# Patient Record
Sex: Female | Born: 1935 | Race: White | Hispanic: No | Smoking: Never smoker
Health system: Southern US, Community
[De-identification: ages and names within clinical notes are randomized; demographics above are authoritative.]

## PROBLEM LIST (undated history)

## (undated) DIAGNOSIS — F329 Major depressive disorder, single episode, unspecified: Secondary | ICD-10-CM

## (undated) DIAGNOSIS — Z860100 Personal history of colon polyps, unspecified: Secondary | ICD-10-CM

## (undated) DIAGNOSIS — E119 Type 2 diabetes mellitus without complications: Secondary | ICD-10-CM

## (undated) DIAGNOSIS — K219 Gastro-esophageal reflux disease without esophagitis: Secondary | ICD-10-CM

## (undated) DIAGNOSIS — E7800 Pure hypercholesterolemia, unspecified: Secondary | ICD-10-CM

## (undated) DIAGNOSIS — I739 Peripheral vascular disease, unspecified: Secondary | ICD-10-CM

## (undated) DIAGNOSIS — M109 Gout, unspecified: Secondary | ICD-10-CM

## (undated) DIAGNOSIS — E039 Hypothyroidism, unspecified: Secondary | ICD-10-CM

## (undated) DIAGNOSIS — J45909 Unspecified asthma, uncomplicated: Secondary | ICD-10-CM

## (undated) DIAGNOSIS — S42309A Unspecified fracture of shaft of humerus, unspecified arm, initial encounter for closed fracture: Secondary | ICD-10-CM

## (undated) DIAGNOSIS — F32A Depression, unspecified: Secondary | ICD-10-CM

## (undated) DIAGNOSIS — G4730 Sleep apnea, unspecified: Secondary | ICD-10-CM

## (undated) DIAGNOSIS — I10 Essential (primary) hypertension: Secondary | ICD-10-CM

## (undated) DIAGNOSIS — J449 Chronic obstructive pulmonary disease, unspecified: Secondary | ICD-10-CM

## (undated) DIAGNOSIS — J42 Unspecified chronic bronchitis: Secondary | ICD-10-CM

## (undated) DIAGNOSIS — Z87440 Personal history of urinary (tract) infections: Secondary | ICD-10-CM

## (undated) DIAGNOSIS — R319 Hematuria, unspecified: Secondary | ICD-10-CM

## (undated) HISTORY — DX: Personal history of colonic polyps: Z86.010

## (undated) HISTORY — DX: Gastro-esophageal reflux disease without esophagitis: K21.9

## (undated) HISTORY — PX: COLONOSCOPY W/ POLYPECTOMY: SHX1380

## (undated) HISTORY — DX: Unspecified fracture of shaft of humerus, unspecified arm, initial encounter for closed fracture: S42.309A

## (undated) HISTORY — DX: Type 2 diabetes mellitus without complications: E11.9

## (undated) HISTORY — PX: EYE SURGERY: SHX253

## (undated) HISTORY — DX: Pure hypercholesterolemia, unspecified: E78.00

## (undated) HISTORY — DX: Gout, unspecified: M10.9

## (undated) HISTORY — PX: BREAST BIOPSY: SHX20

## (undated) HISTORY — DX: Personal history of urinary (tract) infections: Z87.440

## (undated) HISTORY — DX: Hematuria, unspecified: R31.9

## (undated) HISTORY — DX: Peripheral vascular disease, unspecified: I73.9

## (undated) HISTORY — DX: Essential (primary) hypertension: I10

## (undated) HISTORY — DX: Personal history of colon polyps, unspecified: Z86.0100

---

## 1992-05-24 HISTORY — PX: CHOLECYSTECTOMY: SHX55

## 1999-12-08 HISTORY — PX: BLADDER SUSPENSION: SHX72

## 2001-02-09 LAB — HM COLONOSCOPY

## 2004-04-30 ENCOUNTER — Ambulatory Visit: Payer: Self-pay | Admitting: Internal Medicine

## 2005-06-09 ENCOUNTER — Ambulatory Visit: Payer: Self-pay | Admitting: Internal Medicine

## 2006-06-30 ENCOUNTER — Ambulatory Visit: Payer: Self-pay | Admitting: Internal Medicine

## 2006-08-13 ENCOUNTER — Emergency Department: Payer: Self-pay | Admitting: Emergency Medicine

## 2007-01-19 ENCOUNTER — Ambulatory Visit: Payer: Self-pay | Admitting: Specialist

## 2007-07-04 ENCOUNTER — Ambulatory Visit: Payer: Self-pay | Admitting: Internal Medicine

## 2007-12-05 ENCOUNTER — Ambulatory Visit: Payer: Self-pay | Admitting: Specialist

## 2008-01-23 DIAGNOSIS — S42309A Unspecified fracture of shaft of humerus, unspecified arm, initial encounter for closed fracture: Secondary | ICD-10-CM

## 2008-01-23 HISTORY — DX: Unspecified fracture of shaft of humerus, unspecified arm, initial encounter for closed fracture: S42.309A

## 2008-01-30 ENCOUNTER — Ambulatory Visit: Payer: Self-pay | Admitting: Internal Medicine

## 2008-07-25 ENCOUNTER — Ambulatory Visit: Payer: Self-pay | Admitting: Internal Medicine

## 2009-12-16 ENCOUNTER — Emergency Department: Payer: Self-pay | Admitting: Emergency Medicine

## 2010-03-03 ENCOUNTER — Ambulatory Visit: Payer: Self-pay | Admitting: Internal Medicine

## 2010-11-30 ENCOUNTER — Ambulatory Visit: Payer: Self-pay | Admitting: Internal Medicine

## 2011-03-22 DIAGNOSIS — N3946 Mixed incontinence: Secondary | ICD-10-CM | POA: Insufficient documentation

## 2011-03-22 DIAGNOSIS — N952 Postmenopausal atrophic vaginitis: Secondary | ICD-10-CM | POA: Insufficient documentation

## 2011-05-25 HISTORY — PX: KNEE SURGERY: SHX244

## 2011-05-31 LAB — HM PAP SMEAR: HM Pap smear: NORMAL

## 2011-06-01 ENCOUNTER — Ambulatory Visit: Payer: Self-pay | Admitting: Internal Medicine

## 2011-06-01 DIAGNOSIS — R928 Other abnormal and inconclusive findings on diagnostic imaging of breast: Secondary | ICD-10-CM | POA: Diagnosis not present

## 2011-06-01 DIAGNOSIS — Z1231 Encounter for screening mammogram for malignant neoplasm of breast: Secondary | ICD-10-CM | POA: Diagnosis not present

## 2011-06-03 DIAGNOSIS — R159 Full incontinence of feces: Secondary | ICD-10-CM | POA: Insufficient documentation

## 2011-06-03 DIAGNOSIS — N390 Urinary tract infection, site not specified: Secondary | ICD-10-CM | POA: Diagnosis not present

## 2011-06-03 DIAGNOSIS — R150 Incomplete defecation: Secondary | ICD-10-CM | POA: Diagnosis not present

## 2011-06-10 DIAGNOSIS — E7800 Pure hypercholesterolemia, unspecified: Secondary | ICD-10-CM | POA: Diagnosis not present

## 2011-06-10 DIAGNOSIS — N289 Disorder of kidney and ureter, unspecified: Secondary | ICD-10-CM | POA: Diagnosis not present

## 2011-06-10 DIAGNOSIS — Z124 Encounter for screening for malignant neoplasm of cervix: Secondary | ICD-10-CM | POA: Diagnosis not present

## 2011-06-10 DIAGNOSIS — Z0000 Encounter for general adult medical examination without abnormal findings: Secondary | ICD-10-CM | POA: Diagnosis not present

## 2011-06-10 DIAGNOSIS — R0602 Shortness of breath: Secondary | ICD-10-CM | POA: Diagnosis not present

## 2011-06-10 DIAGNOSIS — E119 Type 2 diabetes mellitus without complications: Secondary | ICD-10-CM | POA: Diagnosis not present

## 2011-06-10 DIAGNOSIS — I10 Essential (primary) hypertension: Secondary | ICD-10-CM | POA: Diagnosis not present

## 2011-06-14 ENCOUNTER — Ambulatory Visit: Payer: Self-pay | Admitting: Internal Medicine

## 2011-06-14 DIAGNOSIS — R928 Other abnormal and inconclusive findings on diagnostic imaging of breast: Secondary | ICD-10-CM | POA: Diagnosis not present

## 2011-06-14 DIAGNOSIS — N6009 Solitary cyst of unspecified breast: Secondary | ICD-10-CM | POA: Diagnosis not present

## 2011-06-17 DIAGNOSIS — R0602 Shortness of breath: Secondary | ICD-10-CM | POA: Diagnosis not present

## 2011-06-24 DIAGNOSIS — I10 Essential (primary) hypertension: Secondary | ICD-10-CM | POA: Diagnosis not present

## 2011-06-24 DIAGNOSIS — R05 Cough: Secondary | ICD-10-CM | POA: Diagnosis not present

## 2011-06-24 DIAGNOSIS — J449 Chronic obstructive pulmonary disease, unspecified: Secondary | ICD-10-CM | POA: Diagnosis not present

## 2011-06-24 DIAGNOSIS — J069 Acute upper respiratory infection, unspecified: Secondary | ICD-10-CM | POA: Diagnosis not present

## 2011-06-28 DIAGNOSIS — Z1211 Encounter for screening for malignant neoplasm of colon: Secondary | ICD-10-CM | POA: Diagnosis not present

## 2011-07-13 DIAGNOSIS — R0602 Shortness of breath: Secondary | ICD-10-CM | POA: Diagnosis not present

## 2011-07-13 DIAGNOSIS — R05 Cough: Secondary | ICD-10-CM | POA: Diagnosis not present

## 2011-07-13 DIAGNOSIS — J45909 Unspecified asthma, uncomplicated: Secondary | ICD-10-CM | POA: Diagnosis not present

## 2011-07-27 DIAGNOSIS — R498 Other voice and resonance disorders: Secondary | ICD-10-CM | POA: Diagnosis not present

## 2011-07-27 DIAGNOSIS — J45909 Unspecified asthma, uncomplicated: Secondary | ICD-10-CM | POA: Diagnosis not present

## 2011-07-27 DIAGNOSIS — J310 Chronic rhinitis: Secondary | ICD-10-CM | POA: Diagnosis not present

## 2011-08-02 DIAGNOSIS — R3989 Other symptoms and signs involving the genitourinary system: Secondary | ICD-10-CM | POA: Diagnosis not present

## 2011-08-02 DIAGNOSIS — N390 Urinary tract infection, site not specified: Secondary | ICD-10-CM | POA: Diagnosis not present

## 2011-08-02 DIAGNOSIS — Z87440 Personal history of urinary (tract) infections: Secondary | ICD-10-CM | POA: Diagnosis not present

## 2011-09-09 DIAGNOSIS — E7800 Pure hypercholesterolemia, unspecified: Secondary | ICD-10-CM | POA: Diagnosis not present

## 2011-09-09 DIAGNOSIS — D510 Vitamin B12 deficiency anemia due to intrinsic factor deficiency: Secondary | ICD-10-CM | POA: Diagnosis not present

## 2011-09-09 DIAGNOSIS — E039 Hypothyroidism, unspecified: Secondary | ICD-10-CM | POA: Diagnosis not present

## 2011-09-09 DIAGNOSIS — E119 Type 2 diabetes mellitus without complications: Secondary | ICD-10-CM | POA: Diagnosis not present

## 2011-09-09 DIAGNOSIS — I10 Essential (primary) hypertension: Secondary | ICD-10-CM | POA: Diagnosis not present

## 2011-09-10 DIAGNOSIS — E119 Type 2 diabetes mellitus without complications: Secondary | ICD-10-CM | POA: Diagnosis not present

## 2011-09-16 DIAGNOSIS — N289 Disorder of kidney and ureter, unspecified: Secondary | ICD-10-CM | POA: Diagnosis not present

## 2011-09-16 DIAGNOSIS — M109 Gout, unspecified: Secondary | ICD-10-CM | POA: Diagnosis not present

## 2011-09-16 DIAGNOSIS — G609 Hereditary and idiopathic neuropathy, unspecified: Secondary | ICD-10-CM | POA: Diagnosis not present

## 2011-09-16 DIAGNOSIS — I10 Essential (primary) hypertension: Secondary | ICD-10-CM | POA: Diagnosis not present

## 2011-09-16 DIAGNOSIS — E7800 Pure hypercholesterolemia, unspecified: Secondary | ICD-10-CM | POA: Diagnosis not present

## 2011-10-12 DIAGNOSIS — M1710 Unilateral primary osteoarthritis, unspecified knee: Secondary | ICD-10-CM | POA: Diagnosis not present

## 2011-10-14 DIAGNOSIS — D510 Vitamin B12 deficiency anemia due to intrinsic factor deficiency: Secondary | ICD-10-CM | POA: Diagnosis not present

## 2011-10-28 DIAGNOSIS — M1710 Unilateral primary osteoarthritis, unspecified knee: Secondary | ICD-10-CM | POA: Diagnosis not present

## 2011-11-09 DIAGNOSIS — M1710 Unilateral primary osteoarthritis, unspecified knee: Secondary | ICD-10-CM | POA: Diagnosis not present

## 2011-11-11 ENCOUNTER — Ambulatory Visit: Payer: Self-pay | Admitting: Unknown Physician Specialty

## 2011-11-11 DIAGNOSIS — M25569 Pain in unspecified knee: Secondary | ICD-10-CM | POA: Diagnosis not present

## 2011-11-11 DIAGNOSIS — IMO0002 Reserved for concepts with insufficient information to code with codable children: Secondary | ICD-10-CM | POA: Diagnosis not present

## 2011-11-30 DIAGNOSIS — D510 Vitamin B12 deficiency anemia due to intrinsic factor deficiency: Secondary | ICD-10-CM | POA: Diagnosis not present

## 2011-12-15 ENCOUNTER — Ambulatory Visit: Payer: Self-pay | Admitting: Internal Medicine

## 2011-12-15 DIAGNOSIS — R928 Other abnormal and inconclusive findings on diagnostic imaging of breast: Secondary | ICD-10-CM | POA: Diagnosis not present

## 2012-01-04 DIAGNOSIS — D510 Vitamin B12 deficiency anemia due to intrinsic factor deficiency: Secondary | ICD-10-CM | POA: Diagnosis not present

## 2012-01-07 DIAGNOSIS — J45909 Unspecified asthma, uncomplicated: Secondary | ICD-10-CM | POA: Diagnosis not present

## 2012-01-07 DIAGNOSIS — Z01811 Encounter for preprocedural respiratory examination: Secondary | ICD-10-CM | POA: Diagnosis not present

## 2012-01-11 DIAGNOSIS — I10 Essential (primary) hypertension: Secondary | ICD-10-CM | POA: Diagnosis not present

## 2012-01-11 DIAGNOSIS — E7800 Pure hypercholesterolemia, unspecified: Secondary | ICD-10-CM | POA: Diagnosis not present

## 2012-01-11 DIAGNOSIS — Z79899 Other long term (current) drug therapy: Secondary | ICD-10-CM | POA: Diagnosis not present

## 2012-01-11 DIAGNOSIS — E119 Type 2 diabetes mellitus without complications: Secondary | ICD-10-CM | POA: Diagnosis not present

## 2012-01-13 ENCOUNTER — Ambulatory Visit: Payer: Self-pay | Admitting: Unknown Physician Specialty

## 2012-01-13 DIAGNOSIS — Z01810 Encounter for preprocedural cardiovascular examination: Secondary | ICD-10-CM | POA: Diagnosis not present

## 2012-01-13 DIAGNOSIS — Z01812 Encounter for preprocedural laboratory examination: Secondary | ICD-10-CM | POA: Diagnosis not present

## 2012-01-13 DIAGNOSIS — M25569 Pain in unspecified knee: Secondary | ICD-10-CM | POA: Diagnosis not present

## 2012-01-13 DIAGNOSIS — I10 Essential (primary) hypertension: Secondary | ICD-10-CM | POA: Diagnosis not present

## 2012-01-13 LAB — CBC
HCT: 38.6 % (ref 35.0–47.0)
MCH: 32.4 pg (ref 26.0–34.0)
MCHC: 32.6 g/dL (ref 32.0–36.0)
RDW: 14.7 % — ABNORMAL HIGH (ref 11.5–14.5)

## 2012-01-13 LAB — URINALYSIS, COMPLETE
Bacteria: NONE SEEN
Bilirubin,UR: NEGATIVE
Blood: NEGATIVE
Hyaline Cast: 1
Ketone: NEGATIVE
Ph: 6 (ref 4.5–8.0)
Protein: NEGATIVE
Specific Gravity: 1.014 (ref 1.003–1.030)
Squamous Epithelial: <1
WBC UR: 239 /HPF (ref 0–5)

## 2012-01-19 ENCOUNTER — Ambulatory Visit: Payer: Self-pay | Admitting: Unknown Physician Specialty

## 2012-01-19 DIAGNOSIS — R609 Edema, unspecified: Secondary | ICD-10-CM | POA: Diagnosis not present

## 2012-01-19 DIAGNOSIS — M545 Low back pain: Secondary | ICD-10-CM | POA: Diagnosis not present

## 2012-01-19 DIAGNOSIS — N289 Disorder of kidney and ureter, unspecified: Secondary | ICD-10-CM | POA: Diagnosis not present

## 2012-01-19 DIAGNOSIS — D510 Vitamin B12 deficiency anemia due to intrinsic factor deficiency: Secondary | ICD-10-CM | POA: Diagnosis not present

## 2012-01-19 DIAGNOSIS — I739 Peripheral vascular disease, unspecified: Secondary | ICD-10-CM | POA: Diagnosis not present

## 2012-01-19 DIAGNOSIS — M84469A Pathological fracture, unspecified tibia and fibula, initial encounter for fracture: Secondary | ICD-10-CM | POA: Diagnosis not present

## 2012-01-19 DIAGNOSIS — E119 Type 2 diabetes mellitus without complications: Secondary | ICD-10-CM | POA: Diagnosis not present

## 2012-01-19 DIAGNOSIS — M23329 Other meniscus derangements, posterior horn of medial meniscus, unspecified knee: Secondary | ICD-10-CM | POA: Diagnosis not present

## 2012-01-19 DIAGNOSIS — E7800 Pure hypercholesterolemia, unspecified: Secondary | ICD-10-CM | POA: Diagnosis not present

## 2012-01-19 DIAGNOSIS — I10 Essential (primary) hypertension: Secondary | ICD-10-CM | POA: Diagnosis not present

## 2012-01-19 DIAGNOSIS — K449 Diaphragmatic hernia without obstruction or gangrene: Secondary | ICD-10-CM | POA: Diagnosis not present

## 2012-01-19 DIAGNOSIS — M76899 Other specified enthesopathies of unspecified lower limb, excluding foot: Secondary | ICD-10-CM | POA: Diagnosis not present

## 2012-01-19 DIAGNOSIS — Z7983 Long term (current) use of bisphosphonates: Secondary | ICD-10-CM | POA: Diagnosis not present

## 2012-01-19 DIAGNOSIS — IMO0002 Reserved for concepts with insufficient information to code with codable children: Secondary | ICD-10-CM | POA: Diagnosis not present

## 2012-01-19 DIAGNOSIS — G4730 Sleep apnea, unspecified: Secondary | ICD-10-CM | POA: Diagnosis not present

## 2012-01-19 DIAGNOSIS — J45909 Unspecified asthma, uncomplicated: Secondary | ICD-10-CM | POA: Diagnosis not present

## 2012-01-19 DIAGNOSIS — Z8711 Personal history of peptic ulcer disease: Secondary | ICD-10-CM | POA: Diagnosis not present

## 2012-01-19 DIAGNOSIS — K219 Gastro-esophageal reflux disease without esophagitis: Secondary | ICD-10-CM | POA: Diagnosis not present

## 2012-01-27 DIAGNOSIS — M23329 Other meniscus derangements, posterior horn of medial meniscus, unspecified knee: Secondary | ICD-10-CM | POA: Diagnosis not present

## 2012-02-01 DIAGNOSIS — M23329 Other meniscus derangements, posterior horn of medial meniscus, unspecified knee: Secondary | ICD-10-CM | POA: Diagnosis not present

## 2012-02-08 DIAGNOSIS — Z23 Encounter for immunization: Secondary | ICD-10-CM | POA: Diagnosis not present

## 2012-02-08 DIAGNOSIS — D510 Vitamin B12 deficiency anemia due to intrinsic factor deficiency: Secondary | ICD-10-CM | POA: Diagnosis not present

## 2012-02-21 DIAGNOSIS — M23302 Other meniscus derangements, unspecified lateral meniscus, unspecified knee: Secondary | ICD-10-CM | POA: Diagnosis not present

## 2012-02-21 DIAGNOSIS — M159 Polyosteoarthritis, unspecified: Secondary | ICD-10-CM | POA: Diagnosis not present

## 2012-02-21 DIAGNOSIS — R262 Difficulty in walking, not elsewhere classified: Secondary | ICD-10-CM | POA: Diagnosis not present

## 2012-02-21 DIAGNOSIS — M25569 Pain in unspecified knee: Secondary | ICD-10-CM | POA: Diagnosis not present

## 2012-02-22 DIAGNOSIS — M159 Polyosteoarthritis, unspecified: Secondary | ICD-10-CM | POA: Diagnosis not present

## 2012-02-22 DIAGNOSIS — R262 Difficulty in walking, not elsewhere classified: Secondary | ICD-10-CM | POA: Diagnosis not present

## 2012-02-22 DIAGNOSIS — M25569 Pain in unspecified knee: Secondary | ICD-10-CM | POA: Diagnosis not present

## 2012-02-22 DIAGNOSIS — M23302 Other meniscus derangements, unspecified lateral meniscus, unspecified knee: Secondary | ICD-10-CM | POA: Diagnosis not present

## 2012-03-03 DIAGNOSIS — Z298 Encounter for other specified prophylactic measures: Secondary | ICD-10-CM | POA: Diagnosis not present

## 2012-03-03 DIAGNOSIS — Z87440 Personal history of urinary (tract) infections: Secondary | ICD-10-CM | POA: Diagnosis not present

## 2012-03-03 DIAGNOSIS — N390 Urinary tract infection, site not specified: Secondary | ICD-10-CM | POA: Diagnosis not present

## 2012-03-24 DIAGNOSIS — M25569 Pain in unspecified knee: Secondary | ICD-10-CM | POA: Diagnosis not present

## 2012-03-24 DIAGNOSIS — M159 Polyosteoarthritis, unspecified: Secondary | ICD-10-CM | POA: Diagnosis not present

## 2012-03-24 DIAGNOSIS — R262 Difficulty in walking, not elsewhere classified: Secondary | ICD-10-CM | POA: Diagnosis not present

## 2012-03-24 DIAGNOSIS — N390 Urinary tract infection, site not specified: Secondary | ICD-10-CM | POA: Diagnosis not present

## 2012-03-24 DIAGNOSIS — M23302 Other meniscus derangements, unspecified lateral meniscus, unspecified knee: Secondary | ICD-10-CM | POA: Diagnosis not present

## 2012-03-27 DIAGNOSIS — M25569 Pain in unspecified knee: Secondary | ICD-10-CM | POA: Diagnosis not present

## 2012-03-27 DIAGNOSIS — R262 Difficulty in walking, not elsewhere classified: Secondary | ICD-10-CM | POA: Diagnosis not present

## 2012-03-27 DIAGNOSIS — M159 Polyosteoarthritis, unspecified: Secondary | ICD-10-CM | POA: Diagnosis not present

## 2012-03-27 DIAGNOSIS — M23302 Other meniscus derangements, unspecified lateral meniscus, unspecified knee: Secondary | ICD-10-CM | POA: Diagnosis not present

## 2012-03-29 DIAGNOSIS — R262 Difficulty in walking, not elsewhere classified: Secondary | ICD-10-CM | POA: Diagnosis not present

## 2012-03-29 DIAGNOSIS — M23302 Other meniscus derangements, unspecified lateral meniscus, unspecified knee: Secondary | ICD-10-CM | POA: Diagnosis not present

## 2012-03-29 DIAGNOSIS — M25569 Pain in unspecified knee: Secondary | ICD-10-CM | POA: Diagnosis not present

## 2012-03-29 DIAGNOSIS — M159 Polyosteoarthritis, unspecified: Secondary | ICD-10-CM | POA: Diagnosis not present

## 2012-04-03 DIAGNOSIS — M23302 Other meniscus derangements, unspecified lateral meniscus, unspecified knee: Secondary | ICD-10-CM | POA: Diagnosis not present

## 2012-04-03 DIAGNOSIS — M25569 Pain in unspecified knee: Secondary | ICD-10-CM | POA: Diagnosis not present

## 2012-04-03 DIAGNOSIS — M159 Polyosteoarthritis, unspecified: Secondary | ICD-10-CM | POA: Diagnosis not present

## 2012-04-03 DIAGNOSIS — R262 Difficulty in walking, not elsewhere classified: Secondary | ICD-10-CM | POA: Diagnosis not present

## 2012-04-05 DIAGNOSIS — M25569 Pain in unspecified knee: Secondary | ICD-10-CM | POA: Diagnosis not present

## 2012-04-05 DIAGNOSIS — M159 Polyosteoarthritis, unspecified: Secondary | ICD-10-CM | POA: Diagnosis not present

## 2012-04-05 DIAGNOSIS — R262 Difficulty in walking, not elsewhere classified: Secondary | ICD-10-CM | POA: Diagnosis not present

## 2012-04-05 DIAGNOSIS — M23302 Other meniscus derangements, unspecified lateral meniscus, unspecified knee: Secondary | ICD-10-CM | POA: Diagnosis not present

## 2012-04-06 ENCOUNTER — Telehealth: Payer: Self-pay | Admitting: *Deleted

## 2012-04-06 NOTE — Telephone Encounter (Signed)
Called refill into pharmacy and left voice message on patients home phone.

## 2012-04-06 NOTE — Telephone Encounter (Signed)
Ok to refill simvastatin #30 with 2 refills.

## 2012-04-06 NOTE — Telephone Encounter (Signed)
Patient has a new patient appointment in January and her pharmacy is requesting a refill authorization for Simvastatin 20 mg.

## 2012-04-07 DIAGNOSIS — N390 Urinary tract infection, site not specified: Secondary | ICD-10-CM | POA: Diagnosis not present

## 2012-04-10 DIAGNOSIS — M23302 Other meniscus derangements, unspecified lateral meniscus, unspecified knee: Secondary | ICD-10-CM | POA: Diagnosis not present

## 2012-04-10 DIAGNOSIS — M25569 Pain in unspecified knee: Secondary | ICD-10-CM | POA: Diagnosis not present

## 2012-04-10 DIAGNOSIS — R262 Difficulty in walking, not elsewhere classified: Secondary | ICD-10-CM | POA: Diagnosis not present

## 2012-04-10 DIAGNOSIS — M159 Polyosteoarthritis, unspecified: Secondary | ICD-10-CM | POA: Diagnosis not present

## 2012-04-12 ENCOUNTER — Telehealth: Payer: Self-pay | Admitting: Internal Medicine

## 2012-04-12 DIAGNOSIS — M23302 Other meniscus derangements, unspecified lateral meniscus, unspecified knee: Secondary | ICD-10-CM | POA: Diagnosis not present

## 2012-04-12 DIAGNOSIS — M159 Polyosteoarthritis, unspecified: Secondary | ICD-10-CM | POA: Diagnosis not present

## 2012-04-12 DIAGNOSIS — R262 Difficulty in walking, not elsewhere classified: Secondary | ICD-10-CM | POA: Diagnosis not present

## 2012-04-12 DIAGNOSIS — M25569 Pain in unspecified knee: Secondary | ICD-10-CM | POA: Diagnosis not present

## 2012-04-12 NOTE — Telephone Encounter (Signed)
Refill request for montelukast sod 10 mg tab Sig: take 1 tablet by mouth every night Patient has an appointment on 05/25/12

## 2012-04-17 ENCOUNTER — Other Ambulatory Visit: Payer: Self-pay | Admitting: *Deleted

## 2012-04-18 MED ORDER — MONTELUKAST SODIUM 10 MG PO TABS: 10.0000 mg | ORAL_TABLET | Freq: Every day | ORAL | Status: DC

## 2012-04-18 MED ORDER — SIMVASTATIN 20 MG PO TABS: 20.0000 mg | ORAL_TABLET | Freq: Every day | ORAL | Status: DC

## 2012-04-18 NOTE — Telephone Encounter (Signed)
Sent in to pharmacy.  

## 2012-05-08 DIAGNOSIS — H26499 Other secondary cataract, unspecified eye: Secondary | ICD-10-CM | POA: Diagnosis not present

## 2012-05-08 DIAGNOSIS — H10439 Chronic follicular conjunctivitis, unspecified eye: Secondary | ICD-10-CM | POA: Diagnosis not present

## 2012-05-08 DIAGNOSIS — N390 Urinary tract infection, site not specified: Secondary | ICD-10-CM | POA: Diagnosis not present

## 2012-05-09 DIAGNOSIS — J45909 Unspecified asthma, uncomplicated: Secondary | ICD-10-CM | POA: Diagnosis not present

## 2012-05-11 DIAGNOSIS — M23329 Other meniscus derangements, posterior horn of medial meniscus, unspecified knee: Secondary | ICD-10-CM | POA: Diagnosis not present

## 2012-05-25 ENCOUNTER — Encounter: Payer: Self-pay | Admitting: Internal Medicine

## 2012-05-25 ENCOUNTER — Ambulatory Visit (INDEPENDENT_AMBULATORY_CARE_PROVIDER_SITE_OTHER): Payer: Medicare Other | Admitting: Internal Medicine

## 2012-05-25 VITALS — BP 130/70 | HR 83 | Temp 98.2°F | Ht 66.5 in | Wt 202.0 lb

## 2012-05-25 DIAGNOSIS — E1151 Type 2 diabetes mellitus with diabetic peripheral angiopathy without gangrene: Secondary | ICD-10-CM | POA: Insufficient documentation

## 2012-05-25 DIAGNOSIS — E538 Deficiency of other specified B group vitamins: Secondary | ICD-10-CM

## 2012-05-25 DIAGNOSIS — E559 Vitamin D deficiency, unspecified: Secondary | ICD-10-CM

## 2012-05-25 DIAGNOSIS — E7800 Pure hypercholesterolemia, unspecified: Secondary | ICD-10-CM | POA: Insufficient documentation

## 2012-05-25 DIAGNOSIS — E119 Type 2 diabetes mellitus without complications: Secondary | ICD-10-CM

## 2012-05-25 DIAGNOSIS — M109 Gout, unspecified: Secondary | ICD-10-CM

## 2012-05-25 DIAGNOSIS — I10 Essential (primary) hypertension: Secondary | ICD-10-CM

## 2012-05-25 DIAGNOSIS — I739 Peripheral vascular disease, unspecified: Secondary | ICD-10-CM

## 2012-05-25 DIAGNOSIS — K219 Gastro-esophageal reflux disease without esophagitis: Secondary | ICD-10-CM

## 2012-05-25 DIAGNOSIS — Z87440 Personal history of urinary (tract) infections: Secondary | ICD-10-CM

## 2012-05-25 DIAGNOSIS — R5383 Other fatigue: Secondary | ICD-10-CM

## 2012-05-25 DIAGNOSIS — R5381 Other malaise: Secondary | ICD-10-CM

## 2012-05-25 DIAGNOSIS — Z23 Encounter for immunization: Secondary | ICD-10-CM

## 2012-05-25 DIAGNOSIS — M899 Disorder of bone, unspecified: Secondary | ICD-10-CM

## 2012-05-25 DIAGNOSIS — M8580 Other specified disorders of bone density and structure, unspecified site: Secondary | ICD-10-CM

## 2012-05-25 MED ORDER — CYANOCOBALAMIN 1000 MCG/ML IJ SOLN
1000.0000 ug | Freq: Once | INTRAMUSCULAR | Status: AC
  Administered 2012-05-25: 1000 ug via INTRAMUSCULAR

## 2012-05-26 ENCOUNTER — Other Ambulatory Visit (INDEPENDENT_AMBULATORY_CARE_PROVIDER_SITE_OTHER): Payer: Medicare Other

## 2012-05-26 DIAGNOSIS — I10 Essential (primary) hypertension: Secondary | ICD-10-CM | POA: Diagnosis not present

## 2012-05-26 DIAGNOSIS — R5381 Other malaise: Secondary | ICD-10-CM | POA: Diagnosis not present

## 2012-05-26 DIAGNOSIS — E119 Type 2 diabetes mellitus without complications: Secondary | ICD-10-CM

## 2012-05-26 DIAGNOSIS — E7800 Pure hypercholesterolemia, unspecified: Secondary | ICD-10-CM | POA: Diagnosis not present

## 2012-05-26 DIAGNOSIS — R5383 Other fatigue: Secondary | ICD-10-CM

## 2012-05-26 LAB — CBC WITH DIFFERENTIAL/PLATELET
Basophils Absolute: 0 10*3/uL (ref 0.0–0.1)
Basophils Relative: 0.4 % (ref 0.0–3.0)
Eosinophils Absolute: 0.5 10*3/uL (ref 0.0–0.7)
MCHC: 32.9 g/dL (ref 30.0–36.0)
MCV: 95 fl (ref 78.0–100.0)
Monocytes Absolute: 0.6 10*3/uL (ref 0.1–1.0)
Neutro Abs: 5.6 10*3/uL (ref 1.4–7.7)
Neutrophils Relative %: 57.9 % (ref 43.0–77.0)
RBC: 4.08 Mil/uL (ref 3.87–5.11)
RDW: 14.7 % — ABNORMAL HIGH (ref 11.5–14.6)

## 2012-05-26 LAB — HEPATIC FUNCTION PANEL
ALT: 21 U/L (ref 0–35)
AST: 32 U/L (ref 0–37)
Alkaline Phosphatase: 72 U/L (ref 39–117)
Bilirubin, Direct: 0.1 mg/dL (ref 0.0–0.3)
Total Bilirubin: 0.9 mg/dL (ref 0.3–1.2)

## 2012-05-26 LAB — BASIC METABOLIC PANEL
BUN: 14 mg/dL (ref 6–23)
Creatinine, Ser: 1.2 mg/dL (ref 0.4–1.2)
GFR: 47.83 mL/min — ABNORMAL LOW (ref >60.00)

## 2012-05-26 LAB — LIPID PANEL: Cholesterol: 123 mg/dL (ref 0–200)

## 2012-05-26 LAB — HEMOGLOBIN A1C: Hgb A1c MFr Bld: 6.7 % — ABNORMAL HIGH (ref 4.6–6.5)

## 2012-05-27 ENCOUNTER — Encounter: Payer: Self-pay | Admitting: Internal Medicine

## 2012-05-27 DIAGNOSIS — E559 Vitamin D deficiency, unspecified: Secondary | ICD-10-CM | POA: Insufficient documentation

## 2012-05-27 DIAGNOSIS — M8580 Other specified disorders of bone density and structure, unspecified site: Secondary | ICD-10-CM | POA: Insufficient documentation

## 2012-05-27 NOTE — Assessment & Plan Note (Signed)
Continue calcium and vitamin D 

## 2012-05-27 NOTE — Assessment & Plan Note (Addendum)
Asymptomatic.  Varying pressures in each arm.

## 2012-05-27 NOTE — Progress Notes (Signed)
Subjective:    [name], female    DOB: 1927/02/27, 79 y.o.   MRN: [id number]  HPI 79 year old female with past history of colonic polyps, vascular disease, hypertension, reoccurring urinary tract infections and hypercholesterolemia who comes in today for a scheduled follow up.  She had knee surgery 8/13 (by Dr Erin Sons).  Had therapy.  Doing well.  He discharged her last week.  Needs to restart her B12 shots.  She has been on several rounds of abx recently for urinary tract infections.  She is currently without symptoms.  Just finished Levaquin.  Has follow up with her urologist (Dr Ashley Royalty) in 2/14.  No cardiac symptoms with increased activity or exertion.  Breathing stable.  Bowels stable.  No abdominal pain or cramping.  Some fatigue.    Past Medical History  Diagnosis Date  . Hypertension   . Hypercholesterolemia   . Peripheral vascular disease   . GERD (gastroesophageal reflux disease)     hiatal hernia  . History of frequent urinary tract infections   . Diabetes mellitus   . History of colon polyps   . Hematuria     followed by urology  . Gout   . Humeral fracture 9/09    comminuted impacted proximal    Current Outpatient Prescriptions on File Prior to Visit  Medication Sig Dispense Refill  . allopurinol (ZYLOPRIM) 100 MG tablet Take 100 mg by mouth daily.      . Calcium Carbonate-Vitamin D (CALCIUM 600+D) 600-400 MG-UNIT per tablet Take 1 tablet by mouth daily.       . cetirizine (ZYRTEC) 10 MG tablet Take 10 mg by mouth daily.      Marland Kitchen esomeprazole (NEXIUM) 40 MG capsule Take 40 mg by mouth daily before breakfast.      . FLUoxetine (PROZAC) 20 MG capsule Take 20 mg by mouth daily.      . Fluticasone-Salmeterol (ADVAIR) 100-50 MCG/DOSE AEPB Inhale 2 puffs into the lungs 2 (two) times daily.       Marland Kitchen levothyroxine (SYNTHROID) 50 MCG tablet Take 50 mcg by mouth daily.      Marland Kitchen losartan (COZAAR) 100 MG tablet Take 100 mg by mouth daily.      . montelukast  (SINGULAIR) 10 MG tablet Take 1 tablet (10 mg total) by mouth at bedtime.  30 tablet  1  . simvastatin (ZOCOR) 20 MG tablet Take 1 tablet (20 mg total) by mouth daily.  30 tablet  1    Review of Systems Patient denies any headache, lightheadedness or dizziness.  No significant sinus or allergy symptoms.  No chest pain, tightness or palpitations.  No increased shortness of breath, cough or congestion.  Breathing stable.  No nausea or vomiting.  No abdominal pain or cramping.  No bowel change, such as diarrhea, constipation, BRBPR or melana.  No urinary symptoms now.  Feeling better.  Some fatigue.        Objective:   Physical Exam Filed Vitals:   05/25/12 1447  BP: 130/70  Pulse: 83  Temp: 98.2 F (37.42 C)   79 year old female in no acute distress.   HEENT:  Nares - clear.  OP- without lesions or erythema.  NECK:  Supple, nontender.  No audible bruit.   HEART:  Appears to be regular. LUNGS:  Without crackles or wheezing audible.  Respirations even and unlabored.  ABDOMEN:  Soft, nontender.  No audible abdominal bruit.   EXTREMITIES:  No increased edema to be  present.                    Assessment & Plan:  MSK.  S/p knee surgery.  Has been released.  Doing well.  Follow.    PULMONARY.  Breathing stable.    CARDIOVASCULAR.  Stable.    LOWER EXTREMITY SWELLING.  Felt to be related to venostasis.  Compression stockings.  Elevate legs.  Stable.    HEALTH MAINTENANCE.  Physical 06/10/11.  Colonoscopy 9/02 revealed diverticulosis.  Saw GI and they felt no further w/up warranted.  Previous bone density revealed osteopenia.

## 2012-05-27 NOTE — Assessment & Plan Note (Signed)
Symptoms controlled.  Follow.   

## 2012-05-27 NOTE — Assessment & Plan Note (Signed)
Saw Dr Cline.  On Allopurinol.    

## 2012-05-27 NOTE — Assessment & Plan Note (Signed)
Low carb diet and exercise.  Follow.  Check met b and a1c.    

## 2012-05-27 NOTE — Assessment & Plan Note (Signed)
Continue simvastatin.  Low cholesterol diet.  Check lipid panel and liver function.

## 2012-05-27 NOTE — Assessment & Plan Note (Signed)
Recently on multiple rounds of abx.  Currently asymptomatic.  Will hold on repeat urine since asymptomatic.

## 2012-05-27 NOTE — Assessment & Plan Note (Signed)
Blood pressure under good control.  Same medication regimen.  Check metabolic panel.    

## 2012-05-27 NOTE — Assessment & Plan Note (Signed)
On replacement.  Follow.  

## 2012-06-13 DIAGNOSIS — R3989 Other symptoms and signs involving the genitourinary system: Secondary | ICD-10-CM | POA: Diagnosis not present

## 2012-06-13 DIAGNOSIS — N390 Urinary tract infection, site not specified: Secondary | ICD-10-CM | POA: Diagnosis not present

## 2012-06-22 DIAGNOSIS — Z23 Encounter for immunization: Secondary | ICD-10-CM

## 2012-06-26 ENCOUNTER — Ambulatory Visit: Payer: Medicare Other

## 2012-06-27 DIAGNOSIS — N3946 Mixed incontinence: Secondary | ICD-10-CM | POA: Diagnosis not present

## 2012-06-27 DIAGNOSIS — R3989 Other symptoms and signs involving the genitourinary system: Secondary | ICD-10-CM | POA: Diagnosis not present

## 2012-06-27 DIAGNOSIS — R3915 Urgency of urination: Secondary | ICD-10-CM | POA: Diagnosis not present

## 2012-06-27 DIAGNOSIS — N952 Postmenopausal atrophic vaginitis: Secondary | ICD-10-CM | POA: Diagnosis not present

## 2012-06-27 DIAGNOSIS — N390 Urinary tract infection, site not specified: Secondary | ICD-10-CM | POA: Diagnosis not present

## 2012-06-28 ENCOUNTER — Ambulatory Visit: Payer: Medicare Other

## 2012-07-14 DIAGNOSIS — N390 Urinary tract infection, site not specified: Secondary | ICD-10-CM | POA: Diagnosis not present

## 2012-07-14 DIAGNOSIS — Z87440 Personal history of urinary (tract) infections: Secondary | ICD-10-CM | POA: Diagnosis not present

## 2012-07-18 ENCOUNTER — Ambulatory Visit (INDEPENDENT_AMBULATORY_CARE_PROVIDER_SITE_OTHER): Payer: Medicare Other | Admitting: *Deleted

## 2012-07-18 DIAGNOSIS — E538 Deficiency of other specified B group vitamins: Secondary | ICD-10-CM

## 2012-07-18 MED ORDER — CYANOCOBALAMIN 1000 MCG/ML IJ SOLN
1000.0000 ug | Freq: Once | INTRAMUSCULAR | Status: AC
  Administered 2012-07-18: 1000 ug via INTRAMUSCULAR

## 2012-07-19 ENCOUNTER — Ambulatory Visit: Payer: Medicare Other

## 2012-07-19 DIAGNOSIS — N269 Renal sclerosis, unspecified: Secondary | ICD-10-CM | POA: Diagnosis not present

## 2012-07-19 DIAGNOSIS — N329 Bladder disorder, unspecified: Secondary | ICD-10-CM | POA: Diagnosis not present

## 2012-07-19 DIAGNOSIS — N1330 Unspecified hydronephrosis: Secondary | ICD-10-CM | POA: Diagnosis not present

## 2012-07-19 DIAGNOSIS — Z87440 Personal history of urinary (tract) infections: Secondary | ICD-10-CM | POA: Diagnosis not present

## 2012-07-21 DIAGNOSIS — N390 Urinary tract infection, site not specified: Secondary | ICD-10-CM | POA: Diagnosis not present

## 2012-07-28 DIAGNOSIS — R3915 Urgency of urination: Secondary | ICD-10-CM | POA: Diagnosis not present

## 2012-07-28 DIAGNOSIS — Z87440 Personal history of urinary (tract) infections: Secondary | ICD-10-CM | POA: Diagnosis not present

## 2012-07-28 DIAGNOSIS — N289 Disorder of kidney and ureter, unspecified: Secondary | ICD-10-CM | POA: Insufficient documentation

## 2012-08-02 ENCOUNTER — Other Ambulatory Visit: Payer: Self-pay | Admitting: *Deleted

## 2012-08-02 ENCOUNTER — Telehealth: Payer: Self-pay | Admitting: Internal Medicine

## 2012-08-02 NOTE — Telephone Encounter (Signed)
i am ok with this as long as we do have the contact and can forward for them to treat.

## 2012-08-02 NOTE — Telephone Encounter (Signed)
I called Dr. Mauricia Area office to find out what was going on. Urology office said that patient wants to come by here and leave a urine sample because it closer to home. Then fax results to Dr. Leatrice Jewels, atten: Darlyn Chamber fax# [phone]. (p) [phone]. I was making sure that this is all right with you?

## 2012-08-02 NOTE — Telephone Encounter (Signed)
Patient calling to see if you received an order to leave a urine specimen from Dr. Leatrice Jewels at [facility] [place] .# [phone]

## 2012-08-03 NOTE — Telephone Encounter (Signed)
Left message for patient to return call.

## 2012-08-04 NOTE — Telephone Encounter (Signed)
Patient said that she would like to come by today to leave sample.

## 2012-08-04 NOTE — Telephone Encounter (Signed)
Patient returned your call. Please call at [phone]

## 2012-08-07 ENCOUNTER — Other Ambulatory Visit: Payer: Medicare Other

## 2012-08-09 ENCOUNTER — Other Ambulatory Visit (INDEPENDENT_AMBULATORY_CARE_PROVIDER_SITE_OTHER): Payer: Medicare Other

## 2012-08-09 DIAGNOSIS — N390 Urinary tract infection, site not specified: Secondary | ICD-10-CM

## 2012-08-09 LAB — POCT URINALYSIS DIPSTICK
Glucose, UA: NEGATIVE
Nitrite, UA: NEGATIVE
Urobilinogen, UA: 0.2

## 2012-08-11 LAB — URINE CULTURE: Colony Count: 70000

## 2012-08-14 ENCOUNTER — Encounter: Payer: Self-pay | Admitting: Internal Medicine

## 2012-08-14 ENCOUNTER — Telehealth: Payer: Self-pay | Admitting: Internal Medicine

## 2012-08-14 ENCOUNTER — Ambulatory Visit (INDEPENDENT_AMBULATORY_CARE_PROVIDER_SITE_OTHER): Payer: Medicare Other | Admitting: Internal Medicine

## 2012-08-14 VITALS — BP 108/80 | HR 93 | Temp 98.6°F | Ht 66.5 in | Wt 194.0 lb

## 2012-08-14 DIAGNOSIS — R29898 Other symptoms and signs involving the musculoskeletal system: Secondary | ICD-10-CM | POA: Diagnosis not present

## 2012-08-14 DIAGNOSIS — I10 Essential (primary) hypertension: Secondary | ICD-10-CM

## 2012-08-14 DIAGNOSIS — E7800 Pure hypercholesterolemia, unspecified: Secondary | ICD-10-CM

## 2012-08-14 DIAGNOSIS — E119 Type 2 diabetes mellitus without complications: Secondary | ICD-10-CM

## 2012-08-14 DIAGNOSIS — K219 Gastro-esophageal reflux disease without esophagitis: Secondary | ICD-10-CM | POA: Diagnosis not present

## 2012-08-14 DIAGNOSIS — E538 Deficiency of other specified B group vitamins: Secondary | ICD-10-CM

## 2012-08-14 DIAGNOSIS — Z87440 Personal history of urinary (tract) infections: Secondary | ICD-10-CM

## 2012-08-14 DIAGNOSIS — I739 Peripheral vascular disease, unspecified: Secondary | ICD-10-CM

## 2012-08-14 DIAGNOSIS — R5383 Other fatigue: Secondary | ICD-10-CM

## 2012-08-14 DIAGNOSIS — M899 Disorder of bone, unspecified: Secondary | ICD-10-CM

## 2012-08-14 DIAGNOSIS — Z1239 Encounter for other screening for malignant neoplasm of breast: Secondary | ICD-10-CM

## 2012-08-14 DIAGNOSIS — M8580 Other specified disorders of bone density and structure, unspecified site: Secondary | ICD-10-CM

## 2012-08-14 DIAGNOSIS — M109 Gout, unspecified: Secondary | ICD-10-CM

## 2012-08-14 DIAGNOSIS — E559 Vitamin D deficiency, unspecified: Secondary | ICD-10-CM

## 2012-08-14 NOTE — Assessment & Plan Note (Signed)
Recently on multiple rounds of abx.  Currently asymptomatic.  On keflex.  Continues to follow up with urology.

## 2012-08-14 NOTE — Progress Notes (Signed)
Subjective:    [name], female    DOB: 1926-11-19, 79 y.o.   MRN: [id number]  HPI 79 year old female with past history of colonic polyps, vascular disease, hypertension, reoccurring urinary tract infections and hypercholesterolemia who comes in today to follow up on these issues as well as for a complete physical exam.  She had knee surgery 8/13 (by Dr Erin Sons).  Had therapy.  Did well initially.  She did her exercises at home for a while, but is not doing them regularly now.  Some weakness in her legs.  She has been on several rounds of abx recently for urinary tract infections.  She is currently without symptoms.  On keflex now.  No cardiac symptoms with increased activity or exertion.  Breathing stable.  Bowels stable.  No abdominal pain or cramping.  Some fatigue.     Past Medical History  Diagnosis Date  . Hypertension   . Hypercholesterolemia   . Peripheral vascular disease   . GERD (gastroesophageal reflux disease)     hiatal hernia  . History of frequent urinary tract infections   . Diabetes mellitus   . History of colon polyps   . Hematuria     followed by urology  . Gout   . Humeral fracture 9/09    comminuted impacted proximal    Current Outpatient Prescriptions on File Prior to Visit  Medication Sig Dispense Refill  . allopurinol (ZYLOPRIM) 100 MG tablet Take 100 mg by mouth daily.      . Calcium Carbonate-Vitamin D (CALCIUM 600+D) 600-400 MG-UNIT per tablet Take 1 tablet by mouth daily.       . cetirizine (ZYRTEC) 10 MG tablet Take 10 mg by mouth daily.      . Cholecalciferol (VITAMIN D-3) 1000 UNITS CAPS Take 1 capsule by mouth daily.      . Cranberry-Cholecalciferol 4200-500 MG-UNIT CAPS Take by mouth.      . Cyanocobalamin 1000 MCG/ML KIT Inject as directed every 30 (thirty) days.      Marland Kitchen doxycycline (ORACEA) 40 MG capsule Take 40 mg by mouth daily.      Marland Kitchen esomeprazole (NEXIUM) 40 MG capsule Take 40 mg by mouth daily before breakfast.      .  FLUoxetine (PROZAC) 20 MG capsule Take 20 mg by mouth daily.      . Fluticasone-Salmeterol (ADVAIR) 100-50 MCG/DOSE AEPB Inhale 2 puffs into the lungs 2 (two) times daily.       Marland Kitchen levothyroxine (SYNTHROID) 50 MCG tablet Take 50 mcg by mouth daily.      Marland Kitchen losartan (COZAAR) 100 MG tablet Take 100 mg by mouth daily.      . montelukast (SINGULAIR) 10 MG tablet Take 1 tablet (10 mg total) by mouth at bedtime.  30 tablet  1  . simvastatin (ZOCOR) 20 MG tablet Take 1 tablet (20 mg total) by mouth daily.  30 tablet  1  . solifenacin (VESICARE) 5 MG tablet 1 tablet qod       No current facility-administered medications on file prior to visit.    Review of Systems Patient denies any headache, lightheadedness or dizziness.  No significant sinus or allergy symptoms.  No chest pain, tightness or palpitations.  No increased shortness of breath, cough or congestion.  Breathing stable.  No nausea or vomiting.  No abdominal pain or cramping.  No bowel change, such as diarrhea, constipation, BRBPR or melana.  No urinary symptoms now.  On keflex.  Seeing Dr Casilda Carls  for her bladder issues.  Using vaginal cream.         Objective:   Physical Exam  Filed Vitals:   08/14/12 1553  BP: 108/80  Pulse: 93  Temp: 98.6 F (37 C)   Blood pressure recheck:  120/72, pulse 72 - right arm.  110/6- left arm  79 year old female in no acute distress.   HEENT:  Nares- clear.  Oropharynx - without lesions. NECK:  Supple.  Nontender.  No audible bruit.  HEART:  Appears to be regular. LUNGS:  No crackles or wheezing audible.  Respirations even and unlabored.  RADIAL PULSE:  Equal bilaterally.    BREASTS:  No nipple discharge or nipple retraction present.  Could not appreciate any distinct nodules or axillary adenopathy.  ABDOMEN:  Soft, nontender.  Bowel sounds present and normal.  No audible abdominal bruit.  GU:  Pt declined.   EXTREMITIES:  Some lower extremity edema (left slightly greater then right).   DP pulses  palpable and equal bilaterally.  Some stasis changes.  No infection.        Assessment & Plan:  MSK.  S/p knee surgery.  Has been released.   LEG WEAKNESS.  See above.  Will refer back to physical therapy for evaluation and treatment - for strengthening.     PULMONARY.  Breathing stable.    CARDIOVASCULAR.  Stable.    LOWER EXTREMITY SWELLING.  Felt to be related to venostasis.  Compression stockings.  Elevate legs.  Discussed referral to vascular surgery.  She wants to hold at this point.  Follow.   HEALTH MAINTENANCE.  Physical today.  Colonoscopy 9/02 revealed diverticulosis.  Saw GI and they felt no further w/up warranted.  Previous bone density revealed osteopenia.  Schedule mammogram.

## 2012-08-14 NOTE — Assessment & Plan Note (Signed)
Need to make sure receiving B12 injections.

## 2012-08-14 NOTE — Assessment & Plan Note (Signed)
Saw Dr Cline.  On Allopurinol.    

## 2012-08-14 NOTE — Assessment & Plan Note (Addendum)
Asymptomatic.  Varying pressures in each arm.  Stable.   

## 2012-08-14 NOTE — Assessment & Plan Note (Signed)
Symptoms controlled.  Follow.   

## 2012-08-14 NOTE — Assessment & Plan Note (Signed)
On replacement.  Follow.  

## 2012-08-14 NOTE — Telephone Encounter (Signed)
Please schedule pt for fasting labs 1-2 days before her next appt.  Please let her know when appt is scheduled.  Thanks.

## 2012-08-14 NOTE — Assessment & Plan Note (Addendum)
Low carb diet and exercise.  Follow.  Check met b and a1c with next labs.  Last a1c 6.7.

## 2012-08-14 NOTE — Assessment & Plan Note (Signed)
Continue calcium and vitamin D 

## 2012-08-14 NOTE — Assessment & Plan Note (Addendum)
Blood pressure under good control.  Same medication regimen.  Follow metabolic panel.  Last Cr 05/26/12 - 1.2.

## 2012-08-14 NOTE — Assessment & Plan Note (Signed)
Continue simvastatin.  Low cholesterol diet.  Follow lipid panel and liver function.   

## 2012-08-15 NOTE — Telephone Encounter (Signed)
Patient called back her labs have been scheduled before her next appointment.

## 2012-08-15 NOTE — Telephone Encounter (Signed)
Left message asking pt to call office.  Please schedule lab appointment

## 2012-08-16 DIAGNOSIS — Z01810 Encounter for preprocedural cardiovascular examination: Secondary | ICD-10-CM | POA: Diagnosis not present

## 2012-08-16 DIAGNOSIS — M109 Gout, unspecified: Secondary | ICD-10-CM | POA: Diagnosis not present

## 2012-08-16 DIAGNOSIS — E039 Hypothyroidism, unspecified: Secondary | ICD-10-CM | POA: Diagnosis not present

## 2012-08-16 DIAGNOSIS — Z87440 Personal history of urinary (tract) infections: Secondary | ICD-10-CM | POA: Diagnosis not present

## 2012-08-16 DIAGNOSIS — IMO0002 Reserved for concepts with insufficient information to code with codable children: Secondary | ICD-10-CM | POA: Diagnosis not present

## 2012-08-16 DIAGNOSIS — K219 Gastro-esophageal reflux disease without esophagitis: Secondary | ICD-10-CM | POA: Diagnosis not present

## 2012-08-16 DIAGNOSIS — Z79899 Other long term (current) drug therapy: Secondary | ICD-10-CM | POA: Diagnosis not present

## 2012-08-16 DIAGNOSIS — I4460 Unspecified fascicular block: Secondary | ICD-10-CM | POA: Diagnosis not present

## 2012-08-16 DIAGNOSIS — N3289 Other specified disorders of bladder: Secondary | ICD-10-CM | POA: Diagnosis not present

## 2012-08-16 DIAGNOSIS — I491 Atrial premature depolarization: Secondary | ICD-10-CM | POA: Diagnosis not present

## 2012-08-16 DIAGNOSIS — I10 Essential (primary) hypertension: Secondary | ICD-10-CM | POA: Diagnosis not present

## 2012-08-16 DIAGNOSIS — J45909 Unspecified asthma, uncomplicated: Secondary | ICD-10-CM | POA: Diagnosis not present

## 2012-08-16 DIAGNOSIS — Z01818 Encounter for other preprocedural examination: Secondary | ICD-10-CM | POA: Diagnosis not present

## 2012-08-16 DIAGNOSIS — N2889 Other specified disorders of kidney and ureter: Secondary | ICD-10-CM | POA: Diagnosis not present

## 2012-08-16 DIAGNOSIS — R32 Unspecified urinary incontinence: Secondary | ICD-10-CM | POA: Diagnosis not present

## 2012-08-16 DIAGNOSIS — E785 Hyperlipidemia, unspecified: Secondary | ICD-10-CM | POA: Diagnosis not present

## 2012-08-17 ENCOUNTER — Ambulatory Visit (INDEPENDENT_AMBULATORY_CARE_PROVIDER_SITE_OTHER): Payer: Medicare Other | Admitting: *Deleted

## 2012-08-17 ENCOUNTER — Ambulatory Visit: Payer: Medicare Other

## 2012-08-17 DIAGNOSIS — E538 Deficiency of other specified B group vitamins: Secondary | ICD-10-CM | POA: Diagnosis not present

## 2012-08-17 MED ORDER — CYANOCOBALAMIN 1000 MCG/ML IJ SOLN
1000.0000 ug | Freq: Once | INTRAMUSCULAR | Status: AC
  Administered 2012-08-17: 1000 ug via INTRAMUSCULAR

## 2012-08-28 DIAGNOSIS — R197 Diarrhea, unspecified: Secondary | ICD-10-CM | POA: Diagnosis not present

## 2012-08-28 DIAGNOSIS — E039 Hypothyroidism, unspecified: Secondary | ICD-10-CM | POA: Diagnosis not present

## 2012-08-28 DIAGNOSIS — E785 Hyperlipidemia, unspecified: Secondary | ICD-10-CM | POA: Diagnosis not present

## 2012-08-28 DIAGNOSIS — R29898 Other symptoms and signs involving the musculoskeletal system: Secondary | ICD-10-CM | POA: Diagnosis not present

## 2012-08-28 DIAGNOSIS — N390 Urinary tract infection, site not specified: Secondary | ICD-10-CM | POA: Diagnosis not present

## 2012-08-28 DIAGNOSIS — I129 Hypertensive chronic kidney disease with stage 1 through stage 4 chronic kidney disease, or unspecified chronic kidney disease: Secondary | ICD-10-CM | POA: Diagnosis not present

## 2012-08-28 DIAGNOSIS — N3080 Other cystitis without hematuria: Secondary | ICD-10-CM | POA: Diagnosis not present

## 2012-08-28 DIAGNOSIS — Z79899 Other long term (current) drug therapy: Secondary | ICD-10-CM | POA: Diagnosis not present

## 2012-08-28 DIAGNOSIS — M109 Gout, unspecified: Secondary | ICD-10-CM | POA: Diagnosis not present

## 2012-08-28 DIAGNOSIS — N189 Chronic kidney disease, unspecified: Secondary | ICD-10-CM | POA: Diagnosis not present

## 2012-08-28 DIAGNOSIS — N1330 Unspecified hydronephrosis: Secondary | ICD-10-CM | POA: Diagnosis not present

## 2012-08-28 DIAGNOSIS — J45909 Unspecified asthma, uncomplicated: Secondary | ICD-10-CM | POA: Diagnosis not present

## 2012-08-28 DIAGNOSIS — N3946 Mixed incontinence: Secondary | ICD-10-CM | POA: Diagnosis not present

## 2012-08-28 DIAGNOSIS — K219 Gastro-esophageal reflux disease without esophagitis: Secondary | ICD-10-CM | POA: Diagnosis not present

## 2012-08-28 DIAGNOSIS — Z885 Allergy status to narcotic agent status: Secondary | ICD-10-CM | POA: Diagnosis not present

## 2012-08-29 ENCOUNTER — Telehealth: Payer: Self-pay | Admitting: Internal Medicine

## 2012-08-29 MED ORDER — MONTELUKAST SODIUM 10 MG PO TABS: 10.0000 mg | ORAL_TABLET | Freq: Every day | ORAL | Status: DC

## 2012-08-29 NOTE — Telephone Encounter (Signed)
Refilled singulair #30 with 5 refills

## 2012-09-07 DIAGNOSIS — K219 Gastro-esophageal reflux disease without esophagitis: Secondary | ICD-10-CM | POA: Diagnosis not present

## 2012-09-07 DIAGNOSIS — R159 Full incontinence of feces: Secondary | ICD-10-CM | POA: Diagnosis not present

## 2012-09-07 DIAGNOSIS — R3915 Urgency of urination: Secondary | ICD-10-CM | POA: Diagnosis not present

## 2012-09-07 DIAGNOSIS — E785 Hyperlipidemia, unspecified: Secondary | ICD-10-CM | POA: Diagnosis not present

## 2012-09-07 DIAGNOSIS — M109 Gout, unspecified: Secondary | ICD-10-CM | POA: Diagnosis not present

## 2012-09-07 DIAGNOSIS — Z87440 Personal history of urinary (tract) infections: Secondary | ICD-10-CM | POA: Diagnosis not present

## 2012-09-07 DIAGNOSIS — I10 Essential (primary) hypertension: Secondary | ICD-10-CM | POA: Diagnosis not present

## 2012-09-07 DIAGNOSIS — N3946 Mixed incontinence: Secondary | ICD-10-CM | POA: Diagnosis not present

## 2012-09-07 DIAGNOSIS — J45909 Unspecified asthma, uncomplicated: Secondary | ICD-10-CM | POA: Diagnosis not present

## 2012-09-07 DIAGNOSIS — E039 Hypothyroidism, unspecified: Secondary | ICD-10-CM | POA: Diagnosis not present

## 2012-09-19 ENCOUNTER — Encounter: Payer: Self-pay | Admitting: Internal Medicine

## 2012-09-19 ENCOUNTER — Ambulatory Visit (INDEPENDENT_AMBULATORY_CARE_PROVIDER_SITE_OTHER): Payer: Medicare Other | Admitting: *Deleted

## 2012-09-19 DIAGNOSIS — E538 Deficiency of other specified B group vitamins: Secondary | ICD-10-CM

## 2012-09-19 MED ORDER — CYANOCOBALAMIN 1000 MCG/ML IJ SOLN
1000.0000 ug | Freq: Once | INTRAMUSCULAR | Status: AC
  Administered 2012-09-19: 1000 ug via INTRAMUSCULAR

## 2012-09-19 NOTE — Telephone Encounter (Signed)
Pt sent request to update meds: Mirabegron 25mg  TB24 (25mg  daily), Conjugated estrogen 0.625mg /gram vaginal cream (premarin)-apply with fingertip to Urethral meatus QHS

## 2012-10-01 ENCOUNTER — Other Ambulatory Visit: Payer: Self-pay | Admitting: Internal Medicine

## 2012-10-01 MED ORDER — ESOMEPRAZOLE MAGNESIUM 40 MG PO CPDR: 40.0000 mg | DELAYED_RELEASE_CAPSULE | Freq: Every day | ORAL | Status: DC

## 2012-10-01 NOTE — Progress Notes (Signed)
Refilled nexium 40mg  #90 with one refill.

## 2012-10-18 DIAGNOSIS — Z87440 Personal history of urinary (tract) infections: Secondary | ICD-10-CM | POA: Diagnosis not present

## 2012-10-18 DIAGNOSIS — R3915 Urgency of urination: Secondary | ICD-10-CM | POA: Diagnosis not present

## 2012-10-18 DIAGNOSIS — N3946 Mixed incontinence: Secondary | ICD-10-CM | POA: Diagnosis not present

## 2012-10-24 ENCOUNTER — Other Ambulatory Visit (INDEPENDENT_AMBULATORY_CARE_PROVIDER_SITE_OTHER): Payer: Medicare Other

## 2012-10-24 ENCOUNTER — Ambulatory Visit (INDEPENDENT_AMBULATORY_CARE_PROVIDER_SITE_OTHER): Payer: Medicare Other | Admitting: *Deleted

## 2012-10-24 DIAGNOSIS — E538 Deficiency of other specified B group vitamins: Secondary | ICD-10-CM

## 2012-10-24 DIAGNOSIS — E7800 Pure hypercholesterolemia, unspecified: Secondary | ICD-10-CM

## 2012-10-24 DIAGNOSIS — I10 Essential (primary) hypertension: Secondary | ICD-10-CM

## 2012-10-24 DIAGNOSIS — E119 Type 2 diabetes mellitus without complications: Secondary | ICD-10-CM | POA: Diagnosis not present

## 2012-10-24 DIAGNOSIS — R5383 Other fatigue: Secondary | ICD-10-CM

## 2012-10-24 DIAGNOSIS — R5381 Other malaise: Secondary | ICD-10-CM | POA: Diagnosis not present

## 2012-10-24 LAB — CBC WITH DIFFERENTIAL/PLATELET
Basophils Absolute: 0 10*3/uL (ref 0.0–0.1)
Eosinophils Absolute: 0.6 10*3/uL (ref 0.0–0.7)
Lymphocytes Relative: 34.5 % (ref 12.0–46.0)
MCHC: 32.7 g/dL (ref 30.0–36.0)
Neutro Abs: 4.8 10*3/uL (ref 1.4–7.7)
Neutrophils Relative %: 50.9 % (ref 43.0–77.0)
Platelets: 287 10*3/uL (ref 150.0–400.0)
RDW: 15 % — ABNORMAL HIGH (ref 11.5–14.6)

## 2012-10-24 LAB — HEMOGLOBIN A1C: Hgb A1c MFr Bld: 6.5 % (ref 4.6–6.5)

## 2012-10-24 LAB — HEPATIC FUNCTION PANEL
AST: 35 U/L (ref 0–37)
Total Bilirubin: 0.6 mg/dL (ref 0.3–1.2)

## 2012-10-24 LAB — BASIC METABOLIC PANEL
Chloride: 104 mEq/L (ref 96–112)
Creatinine, Ser: 1.4 mg/dL — ABNORMAL HIGH (ref 0.4–1.2)
Sodium: 141 mEq/L (ref 135–145)

## 2012-10-24 LAB — LIPID PANEL
Cholesterol: 147 mg/dL (ref 0–200)
LDL Cholesterol: 86 mg/dL (ref 0–99)
Triglycerides: 104 mg/dL (ref 0.0–149.0)
VLDL: 20.8 mg/dL (ref 0.0–40.0)

## 2012-10-24 MED ORDER — CYANOCOBALAMIN 1000 MCG/ML IJ SOLN
1000.0000 ug | Freq: Once | INTRAMUSCULAR | Status: AC
  Administered 2012-10-24: 1000 ug via INTRAMUSCULAR

## 2012-10-25 ENCOUNTER — Encounter: Payer: Self-pay | Admitting: Internal Medicine

## 2012-10-30 ENCOUNTER — Ambulatory Visit (INDEPENDENT_AMBULATORY_CARE_PROVIDER_SITE_OTHER): Payer: Medicare Other | Admitting: Internal Medicine

## 2012-10-30 ENCOUNTER — Encounter: Payer: Self-pay | Admitting: Internal Medicine

## 2012-10-30 VITALS — BP 110/70 | HR 100 | Temp 98.6°F | Ht 66.5 in | Wt 184.8 lb

## 2012-10-30 DIAGNOSIS — I739 Peripheral vascular disease, unspecified: Secondary | ICD-10-CM | POA: Diagnosis not present

## 2012-10-30 DIAGNOSIS — N289 Disorder of kidney and ureter, unspecified: Secondary | ICD-10-CM

## 2012-10-30 DIAGNOSIS — Z87440 Personal history of urinary (tract) infections: Secondary | ICD-10-CM

## 2012-10-30 DIAGNOSIS — I10 Essential (primary) hypertension: Secondary | ICD-10-CM

## 2012-10-30 DIAGNOSIS — K219 Gastro-esophageal reflux disease without esophagitis: Secondary | ICD-10-CM

## 2012-10-30 DIAGNOSIS — E538 Deficiency of other specified B group vitamins: Secondary | ICD-10-CM

## 2012-10-30 DIAGNOSIS — E559 Vitamin D deficiency, unspecified: Secondary | ICD-10-CM

## 2012-10-30 DIAGNOSIS — M109 Gout, unspecified: Secondary | ICD-10-CM

## 2012-10-30 DIAGNOSIS — E119 Type 2 diabetes mellitus without complications: Secondary | ICD-10-CM

## 2012-10-30 DIAGNOSIS — M899 Disorder of bone, unspecified: Secondary | ICD-10-CM

## 2012-10-30 DIAGNOSIS — E7800 Pure hypercholesterolemia, unspecified: Secondary | ICD-10-CM

## 2012-10-30 DIAGNOSIS — M8580 Other specified disorders of bone density and structure, unspecified site: Secondary | ICD-10-CM

## 2012-10-30 NOTE — Assessment & Plan Note (Signed)
Recently on multiple rounds of abx.  On macrobid.  Continues to follow up with urology.

## 2012-10-30 NOTE — Assessment & Plan Note (Signed)
On replacement.  Follow.  

## 2012-10-30 NOTE — Assessment & Plan Note (Signed)
Blood pressure under good control.  Same medication regimen.  Follow metabolic panel.  Last Cr 10/24/12 - 1.4.  Stay hydrated.  Recheck within the next few weeks.

## 2012-10-30 NOTE — Assessment & Plan Note (Signed)
Saw Dr Cline.  On Allopurinol.    

## 2012-10-30 NOTE — Assessment & Plan Note (Signed)
Asymptomatic.  Varying pressures in each arm.  Stable.   

## 2012-10-30 NOTE — Assessment & Plan Note (Signed)
Continue calcium and vitamin D 

## 2012-10-30 NOTE — Assessment & Plan Note (Signed)
Low carb diet and exercise.  Follow met b and a1c.  A1c just checked 10/24/12 - 6.5.

## 2012-10-30 NOTE — Assessment & Plan Note (Signed)
Symptoms controlled.  Follow.   

## 2012-10-30 NOTE — Assessment & Plan Note (Signed)
B12 injection given today. 

## 2012-10-30 NOTE — Assessment & Plan Note (Signed)
Continue simvastatin.  Low cholesterol diet.  Follow lipid panel and liver function.  Lipid panel 10/24/12 revealed total cholesterol 147, triglycerides 104, HDL 40 and LDL 86.

## 2012-10-30 NOTE — Progress Notes (Signed)
Subjective:    [name], female    DOB: 12-07-1926, 79 y.o.   MRN: [id number]  HPI 79 year old female with past history of colonic polyps, vascular disease, hypertension, reoccurring urinary tract infections and hypercholesterolemia who comes in today for a scheduled follow up.  She is accompanied by her daughter.  History obtained from both of them.  She had knee surgery 8/13 (by Dr Erin Sons).  Had therapy.  Feeling stronger now.  She has been on several rounds of abx recently for urinary tract infections.  Just started macrobid.  Has been seeing Dr Raynald Kemp recently.  Now returning back to Dr Lavella Hammock.  Just started on Myrbetriq.  Also took hipprex.  Symptoms have improved some.   No cardiac symptoms with increased activity or exertion.  Breathing stable.  Bowels stable.  No abdominal pain or cramping.  Energy improved.  Overall feels better.       Past Medical History  Diagnosis Date  . Hypertension   . Hypercholesterolemia   . Peripheral vascular disease   . GERD (gastroesophageal reflux disease)     hiatal hernia  . History of frequent urinary tract infections   . Diabetes mellitus   . History of colon polyps   . Hematuria     followed by urology  . Gout   . Humeral fracture 9/09    comminuted impacted proximal    Current Outpatient Prescriptions on File Prior to Visit  Medication Sig Dispense Refill  . allopurinol (ZYLOPRIM) 100 MG tablet Take 100 mg by mouth daily.      . Calcium Carbonate-Vitamin D (CALCIUM 600+D) 600-400 MG-UNIT per tablet Take 1 tablet by mouth daily.       . cetirizine (ZYRTEC) 10 MG tablet Take 10 mg by mouth daily.      . Cholecalciferol (VITAMIN D-3) 1000 UNITS CAPS Take 1 capsule by mouth daily.      . Cranberry-Cholecalciferol 4200-500 MG-UNIT CAPS Take by mouth.      . Cyanocobalamin 1000 MCG/ML KIT Inject as directed every 30 (thirty) days.      Marland Kitchen doxycycline (ORACEA) 40 MG capsule Take 40 mg by mouth daily.      Marland Kitchen  esomeprazole (NEXIUM) 40 MG capsule Take 1 capsule (40 mg total) by mouth daily before breakfast.  90 capsule  1  . FLUoxetine (PROZAC) 20 MG capsule Take 20 mg by mouth daily.      . Fluticasone-Salmeterol (ADVAIR) 100-50 MCG/DOSE AEPB Inhale 2 puffs into the lungs 2 (two) times daily.       Marland Kitchen levothyroxine (SYNTHROID) 50 MCG tablet Take 50 mcg by mouth daily.      Marland Kitchen losartan (COZAAR) 100 MG tablet Take 100 mg by mouth daily.      . montelukast (SINGULAIR) 10 MG tablet Take 1 tablet (10 mg total) by mouth at bedtime.  30 tablet  5  . simvastatin (ZOCOR) 20 MG tablet Take 1 tablet (20 mg total) by mouth daily.  30 tablet  1  . solifenacin (VESICARE) 5 MG tablet 1 tablet qod       No current facility-administered medications on file prior to visit.    Review of Systems Patient denies any headache, lightheadedness or dizziness.  No significant sinus or allergy symptoms.  No chest pain, tightness or palpitations.  No increased shortness of breath, cough or congestion.  Breathing stable.  No nausea or vomiting.  No abdominal pain or cramping.  No bowel change, such as diarrhea,  constipation, BRBPR or melana.  Urinary symptoms have improved some. Was seeing Dr Casilda Carls for her bladder issues.  Returning to Dr Lavella Hammock.          Objective:   Physical Exam  Filed Vitals:   10/30/12 1436  BP: 110/70  Pulse: 100  Temp: 98.6 F (37 C)   Blood pressure recheck:  118/68 (right), pulse 52  79 year old female in no acute distress.   HEENT:  Nares- clear.  Oropharynx - without lesions. NECK:  Supple.  Nontender.  No audible bruit.  HEART:  Appears to be regular. LUNGS:  No crackles or wheezing audible.  Respirations even and unlabored.  RADIAL PULSE:  Equal bilaterally. .  ABDOMEN:  Soft, nontender.  Bowel sounds present and normal.  No audible abdominal bruit  EXTREMITIES:  Swelling improved.   DP pulses palpable and equal bilaterally.  Some stasis changes.  No infection.         Assessment & Plan:  MSK.  S/p knee surgery.  Has been released.   LEG WEAKNESS.  Has improved.       PULMONARY.  Breathing stable.    CARDIOVASCULAR.  Stable.    LOWER EXTREMITY SWELLING.  Felt to be related to venostasis.  Compression stockings.  Improved.   HEALTH MAINTENANCE.  Physical 08/14/12.  Colonoscopy 9/02 revealed diverticulosis.  Saw GI and they felt no further w/up warranted.  Previous bone density revealed osteopenia.  Scheduled for a  mammogram.  Need results if done.

## 2012-11-03 ENCOUNTER — Encounter: Payer: Self-pay | Admitting: Emergency Medicine

## 2012-11-10 ENCOUNTER — Other Ambulatory Visit: Payer: Medicare Other

## 2012-11-14 ENCOUNTER — Ambulatory Visit: Payer: Medicare Other | Admitting: Internal Medicine

## 2012-11-22 ENCOUNTER — Other Ambulatory Visit: Payer: Self-pay | Admitting: *Deleted

## 2012-11-22 MED ORDER — LEVOTHYROXINE SODIUM 50 MCG PO TABS: 50.0000 ug | ORAL_TABLET | Freq: Every day | ORAL | Status: DC

## 2012-11-29 ENCOUNTER — Other Ambulatory Visit (INDEPENDENT_AMBULATORY_CARE_PROVIDER_SITE_OTHER): Payer: Medicare Other

## 2012-11-29 ENCOUNTER — Ambulatory Visit (INDEPENDENT_AMBULATORY_CARE_PROVIDER_SITE_OTHER): Payer: Medicare Other | Admitting: *Deleted

## 2012-11-29 DIAGNOSIS — E538 Deficiency of other specified B group vitamins: Secondary | ICD-10-CM | POA: Diagnosis not present

## 2012-11-29 DIAGNOSIS — I10 Essential (primary) hypertension: Secondary | ICD-10-CM | POA: Diagnosis not present

## 2012-11-29 DIAGNOSIS — N289 Disorder of kidney and ureter, unspecified: Secondary | ICD-10-CM

## 2012-11-29 LAB — BASIC METABOLIC PANEL
Calcium: 9.8 mg/dL (ref 8.4–10.5)
Chloride: 106 mEq/L (ref 96–112)
Creatinine, Ser: 1.7 mg/dL — ABNORMAL HIGH (ref 0.4–1.2)
Sodium: 141 mEq/L (ref 135–145)

## 2012-11-29 MED ORDER — CYANOCOBALAMIN 1000 MCG/ML IJ SOLN
1000.0000 ug | Freq: Once | INTRAMUSCULAR | Status: AC
  Administered 2012-11-29: 1000 ug via INTRAMUSCULAR

## 2012-11-30 ENCOUNTER — Other Ambulatory Visit: Payer: Self-pay | Admitting: Internal Medicine

## 2012-11-30 DIAGNOSIS — N289 Disorder of kidney and ureter, unspecified: Secondary | ICD-10-CM

## 2012-11-30 NOTE — Progress Notes (Signed)
Order placed for f/u met b.  

## 2012-12-01 ENCOUNTER — Other Ambulatory Visit (INDEPENDENT_AMBULATORY_CARE_PROVIDER_SITE_OTHER): Payer: Medicare Other

## 2012-12-01 DIAGNOSIS — N289 Disorder of kidney and ureter, unspecified: Secondary | ICD-10-CM

## 2012-12-01 LAB — BASIC METABOLIC PANEL
Chloride: 111 mEq/L (ref 96–112)
GFR: 33.11 mL/min — ABNORMAL LOW (ref >60.00)
Potassium: 4.9 mEq/L (ref 3.5–5.1)
Sodium: 141 mEq/L (ref 135–145)

## 2012-12-05 ENCOUNTER — Telehealth: Payer: Self-pay | Admitting: Internal Medicine

## 2012-12-05 ENCOUNTER — Encounter: Payer: Self-pay | Admitting: Internal Medicine

## 2012-12-05 DIAGNOSIS — N289 Disorder of kidney and ureter, unspecified: Secondary | ICD-10-CM

## 2012-12-05 NOTE — Telephone Encounter (Signed)
Pt notified of lab results via my chart.  She was also notified that she needed a f/u lab within the next 2 weeks.  Please schedule her for a non fasting lab appt in 2 weeks and call her with an appt date and time.  Thanks.

## 2012-12-06 NOTE — Telephone Encounter (Signed)
Sent my chart message letting pt know of appointment date and time

## 2012-12-15 ENCOUNTER — Other Ambulatory Visit: Payer: Self-pay | Admitting: *Deleted

## 2012-12-15 MED ORDER — ALLOPURINOL 100 MG PO TABS: 100.0000 mg | ORAL_TABLET | Freq: Every day | ORAL | Status: DC

## 2012-12-20 ENCOUNTER — Other Ambulatory Visit (INDEPENDENT_AMBULATORY_CARE_PROVIDER_SITE_OTHER): Payer: Medicare Other

## 2012-12-20 DIAGNOSIS — N289 Disorder of kidney and ureter, unspecified: Secondary | ICD-10-CM

## 2012-12-20 LAB — BASIC METABOLIC PANEL
CO2: 24 mEq/L (ref 19–32)
Chloride: 106 mEq/L (ref 96–112)
Sodium: 139 mEq/L (ref 135–145)

## 2012-12-21 ENCOUNTER — Ambulatory Visit: Payer: Self-pay | Admitting: Internal Medicine

## 2012-12-21 DIAGNOSIS — Z1231 Encounter for screening mammogram for malignant neoplasm of breast: Secondary | ICD-10-CM | POA: Diagnosis not present

## 2012-12-26 ENCOUNTER — Other Ambulatory Visit: Payer: Self-pay | Admitting: Internal Medicine

## 2012-12-26 ENCOUNTER — Telehealth: Payer: Self-pay | Admitting: Internal Medicine

## 2012-12-26 ENCOUNTER — Ambulatory Visit: Payer: Self-pay | Admitting: Internal Medicine

## 2012-12-26 ENCOUNTER — Encounter: Payer: Self-pay | Admitting: Internal Medicine

## 2012-12-26 DIAGNOSIS — N6459 Other signs and symptoms in breast: Secondary | ICD-10-CM | POA: Diagnosis not present

## 2012-12-26 DIAGNOSIS — R928 Other abnormal and inconclusive findings on diagnostic imaging of breast: Secondary | ICD-10-CM | POA: Diagnosis not present

## 2012-12-26 DIAGNOSIS — I10 Essential (primary) hypertension: Secondary | ICD-10-CM

## 2012-12-26 DIAGNOSIS — E7800 Pure hypercholesterolemia, unspecified: Secondary | ICD-10-CM

## 2012-12-26 DIAGNOSIS — E119 Type 2 diabetes mellitus without complications: Secondary | ICD-10-CM

## 2012-12-26 NOTE — Telephone Encounter (Signed)
Notified of lab results via my chart.  Needs a f/u lab appt 1-2 days before her 02/1313 appt.  Please schedule a fasting lab appt and call pt with appt date and time.  Thanks.

## 2012-12-26 NOTE — Telephone Encounter (Signed)
Sent pt my chart message about lab appointment date and time

## 2012-12-26 NOTE — Progress Notes (Signed)
Order placed for f/u labs.  

## 2012-12-27 ENCOUNTER — Other Ambulatory Visit: Payer: Self-pay

## 2013-01-01 ENCOUNTER — Telehealth: Payer: Self-pay | Admitting: Internal Medicine

## 2013-01-01 NOTE — Telephone Encounter (Signed)
Opened in error

## 2013-01-02 ENCOUNTER — Other Ambulatory Visit: Payer: Self-pay | Admitting: *Deleted

## 2013-01-02 ENCOUNTER — Ambulatory Visit (INDEPENDENT_AMBULATORY_CARE_PROVIDER_SITE_OTHER): Payer: Medicare Other | Admitting: *Deleted

## 2013-01-02 DIAGNOSIS — E538 Deficiency of other specified B group vitamins: Secondary | ICD-10-CM

## 2013-01-02 MED ORDER — CYANOCOBALAMIN 1000 MCG/ML IJ SOLN
1000.0000 ug | Freq: Once | INTRAMUSCULAR | Status: AC
  Administered 2013-01-02: 1000 ug via INTRAMUSCULAR

## 2013-01-03 MED ORDER — FLUOXETINE HCL 20 MG PO CAPS: 20.0000 mg | ORAL_CAPSULE | Freq: Every day | ORAL | Status: DC

## 2013-01-03 NOTE — Telephone Encounter (Signed)
Refilled #30 with 3 refills. 

## 2013-01-03 NOTE — Telephone Encounter (Signed)
Okay to refill? Last seen in June 

## 2013-01-15 ENCOUNTER — Telehealth: Payer: Self-pay | Admitting: Internal Medicine

## 2013-01-15 DIAGNOSIS — R928 Other abnormal and inconclusive findings on diagnostic imaging of breast: Secondary | ICD-10-CM

## 2013-01-15 NOTE — Telephone Encounter (Signed)
Pt.notified

## 2013-01-15 NOTE — Telephone Encounter (Signed)
Notify pt that her f/u mammogram - radiology read as probably benign.  They did recommend a f/u left breast mammo in 6 months.  Notify her that amber should be calling her with a mammogram appt.

## 2013-01-16 ENCOUNTER — Encounter: Payer: Self-pay | Admitting: Emergency Medicine

## 2013-02-07 ENCOUNTER — Encounter: Payer: Self-pay | Admitting: Internal Medicine

## 2013-02-07 ENCOUNTER — Telehealth: Payer: Self-pay | Admitting: *Deleted

## 2013-02-07 ENCOUNTER — Ambulatory Visit (INDEPENDENT_AMBULATORY_CARE_PROVIDER_SITE_OTHER): Payer: Medicare Other | Admitting: *Deleted

## 2013-02-07 DIAGNOSIS — E538 Deficiency of other specified B group vitamins: Secondary | ICD-10-CM

## 2013-02-07 MED ORDER — CYANOCOBALAMIN 1000 MCG/ML IJ SOLN
1000.0000 ug | Freq: Once | INTRAMUSCULAR | Status: AC
  Administered 2013-02-07: 1000 ug via INTRAMUSCULAR

## 2013-02-07 NOTE — Telephone Encounter (Signed)
Notify pt that she can try protonix (if has not had problems with this previously).  Ok to call in rx for protonix 40mg  q day #30 with 3 refills.

## 2013-02-07 NOTE — Telephone Encounter (Signed)
Pt wants to get a generic substitute for the Nexium 40mg . Wants a 30 days sent to [facility]. Has an appt with you on 10/13. Will run out of Nexium in a few days, but wants to try something cheaper.

## 2013-02-08 ENCOUNTER — Other Ambulatory Visit: Payer: Self-pay | Admitting: *Deleted

## 2013-02-08 MED ORDER — ESOMEPRAZOLE MAGNESIUM 40 MG PO PACK: 40.0000 mg | PACK | Freq: Every day | ORAL | Status: DC

## 2013-02-08 MED ORDER — PANTOPRAZOLE SODIUM 40 MG PO TBEC: 40.0000 mg | DELAYED_RELEASE_TABLET | Freq: Every day | ORAL | Status: DC

## 2013-02-08 NOTE — Telephone Encounter (Signed)
Pt notified & Rx eRX'd to pharmacy

## 2013-02-09 ENCOUNTER — Other Ambulatory Visit: Payer: Self-pay | Admitting: *Deleted

## 2013-02-09 MED ORDER — SIMVASTATIN 20 MG PO TABS: 20.0000 mg | ORAL_TABLET | Freq: Every day | ORAL | Status: DC

## 2013-02-28 ENCOUNTER — Other Ambulatory Visit: Payer: Medicare Other

## 2013-02-28 ENCOUNTER — Other Ambulatory Visit (INDEPENDENT_AMBULATORY_CARE_PROVIDER_SITE_OTHER): Payer: Medicare Other

## 2013-02-28 DIAGNOSIS — I10 Essential (primary) hypertension: Secondary | ICD-10-CM | POA: Diagnosis not present

## 2013-02-28 DIAGNOSIS — E119 Type 2 diabetes mellitus without complications: Secondary | ICD-10-CM | POA: Diagnosis not present

## 2013-02-28 DIAGNOSIS — E7800 Pure hypercholesterolemia, unspecified: Secondary | ICD-10-CM | POA: Diagnosis not present

## 2013-02-28 LAB — HEMOGLOBIN A1C: Hgb A1c MFr Bld: 6.5 % (ref 4.6–6.5)

## 2013-02-28 LAB — BASIC METABOLIC PANEL
CO2: 25 mEq/L (ref 19–32)
Calcium: 9.3 mg/dL (ref 8.4–10.5)
Creatinine, Ser: 1.5 mg/dL — ABNORMAL HIGH (ref 0.4–1.2)
GFR: 36.54 mL/min — ABNORMAL LOW (ref >60.00)
Sodium: 144 mEq/L (ref 135–145)

## 2013-02-28 LAB — LIPID PANEL
Cholesterol: 147 mg/dL (ref 0–200)
HDL: 51.4 mg/dL (ref >39.00)
Total CHOL/HDL Ratio: 3
Triglycerides: 86 mg/dL (ref 0.0–149.0)

## 2013-02-28 LAB — HEPATIC FUNCTION PANEL
ALT: 13 U/L (ref 0–35)
AST: 26 U/L (ref 0–37)
Albumin: 3.6 g/dL (ref 3.5–5.2)
Alkaline Phosphatase: 55 U/L (ref 39–117)
Total Protein: 6.7 g/dL (ref 6.0–8.3)

## 2013-02-28 LAB — MICROALBUMIN / CREATININE URINE RATIO
Creatinine,U: 170.2 mg/dL
Microalb Creat Ratio: 3.1 mg/g (ref 0.0–30.0)

## 2013-03-01 ENCOUNTER — Encounter: Payer: Self-pay | Admitting: Internal Medicine

## 2013-03-05 ENCOUNTER — Ambulatory Visit (INDEPENDENT_AMBULATORY_CARE_PROVIDER_SITE_OTHER): Payer: Medicare Other | Admitting: Internal Medicine

## 2013-03-05 ENCOUNTER — Encounter: Payer: Self-pay | Admitting: Internal Medicine

## 2013-03-05 VITALS — BP 110/70 | HR 90 | Temp 98.3°F | Ht 66.5 in | Wt 193.2 lb

## 2013-03-05 DIAGNOSIS — E119 Type 2 diabetes mellitus without complications: Secondary | ICD-10-CM

## 2013-03-05 DIAGNOSIS — M8580 Other specified disorders of bone density and structure, unspecified site: Secondary | ICD-10-CM

## 2013-03-05 DIAGNOSIS — I10 Essential (primary) hypertension: Secondary | ICD-10-CM

## 2013-03-05 DIAGNOSIS — K219 Gastro-esophageal reflux disease without esophagitis: Secondary | ICD-10-CM

## 2013-03-05 DIAGNOSIS — I739 Peripheral vascular disease, unspecified: Secondary | ICD-10-CM

## 2013-03-05 DIAGNOSIS — M109 Gout, unspecified: Secondary | ICD-10-CM

## 2013-03-05 DIAGNOSIS — Z87440 Personal history of urinary (tract) infections: Secondary | ICD-10-CM

## 2013-03-05 DIAGNOSIS — E559 Vitamin D deficiency, unspecified: Secondary | ICD-10-CM | POA: Diagnosis not present

## 2013-03-05 DIAGNOSIS — M899 Disorder of bone, unspecified: Secondary | ICD-10-CM | POA: Diagnosis not present

## 2013-03-05 DIAGNOSIS — E7800 Pure hypercholesterolemia, unspecified: Secondary | ICD-10-CM

## 2013-03-05 DIAGNOSIS — E538 Deficiency of other specified B group vitamins: Secondary | ICD-10-CM

## 2013-03-11 ENCOUNTER — Encounter: Payer: Self-pay | Admitting: Internal Medicine

## 2013-03-11 NOTE — Assessment & Plan Note (Signed)
Saw Dr Cline.  On Allopurinol.    

## 2013-03-11 NOTE — Assessment & Plan Note (Signed)
Continue simvastatin.  Low cholesterol diet.  Follow lipid panel and liver function.  Lipid panel 02/28/13 revealed total cholesterol 147, triglycerides 86, HDL 51 and LDL 78.   

## 2013-03-11 NOTE — Assessment & Plan Note (Signed)
Blood pressure under good control.  Same medication regimen.  Follow metabolic panel.  Last Cr 02/28/13 - 1.5.  Stay hydrated.

## 2013-03-11 NOTE — Assessment & Plan Note (Signed)
Low carb diet and exercise.  Follow met b and a1c.  A1c just checked 02/28/13 - 6.5.  Keep up to date with eye checks.   

## 2013-03-11 NOTE — Assessment & Plan Note (Signed)
Has not required abx recently.  Continue to follow up with urology.   

## 2013-03-11 NOTE — Assessment & Plan Note (Signed)
Asymptomatic.  Varying pressures in each arm.  Stable.   

## 2013-03-11 NOTE — Progress Notes (Signed)
Subjective:    [name], female    DOB: 05/24/1927, 79 y.o.   MRN: [id number]  HPI 79 year old female with past history of colonic polyps, vascular disease, hypertension, reoccurring urinary tract infections and hypercholesterolemia who comes in today for a scheduled follow up.  She had knee surgery 8/13 (by Dr Erin Sons).  Had therapy.  Feeling stronger now.   Has been seeing Dr Raynald Kemp recently.  Now returning back to Dr Lavella Hammock.  Just started on Myrbetriq.  Also took hipprex.  Symptoms have improved some.  No recent infections.   No cardiac symptoms with increased activity or exertion.  Breathing stable.  Bowels stable.  No abdominal pain or cramping.  Energy improved.  Overall feels better.       Past Medical History  Diagnosis Date  . Hypertension   . Hypercholesterolemia   . Peripheral vascular disease   . GERD (gastroesophageal reflux disease)     hiatal hernia  . History of frequent urinary tract infections   . Diabetes mellitus   . History of colon polyps   . Hematuria     followed by urology  . Gout   . Humeral fracture 9/09    comminuted impacted proximal    Current Outpatient Prescriptions on File Prior to Visit  Medication Sig Dispense Refill  . allopurinol (ZYLOPRIM) 100 MG tablet Take 1 tablet (100 mg total) by mouth daily.  30 tablet  5  . Calcium Carbonate-Vitamin D (CALCIUM 600+D) 600-400 MG-UNIT per tablet Take 1 tablet by mouth daily.       . cetirizine (ZYRTEC) 10 MG tablet Take 10 mg by mouth daily.      . Cholecalciferol (VITAMIN D-3) 1000 UNITS CAPS Take 1 capsule by mouth daily.      . Cranberry-Cholecalciferol 4200-500 MG-UNIT CAPS Take by mouth.      . Cyanocobalamin 1000 MCG/ML KIT Inject as directed every 30 (thirty) days.      Marland Kitchen doxycycline (ORACEA) 40 MG capsule Take 40 mg by mouth daily.      Marland Kitchen FLUoxetine (PROZAC) 20 MG capsule Take 1 capsule (20 mg total) by mouth daily.  30 capsule  3  . Fluticasone-Salmeterol  (ADVAIR) 100-50 MCG/DOSE AEPB Inhale 2 puffs into the lungs 2 (two) times daily.       Marland Kitchen levothyroxine (SYNTHROID) 50 MCG tablet Take 1 tablet (50 mcg total) by mouth daily.  30 tablet  10  . losartan (COZAAR) 100 MG tablet Take 100 mg by mouth daily.      . methenamine (HIPREX) 1 G tablet Take 1 g by mouth 2 (two) times daily with a meal.      . mirabegron ER (MYRBETRIQ) 25 MG TB24 Take 25 mg by mouth daily.      . montelukast (SINGULAIR) 10 MG tablet Take 1 tablet (10 mg total) by mouth at bedtime.  30 tablet  5  . pantoprazole (PROTONIX) 40 MG tablet Take 1 tablet (40 mg total) by mouth daily.  30 tablet  3  . simvastatin (ZOCOR) 20 MG tablet Take 1 tablet (20 mg total) by mouth daily.  30 tablet  5  . solifenacin (VESICARE) 5 MG tablet 1 tablet qod       No current facility-administered medications on file prior to visit.    Review of Systems Patient denies any headache, lightheadedness or dizziness.  No significant sinus or allergy symptoms.  No chest pain, tightness or palpitations.  No increased shortness of breath.  Breathing stable. Some congestion, but she reports this is stable.  No increased cough or congestion.   No nausea or vomiting.  No abdominal pain or cramping.  No bowel change, such as diarrhea, constipation, BRBPR or melana.  Urinary symptoms have improved some. Was seeing Dr Casilda Carls for her bladder issues.  Returning to Dr Lavella Hammock.   No abx recently.         Objective:   Physical Exam  Filed Vitals:   03/05/13 1413  BP: 110/70  Pulse: 90  Temp: 98.3 F (36.8 C)   Blood pressure recheck:  128/78 (right)  79 year old female in no acute distress.   HEENT:  Nares- clear.  Oropharynx - without lesions. NECK:  Supple.  Nontender.  No audible bruit.  HEART:  Appears to be regular. LUNGS:  No crackles or wheezing audible.  Respirations even and unlabored.   ABDOMEN:  Soft, nontender.  Bowel sounds present and normal.  No audible abdominal bruit   EXTREMITIES:  Swelling improved.   DP pulses palpable and equal bilaterally.  Some stasis changes.  No infection.   FEET:  No lesions.        Assessment & Plan:  MSK.  S/p knee surgery.  Has been released.   LEG WEAKNESS.  Has improved.       PULMONARY.  Breathing stable.  Some congestion.  She feels is stable.  Robitussin/mucinex as outlined.  Use advair as directed.  Follow.  Reassess at follow up appt.  Notify me if symptoms worsen.   CARDIOVASCULAR.  Stable.    LOWER EXTREMITY SWELLING.  Felt to be related to venostasis.  Compression stockings.  Improved.   HEALTH MAINTENANCE.  Physical 08/14/12.  Colonoscopy 9/02 revealed diverticulosis.  Saw GI and they felt no further w/up warranted.  Previous bone density revealed osteopenia.  Mammogram 12/20/12 - recommended f/u views.  Follow up views 12/26/12 - Birads III.  Recommended f/u in 6 months.

## 2013-03-11 NOTE — Assessment & Plan Note (Signed)
Symptoms controlled.  Follow.   

## 2013-03-11 NOTE — Assessment & Plan Note (Signed)
Continue vitamin D supplementation 

## 2013-03-11 NOTE — Assessment & Plan Note (Signed)
Continue B12 injections.   

## 2013-03-11 NOTE — Assessment & Plan Note (Signed)
On replacement.  Follow.  

## 2013-03-13 ENCOUNTER — Ambulatory Visit (INDEPENDENT_AMBULATORY_CARE_PROVIDER_SITE_OTHER): Payer: Medicare Other | Admitting: *Deleted

## 2013-03-13 DIAGNOSIS — E538 Deficiency of other specified B group vitamins: Secondary | ICD-10-CM | POA: Diagnosis not present

## 2013-03-13 MED ORDER — CYANOCOBALAMIN 1000 MCG/ML IJ SOLN
1000.0000 ug | Freq: Once | INTRAMUSCULAR | Status: AC
  Administered 2013-03-13: 1000 ug via INTRAMUSCULAR

## 2013-03-29 ENCOUNTER — Other Ambulatory Visit: Payer: Self-pay

## 2013-04-13 ENCOUNTER — Ambulatory Visit: Payer: Medicare Other

## 2013-04-16 DIAGNOSIS — Z23 Encounter for immunization: Secondary | ICD-10-CM | POA: Diagnosis not present

## 2013-04-17 ENCOUNTER — Encounter: Payer: Self-pay | Admitting: Internal Medicine

## 2013-04-17 ENCOUNTER — Ambulatory Visit (INDEPENDENT_AMBULATORY_CARE_PROVIDER_SITE_OTHER): Payer: Medicare Other | Admitting: Internal Medicine

## 2013-04-17 VITALS — BP 150/98 | HR 99 | Temp 99.0°F | Ht 66.5 in | Wt 194.5 lb

## 2013-04-17 DIAGNOSIS — M8580 Other specified disorders of bone density and structure, unspecified site: Secondary | ICD-10-CM

## 2013-04-17 DIAGNOSIS — K219 Gastro-esophageal reflux disease without esophagitis: Secondary | ICD-10-CM

## 2013-04-17 DIAGNOSIS — E538 Deficiency of other specified B group vitamins: Secondary | ICD-10-CM | POA: Diagnosis not present

## 2013-04-17 DIAGNOSIS — Z87440 Personal history of urinary (tract) infections: Secondary | ICD-10-CM

## 2013-04-17 DIAGNOSIS — E559 Vitamin D deficiency, unspecified: Secondary | ICD-10-CM | POA: Diagnosis not present

## 2013-04-17 DIAGNOSIS — I10 Essential (primary) hypertension: Secondary | ICD-10-CM | POA: Diagnosis not present

## 2013-04-17 DIAGNOSIS — J069 Acute upper respiratory infection, unspecified: Secondary | ICD-10-CM

## 2013-04-17 DIAGNOSIS — I739 Peripheral vascular disease, unspecified: Secondary | ICD-10-CM

## 2013-04-17 DIAGNOSIS — M109 Gout, unspecified: Secondary | ICD-10-CM

## 2013-04-17 DIAGNOSIS — E7800 Pure hypercholesterolemia, unspecified: Secondary | ICD-10-CM

## 2013-04-17 DIAGNOSIS — E119 Type 2 diabetes mellitus without complications: Secondary | ICD-10-CM

## 2013-04-17 DIAGNOSIS — M899 Disorder of bone, unspecified: Secondary | ICD-10-CM

## 2013-04-17 MED ORDER — CYANOCOBALAMIN 1000 MCG/ML IJ SOLN
1000.0000 ug | Freq: Once | INTRAMUSCULAR | Status: AC
  Administered 2013-04-17: 1000 ug via INTRAMUSCULAR

## 2013-04-17 MED ORDER — AZITHROMYCIN 250 MG PO TABS: ORAL_TABLET | ORAL | Status: DC

## 2013-04-17 MED ORDER — PREDNISONE 10 MG PO TABS: ORAL_TABLET | ORAL | Status: DC

## 2013-04-17 NOTE — Assessment & Plan Note (Signed)
Blood pressure under good control.  Same medication regimen.  Follow metabolic panel.  Last Cr 02/28/13 - 1.5.  Stay hydrated.    

## 2013-04-17 NOTE — Progress Notes (Signed)
Pre-visit discussion using our clinic review tool. No additional management support is needed unless otherwise documented below in the visit note.  

## 2013-04-17 NOTE — Progress Notes (Signed)
Subjective:    [name], female    DOB: 03-17-1927, 79 y.o.   MRN: [id number]  HPI 79 year old female with past history of colonic polyps, vascular disease, hypertension, reoccurring urinary tract infections and hypercholesterolemia who comes in today for a scheduled follow up.  She had knee surgery 8/13 (by Dr Erin Sons).  Had therapy.  Feeling stronger now.   Has been seeing Dr Raynald Kemp recently.  Now returning back to Dr Lavella Hammock.  Was started on Myrbetriq.  Also took hipprex.  Bladder symptoms have improved.  No recent infections.   No cardiac symptoms with increased activity or exertion.  Breathing relatively stable.  She does report some increased drainage.  Some sinus pressure.  Some cough productive.  Occasional wheezing.  Increased symptoms over the last week.  Bowels stable.  No abdominal pain or cramping.  Energy improved.  Still having some increased acid reflux.  On protonix.        Past Medical History  Diagnosis Date  . Hypertension   . Hypercholesterolemia   . Peripheral vascular disease   . GERD (gastroesophageal reflux disease)     hiatal hernia  . History of frequent urinary tract infections   . Diabetes mellitus   . History of colon polyps   . Hematuria     followed by urology  . Gout   . Humeral fracture 9/09    comminuted impacted proximal    Current Outpatient Prescriptions on File Prior to Visit  Medication Sig Dispense Refill  . allopurinol (ZYLOPRIM) 100 MG tablet Take 1 tablet (100 mg total) by mouth daily.  30 tablet  5  . Calcium Carbonate-Vitamin D (CALCIUM 600+D) 600-400 MG-UNIT per tablet Take 1 tablet by mouth daily.       . cetirizine (ZYRTEC) 10 MG tablet Take 10 mg by mouth daily.      . Cholecalciferol (VITAMIN D-3) 1000 UNITS CAPS Take 1 capsule by mouth daily.      . Cranberry-Cholecalciferol 4200-500 MG-UNIT CAPS Take by mouth.      . Cyanocobalamin 1000 MCG/ML KIT Inject as directed every 30 (thirty) days.      Marland Kitchen  doxycycline (ORACEA) 40 MG capsule Take 40 mg by mouth daily.      Marland Kitchen FLUoxetine (PROZAC) 20 MG capsule Take 1 capsule (20 mg total) by mouth daily.  30 capsule  3  . Fluticasone-Salmeterol (ADVAIR) 100-50 MCG/DOSE AEPB Inhale 2 puffs into the lungs 2 (two) times daily.       Marland Kitchen levothyroxine (SYNTHROID) 50 MCG tablet Take 1 tablet (50 mcg total) by mouth daily.  30 tablet  10  . losartan (COZAAR) 100 MG tablet Take 100 mg by mouth daily.      . methenamine (HIPREX) 1 G tablet Take 1 g by mouth 2 (two) times daily with a meal.      . mirabegron ER (MYRBETRIQ) 25 MG TB24 Take 25 mg by mouth daily.      . montelukast (SINGULAIR) 10 MG tablet Take 1 tablet (10 mg total) by mouth at bedtime.  30 tablet  5  . pantoprazole (PROTONIX) 40 MG tablet Take 1 tablet (40 mg total) by mouth daily.  30 tablet  3  . simvastatin (ZOCOR) 20 MG tablet Take 1 tablet (20 mg total) by mouth daily.  30 tablet  5   No current facility-administered medications on file prior to visit.    Review of Systems Patient denies any headache, lightheadedness or dizziness.  Sinus congestion, drainage and pressure as outlined.   No chest pain, tightness or palpitations.  Breathing relatively stable. Some congestion.  Cough productive.   No nausea or vomiting.  Some persistent acid reflux.  No abdominal pain or cramping.  No bowel change, such as diarrhea, constipation, BRBPR or melana.  Urinary symptoms have improved.  Was seeing Dr Casilda Carls for her bladder issues.  Returning to Dr Lavella Hammock.   No abx recently.         Objective:   Physical Exam  Filed Vitals:   04/17/13 1613  BP: 150/98  Pulse: 99  Temp: 99 F (37.2 C)   Blood pressure recheck:  138/86, pulse 59-49  79 year old female in no acute distress.   HEENT:  Nares- clear.  Oropharynx - without lesions. NECK:  Supple.  Nontender.  No audible bruit.  HEART:  Appears to be regular. LUNGS:  No crackles.  Some increased congestion.  Clears some with  coughing.  Some increased cough with expiration/forced expiration.   Respirations even and unlabored.   ABDOMEN:  Soft, nontender.  Bowel sounds present and normal.  No audible abdominal bruit  EXTREMITIES:  Swelling improved.   DP pulses palpable and equal bilaterally.  Some stasis changes.  No infection.   FEET:  No lesions.        Assessment & Plan:  MSK.  S/p knee surgery.  Has been released.   CARDIOVASCULAR.  Stable.    LOWER EXTREMITY SWELLING.  Felt to be related to venostasis.  Compression stockings.  Improved.   HEALTH MAINTENANCE.  Physical 08/14/12.  Colonoscopy 9/02 revealed diverticulosis.  Saw GI and they felt no further w/up warranted.  Previous bone density revealed osteopenia.  Mammogram 12/20/12 - recommended f/u views.  Follow up views 12/26/12 - Birads III.  Recommended f/u in 6 months.

## 2013-04-22 ENCOUNTER — Encounter: Payer: Self-pay | Admitting: Internal Medicine

## 2013-04-22 DIAGNOSIS — R05 Cough: Secondary | ICD-10-CM | POA: Insufficient documentation

## 2013-04-22 DIAGNOSIS — R059 Cough, unspecified: Secondary | ICD-10-CM | POA: Insufficient documentation

## 2013-04-22 NOTE — Assessment & Plan Note (Signed)
Continue vitamin D supplementation 

## 2013-04-22 NOTE — Assessment & Plan Note (Signed)
Saw Dr Cline.  On Allopurinol.    

## 2013-04-22 NOTE — Assessment & Plan Note (Signed)
Continue B12 injections.   

## 2013-04-22 NOTE — Assessment & Plan Note (Signed)
Has not required abx recently.  Continue to follow up with urology.   

## 2013-04-22 NOTE — Assessment & Plan Note (Signed)
Symptoms as outlined.  Treat with zpak as directed.  Prednisone taper starting at 60mg  and decreasing by 5mg  until off.  Continue inhalers as directed.  mucinex and flonase as directed.  Saline nasal spray as directed.  Follow closely.  Get her back in soon to reassess.  Any change or worsening symptoms, will need reevaluation.

## 2013-04-22 NOTE — Assessment & Plan Note (Signed)
Asymptomatic.  Varying pressures in each arm.  Stable.   

## 2013-04-22 NOTE — Assessment & Plan Note (Signed)
Continue simvastatin.  Low cholesterol diet.  Follow lipid panel and liver function.  Lipid panel 02/28/13 revealed total cholesterol 147, triglycerides 86, HDL 51 and LDL 78.   

## 2013-04-22 NOTE — Assessment & Plan Note (Signed)
Low carb diet and exercise.  Follow met b and a1c.  A1c just checked 02/28/13 - 6.5.  Keep up to date with eye checks.   

## 2013-04-22 NOTE — Assessment & Plan Note (Signed)
On replacement.  Follow.  

## 2013-04-22 NOTE — Assessment & Plan Note (Signed)
Symptoms controlled.  Follow.   

## 2013-05-08 DIAGNOSIS — Z961 Presence of intraocular lens: Secondary | ICD-10-CM | POA: Diagnosis not present

## 2013-05-08 DIAGNOSIS — H35369 Drusen (degenerative) of macula, unspecified eye: Secondary | ICD-10-CM | POA: Diagnosis not present

## 2013-05-30 ENCOUNTER — Encounter: Payer: Self-pay | Admitting: Internal Medicine

## 2013-05-30 ENCOUNTER — Ambulatory Visit (INDEPENDENT_AMBULATORY_CARE_PROVIDER_SITE_OTHER): Payer: Medicare Other | Admitting: Internal Medicine

## 2013-05-30 VITALS — BP 140/90 | HR 86 | Temp 98.0°F | Ht 66.5 in | Wt 192.0 lb

## 2013-05-30 DIAGNOSIS — E538 Deficiency of other specified B group vitamins: Secondary | ICD-10-CM

## 2013-05-30 DIAGNOSIS — M109 Gout, unspecified: Secondary | ICD-10-CM

## 2013-05-30 DIAGNOSIS — J988 Other specified respiratory disorders: Secondary | ICD-10-CM

## 2013-05-30 DIAGNOSIS — I10 Essential (primary) hypertension: Secondary | ICD-10-CM

## 2013-05-30 DIAGNOSIS — J069 Acute upper respiratory infection, unspecified: Secondary | ICD-10-CM

## 2013-05-30 DIAGNOSIS — R05 Cough: Secondary | ICD-10-CM

## 2013-05-30 DIAGNOSIS — R059 Cough, unspecified: Secondary | ICD-10-CM | POA: Diagnosis not present

## 2013-05-30 DIAGNOSIS — E119 Type 2 diabetes mellitus without complications: Secondary | ICD-10-CM

## 2013-05-30 DIAGNOSIS — M949 Disorder of cartilage, unspecified: Secondary | ICD-10-CM

## 2013-05-30 DIAGNOSIS — M8580 Other specified disorders of bone density and structure, unspecified site: Secondary | ICD-10-CM

## 2013-05-30 DIAGNOSIS — M899 Disorder of bone, unspecified: Secondary | ICD-10-CM

## 2013-05-30 DIAGNOSIS — Z87440 Personal history of urinary (tract) infections: Secondary | ICD-10-CM

## 2013-05-30 DIAGNOSIS — E039 Hypothyroidism, unspecified: Secondary | ICD-10-CM

## 2013-05-30 DIAGNOSIS — E559 Vitamin D deficiency, unspecified: Secondary | ICD-10-CM

## 2013-05-30 DIAGNOSIS — K219 Gastro-esophageal reflux disease without esophagitis: Secondary | ICD-10-CM

## 2013-05-30 DIAGNOSIS — I739 Peripheral vascular disease, unspecified: Secondary | ICD-10-CM

## 2013-05-30 DIAGNOSIS — E7800 Pure hypercholesterolemia, unspecified: Secondary | ICD-10-CM

## 2013-05-30 MED ORDER — CYANOCOBALAMIN 1000 MCG/ML IJ SOLN
1000.0000 ug | Freq: Once | INTRAMUSCULAR | Status: AC
  Administered 2013-05-30: 1000 ug via INTRAMUSCULAR

## 2013-06-03 ENCOUNTER — Encounter: Payer: Self-pay | Admitting: Internal Medicine

## 2013-06-03 NOTE — Progress Notes (Signed)
Subjective:    [name], female    DOB: 1926/10/09, 80 y.o.   MRN: [id number]  HPI 81 year old female with past history of colonic polyps, vascular disease, hypertension, reoccurring urinary tract infections and hypercholesterolemia who comes in today for a scheduled follow up.  She had knee surgery 8/13 (by Dr Leanor Kail).  Had therapy.  Feeling stronger now.   Has been seeing Dr Beather Arbour recently.  Now returning back to Dr [place] Pink.  Was started on Myrbetriq.  Also took hipprex.  Bladder symptoms have improved.  No recent infections.   No cardiac symptoms with increased activity or exertion.  Breathing better.  Was treated for a respiratory infection last visit.  Feels better.   Bowels stable.  No abdominal pain or cramping.  Energy improved.  Has been seeing Dr Raul Del.  States he increased her advair to 250 bid.  Using this now.  Overall feels better.  Blood pressure elevated.  Her checks 157/91 and 151/98.        Past Medical History  Diagnosis Date  . Hypertension   . Hypercholesterolemia   . Peripheral vascular disease   . GERD (gastroesophageal reflux disease)     hiatal hernia  . History of frequent urinary tract infections   . Diabetes mellitus   . History of colon polyps   . Hematuria     followed by urology  . Gout   . Humeral fracture 9/09    comminuted impacted proximal    Current Outpatient Prescriptions on File Prior to Visit  Medication Sig Dispense Refill  . allopurinol (ZYLOPRIM) 100 MG tablet Take 1 tablet (100 mg total) by mouth daily.  30 tablet  5  . Calcium Carbonate-Vitamin D (CALCIUM 600+D) 600-400 MG-UNIT per tablet Take 1 tablet by mouth daily.       . cetirizine (ZYRTEC) 10 MG tablet Take 10 mg by mouth daily.      . Cholecalciferol (VITAMIN D-3) 1000 UNITS CAPS Take 1 capsule by mouth daily.      . Cranberry-Cholecalciferol 4200-500 MG-UNIT CAPS Take by mouth.      . Cyanocobalamin 1000 MCG/ML KIT Inject as directed every 30  (thirty) days.      Marland Kitchen doxycycline (ORACEA) 40 MG capsule Take 40 mg by mouth daily.      Marland Kitchen FLUoxetine (PROZAC) 20 MG capsule Take 1 capsule (20 mg total) by mouth daily.  30 capsule  3  . Fluticasone-Salmeterol (ADVAIR) 100-50 MCG/DOSE AEPB Inhale 2 puffs into the lungs 2 (two) times daily.       Marland Kitchen levothyroxine (SYNTHROID) 50 MCG tablet Take 1 tablet (50 mcg total) by mouth daily.  30 tablet  10  . losartan (COZAAR) 100 MG tablet Take 50 mg by mouth daily.       . methenamine (HIPREX) 1 G tablet Take 1 g by mouth 2 (two) times daily with a meal.      . mirabegron ER (MYRBETRIQ) 25 MG TB24 Take 25 mg by mouth daily.      . montelukast (SINGULAIR) 10 MG tablet Take 1 tablet (10 mg total) by mouth at bedtime.  30 tablet  5  . pantoprazole (PROTONIX) 40 MG tablet Take 1 tablet (40 mg total) by mouth daily.  30 tablet  3  . predniSONE (DELTASONE) 10 MG tablet Take 6 tablets x 1 day and then decrease by 1/2 tablet per day until down to zero mg.  39 tablet  0  . simvastatin (ZOCOR)  20 MG tablet Take 1 tablet (20 mg total) by mouth daily.  30 tablet  5   No current facility-administered medications on file prior to visit.    Review of Systems Patient denies any headache, lightheadedness or dizziness.  Some minimal congestion, but overall feels better.  cough decreased.  No chest pain, tightness or palpitations.  Breathing relatively stable.  No nausea or vomiting.   No abdominal pain or cramping.  No bowel change, such as diarrhea, constipation, BRBPR or melana.  Urinary symptoms have improved.  Was seeing Dr Jonetta Speak for her bladder issues.  Returning to Dr [place] Pink.  Blood pressures elevated.        Objective:   Physical Exam  Filed Vitals:   05/30/13 1206  BP: 140/90  Pulse: 86  Temp: 98 F (36.7 C)   Blood pressure recheck:  71/15  80 year old female in no acute distress.   HEENT:  Nares- clear.  Oropharynx - without lesions. NECK:  Supple.  Nontender.  No audible bruit.   HEART:  Appears to be regular. LUNGS:  No crackles.  No wheezing.  Respirations even and unlabored.   ABDOMEN:  Soft, nontender.  Bowel sounds present and normal.  No audible abdominal bruit  EXTREMITIES:  Swelling improved.   DP pulses palpable and equal bilaterally.  Some stasis changes.  No infection.   FEET:  No lesions.        Assessment & Plan:  MSK.  S/p knee surgery.  Has been released.   CARDIOVASCULAR.  Stable.    LOWER EXTREMITY SWELLING.  Felt to be related to venostasis.  Compression stockings.  Improved.   HEALTH MAINTENANCE.  Physical 08/14/12.  Colonoscopy 9/02 revealed diverticulosis.  Saw GI and they felt no further w/up warranted.  Previous bone density revealed osteopenia.  Mammogram 12/20/12 - recommended f/u views.  Follow up views 12/26/12 - Birads III.  Recommended f/u in 6 months.

## 2013-06-03 NOTE — Assessment & Plan Note (Signed)
Has not required abx recently.  Continue to follow up with urology.

## 2013-06-03 NOTE — Assessment & Plan Note (Signed)
Low carb diet and exercise.  Follow met b and a1c.  A1c just checked 02/28/13 - 6.5.  Keep up to date with eye checks.

## 2013-06-03 NOTE — Assessment & Plan Note (Signed)
Blood pressure elevated.  Will increase losartan to 100mg  q day.  Follow pressures.   Follow metabolic panel.  Stay hydrated.

## 2013-06-03 NOTE — Assessment & Plan Note (Signed)
On replacement.  Follow.  

## 2013-06-03 NOTE — Assessment & Plan Note (Signed)
Continue vitamin D supplementation 

## 2013-06-03 NOTE — Assessment & Plan Note (Signed)
Symptoms controlled.  Follow.   

## 2013-06-03 NOTE — Assessment & Plan Note (Addendum)
Symptoms better.  Feels better.  Desires no further w/up.  Did agree to a cxr.  Follow.

## 2013-06-03 NOTE — Assessment & Plan Note (Signed)
Asymptomatic.  Varying pressures in each arm.  Stable.   

## 2013-06-03 NOTE — Assessment & Plan Note (Signed)
Saw Dr Cline.  On Allopurinol.    

## 2013-06-03 NOTE — Assessment & Plan Note (Signed)
Continue simvastatin.  Low cholesterol diet.  Follow lipid panel and liver function.  Lipid panel 02/28/13 revealed total cholesterol 147, triglycerides 86, HDL 51 and LDL 78.

## 2013-06-03 NOTE — Assessment & Plan Note (Signed)
Continue B12 injections.   

## 2013-06-12 ENCOUNTER — Other Ambulatory Visit: Payer: Self-pay | Admitting: *Deleted

## 2013-06-12 ENCOUNTER — Other Ambulatory Visit: Payer: Medicare Other

## 2013-06-13 MED ORDER — LOSARTAN POTASSIUM 100 MG PO TABS: 50.0000 mg | ORAL_TABLET | Freq: Every day | ORAL | Status: DC

## 2013-06-14 ENCOUNTER — Other Ambulatory Visit (INDEPENDENT_AMBULATORY_CARE_PROVIDER_SITE_OTHER): Payer: Medicare Other

## 2013-06-14 DIAGNOSIS — E039 Hypothyroidism, unspecified: Secondary | ICD-10-CM | POA: Diagnosis not present

## 2013-06-14 DIAGNOSIS — I10 Essential (primary) hypertension: Secondary | ICD-10-CM | POA: Diagnosis not present

## 2013-06-14 DIAGNOSIS — E119 Type 2 diabetes mellitus without complications: Secondary | ICD-10-CM

## 2013-06-14 DIAGNOSIS — E7800 Pure hypercholesterolemia, unspecified: Secondary | ICD-10-CM

## 2013-06-14 LAB — BASIC METABOLIC PANEL
BUN: 19 mg/dL (ref 6–23)
CO2: 27 mEq/L (ref 19–32)
Calcium: 9.7 mg/dL (ref 8.4–10.5)
Chloride: 106 mEq/L (ref 96–112)
Creatinine, Ser: 1.5 mg/dL — ABNORMAL HIGH (ref 0.4–1.2)
GFR: 35.38 mL/min — ABNORMAL LOW (ref >60.00)
Glucose, Bld: 135 mg/dL — ABNORMAL HIGH (ref 70–99)
POTASSIUM: 4.9 meq/L (ref 3.5–5.1)
Sodium: 141 mEq/L (ref 135–145)

## 2013-06-14 LAB — LIPID PANEL
CHOLESTEROL: 160 mg/dL (ref 0–200)
HDL: 48.3 mg/dL (ref >39.00)
LDL CALC: 90 mg/dL (ref 0–99)
TRIGLYCERIDES: 111 mg/dL (ref 0.0–149.0)
Total CHOL/HDL Ratio: 3
VLDL: 22.2 mg/dL (ref 0.0–40.0)

## 2013-06-14 LAB — HEPATIC FUNCTION PANEL
ALBUMIN: 3.5 g/dL (ref 3.5–5.2)
ALT: 15 U/L (ref 0–35)
AST: 21 U/L (ref 0–37)
Alkaline Phosphatase: 58 U/L (ref 39–117)
Bilirubin, Direct: 0.1 mg/dL (ref 0.0–0.3)
TOTAL PROTEIN: 6.6 g/dL (ref 6.0–8.3)
Total Bilirubin: 0.6 mg/dL (ref 0.3–1.2)

## 2013-06-14 LAB — TSH: TSH: 2.39 u[IU]/mL (ref 0.35–5.50)

## 2013-06-14 LAB — HEMOGLOBIN A1C: HEMOGLOBIN A1C: 6.8 % — AB (ref 4.6–6.5)

## 2013-06-15 ENCOUNTER — Encounter: Payer: Self-pay | Admitting: Internal Medicine

## 2013-06-19 NOTE — Telephone Encounter (Signed)
Mailed unread message to pt  

## 2013-07-03 ENCOUNTER — Ambulatory Visit (INDEPENDENT_AMBULATORY_CARE_PROVIDER_SITE_OTHER): Payer: Medicare Other | Admitting: *Deleted

## 2013-07-03 ENCOUNTER — Ambulatory Visit: Payer: Medicare Other

## 2013-07-03 DIAGNOSIS — E538 Deficiency of other specified B group vitamins: Secondary | ICD-10-CM

## 2013-07-03 MED ORDER — CYANOCOBALAMIN 1000 MCG/ML IJ SOLN
1000.0000 ug | Freq: Once | INTRAMUSCULAR | Status: AC
  Administered 2013-07-03: 1000 ug via INTRAMUSCULAR

## 2013-07-18 ENCOUNTER — Other Ambulatory Visit: Payer: Self-pay | Admitting: *Deleted

## 2013-07-18 MED ORDER — PANTOPRAZOLE SODIUM 40 MG PO TBEC: 40.0000 mg | DELAYED_RELEASE_TABLET | Freq: Every day | ORAL | Status: DC

## 2013-07-31 ENCOUNTER — Encounter: Payer: Self-pay | Admitting: Internal Medicine

## 2013-07-31 ENCOUNTER — Ambulatory Visit (INDEPENDENT_AMBULATORY_CARE_PROVIDER_SITE_OTHER): Payer: Medicare Other | Admitting: Internal Medicine

## 2013-07-31 ENCOUNTER — Ambulatory Visit (INDEPENDENT_AMBULATORY_CARE_PROVIDER_SITE_OTHER)
Admission: RE | Admit: 2013-07-31 | Discharge: 2013-07-31 | Disposition: A | Discharge status: Home / Self Care | Payer: Medicare Other | Source: Ambulatory Visit | Attending: Internal Medicine | Admitting: Internal Medicine

## 2013-07-31 VITALS — BP 110/60 | HR 88 | Temp 97.6°F | Ht 66.5 in | Wt 193.0 lb

## 2013-07-31 DIAGNOSIS — R059 Cough, unspecified: Secondary | ICD-10-CM

## 2013-07-31 DIAGNOSIS — I10 Essential (primary) hypertension: Secondary | ICD-10-CM

## 2013-07-31 DIAGNOSIS — I739 Peripheral vascular disease, unspecified: Secondary | ICD-10-CM | POA: Diagnosis not present

## 2013-07-31 DIAGNOSIS — R600 Localized edema: Secondary | ICD-10-CM

## 2013-07-31 DIAGNOSIS — J988 Other specified respiratory disorders: Secondary | ICD-10-CM | POA: Diagnosis not present

## 2013-07-31 DIAGNOSIS — K219 Gastro-esophageal reflux disease without esophagitis: Secondary | ICD-10-CM

## 2013-07-31 DIAGNOSIS — R0602 Shortness of breath: Secondary | ICD-10-CM | POA: Diagnosis not present

## 2013-07-31 DIAGNOSIS — R05 Cough: Secondary | ICD-10-CM

## 2013-07-31 DIAGNOSIS — R609 Edema, unspecified: Secondary | ICD-10-CM

## 2013-07-31 DIAGNOSIS — M7989 Other specified soft tissue disorders: Secondary | ICD-10-CM | POA: Diagnosis not present

## 2013-07-31 DIAGNOSIS — J069 Acute upper respiratory infection, unspecified: Secondary | ICD-10-CM

## 2013-07-31 DIAGNOSIS — E119 Type 2 diabetes mellitus without complications: Secondary | ICD-10-CM

## 2013-07-31 MED ORDER — ALBUTEROL SULFATE HFA 108 (90 BASE) MCG/ACT IN AERS: 2.0000 | INHALATION_SPRAY | Freq: Four times a day (QID) | RESPIRATORY_TRACT | Status: DC | PRN

## 2013-07-31 NOTE — Assessment & Plan Note (Addendum)
Increased losartan to 100mg  q day last visit.   Pressures better.  Follow pressures.   Follow metabolic panel.  Stay hydrated.

## 2013-07-31 NOTE — Progress Notes (Signed)
Pre-visit discussion using our clinic review tool. No additional management support is needed unless otherwise documented below in the visit note.  

## 2013-07-31 NOTE — Progress Notes (Signed)
Subjective:    [name], female    DOB: 12/22/1926, 80 y.o.   MRN: [id number]  HPI 80 year old female with past history of colonic polyps, vascular disease, hypertension, reoccurring urinary tract infections and hypercholesterolemia who comes in today for a scheduled follow up.  She had knee surgery 8/13 (by Dr Leanor Kail).  Had therapy.  Feeling stronger now.   Has been seeing Dr Beather Arbour recently.  Now returning back to Dr [place] Pink.  Was started on Myrbetriq.  Also took hipprex.  Bladder symptoms have improved.  No recent infections.   No cardiac symptoms with increased activity or exertion.  Bowels stable.  No abdominal pain or cramping.  Has been seeing Dr Raul Del.  States he increased her advair to 250 bid.  Using this now.  Does report increased cough and chest congestion.  Persistent.  Worsening.  Blood pressure better.  Tolerating the losartan 146m q day.  Increased lower extremity swelling.      Past Medical History  Diagnosis Date  . Hypertension   . Hypercholesterolemia   . Peripheral vascular disease   . GERD (gastroesophageal reflux disease)     hiatal hernia  . History of frequent urinary tract infections   . Diabetes mellitus   . History of colon polyps   . Hematuria     followed by urology  . Gout   . Humeral fracture 9/09    comminuted impacted proximal    Current Outpatient Prescriptions on File Prior to Visit  Medication Sig Dispense Refill  . allopurinol (ZYLOPRIM) 100 MG tablet Take 1 tablet (100 mg total) by mouth daily.  30 tablet  5  . Calcium Carbonate-Vitamin D (CALCIUM 600+D) 600-400 MG-UNIT per tablet Take 1 tablet by mouth daily.       . cetirizine (ZYRTEC) 10 MG tablet Take 10 mg by mouth daily.      . Cholecalciferol (VITAMIN D-3) 1000 UNITS CAPS Take 1 capsule by mouth daily.      . Cranberry-Cholecalciferol 4200-500 MG-UNIT CAPS Take by mouth.      . Cyanocobalamin 1000 MCG/ML KIT Inject as directed every 30 (thirty) days.       .Marland Kitchendoxycycline (ORACEA) 40 MG capsule Take 40 mg by mouth daily.      .Marland KitchenFLUoxetine (PROZAC) 20 MG capsule Take 1 capsule (20 mg total) by mouth daily.  30 capsule  3  . Fluticasone-Salmeterol (ADVAIR) 100-50 MCG/DOSE AEPB Inhale 2 puffs into the lungs 2 (two) times daily.       .Marland Kitchenlevothyroxine (SYNTHROID) 50 MCG tablet Take 1 tablet (50 mcg total) by mouth daily.  30 tablet  10  . methenamine (HIPREX) 1 G tablet Take 1 g by mouth 2 (two) times daily with a meal.      . mirabegron ER (MYRBETRIQ) 25 MG TB24 Take 25 mg by mouth daily.      . montelukast (SINGULAIR) 10 MG tablet Take 1 tablet (10 mg total) by mouth at bedtime.  30 tablet  5  . pantoprazole (PROTONIX) 40 MG tablet Take 1 tablet (40 mg total) by mouth daily.  30 tablet  5  . simvastatin (ZOCOR) 20 MG tablet Take 1 tablet (20 mg total) by mouth daily.  30 tablet  5   No current facility-administered medications on file prior to visit.    Review of Systems Patient denies any headache, lightheadedness or dizziness.  Increased chest congestion and cough.  Persistent.  Worsening.   No chest pain,  tightness or palpitations.  No nausea or vomiting.   No abdominal pain or cramping.  No bowel change, such as diarrhea, constipation, BRBPR or melana.  Urinary symptoms have improved.  Was seeing Dr Jonetta Speak for her bladder issues.  Returning to Dr [place] Pink.  Blood pressures better.         Objective:   Physical Exam  Filed Vitals:   07/31/13 1005  BP: 110/60  Pulse: 88  Temp: 97.6 F (36.4 C)   Blood pressure recheck:  32/106  80 year old female in no acute distress.   HEENT:  Nares- clear.  Oropharynx - without lesions.  No tenderness to palpation over the sinuses.  NECK:  Supple.  Nontender.  No audible bruit.  HEART:  Appears to be regular. LUNGS:  No crackles.  Increased congestion.  Clears with coughing.  Some increased cough with forced expiration.   ABDOMEN:  Soft, nontender.  Bowel sounds present and normal.  No  audible abdominal bruit  EXTREMITIES:  Increased swelling - left leg worse than right.    DP pulses palpable and equal bilaterally.  Some stasis changes.  No infection.   FEET:  No lesions.        Assessment & Plan:  MSK.  S/p knee surgery.  Has been released.   CARDIOVASCULAR.  Stable.    HEALTH MAINTENANCE.  Physical 08/14/12.  Colonoscopy 9/02 revealed diverticulosis.  Saw GI and they felt no further w/up warranted.  Previous bone density revealed osteopenia.  Mammogram 12/20/12 - recommended f/u views.  Follow up views 12/26/12 - Birads III.  Recommended f/u in 6 months.    I spent 40 minutes with the patient and more than 50% of the time was spent in consultation regarding the above.

## 2013-08-01 ENCOUNTER — Ambulatory Visit: Payer: Medicare Other

## 2013-08-01 ENCOUNTER — Encounter: Payer: Self-pay | Admitting: *Deleted

## 2013-08-03 ENCOUNTER — Telehealth: Payer: Self-pay | Admitting: Internal Medicine

## 2013-08-03 MED ORDER — AZITHROMYCIN 250 MG PO TABS: ORAL_TABLET | ORAL | Status: DC

## 2013-08-03 NOTE — Telephone Encounter (Signed)
Pt notified of xray results.  Treated with azithromycin.  Multiple drug allergies.  Will let me know if any problems.  Has taken this previously and tolerated.

## 2013-08-05 ENCOUNTER — Telehealth: Payer: Self-pay | Admitting: Internal Medicine

## 2013-08-05 ENCOUNTER — Encounter: Payer: Self-pay | Admitting: Internal Medicine

## 2013-08-05 DIAGNOSIS — R600 Localized edema: Secondary | ICD-10-CM | POA: Insufficient documentation

## 2013-08-05 NOTE — Assessment & Plan Note (Signed)
Symptoms controlled.  Follow.   

## 2013-08-05 NOTE — Assessment & Plan Note (Addendum)
Persistent cough and congestion and sob as outlined.  Never had her cxr from last visit.  Will check cxr today.  Continue inhalers as she is doing.  Mucinex DM/robitussin as directed.  Follow.  Further w/up pending results of the cxr.  Has been seeing Dr Meredeth IdeFleming.  Desires to change to pulmonary here.  Arrange appt with Dr Kendrick FriesMcQuaid.

## 2013-08-05 NOTE — Assessment & Plan Note (Signed)
Has had trouble with lower extremity edema.  Left leg more swollen today.  Will check lower extremity duplex to confirm no clot.  Leg elevation and support hose.

## 2013-08-05 NOTE — Assessment & Plan Note (Signed)
Asymptomatic.  Varying pressures in each arm.  Stable.   

## 2013-08-05 NOTE — Telephone Encounter (Signed)
I had a 6 month f/u left breast mammo ordered on this pt.  Should have been done in 2/15.  Has this been scheduled?  If no, will you schedule and contact her with an appt date and time.  Needs the f/u mammogram.  Let me know if a problem.   Thanks.   Dr Lorin PicketScott

## 2013-08-05 NOTE — Assessment & Plan Note (Signed)
Low carb diet and exercise.  Follow met b and a1c.  Keep up to date with eye checks.   

## 2013-08-06 ENCOUNTER — Ambulatory Visit: Payer: Medicare Other

## 2013-08-06 ENCOUNTER — Ambulatory Visit: Payer: Medicare Other | Admitting: Pulmonary Disease

## 2013-08-06 NOTE — Telephone Encounter (Signed)
Notify pt that she needs to reschedule.  Order is placed and they had her scheduled.

## 2013-08-06 NOTE — Telephone Encounter (Signed)
Pt.notified

## 2013-08-06 NOTE — Telephone Encounter (Signed)
Records requested

## 2013-08-06 NOTE — Telephone Encounter (Signed)
Need results if done.  If not done, notify her she needs to reschedule.

## 2013-08-06 NOTE — Telephone Encounter (Signed)
Looks like patient should have gone for this on 06/28/13.

## 2013-08-06 NOTE — Telephone Encounter (Signed)
Received a response back stating that they have no records of 6 mth f/u mammo last month

## 2013-08-13 ENCOUNTER — Ambulatory Visit (INDEPENDENT_AMBULATORY_CARE_PROVIDER_SITE_OTHER): Payer: Medicare Other | Admitting: Pulmonary Disease

## 2013-08-13 ENCOUNTER — Encounter: Payer: Self-pay | Admitting: Pulmonary Disease

## 2013-08-13 VITALS — BP 126/84 | HR 88 | Ht 66.5 in | Wt 196.0 lb

## 2013-08-13 DIAGNOSIS — R0602 Shortness of breath: Secondary | ICD-10-CM | POA: Diagnosis not present

## 2013-08-13 DIAGNOSIS — J449 Chronic obstructive pulmonary disease, unspecified: Secondary | ICD-10-CM

## 2013-08-13 NOTE — Progress Notes (Signed)
Subjective:    [name], female    DOB: 1927-05-12, 80 y.o.   MRN: [id number]  HPI  Chief Complaint  Patient presents with  . Advice Only    Referred by Dr Nicki Reaper for continued pulmonary care.  Was seeing Dr. Raul Del.  Pt c/o prod cough with white mucous, SOB with exertion, sinus congestion with PND and rhinorrhea.     [name] is here to see me today for shortness of breath.  She has prevoiusly been seen and treated by Dr. Vella Kohler in [place] with Advair.  She has not seen him in a few years and would like to establish care with Diamond Newman.    She continues to take Advair regularly.  Lately she has been feeling chest congestion and mucus in her chest which has been bothering her.  She has been wheezing more often when she coughs up some.  She does not dyspnea t times, particularly if she tries to walk fast or exert herself.  She had knee surgery back in August 2014 and was doing well when she was active with physical therapy.  She has been fairly sedentary since completing PT and has noted increasing dyspnea with heavy activity since then.  She has someone who helps her with housework.  She can make it through the grocery store but it makes her winded.  She cannot climb a flight of stairs without trouble breathing.    She is unaware of a diagnosis of COPD or Asthma.    She currently uses Advair regularly as prescribed and advair 1-3 times per day.  She uses albuterol more often she feels more congestion.    Her childhood was normal without respiratory illnesses; she doesn't recall having pneumonia.   She recently took a course of azithromycin for the chest congestion.     Past Medical History  Diagnosis Date  . Hypertension   . Hypercholesterolemia   . Peripheral vascular disease   . GERD (gastroesophageal reflux disease)     hiatal hernia  . History of frequent urinary tract infections   . Diabetes mellitus   . History of colon polyps   . Hematuria     followed by  urology  . Gout   . Humeral fracture 9/09    comminuted impacted proximal     Family History  Problem Relation Age of Onset  . Hypertension Father   . Hypertension Mother   . Colitis Mother   . Breast cancer Neg Hx   . Colon cancer Neg Hx      History   Social History  . Marital Status: Widowed    Spouse Name: N/A    Number of Children: 2  . Years of Education: N/A   Occupational History  . Not on file.   Social History Main Topics  . Smoking status: Never Smoker   . Smokeless tobacco: Never Used  . Alcohol Use: No  . Drug Use: No  . Sexual Activity: Not on file   Other Topics Concern  . Not on file   Social History Narrative  . No narrative on file     Allergies  Allergen Reactions  . Ace Inhibitors Cough  . Amoxicillin   . Codeine   . Sulfa Antibiotics Other (See Comments)    GI upset  . Tetracyclines & Related   . Vibramycin [Doxycycline Calcium]   . Ciprofloxacin Hcl Rash     Outpatient Prescriptions Prior to Visit  Medication Sig Dispense Refill  .  albuterol (PROVENTIL HFA;VENTOLIN HFA) 108 (90 BASE) MCG/ACT inhaler Inhale 2 puffs into the lungs every 6 (six) hours as needed for wheezing or shortness of breath.  1 Inhaler  1  . allopurinol (ZYLOPRIM) 100 MG tablet Take 1 tablet (100 mg total) by mouth daily.  30 tablet  5  . Calcium Carbonate-Vitamin D (CALCIUM 600+D) 600-400 MG-UNIT per tablet Take 1 tablet by mouth daily.       . cetirizine (ZYRTEC) 10 MG tablet Take 10 mg by mouth daily.      . Cholecalciferol (VITAMIN D-3) 1000 UNITS CAPS Take 1 capsule by mouth daily.      . Cranberry-Cholecalciferol 4200-500 MG-UNIT CAPS Take by mouth.      . Cyanocobalamin 1000 MCG/ML KIT Inject as directed every 30 (thirty) days.      Marland Kitchen FLUoxetine (PROZAC) 20 MG capsule Take 1 capsule (20 mg total) by mouth daily.  30 capsule  3  . Fluticasone-Salmeterol (ADVAIR) 100-50 MCG/DOSE AEPB Inhale 2 puffs into the lungs 2 (two) times daily.       Marland Kitchen levothyroxine  (SYNTHROID) 50 MCG tablet Take 1 tablet (50 mcg total) by mouth daily.  30 tablet  10  . losartan (COZAAR) 100 MG tablet Take 100 mg by mouth daily.      . methenamine (HIPREX) 1 G tablet Take 1 g by mouth 2 (two) times daily with a meal.      . mirabegron ER (MYRBETRIQ) 25 MG TB24 Take 25 mg by mouth daily.      . montelukast (SINGULAIR) 10 MG tablet Take 1 tablet (10 mg total) by mouth at bedtime.  30 tablet  5  . pantoprazole (PROTONIX) 40 MG tablet Take 1 tablet (40 mg total) by mouth daily.  30 tablet  5  . simvastatin (ZOCOR) 20 MG tablet Take 1 tablet (20 mg total) by mouth daily.  30 tablet  5  . azithromycin (ZITHROMAX) 250 MG tablet Take 2 tablets x 1 day and the one tablet per day for four more days.  6 tablet  0  . doxycycline (ORACEA) 40 MG capsule Take 40 mg by mouth daily.       No facility-administered medications prior to visit.      Review of Systems  Constitutional: Negative for fever and unexpected weight change.  HENT: Positive for congestion, postnasal drip, rhinorrhea and sinus pressure. Negative for dental problem, ear pain, nosebleeds, sneezing, sore throat and trouble swallowing.   Eyes: Negative for redness and itching.  Respiratory: Positive for cough and shortness of breath. Negative for chest tightness and wheezing.   Cardiovascular: Negative for palpitations and leg swelling.  Gastrointestinal: Negative for nausea and vomiting.  Genitourinary: Negative for dysuria.  Musculoskeletal: Negative for joint swelling.  Skin: Negative for rash.  Neurological: Negative for headaches.  Hematological: Does not bruise/bleed easily.  Psychiatric/Behavioral: Negative for dysphoric mood. The patient is not nervous/anxious.        Objective:   Physical Exam Filed Vitals:   08/13/13 1430  BP: 126/84  Pulse: 88  Height: 5' 6.5" (1.689 m)  Weight: 196 lb (88.905 kg)  SpO2: 94%   RA  Gen: elderly female, no acute distress HEENT: NCAT, PERRL, EOMi, OP clear, neck  supple without masses PULM: Wheezing bilaterally, mildly reduced air movement CV: RRR, slight systolic murmur, no JVD AB: BS+, soft, nontender, no hsm Ext: warm, no edema, no clubbing, no cyanosis Derm: no rash or skin breakdown Neuro: A&Ox4, CN II-XII intact, strength 5/5 in  all 4 extremities  07/31/2013 chest x-ray> by basilar atelectasis which is mild, no clear infiltrate, no other pulmonary parenchymal process.    Assessment & Plan:   Shortness of breath Mrs. Sinnett's shortness of breath with associated chest tightness and mucus production is worrisome for an airways process such as asthma or COPD. However, it would be unusual for her to have COPD considering that she never smoked. At this point, because she has increasing chest tightness and dyspnea and seems to respond to prednisone and antibiotics I am going to continue to treat her as an asthmatic. In that vein I think she needs to have a higher dose of inhaled steroid at this time.  I do not see anything on the recent chest x-ray which was worrisome for pneumonia or other acute process.  Plan: -Full pulmonary function test -Increase Advair to 500/50 -Followup 2-3 months    Updated Medication List Outpatient Encounter Prescriptions as of 08/13/2013  Medication Sig  . albuterol (PROVENTIL HFA;VENTOLIN HFA) 108 (90 BASE) MCG/ACT inhaler Inhale 2 puffs into the lungs every 6 (six) hours as needed for wheezing or shortness of breath.  . allopurinol (ZYLOPRIM) 100 MG tablet Take 1 tablet (100 mg total) by mouth daily.  . Calcium Carbonate-Vitamin D (CALCIUM 600+D) 600-400 MG-UNIT per tablet Take 1 tablet by mouth daily.   . cetirizine (ZYRTEC) 10 MG tablet Take 10 mg by mouth daily.  . Cholecalciferol (VITAMIN D-3) 1000 UNITS CAPS Take 1 capsule by mouth daily.  . Cranberry-Cholecalciferol 4200-500 MG-UNIT CAPS Take by mouth.  . Cyanocobalamin 1000 MCG/ML KIT Inject as directed every 30 (thirty) days.  Marland Kitchen FLUoxetine (PROZAC) 20 MG  capsule Take 1 capsule (20 mg total) by mouth daily.  . Fluticasone-Salmeterol (ADVAIR) 100-50 MCG/DOSE AEPB Inhale 2 puffs into the lungs 2 (two) times daily.   Marland Kitchen levothyroxine (SYNTHROID) 50 MCG tablet Take 1 tablet (50 mcg total) by mouth daily.  Marland Kitchen losartan (COZAAR) 100 MG tablet Take 100 mg by mouth daily.  . methenamine (HIPREX) 1 G tablet Take 1 g by mouth 2 (two) times daily with a meal.  . mirabegron ER (MYRBETRIQ) 25 MG TB24 Take 25 mg by mouth daily.  . montelukast (SINGULAIR) 10 MG tablet Take 1 tablet (10 mg total) by mouth at bedtime.  . pantoprazole (PROTONIX) 40 MG tablet Take 1 tablet (40 mg total) by mouth daily.  . simvastatin (ZOCOR) 20 MG tablet Take 1 tablet (20 mg total) by mouth daily.  . [DISCONTINUED] azithromycin (ZITHROMAX) 250 MG tablet Take 2 tablets x 1 day and the one tablet per day for four more days.  . [DISCONTINUED] doxycycline (ORACEA) 40 MG capsule Take 40 mg by mouth daily.

## 2013-08-13 NOTE — Patient Instructions (Signed)
Stop taking the Advair 250/50 and replace it with Advair 500/50 twice per day We will arrange [facility] We will see you back in [place] in 2-3 months or sooner if needed

## 2013-08-14 DIAGNOSIS — J449 Chronic obstructive pulmonary disease, unspecified: Secondary | ICD-10-CM | POA: Insufficient documentation

## 2013-08-14 NOTE — Assessment & Plan Note (Addendum)
Diamond Newman's shortness of breath with associated chest tightness and mucus production is worrisome for an airways process such as asthma or COPD. However, it would be unusual for her to have COPD considering that she never smoked. At this point, because she has increasing chest tightness and dyspnea and seems to respond to prednisone and antibiotics I am going to continue to treat her as an asthmatic. In that vein I think she needs to have a higher dose of inhaled steroid at this time.  I do not see anything on the recent chest x-ray which was worrisome for pneumonia or other acute process.  Plan: -Full pulmonary function test -Increase Advair to 500/50 -Followup 2-3 months

## 2013-08-15 ENCOUNTER — Ambulatory Visit (INDEPENDENT_AMBULATORY_CARE_PROVIDER_SITE_OTHER): Payer: Medicare Other | Admitting: *Deleted

## 2013-08-15 ENCOUNTER — Telehealth: Payer: Self-pay | Admitting: Internal Medicine

## 2013-08-15 DIAGNOSIS — E538 Deficiency of other specified B group vitamins: Secondary | ICD-10-CM

## 2013-08-15 MED ORDER — CYANOCOBALAMIN 1000 MCG/ML IJ SOLN: 1000.0000 ug | Freq: Once | INTRAMUSCULAR | Status: DC

## 2013-08-15 MED ORDER — CYANOCOBALAMIN 1000 MCG/ML IJ SOLN
1000.0000 ug | Freq: Once | INTRAMUSCULAR | Status: AC
  Administered 2013-08-15: 1000 ug via INTRAMUSCULAR

## 2013-08-15 NOTE — Telephone Encounter (Signed)
Okay to fill? 

## 2013-08-15 NOTE — Telephone Encounter (Signed)
She is followed by urology.  They prescribe this medication.

## 2013-08-15 NOTE — Telephone Encounter (Signed)
mirabegron ER (MYRBETRIQ) 25 MG TB24

## 2013-08-16 ENCOUNTER — Other Ambulatory Visit: Payer: Self-pay | Admitting: *Deleted

## 2013-08-16 MED ORDER — MIRABEGRON ER 25 MG PO TB24: 25.0000 mg | ORAL_TABLET | Freq: Every day | ORAL | Status: DC

## 2013-08-16 NOTE — Telephone Encounter (Signed)
Pt notified & I also placed some samples up front for pt to pick up.

## 2013-08-16 NOTE — Telephone Encounter (Signed)
I am ok to refill x 2.  She will need to get established with urology given her reoccurring issues.

## 2013-08-16 NOTE — Telephone Encounter (Signed)
Pt states that her Urologist has left town & she only has enough to last until Friday. She doesn't have anyone to fill this. She only takes one a day & she has been on it for 6 months to one year now.

## 2013-08-20 ENCOUNTER — Ambulatory Visit: Payer: Self-pay | Admitting: Pulmonary Disease

## 2013-08-20 DIAGNOSIS — R0602 Shortness of breath: Secondary | ICD-10-CM | POA: Diagnosis not present

## 2013-08-23 ENCOUNTER — Other Ambulatory Visit: Payer: Self-pay | Admitting: *Deleted

## 2013-08-23 ENCOUNTER — Encounter: Payer: Self-pay | Admitting: Pulmonary Disease

## 2013-08-23 MED ORDER — FLUOXETINE HCL 20 MG PO CAPS: 20.0000 mg | ORAL_CAPSULE | Freq: Every day | ORAL | Status: DC

## 2013-08-25 ENCOUNTER — Emergency Department: Payer: Self-pay | Admitting: Emergency Medicine

## 2013-08-25 DIAGNOSIS — S81009A Unspecified open wound, unspecified knee, initial encounter: Secondary | ICD-10-CM | POA: Diagnosis not present

## 2013-08-25 DIAGNOSIS — S91009A Unspecified open wound, unspecified ankle, initial encounter: Secondary | ICD-10-CM | POA: Diagnosis not present

## 2013-08-25 DIAGNOSIS — M25579 Pain in unspecified ankle and joints of unspecified foot: Secondary | ICD-10-CM | POA: Diagnosis not present

## 2013-08-25 DIAGNOSIS — S93409A Sprain of unspecified ligament of unspecified ankle, initial encounter: Secondary | ICD-10-CM | POA: Diagnosis not present

## 2013-08-25 DIAGNOSIS — S99919A Unspecified injury of unspecified ankle, initial encounter: Secondary | ICD-10-CM | POA: Diagnosis not present

## 2013-08-25 DIAGNOSIS — I10 Essential (primary) hypertension: Secondary | ICD-10-CM | POA: Diagnosis not present

## 2013-08-25 DIAGNOSIS — S8990XA Unspecified injury of unspecified lower leg, initial encounter: Secondary | ICD-10-CM | POA: Diagnosis not present

## 2013-08-26 ENCOUNTER — Encounter: Payer: Self-pay | Admitting: Pulmonary Disease

## 2013-08-27 ENCOUNTER — Telehealth: Payer: Self-pay

## 2013-08-27 DIAGNOSIS — R0602 Shortness of breath: Secondary | ICD-10-CM

## 2013-08-27 NOTE — Telephone Encounter (Signed)
Message copied by Velvet BatheAULFIELD, ASHLEY L on Mon Aug 27, 2013  3:20 PM ------      Message from: Max FickleMCQUAID, DOUGLAS B      Created: Thu Aug 23, 2013 10:55 PM       A,            Please let her know that her PFT's showed Asthma and a decreased ability to absorb oxygen.  I want her to have a cbc.            Thanks      B ------

## 2013-08-27 NOTE — Telephone Encounter (Signed)
Pt aware of results and recs.  Will be in on Wednesday for her cbc.  Nothing further needed.

## 2013-08-30 ENCOUNTER — Encounter: Payer: Self-pay | Admitting: Internal Medicine

## 2013-08-30 ENCOUNTER — Other Ambulatory Visit (INDEPENDENT_AMBULATORY_CARE_PROVIDER_SITE_OTHER): Payer: Medicare Other

## 2013-08-30 ENCOUNTER — Ambulatory Visit (INDEPENDENT_AMBULATORY_CARE_PROVIDER_SITE_OTHER): Payer: Medicare Other | Admitting: Internal Medicine

## 2013-08-30 VITALS — BP 108/68 | HR 97 | Temp 97.7°F | Wt 196.0 lb

## 2013-08-30 DIAGNOSIS — R609 Edema, unspecified: Secondary | ICD-10-CM

## 2013-08-30 DIAGNOSIS — I10 Essential (primary) hypertension: Secondary | ICD-10-CM | POA: Diagnosis not present

## 2013-08-30 DIAGNOSIS — R0602 Shortness of breath: Secondary | ICD-10-CM

## 2013-08-30 DIAGNOSIS — E119 Type 2 diabetes mellitus without complications: Secondary | ICD-10-CM | POA: Diagnosis not present

## 2013-08-30 DIAGNOSIS — M7989 Other specified soft tissue disorders: Secondary | ICD-10-CM | POA: Diagnosis not present

## 2013-08-30 DIAGNOSIS — M79609 Pain in unspecified limb: Secondary | ICD-10-CM

## 2013-08-30 DIAGNOSIS — R600 Localized edema: Secondary | ICD-10-CM

## 2013-08-30 DIAGNOSIS — M79672 Pain in left foot: Secondary | ICD-10-CM

## 2013-08-30 LAB — CBC WITH DIFFERENTIAL/PLATELET
BASOS ABS: 0 10*3/uL (ref 0.0–0.1)
BASOS PCT: 0.7 % (ref 0.0–3.0)
Basophils Absolute: 0.1 10*3/uL (ref 0.0–0.1)
Basophils Relative: 0.5 % (ref 0.0–3.0)
EOS ABS: 0.6 10*3/uL (ref 0.0–0.7)
EOS ABS: 0.6 10*3/uL (ref 0.0–0.7)
Eosinophils Relative: 6.2 % — ABNORMAL HIGH (ref 0.0–5.0)
Eosinophils Relative: 6.3 % — ABNORMAL HIGH (ref 0.0–5.0)
HCT: 34.6 % — ABNORMAL LOW (ref 36.0–46.0)
HCT: 34.8 % — ABNORMAL LOW (ref 36.0–46.0)
HEMOGLOBIN: 11.3 g/dL — AB (ref 12.0–15.0)
Hemoglobin: 11.4 g/dL — ABNORMAL LOW (ref 12.0–15.0)
LYMPHS ABS: 2.4 10*3/uL (ref 0.7–4.0)
LYMPHS PCT: 26.4 % (ref 12.0–46.0)
LYMPHS PCT: 27.1 % (ref 12.0–46.0)
Lymphs Abs: 2.4 10*3/uL (ref 0.7–4.0)
MCHC: 32.7 g/dL (ref 30.0–36.0)
MCHC: 32.8 g/dL (ref 30.0–36.0)
MCV: 97.1 fl (ref 78.0–100.0)
MCV: 97.5 fl (ref 78.0–100.0)
MONOS PCT: 8.6 % (ref 3.0–12.0)
Monocytes Absolute: 0.8 10*3/uL (ref 0.1–1.0)
Monocytes Absolute: 0.8 10*3/uL (ref 0.1–1.0)
Monocytes Relative: 9.1 % (ref 3.0–12.0)
NEUTROS ABS: 5.2 10*3/uL (ref 1.4–7.7)
Neutro Abs: 5.1 10*3/uL (ref 1.4–7.7)
Neutrophils Relative %: 57.5 % (ref 43.0–77.0)
Neutrophils Relative %: 57.6 % (ref 43.0–77.0)
Platelets: 272 10*3/uL (ref 150.0–400.0)
Platelets: 282 10*3/uL (ref 150.0–400.0)
RBC: 3.56 Mil/uL — ABNORMAL LOW (ref 3.87–5.11)
RBC: 3.57 Mil/uL — ABNORMAL LOW (ref 3.87–5.11)
RDW: 14.5 % (ref 11.5–14.6)
RDW: 14.8 % — AB (ref 11.5–14.6)
WBC: 8.9 10*3/uL (ref 4.5–10.5)
WBC: 9 10*3/uL (ref 4.5–10.5)

## 2013-08-30 LAB — HEPATIC FUNCTION PANEL
ALK PHOS: 51 U/L (ref 39–117)
ALT: 17 U/L (ref 0–35)
AST: 23 U/L (ref 0–37)
Albumin: 3.3 g/dL — ABNORMAL LOW (ref 3.5–5.2)
Bilirubin, Direct: 0.2 mg/dL (ref 0.0–0.3)
TOTAL PROTEIN: 6.1 g/dL (ref 6.0–8.3)
Total Bilirubin: 0.6 mg/dL (ref 0.3–1.2)

## 2013-08-30 LAB — BASIC METABOLIC PANEL
BUN: 22 mg/dL (ref 6–23)
CHLORIDE: 106 meq/L (ref 96–112)
CO2: 24 mEq/L (ref 19–32)
Calcium: 9.1 mg/dL (ref 8.4–10.5)
Creatinine, Ser: 1.4 mg/dL — ABNORMAL HIGH (ref 0.4–1.2)
GFR: 36.79 mL/min — ABNORMAL LOW (ref >60.00)
Glucose, Bld: 107 mg/dL — ABNORMAL HIGH (ref 70–99)
Potassium: 4.4 mEq/L (ref 3.5–5.1)
Sodium: 140 mEq/L (ref 135–145)

## 2013-08-30 LAB — LIPID PANEL
CHOLESTEROL: 122 mg/dL (ref 0–200)
HDL: 43.5 mg/dL (ref >39.00)
LDL Cholesterol: 67 mg/dL (ref 0–99)
Total CHOL/HDL Ratio: 3
Triglycerides: 60 mg/dL (ref 0.0–149.0)
VLDL: 12 mg/dL (ref 0.0–40.0)

## 2013-08-30 LAB — HEMOGLOBIN A1C: HEMOGLOBIN A1C: 6.5 % (ref 4.6–6.5)

## 2013-08-30 NOTE — Progress Notes (Signed)
Pre visit review using our clinic review tool, if applicable. No additional management support is needed unless otherwise documented below in the visit note. 

## 2013-08-31 ENCOUNTER — Telehealth: Payer: Self-pay

## 2013-08-31 NOTE — Telephone Encounter (Signed)
Pt aware of results and recs.  Nothing further needed. 

## 2013-09-02 ENCOUNTER — Encounter: Payer: Self-pay | Admitting: Internal Medicine

## 2013-09-02 ENCOUNTER — Telehealth: Payer: Self-pay | Admitting: Internal Medicine

## 2013-09-02 DIAGNOSIS — E538 Deficiency of other specified B group vitamins: Secondary | ICD-10-CM

## 2013-09-02 DIAGNOSIS — D649 Anemia, unspecified: Secondary | ICD-10-CM

## 2013-09-02 NOTE — Progress Notes (Signed)
Subjective:    [name], female    DOB: 1926-12-01, 80 y.o.   MRN: [id number]  Ankle Pain   80 year old female with past history of colonic polyps, vascular disease, hypertension, reoccurring urinary tract infections and hypercholesterolemia who comes in today as a work in with concerns regarding pain in her left lower leg and ankle s/p fall.   States she fell up the steps five days ago.  States her foot got caught on the stairs.  Her foot bent under her.  Went to ER.  Had xray.  Negative fracture.  Had increased swelling and pain.  Swelling has decreased some now.  Pain has improved some now.  Wanted reevaluated today.  Was here for labs so asked if I could take a look at her foot/ankle and left lower leg.       Past Medical History  Diagnosis Date  . Hypertension   . Hypercholesterolemia   . Peripheral vascular disease   . GERD (gastroesophageal reflux disease)     hiatal hernia  . History of frequent urinary tract infections   . Diabetes mellitus   . History of colon polyps   . Hematuria     followed by urology  . Gout   . Humeral fracture 9/09    comminuted impacted proximal    Current Outpatient Prescriptions on File Prior to Visit  Medication Sig Dispense Refill  . albuterol (PROVENTIL HFA;VENTOLIN HFA) 108 (90 BASE) MCG/ACT inhaler Inhale 2 puffs into the lungs every 6 (six) hours as needed for wheezing or shortness of breath.  1 Inhaler  1  . allopurinol (ZYLOPRIM) 100 MG tablet Take 1 tablet (100 mg total) by mouth daily.  30 tablet  5  . Calcium Carbonate-Vitamin D (CALCIUM 600+D) 600-400 MG-UNIT per tablet Take 1 tablet by mouth daily.       . cetirizine (ZYRTEC) 10 MG tablet Take 10 mg by mouth daily.      . Cholecalciferol (VITAMIN D-3) 1000 UNITS CAPS Take 1 capsule by mouth daily.      . Cranberry-Cholecalciferol 4200-500 MG-UNIT CAPS Take by mouth.      Marland Kitchen FLUoxetine (PROZAC) 20 MG capsule Take 1 capsule (20 mg total) by mouth daily.  30 capsule  2  .  Fluticasone-Salmeterol (ADVAIR) 100-50 MCG/DOSE AEPB Inhale 2 puffs into the lungs 2 (two) times daily.       Marland Kitchen levothyroxine (SYNTHROID) 50 MCG tablet Take 1 tablet (50 mcg total) by mouth daily.  30 tablet  10  . losartan (COZAAR) 100 MG tablet Take 100 mg by mouth daily.      . methenamine (HIPREX) 1 G tablet Take 1 g by mouth 2 (two) times daily with a meal.      . mirabegron ER (MYRBETRIQ) 25 MG TB24 tablet Take 1 tablet (25 mg total) by mouth daily.  30 tablet  1  . montelukast (SINGULAIR) 10 MG tablet Take 1 tablet (10 mg total) by mouth at bedtime.  30 tablet  5  . pantoprazole (PROTONIX) 40 MG tablet Take 1 tablet (40 mg total) by mouth daily.  30 tablet  5  . simvastatin (ZOCOR) 20 MG tablet Take 1 tablet (20 mg total) by mouth daily.  30 tablet  5   No current facility-administered medications on file prior to visit.    Review of Systems Patient denies any headache, lightheadedness or dizziness.  No syncope or near syncopal episodes.  She fell walking up the steps as outlined.  Foot bent under her.  Persistent pain and swelling, has improved.          Objective:   Physical Exam  Filed Vitals:   08/30/13 1133  BP: 108/68  Pulse: 97  Temp: 97.7 F (8836.955 C)   80 year old female in no acute distress. EXTREMITIES:  Increased swelling - left leg worse than right.    DP pulses palpable and equal bilaterally.  Some stasis changes.  Bruising noted over her left foot, left ankle and left lower leg.  No pain in the ankle with rotation of her left foot.  Increased pain to palpation over the metatarsal and left ankle.    FEET:  No lesions.        Assessment & Plan:  MSK.  S/p knee surgery.  Has been released.   CARDIOVASCULAR.  Stable.    HEALTH MAINTENANCE.  Physical 08/14/12.  Colonoscopy 9/02 revealed diverticulosis.  Saw GI and they felt no further w/up warranted.  Previous bone density revealed osteopenia.  Mammogram 12/20/12 - recommended f/u views.  Follow up views 12/26/12 -  Birads III.  Recommended f/u in 6 months.

## 2013-09-02 NOTE — Telephone Encounter (Signed)
Pt notified of lab results via my chart.  Needs a non fasting lab within one week.  Please schedule and contact her with a lab appt date and time.  Thanks.   Dr Lorin PicketScott

## 2013-09-02 NOTE — Assessment & Plan Note (Signed)
Foot pain, ankle pain and lower leg pain and swelling.  Bruising.  Swelling better.  Still with increased pain more localized to her foot.  Post op shoe.  Leg elevation with sitting.  Refer to podiatry for evaluation and further treatment.

## 2013-09-03 DIAGNOSIS — S96819A Strain of other specified muscles and tendons at ankle and foot level, unspecified foot, initial encounter: Secondary | ICD-10-CM | POA: Diagnosis not present

## 2013-09-03 DIAGNOSIS — S82899A Other fracture of unspecified lower leg, initial encounter for closed fracture: Secondary | ICD-10-CM | POA: Diagnosis not present

## 2013-09-03 DIAGNOSIS — S93499A Sprain of other ligament of unspecified ankle, initial encounter: Secondary | ICD-10-CM | POA: Diagnosis not present

## 2013-09-03 NOTE — Telephone Encounter (Signed)
Left message for pt to call office  Please schedule non fasting lab appointment

## 2013-09-04 NOTE — Telephone Encounter (Signed)
I would like for her to come in within one week for her labs.  Non fasting lab.  Labs already ordered. See last lab note.

## 2013-09-04 NOTE — Telephone Encounter (Signed)
Call pt.  Pt stated she went to dr Graciela Husbandsklein office yesterday and he put long boot on her foot.  She stated she still having problems walking on it yets.  She has b12 shot scheduled for 4/27.  We put her lab appointment at the same time.   Is it ok for her to wait that long for labs

## 2013-09-05 NOTE — Telephone Encounter (Signed)
Left message for pt to call office

## 2013-09-06 NOTE — Telephone Encounter (Signed)
Pt aware of appointmetn date and time °

## 2013-09-12 ENCOUNTER — Other Ambulatory Visit (INDEPENDENT_AMBULATORY_CARE_PROVIDER_SITE_OTHER): Payer: Medicare Other

## 2013-09-12 DIAGNOSIS — D649 Anemia, unspecified: Secondary | ICD-10-CM | POA: Diagnosis not present

## 2013-09-12 DIAGNOSIS — E538 Deficiency of other specified B group vitamins: Secondary | ICD-10-CM

## 2013-09-12 LAB — FERRITIN: Ferritin: 30.7 ng/mL (ref 10.0–291.0)

## 2013-09-12 LAB — CBC WITH DIFFERENTIAL/PLATELET
BASOS PCT: 0.3 % (ref 0.0–3.0)
Basophils Absolute: 0 10*3/uL (ref 0.0–0.1)
EOS PCT: 3.9 % (ref 0.0–5.0)
Eosinophils Absolute: 0.4 10*3/uL (ref 0.0–0.7)
HEMATOCRIT: 37.7 % (ref 36.0–46.0)
Hemoglobin: 12.2 g/dL (ref 12.0–15.0)
LYMPHS ABS: 2.8 10*3/uL (ref 0.7–4.0)
Lymphocytes Relative: 25.3 % (ref 12.0–46.0)
MCHC: 32.3 g/dL (ref 30.0–36.0)
MCV: 97.3 fl (ref 78.0–100.0)
MONO ABS: 0.8 10*3/uL (ref 0.1–1.0)
Monocytes Relative: 7.5 % (ref 3.0–12.0)
Neutro Abs: 6.9 10*3/uL (ref 1.4–7.7)
Neutrophils Relative %: 63 % (ref 43.0–77.0)
Platelets: 320 10*3/uL (ref 150.0–400.0)
RBC: 3.88 Mil/uL (ref 3.87–5.11)
RDW: 14.4 % (ref 11.5–14.6)
WBC: 11 10*3/uL — AB (ref 4.5–10.5)

## 2013-09-12 LAB — IBC PANEL
Iron: 48 ug/dL (ref 42–145)
SATURATION RATIOS: 14.6 % — AB (ref 20.0–50.0)
Transferrin: 234.6 mg/dL (ref 212.0–360.0)

## 2013-09-12 LAB — VITAMIN B12: Vitamin B-12: 708 pg/mL (ref 211–911)

## 2013-09-13 ENCOUNTER — Encounter: Payer: Self-pay | Admitting: Internal Medicine

## 2013-09-13 ENCOUNTER — Other Ambulatory Visit: Payer: Self-pay | Admitting: Internal Medicine

## 2013-09-13 ENCOUNTER — Encounter: Payer: Self-pay | Admitting: *Deleted

## 2013-09-13 DIAGNOSIS — D72829 Elevated white blood cell count, unspecified: Secondary | ICD-10-CM

## 2013-09-13 NOTE — Progress Notes (Signed)
Order placed for f/u cbc.   

## 2013-09-17 ENCOUNTER — Ambulatory Visit (INDEPENDENT_AMBULATORY_CARE_PROVIDER_SITE_OTHER): Payer: Medicare Other | Admitting: *Deleted

## 2013-09-17 ENCOUNTER — Other Ambulatory Visit: Payer: Medicare Other

## 2013-09-17 DIAGNOSIS — E538 Deficiency of other specified B group vitamins: Secondary | ICD-10-CM | POA: Diagnosis not present

## 2013-09-17 DIAGNOSIS — M7750 Other enthesopathy of unspecified foot: Secondary | ICD-10-CM | POA: Diagnosis not present

## 2013-09-17 DIAGNOSIS — S82899A Other fracture of unspecified lower leg, initial encounter for closed fracture: Secondary | ICD-10-CM | POA: Diagnosis not present

## 2013-09-17 MED ORDER — CYANOCOBALAMIN 1000 MCG/ML IJ SOLN
1000.0000 ug | Freq: Once | INTRAMUSCULAR | Status: AC
  Administered 2013-09-17: 1000 ug via INTRAMUSCULAR

## 2013-09-20 ENCOUNTER — Other Ambulatory Visit: Payer: Self-pay | Admitting: *Deleted

## 2013-09-20 MED ORDER — MONTELUKAST SODIUM 10 MG PO TABS: 10.0000 mg | ORAL_TABLET | Freq: Every day | ORAL | Status: DC

## 2013-09-26 ENCOUNTER — Other Ambulatory Visit (INDEPENDENT_AMBULATORY_CARE_PROVIDER_SITE_OTHER): Payer: Medicare Other

## 2013-09-26 DIAGNOSIS — D72829 Elevated white blood cell count, unspecified: Secondary | ICD-10-CM | POA: Diagnosis not present

## 2013-09-26 LAB — CBC WITH DIFFERENTIAL/PLATELET
Basophils Absolute: 0 10*3/uL (ref 0.0–0.1)
Basophils Relative: 0.4 % (ref 0.0–3.0)
Eosinophils Absolute: 0.4 10*3/uL (ref 0.0–0.7)
Eosinophils Relative: 4.9 % (ref 0.0–5.0)
HEMATOCRIT: 36.4 % (ref 36.0–46.0)
HEMOGLOBIN: 11.9 g/dL — AB (ref 12.0–15.0)
LYMPHS ABS: 2.2 10*3/uL (ref 0.7–4.0)
Lymphocytes Relative: 24.7 % (ref 12.0–46.0)
MCHC: 32.7 g/dL (ref 30.0–36.0)
MCV: 96.2 fl (ref 78.0–100.0)
MONOS PCT: 7.4 % (ref 3.0–12.0)
Monocytes Absolute: 0.6 10*3/uL (ref 0.1–1.0)
Neutro Abs: 5.5 10*3/uL (ref 1.4–7.7)
Neutrophils Relative %: 62.6 % (ref 43.0–77.0)
PLATELETS: 312 10*3/uL (ref 150.0–400.0)
RBC: 3.78 Mil/uL — ABNORMAL LOW (ref 3.87–5.11)
RDW: 14.3 % (ref 11.5–15.5)
WBC: 8.7 10*3/uL (ref 4.0–10.5)

## 2013-09-28 ENCOUNTER — Encounter: Payer: Self-pay | Admitting: Internal Medicine

## 2013-10-01 NOTE — Telephone Encounter (Signed)
Unread mychart message mailed to patient 

## 2013-10-04 ENCOUNTER — Other Ambulatory Visit: Payer: Self-pay | Admitting: *Deleted

## 2013-10-04 NOTE — Telephone Encounter (Signed)
I did not prescribe this to her.  She need to call MD that prescribed.

## 2013-10-04 NOTE — Telephone Encounter (Signed)
Refill

## 2013-10-16 DIAGNOSIS — M79609 Pain in unspecified limb: Secondary | ICD-10-CM | POA: Diagnosis not present

## 2013-10-16 DIAGNOSIS — S82899A Other fracture of unspecified lower leg, initial encounter for closed fracture: Secondary | ICD-10-CM | POA: Diagnosis not present

## 2013-10-18 ENCOUNTER — Telehealth: Payer: Self-pay | Admitting: *Deleted

## 2013-10-18 NOTE — Telephone Encounter (Signed)
What labs and dx?  

## 2013-10-19 ENCOUNTER — Other Ambulatory Visit: Payer: Medicare Other

## 2013-10-19 ENCOUNTER — Ambulatory Visit (INDEPENDENT_AMBULATORY_CARE_PROVIDER_SITE_OTHER): Payer: Medicare Other | Admitting: *Deleted

## 2013-10-19 ENCOUNTER — Ambulatory Visit: Payer: Medicare Other | Admitting: Internal Medicine

## 2013-10-19 DIAGNOSIS — E538 Deficiency of other specified B group vitamins: Secondary | ICD-10-CM | POA: Diagnosis not present

## 2013-10-19 MED ORDER — CYANOCOBALAMIN 1000 MCG/ML IJ SOLN
1000.0000 ug | Freq: Once | INTRAMUSCULAR | Status: AC
  Administered 2013-10-19: 1000 ug via INTRAMUSCULAR

## 2013-10-19 NOTE — Telephone Encounter (Signed)
I had sent her a message explaining that she did not need to come in for the 10/19/13 lab appt.  She was here and the lab was repeated on 09/29/13.  I don't think she will show up for the appt.  If she does please explain.  It was to follow up on a cbc.   Thanks.

## 2013-10-23 ENCOUNTER — Ambulatory Visit (INDEPENDENT_AMBULATORY_CARE_PROVIDER_SITE_OTHER): Payer: Medicare Other | Admitting: Internal Medicine

## 2013-10-23 ENCOUNTER — Encounter: Payer: Self-pay | Admitting: Internal Medicine

## 2013-10-23 ENCOUNTER — Other Ambulatory Visit: Payer: Self-pay | Admitting: *Deleted

## 2013-10-23 VITALS — BP 108/60 | HR 98 | Temp 99.1°F | Ht 65.0 in | Wt 190.5 lb

## 2013-10-23 DIAGNOSIS — E559 Vitamin D deficiency, unspecified: Secondary | ICD-10-CM

## 2013-10-23 DIAGNOSIS — E669 Obesity, unspecified: Secondary | ICD-10-CM

## 2013-10-23 DIAGNOSIS — E7800 Pure hypercholesterolemia, unspecified: Secondary | ICD-10-CM

## 2013-10-23 DIAGNOSIS — I10 Essential (primary) hypertension: Secondary | ICD-10-CM | POA: Diagnosis not present

## 2013-10-23 DIAGNOSIS — R05 Cough: Secondary | ICD-10-CM

## 2013-10-23 DIAGNOSIS — E119 Type 2 diabetes mellitus without complications: Secondary | ICD-10-CM | POA: Diagnosis not present

## 2013-10-23 DIAGNOSIS — I739 Peripheral vascular disease, unspecified: Secondary | ICD-10-CM

## 2013-10-23 DIAGNOSIS — M899 Disorder of bone, unspecified: Secondary | ICD-10-CM

## 2013-10-23 DIAGNOSIS — K219 Gastro-esophageal reflux disease without esophagitis: Secondary | ICD-10-CM

## 2013-10-23 DIAGNOSIS — R059 Cough, unspecified: Secondary | ICD-10-CM

## 2013-10-23 DIAGNOSIS — D649 Anemia, unspecified: Secondary | ICD-10-CM

## 2013-10-23 DIAGNOSIS — M949 Disorder of cartilage, unspecified: Secondary | ICD-10-CM

## 2013-10-23 DIAGNOSIS — M109 Gout, unspecified: Secondary | ICD-10-CM

## 2013-10-23 DIAGNOSIS — R928 Other abnormal and inconclusive findings on diagnostic imaging of breast: Secondary | ICD-10-CM

## 2013-10-23 DIAGNOSIS — Z87440 Personal history of urinary (tract) infections: Secondary | ICD-10-CM

## 2013-10-23 DIAGNOSIS — M8580 Other specified disorders of bone density and structure, unspecified site: Secondary | ICD-10-CM

## 2013-10-23 DIAGNOSIS — M79672 Pain in left foot: Secondary | ICD-10-CM

## 2013-10-23 DIAGNOSIS — M79609 Pain in unspecified limb: Secondary | ICD-10-CM

## 2013-10-23 DIAGNOSIS — E538 Deficiency of other specified B group vitamins: Secondary | ICD-10-CM

## 2013-10-23 MED ORDER — LOSARTAN POTASSIUM 100 MG PO TABS: 100.0000 mg | ORAL_TABLET | Freq: Every day | ORAL | Status: DC

## 2013-10-23 MED ORDER — FLUTICASONE-SALMETEROL 500-50 MCG/DOSE IN AEPB: 1.0000 | INHALATION_SPRAY | Freq: Two times a day (BID) | RESPIRATORY_TRACT | Status: DC

## 2013-10-23 NOTE — Progress Notes (Signed)
Pre visit review using our clinic review tool, if applicable. No additional management support is needed unless otherwise documented below in the visit note. 

## 2013-10-27 ENCOUNTER — Inpatient Hospital Stay: Payer: Self-pay | Admitting: Family Medicine

## 2013-10-27 ENCOUNTER — Encounter: Payer: Self-pay | Admitting: Internal Medicine

## 2013-10-27 DIAGNOSIS — R32 Unspecified urinary incontinence: Secondary | ICD-10-CM | POA: Diagnosis present

## 2013-10-27 DIAGNOSIS — I739 Peripheral vascular disease, unspecified: Secondary | ICD-10-CM | POA: Diagnosis present

## 2013-10-27 DIAGNOSIS — N390 Urinary tract infection, site not specified: Secondary | ICD-10-CM | POA: Diagnosis not present

## 2013-10-27 DIAGNOSIS — N179 Acute kidney failure, unspecified: Secondary | ICD-10-CM | POA: Diagnosis not present

## 2013-10-27 DIAGNOSIS — R6520 Severe sepsis without septic shock: Secondary | ICD-10-CM | POA: Diagnosis present

## 2013-10-27 DIAGNOSIS — R05 Cough: Secondary | ICD-10-CM | POA: Diagnosis not present

## 2013-10-27 DIAGNOSIS — E785 Hyperlipidemia, unspecified: Secondary | ICD-10-CM | POA: Diagnosis present

## 2013-10-27 DIAGNOSIS — K219 Gastro-esophageal reflux disease without esophagitis: Secondary | ICD-10-CM | POA: Diagnosis present

## 2013-10-27 DIAGNOSIS — I10 Essential (primary) hypertension: Secondary | ICD-10-CM | POA: Diagnosis not present

## 2013-10-27 DIAGNOSIS — I129 Hypertensive chronic kidney disease with stage 1 through stage 4 chronic kidney disease, or unspecified chronic kidney disease: Secondary | ICD-10-CM | POA: Diagnosis present

## 2013-10-27 DIAGNOSIS — J189 Pneumonia, unspecified organism: Secondary | ICD-10-CM | POA: Diagnosis present

## 2013-10-27 DIAGNOSIS — Z885 Allergy status to narcotic agent status: Secondary | ICD-10-CM | POA: Diagnosis not present

## 2013-10-27 DIAGNOSIS — Z882 Allergy status to sulfonamides status: Secondary | ICD-10-CM | POA: Diagnosis not present

## 2013-10-27 DIAGNOSIS — R0602 Shortness of breath: Secondary | ICD-10-CM | POA: Diagnosis not present

## 2013-10-27 DIAGNOSIS — J90 Pleural effusion, not elsewhere classified: Secondary | ICD-10-CM | POA: Diagnosis not present

## 2013-10-27 DIAGNOSIS — J441 Chronic obstructive pulmonary disease with (acute) exacerbation: Secondary | ICD-10-CM | POA: Diagnosis present

## 2013-10-27 DIAGNOSIS — R059 Cough, unspecified: Secondary | ICD-10-CM | POA: Diagnosis not present

## 2013-10-27 DIAGNOSIS — Z881 Allergy status to other antibiotic agents status: Secondary | ICD-10-CM | POA: Diagnosis not present

## 2013-10-27 DIAGNOSIS — E039 Hypothyroidism, unspecified: Secondary | ICD-10-CM | POA: Diagnosis not present

## 2013-10-27 DIAGNOSIS — A419 Sepsis, unspecified organism: Secondary | ICD-10-CM | POA: Diagnosis not present

## 2013-10-27 DIAGNOSIS — N189 Chronic kidney disease, unspecified: Secondary | ICD-10-CM | POA: Diagnosis present

## 2013-10-27 LAB — CBC
HCT: 36.4 % (ref 35.0–47.0)
HGB: 11.5 g/dL — ABNORMAL LOW (ref 12.0–16.0)
MCH: 29.8 pg (ref 26.0–34.0)
MCHC: 31.5 g/dL — AB (ref 32.0–36.0)
MCV: 95 fL (ref 80–100)
Platelet: 376 10*3/uL (ref 150–440)
RBC: 3.84 10*6/uL (ref 3.80–5.20)
RDW: 13.6 % (ref 11.5–14.5)
WBC: 15.3 10*3/uL — ABNORMAL HIGH (ref 3.6–11.0)

## 2013-10-27 LAB — URINALYSIS, COMPLETE
BILIRUBIN, UR: NEGATIVE
BLOOD: NEGATIVE
GLUCOSE, UR: NEGATIVE mg/dL (ref 0–75)
KETONE: NEGATIVE
Nitrite: NEGATIVE
Ph: 5 (ref 4.5–8.0)
Protein: NEGATIVE
Specific Gravity: 1.011 (ref 1.003–1.030)

## 2013-10-27 LAB — COMPREHENSIVE METABOLIC PANEL
AST: 30 U/L (ref 15–37)
Albumin: 2.4 g/dL — ABNORMAL LOW (ref 3.4–5.0)
Alkaline Phosphatase: 104 U/L
Anion Gap: 9 (ref 7–16)
BILIRUBIN TOTAL: 0.3 mg/dL (ref 0.2–1.0)
BUN: 23 mg/dL — AB (ref 7–18)
CHLORIDE: 104 mmol/L (ref 98–107)
CREATININE: 1.47 mg/dL — AB (ref 0.60–1.30)
Calcium, Total: 8.8 mg/dL (ref 8.5–10.1)
Co2: 25 mmol/L (ref 21–32)
GFR CALC AF AMER: 38 — AB
GFR CALC NON AF AMER: 32 — AB
GLUCOSE: 159 mg/dL — AB (ref 65–99)
OSMOLALITY: 283 (ref 275–301)
Potassium: 4.2 mmol/L (ref 3.5–5.1)
SGPT (ALT): 19 U/L (ref 12–78)
SODIUM: 138 mmol/L (ref 136–145)
Total Protein: 6.8 g/dL (ref 6.4–8.2)

## 2013-10-27 LAB — TROPONIN I

## 2013-10-28 ENCOUNTER — Encounter: Payer: Self-pay | Admitting: Internal Medicine

## 2013-10-28 DIAGNOSIS — E669 Obesity, unspecified: Secondary | ICD-10-CM | POA: Insufficient documentation

## 2013-10-28 LAB — CBC WITH DIFFERENTIAL/PLATELET
BASOS ABS: 0 10*3/uL (ref 0.0–0.1)
Basophil %: 0.1 %
EOS ABS: 0 10*3/uL (ref 0.0–0.7)
EOS PCT: 0 %
HCT: 35.5 % (ref 35.0–47.0)
HGB: 11 g/dL — ABNORMAL LOW (ref 12.0–16.0)
LYMPHS ABS: 1 10*3/uL (ref 1.0–3.6)
Lymphocyte %: 9.2 %
MCH: 29.6 pg (ref 26.0–34.0)
MCHC: 31 g/dL — AB (ref 32.0–36.0)
MCV: 96 fL (ref 80–100)
MONO ABS: 0.3 x10 3/mm (ref 0.2–0.9)
MONOS PCT: 3.1 %
Neutrophil #: 9.7 10*3/uL — ABNORMAL HIGH (ref 1.4–6.5)
Neutrophil %: 87.6 %
Platelet: 352 10*3/uL (ref 150–440)
RBC: 3.72 10*6/uL — ABNORMAL LOW (ref 3.80–5.20)
RDW: 13.7 % (ref 11.5–14.5)
WBC: 11.1 10*3/uL — AB (ref 3.6–11.0)

## 2013-10-28 LAB — BASIC METABOLIC PANEL
ANION GAP: 7 (ref 7–16)
BUN: 18 mg/dL (ref 7–18)
CALCIUM: 8.5 mg/dL (ref 8.5–10.1)
Chloride: 108 mmol/L — ABNORMAL HIGH (ref 98–107)
Co2: 25 mmol/L (ref 21–32)
Creatinine: 1.2 mg/dL (ref 0.60–1.30)
EGFR (Non-African Amer.): 41 — ABNORMAL LOW
GFR CALC AF AMER: 48 — AB
Glucose: 205 mg/dL — ABNORMAL HIGH (ref 65–99)
OSMOLALITY: 287 (ref 275–301)
Potassium: 4.2 mmol/L (ref 3.5–5.1)
SODIUM: 140 mmol/L (ref 136–145)

## 2013-10-28 NOTE — Progress Notes (Signed)
Subjective:    [name], female    DOB: 06-06-1926, 80 y.o.   MRN: [id number]  HPI 80 year old female with past history of colonic polyps, vascular disease, hypertension, reoccurring urinary tract infections and hypercholesterolemia who comes in today to follow up on these issues as well as for a complete physical exam.  was previously seeing Dr Raynald Kemp and Dr Lavella Hammock for her bladder issues.  Was started on Myrbetriq.  Also took hipprex.  Bladder symptoms have improved.  No recent infections.  No longer following with urology.  No cardiac symptoms with increased activity or exertion.  Bowels stable.  No abdominal pain or cramping.  Does report increased cough and chest congestion.  Persistent intermittent flares.  Was evaluated by Dr Kendrick Fries.  Advair increased to 500/50.  Felt improved on this regimen.  Since being back on 250 Advair, she reports some increased mucus and increased congestion and cough.  Flares at times.  Ankle/foot doing better.  Has been released.  Does report some fatigue.  She is accompanied by her daughter.  History obtained from both of them.       Past Medical History  Diagnosis Date  . Hypertension   . Hypercholesterolemia   . Peripheral vascular disease   . GERD (gastroesophageal reflux disease)     hiatal hernia  . History of frequent urinary tract infections   . Diabetes mellitus   . History of colon polyps   . Hematuria     followed by urology  . Gout   . Humeral fracture 9/09    comminuted impacted proximal    Current Outpatient Prescriptions on File Prior to Visit  Medication Sig Dispense Refill  . albuterol (PROVENTIL HFA;VENTOLIN HFA) 108 (90 BASE) MCG/ACT inhaler Inhale 2 puffs into the lungs every 6 (six) hours as needed for wheezing or shortness of breath.  1 Inhaler  1  . allopurinol (ZYLOPRIM) 100 MG tablet Take 1 tablet (100 mg total) by mouth daily.  30 tablet  5  . Calcium Carbonate-Vitamin D (CALCIUM 600+D) 600-400 MG-UNIT  per tablet Take 1 tablet by mouth daily.       . cetirizine (ZYRTEC) 10 MG tablet Take 10 mg by mouth daily.      . Cholecalciferol (VITAMIN D-3) 1000 UNITS CAPS Take 1 capsule by mouth daily.      . Cranberry-Cholecalciferol 4200-500 MG-UNIT CAPS Take by mouth.      Marland Kitchen FLUoxetine (PROZAC) 20 MG capsule Take 1 capsule (20 mg total) by mouth daily.  30 capsule  2  . levothyroxine (SYNTHROID) 50 MCG tablet Take 1 tablet (50 mcg total) by mouth daily.  30 tablet  10  . methenamine (HIPREX) 1 G tablet Take 1 g by mouth 2 (two) times daily with a meal.      . mirabegron ER (MYRBETRIQ) 25 MG TB24 tablet Take 1 tablet (25 mg total) by mouth daily.  30 tablet  1  . montelukast (SINGULAIR) 10 MG tablet Take 1 tablet (10 mg total) by mouth at bedtime.  30 tablet  5  . pantoprazole (PROTONIX) 40 MG tablet Take 1 tablet (40 mg total) by mouth daily.  30 tablet  5  . simvastatin (ZOCOR) 20 MG tablet Take 1 tablet (20 mg total) by mouth daily.  30 tablet  5   No current facility-administered medications on file prior to visit.    Review of Systems Patient denies any headache, lightheadedness or dizziness.  Increased chest congestion and cough.  Persistent.  Worsening.   Using her inhalers regularly.  No chest pain, tightness or palpitations.  No nausea or vomiting.   No abdominal pain or cramping.  No acid reflux reported.  No bowel change, such as diarrhea, constipation, BRBPR or melana.  Urinary symptoms have improved.  Was seeing Dr Casilda CarlsWindeish and Dr Lavella Hammockatherine Matthews for her bladder.  Is no longer following with urology.          Objective:   Physical Exam  Filed Vitals:   10/23/13 1557  BP: 108/60  Pulse: 98  Temp: 99.1 F (37.3 C)   Blood pressure recheck:  50110/6572  80 year old female in no acute distress.   HEENT:  Nares- clear.  Oropharynx - without lesions. NECK:  Supple.  Nontender.  No audible bruit.  HEART:  Appears to be regular. LUNGS:  No crackles or wheezing audible.  Respirations  even and unlabored.  RADIAL PULSE:  Equal bilaterally.    BREASTS:  No nipple discharge or nipple retraction present.  Could not appreciate any distinct nodules or axillary adenopathy.  ABDOMEN:  Soft, nontender.  Bowel sounds present and normal.  No audible abdominal bruit.  GU:  Not performed.     EXTREMITIES:  Increased minimal increased swelling - left worse than right.    DP pulses palpable and equal bilaterally.  Some stasis changes.  No infection.   FEET:  No lesions.        Assessment & Plan:  MSK.  S/p knee surgery.  Has been released.   CARDIOVASCULAR.  Stable.    HEALTH MAINTENANCE.  Physical today.  Colonoscopy 9/02 revealed diverticulosis.  Saw GI and they felt no further w/up warranted.  Previous bone density revealed osteopenia.  Mammogram 12/20/12 - recommended f/u views.  Follow up views 12/26/12 - Birads III.  Recommended f/u in 6 months.  She did not go for f/u mammogram.  See if we can go ahead and schedule bilateral mammogram.    I spent 40 minutes with the patient and more than 50% of the time was spent in consultation regarding the above.

## 2013-10-28 NOTE — Assessment & Plan Note (Signed)
Has not required abx recently.  No longer following with urology.

## 2013-10-28 NOTE — Assessment & Plan Note (Signed)
Continue simvastatin.  Low cholesterol diet.  Follow lipid panel and liver function.   

## 2013-10-28 NOTE — Assessment & Plan Note (Signed)
Asymptomatic.  Varying pressures in each arm.  Stable.   

## 2013-10-28 NOTE — Assessment & Plan Note (Signed)
Discussed diet and exercise.  Follow.  

## 2013-10-28 NOTE — Assessment & Plan Note (Signed)
On losartan 100mg  q day.  Pressures low.  Some fatigue.  Decrease back down to 50mg  losartan q day.  Follow pressures.  Follow metabolic panel.  Stay hydrated.

## 2013-10-28 NOTE — Assessment & Plan Note (Signed)
S/p fracture.  Saw Dr Alberteen Spindle.  Out of her boot and wrap.  Has been released.  Follow.

## 2013-10-28 NOTE — Assessment & Plan Note (Signed)
Persistent intermittent cough and congestion.  Saw Dr Kendrick Fries.  Refer to his note for details.  She reports "good and bad days".  Increased advair back up to 500/50.   Mucinex DM/robitussin as directed.  Hold on oral prednisone.  Follow.  Schedule f/u with Dr Kendrick Fries.

## 2013-10-28 NOTE — Assessment & Plan Note (Signed)
Low carb diet and exercise.  Follow met b and a1c.   Keep up to date with eye checks.

## 2013-10-28 NOTE — Assessment & Plan Note (Signed)
Saw Dr Alberteen Spindle.  On Allopurinol.

## 2013-10-28 NOTE — Assessment & Plan Note (Signed)
Continue vitamin D supplementation 

## 2013-10-28 NOTE — Assessment & Plan Note (Signed)
Continue B12 injections.   

## 2013-10-28 NOTE — Assessment & Plan Note (Signed)
On replacement.  Follow.  

## 2013-10-28 NOTE — Assessment & Plan Note (Signed)
Symptoms controlled.  Follow.   

## 2013-10-29 LAB — URINE CULTURE

## 2013-11-01 ENCOUNTER — Telehealth: Payer: Self-pay | Admitting: Internal Medicine

## 2013-11-01 LAB — CREATININE, SERUM
CREATININE: 1.23 mg/dL (ref 0.60–1.30)
EGFR (African American): 47 — ABNORMAL LOW
EGFR (Non-African Amer.): 40 — ABNORMAL LOW

## 2013-11-01 LAB — CULTURE, BLOOD (SINGLE)

## 2013-11-01 NOTE — Telephone Encounter (Signed)
Records requested

## 2013-11-01 NOTE — Telephone Encounter (Signed)
[facility] called to schedule a hospital follow up. Dr. Lorin Picket doesn't have a available appt. Patient was scheduled for a preivous appt on 8/17 at 4pm. Please advise/msn

## 2013-11-01 NOTE — Telephone Encounter (Signed)
ER records placed in your folder for review

## 2013-11-01 NOTE — Telephone Encounter (Signed)
Please put her in on 11/14/13 at 8:30 (block 30 minutes).  Also block the 8:15 slot.  Thanks.  Please call and document how pt doing.

## 2013-11-02 NOTE — Telephone Encounter (Signed)
Pt notified of appt & states that she is doing better & she is sleeping pretty good now.

## 2013-11-06 ENCOUNTER — Telehealth: Payer: Self-pay | Admitting: *Deleted

## 2013-11-06 ENCOUNTER — Other Ambulatory Visit: Payer: Self-pay | Admitting: Internal Medicine

## 2013-11-06 ENCOUNTER — Encounter: Payer: Self-pay | Admitting: Internal Medicine

## 2013-11-06 ENCOUNTER — Other Ambulatory Visit (INDEPENDENT_AMBULATORY_CARE_PROVIDER_SITE_OTHER): Payer: Medicare Other

## 2013-11-06 DIAGNOSIS — E119 Type 2 diabetes mellitus without complications: Secondary | ICD-10-CM | POA: Diagnosis not present

## 2013-11-06 DIAGNOSIS — I10 Essential (primary) hypertension: Secondary | ICD-10-CM | POA: Diagnosis not present

## 2013-11-06 DIAGNOSIS — D72829 Elevated white blood cell count, unspecified: Secondary | ICD-10-CM

## 2013-11-06 DIAGNOSIS — E7800 Pure hypercholesterolemia, unspecified: Secondary | ICD-10-CM | POA: Diagnosis not present

## 2013-11-06 DIAGNOSIS — R7989 Other specified abnormal findings of blood chemistry: Secondary | ICD-10-CM

## 2013-11-06 DIAGNOSIS — D649 Anemia, unspecified: Secondary | ICD-10-CM | POA: Diagnosis not present

## 2013-11-06 DIAGNOSIS — R945 Abnormal results of liver function studies: Secondary | ICD-10-CM

## 2013-11-06 LAB — LIPID PANEL
CHOL/HDL RATIO: 2
Cholesterol: 110 mg/dL (ref 0–200)
HDL: 47.6 mg/dL (ref >39.00)
LDL CALC: 42 mg/dL (ref 0–99)
NONHDL: 62.4
Triglycerides: 102 mg/dL (ref 0.0–149.0)
VLDL: 20.4 mg/dL (ref 0.0–40.0)

## 2013-11-06 LAB — MICROALBUMIN / CREATININE URINE RATIO
Creatinine,U: 212.7 mg/dL
MICROALB/CREAT RATIO: 1 mg/g (ref 0.0–30.0)
Microalb, Ur: 2.1 mg/dL — ABNORMAL HIGH (ref 0.0–1.9)

## 2013-11-06 LAB — CBC WITH DIFFERENTIAL/PLATELET
BASOS PCT: 0.3 % (ref 0.0–3.0)
Basophils Absolute: 0.1 10*3/uL (ref 0.0–0.1)
EOS PCT: 1.8 % (ref 0.0–5.0)
Eosinophils Absolute: 0.4 10*3/uL (ref 0.0–0.7)
HEMATOCRIT: 38.8 % (ref 36.0–46.0)
Hemoglobin: 12.4 g/dL (ref 12.0–15.0)
LYMPHS ABS: 3.9 10*3/uL (ref 0.7–4.0)
Lymphocytes Relative: 18.8 % (ref 12.0–46.0)
MCHC: 32.1 g/dL (ref 30.0–36.0)
MCV: 93.5 fl (ref 78.0–100.0)
MONO ABS: 1 10*3/uL (ref 0.1–1.0)
Monocytes Relative: 5 % (ref 3.0–12.0)
NEUTROS ABS: 15.5 10*3/uL — AB (ref 1.4–7.7)
Neutrophils Relative %: 74.1 % (ref 43.0–77.0)
PLATELETS: 457 10*3/uL — AB (ref 150.0–400.0)
RBC: 4.15 Mil/uL (ref 3.87–5.11)
RDW: 14.8 % (ref 11.5–15.5)
WBC: 20.9 10*3/uL (ref 4.0–10.5)

## 2013-11-06 LAB — HEPATIC FUNCTION PANEL
ALBUMIN: 3.3 g/dL — AB (ref 3.5–5.2)
ALT: 52 U/L — ABNORMAL HIGH (ref 0–35)
AST: 39 U/L — ABNORMAL HIGH (ref 0–37)
Alkaline Phosphatase: 65 U/L (ref 39–117)
Bilirubin, Direct: 0.1 mg/dL (ref 0.0–0.3)
Total Bilirubin: 0.5 mg/dL (ref 0.2–1.2)
Total Protein: 6.3 g/dL (ref 6.0–8.3)

## 2013-11-06 LAB — BASIC METABOLIC PANEL
BUN: 26 mg/dL — AB (ref 6–23)
CO2: 26 meq/L (ref 19–32)
Calcium: 9.5 mg/dL (ref 8.4–10.5)
Chloride: 100 mEq/L (ref 96–112)
Creatinine, Ser: 1.4 mg/dL — ABNORMAL HIGH (ref 0.4–1.2)
GFR: 39.61 mL/min — AB (ref >60.00)
GLUCOSE: 141 mg/dL — AB (ref 70–99)
POTASSIUM: 4.4 meq/L (ref 3.5–5.1)
Sodium: 136 mEq/L (ref 135–145)

## 2013-11-06 LAB — FERRITIN: FERRITIN: 64.4 ng/mL (ref 10.0–291.0)

## 2013-11-06 LAB — TSH: TSH: 3.42 u[IU]/mL (ref 0.35–4.50)

## 2013-11-06 LAB — HEMOGLOBIN A1C: Hgb A1c MFr Bld: 7.4 % — ABNORMAL HIGH (ref 4.6–6.5)

## 2013-11-06 NOTE — Telephone Encounter (Signed)
Pt notified. Lab appt scheduled 11/08/13.

## 2013-11-06 NOTE — Telephone Encounter (Signed)
Discussed with Becky.  Pt has recently been in the hospital and on prednisone.  Please call pt and see clinically how she is doing and if she is on high dose prednisone.  If clinically improved and continuing to do better, will need her to call us with updates.  Recheck cbc next week.  If any worsening symptoms or no improvement, will need to be reevaluated - to ER or (at very least acute care).  Will need labs from [facility] to compare labs.  Let me know if any questions or problems.

## 2013-11-06 NOTE — Telephone Encounter (Signed)
Since she is feeling good and doing better, will follow.  Notify her to follow closely and needs evaluated if any symptom change or problem.  Go ahead and schedule a f/u cbc this week - just to confirm not continuing to increase.  (may not be normal, but want to make sure not increasing).  Please schedule within the next couple of days.  Thanks.

## 2013-11-06 NOTE — Telephone Encounter (Signed)
Diamond Newman from the lab called to report a critical WBC

## 2013-11-06 NOTE — Progress Notes (Signed)
Order placed for f/u lab.   

## 2013-11-06 NOTE — Telephone Encounter (Signed)
Hospital notes say "Her WBC dropped from 15.3 to 11.1 on June 7th and then was not rechecked due to good clinical improvement."

## 2013-11-06 NOTE — Telephone Encounter (Signed)
Pt is not hospitalized.  Pt states after she had labs drawn she went home where she is now.  Please advise

## 2013-11-06 NOTE — Telephone Encounter (Signed)
Sent for hospital notes

## 2013-11-06 NOTE — Telephone Encounter (Signed)
Thanks.  I placed the order for the f/u lab.  When hospital records are received, please note last white count on record while in the hospital.  Thanks.  Call if problems.

## 2013-11-06 NOTE — Telephone Encounter (Signed)
Clydie BraunKaren from the lab called to report a critical WBC of 20,900.  Please advise in Dr Roby LoftsScott's absence.

## 2013-11-06 NOTE — Telephone Encounter (Addendum)
Spoke with pt, states she feels "good", has more energy now than before she went into the hospital. Has a couple more days of antibiotic left and finishes prednisone tomorrow, unsure of dose. Repeat lab scheduled for 11/13/13. Denies fever, cough, shortness of breath. No acute symptoms.

## 2013-11-06 NOTE — Progress Notes (Signed)
Order placed for f/u cbc.   

## 2013-11-06 NOTE — Telephone Encounter (Signed)
Reviewed recent telephone and MyChart messages in chart. Appears that pt has been admitted and on oral prednisone. Can you please call pt and confirm this? Can we request records from hospitalization so that I can compare recent labs?

## 2013-11-08 ENCOUNTER — Ambulatory Visit: Payer: Self-pay | Admitting: Internal Medicine

## 2013-11-08 ENCOUNTER — Ambulatory Visit (INDEPENDENT_AMBULATORY_CARE_PROVIDER_SITE_OTHER): Payer: Medicare Other | Admitting: Internal Medicine

## 2013-11-08 ENCOUNTER — Other Ambulatory Visit (INDEPENDENT_AMBULATORY_CARE_PROVIDER_SITE_OTHER): Payer: Medicare Other

## 2013-11-08 ENCOUNTER — Encounter: Payer: Self-pay | Admitting: Internal Medicine

## 2013-11-08 VITALS — BP 100/66 | HR 86 | Resp 16

## 2013-11-08 DIAGNOSIS — R7989 Other specified abnormal findings of blood chemistry: Secondary | ICD-10-CM | POA: Diagnosis not present

## 2013-11-08 DIAGNOSIS — R945 Abnormal results of liver function studies: Secondary | ICD-10-CM

## 2013-11-08 DIAGNOSIS — R51 Headache: Secondary | ICD-10-CM | POA: Diagnosis not present

## 2013-11-08 DIAGNOSIS — D72829 Elevated white blood cell count, unspecified: Secondary | ICD-10-CM

## 2013-11-08 DIAGNOSIS — S0990XA Unspecified injury of head, initial encounter: Secondary | ICD-10-CM

## 2013-11-08 NOTE — Assessment & Plan Note (Addendum)
Head injury after falling from standing position onto floor. Right forehead contusion. Increased risk SDH given age. Stat head CT performed showing no acute intracranial process. Will continue supportive care with ice prn. Falls prevention discussed with pt.

## 2013-11-08 NOTE — Progress Notes (Signed)
   Subjective:    [name], female    DOB: 06/13/1926, 80 y.o.   MRN: [id number]  HPI 80YO female presents as a walk-in acute visit. She reports she was standing in her bathroom this morning when she fell forward, hitting her head on the floor and a rug. She did not lose consciousness. Afterward, she has had right sided headache at site of injury. No nausea, vomiting. No other symptoms. She has developed a bruise at the site.  Review of Systems  Constitutional: Negative for fever, chills, appetite change, fatigue and unexpected weight change.  HENT: Positive for facial swelling. Negative for ear discharge and ear pain.   Eyes: Negative for photophobia, pain and visual disturbance.  Respiratory: Negative for cough, shortness of breath, wheezing and stridor.   Cardiovascular: Negative for chest pain, palpitations and leg swelling.  Gastrointestinal: Negative for nausea and vomiting.  Genitourinary: Negative for dysuria and flank pain.  Musculoskeletal: Negative for neck pain and neck stiffness.  Skin: Positive for color change.  Neurological: Positive for headaches. Negative for dizziness and weakness.       Objective:    BP 100/66  Pulse 86  Resp 16  SpO2 95% Physical Exam  Constitutional: She is oriented to person, place, and time. She appears well-developed and well-nourished. No distress.  HENT:  Head: Normocephalic. Head is with contusion.    Right Ear: External ear normal.  Left Ear: External ear normal.  Nose: Nose normal.  Mouth/Throat: Oropharynx is clear and moist.  Eyes: Conjunctivae are normal. Pupils are equal, round, and reactive to light. Right eye exhibits no discharge. Left eye exhibits no discharge. No scleral icterus.  Neck: Normal range of motion. Neck supple. No tracheal deviation present. No thyromegaly present.  Pulmonary/Chest: Effort normal. No accessory muscle usage. Not tachypneic. She has no decreased breath sounds. She has no rhonchi.    Musculoskeletal: Normal range of motion. She exhibits no edema and no tenderness.  Lymphadenopathy:    She has no cervical adenopathy.  Neurological: She is alert and oriented to person, place, and time. No cranial nerve deficit. She exhibits normal muscle tone. Coordination normal.  Skin: Skin is warm and dry. No rash noted. She is not diaphoretic. No erythema. No pallor.  Psychiatric: She has a normal mood and affect. Her behavior is normal. Judgment and thought content normal.          Assessment & Plan:   Problem List Items Addressed This Visit     Unprioritized   Head injury, unspecified - Primary     Head injury after falling from standing position onto floor. Right forehead contusion. Increased risk SDH given age. Stat head CT performed showing no acute intracranial process. Will continue supportive care with ice prn. Falls prevention discussed with pt.        No Follow-up on file.

## 2013-11-08 NOTE — Progress Notes (Signed)
Pre visit review using our clinic review tool, if applicable. No additional management support is needed unless otherwise documented below in the visit note. 

## 2013-11-09 ENCOUNTER — Telehealth: Payer: Self-pay | Admitting: *Deleted

## 2013-11-09 LAB — HEPATIC FUNCTION PANEL
ALBUMIN: 3 g/dL — AB (ref 3.5–5.2)
ALK PHOS: 62 U/L (ref 39–117)
ALT: 32 U/L (ref 0–35)
AST: 29 U/L (ref 0–37)
Bilirubin, Direct: 0 mg/dL (ref 0.0–0.3)
TOTAL PROTEIN: 5.8 g/dL — AB (ref 6.0–8.3)
Total Bilirubin: 0.6 mg/dL (ref 0.2–1.2)

## 2013-11-09 LAB — CBC WITH DIFFERENTIAL/PLATELET
BASOS ABS: 0.2 10*3/uL — AB (ref 0.0–0.1)
BASOS PCT: 0.7 % (ref 0.0–3.0)
Eosinophils Absolute: 0.5 10*3/uL (ref 0.0–0.7)
Eosinophils Relative: 1.8 % (ref 0.0–5.0)
HEMATOCRIT: 37.5 % (ref 36.0–46.0)
HEMOGLOBIN: 12.1 g/dL (ref 12.0–15.0)
LYMPHS ABS: 3.3 10*3/uL (ref 0.7–4.0)
Lymphocytes Relative: 12.8 % (ref 12.0–46.0)
MCHC: 32.2 g/dL (ref 30.0–36.0)
MCV: 94 fl (ref 78.0–100.0)
MONOS PCT: 4.2 % (ref 3.0–12.0)
Monocytes Absolute: 1.1 10*3/uL — ABNORMAL HIGH (ref 0.1–1.0)
NEUTROS ABS: 20.5 10*3/uL — AB (ref 1.4–7.7)
Neutrophils Relative %: 80.5 % — ABNORMAL HIGH (ref 43.0–77.0)
Platelets: 408 10*3/uL — ABNORMAL HIGH (ref 150.0–400.0)
RBC: 3.98 Mil/uL (ref 3.87–5.11)
RDW: 14.7 % (ref 11.5–15.5)
WBC: 25.4 10*3/uL (ref 4.0–10.5)

## 2013-11-09 NOTE — Telephone Encounter (Signed)
Will discuss with Dr. Lorin PicketScott when she calls.

## 2013-11-09 NOTE — Telephone Encounter (Signed)
Critical :  White cell count:  25.4  Was given to Hershey Companybecky

## 2013-11-09 NOTE — Telephone Encounter (Signed)
Per Dr. Lorin PicketScott, advised pt to be seen in UC today for elevated white count. Pt states she does not have transportation today, her daughter is busy. She states she is feeling more weak today, having slight burning with urination, but denies any other symptoms. Denies headache from fall yesterday. Urged pt to be seen tomorrow for evaluation. Given phone number to [facility] to call for Saturday appointments and they would have access to her records, labs. Pt verbalized understanding. Offered to call pt's daughter to notify as well and see if she could drive her tomorrow, pt declined, stated she would call herself. Pt declined to schedule Saturday appointment with me today due to questionable transportation issues. She did agree to be seen at [facility] tomorrow if unable to make Saturday clinic.

## 2013-11-10 ENCOUNTER — Inpatient Hospital Stay: Payer: Self-pay | Admitting: Internal Medicine

## 2013-11-10 ENCOUNTER — Ambulatory Visit: Payer: Self-pay | Admitting: Internal Medicine

## 2013-11-10 DIAGNOSIS — A419 Sepsis, unspecified organism: Secondary | ICD-10-CM | POA: Diagnosis not present

## 2013-11-10 DIAGNOSIS — J189 Pneumonia, unspecified organism: Secondary | ICD-10-CM | POA: Diagnosis present

## 2013-11-10 DIAGNOSIS — R32 Unspecified urinary incontinence: Secondary | ICD-10-CM | POA: Diagnosis present

## 2013-11-10 DIAGNOSIS — E860 Dehydration: Secondary | ICD-10-CM | POA: Diagnosis not present

## 2013-11-10 DIAGNOSIS — E872 Acidosis, unspecified: Secondary | ICD-10-CM | POA: Diagnosis not present

## 2013-11-10 DIAGNOSIS — R6510 Systemic inflammatory response syndrome (SIRS) of non-infectious origin without acute organ dysfunction: Secondary | ICD-10-CM | POA: Diagnosis not present

## 2013-11-10 DIAGNOSIS — I739 Peripheral vascular disease, unspecified: Secondary | ICD-10-CM | POA: Diagnosis present

## 2013-11-10 DIAGNOSIS — D72829 Elevated white blood cell count, unspecified: Secondary | ICD-10-CM | POA: Diagnosis present

## 2013-11-10 DIAGNOSIS — I951 Orthostatic hypotension: Secondary | ICD-10-CM | POA: Diagnosis present

## 2013-11-10 DIAGNOSIS — I959 Hypotension, unspecified: Secondary | ICD-10-CM | POA: Diagnosis not present

## 2013-11-10 DIAGNOSIS — R5381 Other malaise: Secondary | ICD-10-CM | POA: Diagnosis not present

## 2013-11-10 DIAGNOSIS — E871 Hypo-osmolality and hyponatremia: Secondary | ICD-10-CM | POA: Diagnosis present

## 2013-11-10 DIAGNOSIS — J449 Chronic obstructive pulmonary disease, unspecified: Secondary | ICD-10-CM | POA: Diagnosis not present

## 2013-11-10 DIAGNOSIS — J4489 Other specified chronic obstructive pulmonary disease: Secondary | ICD-10-CM | POA: Diagnosis not present

## 2013-11-10 DIAGNOSIS — R5383 Other fatigue: Secondary | ICD-10-CM | POA: Diagnosis not present

## 2013-11-10 DIAGNOSIS — E039 Hypothyroidism, unspecified: Secondary | ICD-10-CM | POA: Diagnosis present

## 2013-11-10 DIAGNOSIS — E785 Hyperlipidemia, unspecified: Secondary | ICD-10-CM | POA: Diagnosis present

## 2013-11-10 DIAGNOSIS — N179 Acute kidney failure, unspecified: Secondary | ICD-10-CM | POA: Diagnosis not present

## 2013-11-10 DIAGNOSIS — I10 Essential (primary) hypertension: Secondary | ICD-10-CM | POA: Diagnosis present

## 2013-11-10 DIAGNOSIS — N390 Urinary tract infection, site not specified: Secondary | ICD-10-CM | POA: Diagnosis not present

## 2013-11-10 LAB — CBC WITH DIFFERENTIAL/PLATELET
Basophil #: 0.2 10*3/uL — ABNORMAL HIGH (ref 0.0–0.1)
Basophil %: 0.8 %
Eosinophil #: 0.5 10*3/uL (ref 0.0–0.7)
Eosinophil %: 2.3 %
HCT: 36.3 % (ref 35.0–47.0)
HGB: 11.5 g/dL — ABNORMAL LOW (ref 12.0–16.0)
LYMPHS PCT: 12.3 %
Lymphocyte #: 2.5 10*3/uL (ref 1.0–3.6)
MCH: 29.7 pg (ref 26.0–34.0)
MCHC: 31.6 g/dL — ABNORMAL LOW (ref 32.0–36.0)
MCV: 94 fL (ref 80–100)
MONOS PCT: 6.9 %
Monocyte #: 1.4 x10 3/mm — ABNORMAL HIGH (ref 0.2–0.9)
Neutrophil #: 15.9 10*3/uL — ABNORMAL HIGH (ref 1.4–6.5)
Neutrophil %: 77.7 %
Platelet: 347 10*3/uL (ref 150–440)
RBC: 3.86 10*6/uL (ref 3.80–5.20)
RDW: 14.7 % — ABNORMAL HIGH (ref 11.5–14.5)
WBC: 20.5 10*3/uL — AB (ref 3.6–11.0)

## 2013-11-10 LAB — URINALYSIS, COMPLETE
BILIRUBIN, UR: NEGATIVE
Bacteria: NONE SEEN
Blood: NEGATIVE
Glucose,UR: NEGATIVE mg/dL (ref 0–75)
Hyaline Cast: 12
Ketone: NEGATIVE
Nitrite: NEGATIVE
PH: 5 (ref 4.5–8.0)
Protein: NEGATIVE
SPECIFIC GRAVITY: 1.014 (ref 1.003–1.030)
Squamous Epithelial: 3
WBC UR: 19 /HPF (ref 0–5)

## 2013-11-10 LAB — COMPREHENSIVE METABOLIC PANEL
ALT: 26 U/L (ref 12–78)
AST: 16 U/L (ref 15–37)
Albumin: 2.6 g/dL — ABNORMAL LOW (ref 3.4–5.0)
Alkaline Phosphatase: 70 U/L
Anion Gap: 8 (ref 7–16)
BUN: 29 mg/dL — ABNORMAL HIGH (ref 7–18)
Bilirubin,Total: 0.4 mg/dL (ref 0.2–1.0)
CALCIUM: 8.7 mg/dL (ref 8.5–10.1)
Chloride: 100 mmol/L (ref 98–107)
Co2: 25 mmol/L (ref 21–32)
Creatinine: 1.51 mg/dL — ABNORMAL HIGH (ref 0.60–1.30)
EGFR (African American): 36 — ABNORMAL LOW
GFR CALC NON AF AMER: 31 — AB
GLUCOSE: 249 mg/dL — AB (ref 65–99)
Osmolality: 281 (ref 275–301)
Potassium: 5 mmol/L (ref 3.5–5.1)
SODIUM: 133 mmol/L — AB (ref 136–145)
TOTAL PROTEIN: 5.8 g/dL — AB (ref 6.4–8.2)

## 2013-11-10 NOTE — Telephone Encounter (Signed)
Pt has recently been in the hospital.  Was treated for pneumonia with steroids and abx.  Off prednisone now.  White blood cell count increasing.  Pt informed that I have been out of the office this week.  Spoke to nurse yesterday.  Pt informed needed evaluation.  Was to assess current symptoms and recommended either ER or acute care evaluation.  See nursing note.  Pt to be evaluated.

## 2013-11-11 LAB — CBC WITH DIFFERENTIAL/PLATELET
BASOS ABS: 0.1 10*3/uL (ref 0.0–0.1)
BASOS PCT: 0.4 %
Eosinophil #: 0.5 10*3/uL (ref 0.0–0.7)
Eosinophil %: 2.8 %
HCT: 32.9 % — ABNORMAL LOW (ref 35.0–47.0)
HGB: 10.5 g/dL — ABNORMAL LOW (ref 12.0–16.0)
Lymphocyte #: 2.5 10*3/uL (ref 1.0–3.6)
Lymphocyte %: 15.1 %
MCH: 30 pg (ref 26.0–34.0)
MCHC: 31.9 g/dL — ABNORMAL LOW (ref 32.0–36.0)
MCV: 94 fL (ref 80–100)
Monocyte #: 1.4 x10 3/mm — ABNORMAL HIGH (ref 0.2–0.9)
Monocyte %: 8.3 %
NEUTROS PCT: 73.4 %
Neutrophil #: 12 10*3/uL — ABNORMAL HIGH (ref 1.4–6.5)
Platelet: 271 10*3/uL (ref 150–440)
RBC: 3.5 10*6/uL — AB (ref 3.80–5.20)
RDW: 14.3 % (ref 11.5–14.5)
WBC: 16.4 10*3/uL — AB (ref 3.6–11.0)

## 2013-11-11 LAB — BASIC METABOLIC PANEL
Anion Gap: 7 (ref 7–16)
BUN: 25 mg/dL — ABNORMAL HIGH (ref 7–18)
CHLORIDE: 105 mmol/L (ref 98–107)
Calcium, Total: 8.4 mg/dL — ABNORMAL LOW (ref 8.5–10.1)
Co2: 26 mmol/L (ref 21–32)
Creatinine: 1.38 mg/dL — ABNORMAL HIGH (ref 0.60–1.30)
EGFR (Non-African Amer.): 35 — ABNORMAL LOW
GFR CALC AF AMER: 40 — AB
GLUCOSE: 141 mg/dL — AB (ref 65–99)
Osmolality: 282 (ref 275–301)
Potassium: 4.8 mmol/L (ref 3.5–5.1)
Sodium: 138 mmol/L (ref 136–145)

## 2013-11-11 LAB — HEMOGLOBIN A1C: Hemoglobin A1C: 7.7 % — ABNORMAL HIGH (ref 4.2–6.3)

## 2013-11-11 LAB — MAGNESIUM: Magnesium: 1.8 mg/dL

## 2013-11-12 LAB — BASIC METABOLIC PANEL
Anion Gap: 5 — ABNORMAL LOW (ref 7–16)
BUN: 17 mg/dL (ref 7–18)
CO2: 27 mmol/L (ref 21–32)
CREATININE: 1.21 mg/dL (ref 0.60–1.30)
Calcium, Total: 8.3 mg/dL — ABNORMAL LOW (ref 8.5–10.1)
Chloride: 105 mmol/L (ref 98–107)
GFR CALC AF AMER: 47 — AB
GFR CALC NON AF AMER: 41 — AB
Glucose: 220 mg/dL — ABNORMAL HIGH (ref 65–99)
Osmolality: 282 (ref 275–301)
POTASSIUM: 4.3 mmol/L (ref 3.5–5.1)
Sodium: 137 mmol/L (ref 136–145)

## 2013-11-12 LAB — WBC: WBC: 9.3 10*3/uL (ref 3.6–11.0)

## 2013-11-12 LAB — URINE CULTURE

## 2013-11-14 ENCOUNTER — Other Ambulatory Visit: Payer: Medicare Other

## 2013-11-14 ENCOUNTER — Telehealth: Payer: Self-pay | Admitting: *Deleted

## 2013-11-14 ENCOUNTER — Ambulatory Visit: Payer: Medicare Other | Admitting: Internal Medicine

## 2013-11-14 NOTE — Telephone Encounter (Signed)
Please confirm with pt that she has been taking the medication regularly.  If so, ok to refill x 3

## 2013-11-14 NOTE — Telephone Encounter (Signed)
Fax from pharmacy requesting Allopurinol 100mg  Tablet.  Last refill 7.25.14, last OV 6.18.15.  Please advise refill.

## 2013-11-15 LAB — CULTURE, BLOOD (SINGLE)

## 2013-11-15 NOTE — Telephone Encounter (Signed)
Left message on VM for pt to return call.

## 2013-11-16 ENCOUNTER — Encounter: Payer: Self-pay | Admitting: Internal Medicine

## 2013-11-22 ENCOUNTER — Other Ambulatory Visit: Payer: Self-pay | Admitting: *Deleted

## 2013-11-22 MED ORDER — MIRABEGRON ER 25 MG PO TB24: 25.0000 mg | ORAL_TABLET | Freq: Every day | ORAL | Status: DC

## 2013-11-26 ENCOUNTER — Encounter: Payer: Self-pay | Admitting: Internal Medicine

## 2013-11-26 ENCOUNTER — Ambulatory Visit (INDEPENDENT_AMBULATORY_CARE_PROVIDER_SITE_OTHER): Payer: Medicare Other | Admitting: Internal Medicine

## 2013-11-26 VITALS — BP 100/70 | HR 98 | Temp 99.0°F | Ht 65.0 in | Wt 185.2 lb

## 2013-11-26 DIAGNOSIS — D72829 Elevated white blood cell count, unspecified: Secondary | ICD-10-CM | POA: Diagnosis not present

## 2013-11-26 DIAGNOSIS — E538 Deficiency of other specified B group vitamins: Secondary | ICD-10-CM | POA: Diagnosis not present

## 2013-11-26 DIAGNOSIS — I10 Essential (primary) hypertension: Secondary | ICD-10-CM

## 2013-11-26 DIAGNOSIS — R600 Localized edema: Secondary | ICD-10-CM

## 2013-11-26 DIAGNOSIS — R059 Cough, unspecified: Secondary | ICD-10-CM

## 2013-11-26 DIAGNOSIS — R05 Cough: Secondary | ICD-10-CM | POA: Diagnosis not present

## 2013-11-26 DIAGNOSIS — K219 Gastro-esophageal reflux disease without esophagitis: Secondary | ICD-10-CM

## 2013-11-26 DIAGNOSIS — E119 Type 2 diabetes mellitus without complications: Secondary | ICD-10-CM

## 2013-11-26 DIAGNOSIS — R609 Edema, unspecified: Secondary | ICD-10-CM

## 2013-11-26 MED ORDER — CYANOCOBALAMIN 1000 MCG/ML IJ SOLN
1000.0000 ug | Freq: Once | INTRAMUSCULAR | Status: AC
  Administered 2013-11-26: 1000 ug via INTRAMUSCULAR

## 2013-11-26 NOTE — Progress Notes (Signed)
Pre visit review using our clinic review tool, if applicable. No additional management support is needed unless otherwise documented below in the visit note. 

## 2013-11-27 ENCOUNTER — Encounter: Payer: Self-pay | Admitting: Internal Medicine

## 2013-11-27 ENCOUNTER — Telehealth: Payer: Self-pay | Admitting: *Deleted

## 2013-11-27 DIAGNOSIS — D72829 Elevated white blood cell count, unspecified: Secondary | ICD-10-CM | POA: Insufficient documentation

## 2013-11-27 LAB — CBC WITH DIFFERENTIAL/PLATELET
BASOS PCT: 0.3 % (ref 0.0–3.0)
Basophils Absolute: 0 10*3/uL (ref 0.0–0.1)
EOS ABS: 0.5 10*3/uL (ref 0.0–0.7)
Eosinophils Relative: 3.7 % (ref 0.0–5.0)
HCT: 35.4 % — ABNORMAL LOW (ref 36.0–46.0)
Hemoglobin: 11.3 g/dL — ABNORMAL LOW (ref 12.0–15.0)
LYMPHS ABS: 1.8 10*3/uL (ref 0.7–4.0)
Lymphocytes Relative: 13.2 % (ref 12.0–46.0)
MCHC: 31.9 g/dL (ref 30.0–36.0)
MCV: 93.9 fl (ref 78.0–100.0)
MONO ABS: 1.3 10*3/uL — AB (ref 0.1–1.0)
Monocytes Relative: 9.9 % (ref 3.0–12.0)
Neutro Abs: 9.8 10*3/uL — ABNORMAL HIGH (ref 1.4–7.7)
Neutrophils Relative %: 72.9 % (ref 43.0–77.0)
Platelets: 489 10*3/uL — ABNORMAL HIGH (ref 150.0–400.0)
RBC: 3.77 Mil/uL — ABNORMAL LOW (ref 3.87–5.11)
RDW: 15.4 % (ref 11.5–15.5)
WBC: 13.4 10*3/uL — AB (ref 4.0–10.5)

## 2013-11-27 LAB — BASIC METABOLIC PANEL
BUN: 22 mg/dL (ref 6–23)
CHLORIDE: 102 meq/L (ref 96–112)
CO2: 25 mEq/L (ref 19–32)
CREATININE: 1.1 mg/dL (ref 0.4–1.2)
Calcium: 9.3 mg/dL (ref 8.4–10.5)
GFR: 49.13 mL/min — AB (ref >60.00)
Glucose, Bld: 119 mg/dL — ABNORMAL HIGH (ref 70–99)
Potassium: 5.1 mEq/L (ref 3.5–5.1)
Sodium: 135 mEq/L (ref 135–145)

## 2013-11-27 NOTE — Assessment & Plan Note (Signed)
Low carb diet and exercise.  Follow met b and a1c.   Keep up to date with eye checks.

## 2013-11-27 NOTE — Assessment & Plan Note (Signed)
Has improved.  Follow.  Support hose.  Continue leg elevation.

## 2013-11-27 NOTE — Assessment & Plan Note (Signed)
Decreased her losartan to 25m last visit.  Blood pressure as outlined.  Follow.  Recheck met b today.

## 2013-11-27 NOTE — Telephone Encounter (Signed)
Pharmacist called states pt was to believe she was to receive an Rx for a yeast infection on 7.6.15.  Please advise

## 2013-11-27 NOTE — Assessment & Plan Note (Signed)
Symptoms controlled.  Follow.   

## 2013-11-27 NOTE — Assessment & Plan Note (Signed)
Recently treated for pneumonia and UTI.  Recheck cbc.

## 2013-11-27 NOTE — Telephone Encounter (Signed)
Need to know what her allergy is to doxycycline/tetracycline.  Let me know and will then get prescription.

## 2013-11-27 NOTE — Progress Notes (Signed)
Subjective:    [name], female    DOB: [birth year], 80 y.o.   MRN: [id number]  HPI 80 year old female with past history of colonic polyps, vascular disease, hypertension, reoccurring urinary tract infections and hypercholesterolemia who comes in today for a scheduled follow up/hospital follow up.  Was admitted 10/27/13-11/01/13 with pneumonia and UTI.  Treated with abx and steroids.  Discharged on prednisone and Ceftin.  She was readmitted 11/10/13 with sepsis (hypotension,etc).  Was felt to be dehydrated.  Treated with vancomycin and cefepime.  Hydrated.  Improved.  Since her discharge, she feels her energy is gradually improving.  Cough is better.  Eating better.  Has someone staying with her 5 days per week, preparing her meals and helping her with routine chores around her house.  She is accompanied by her daughter.  History obtained from both of them.  She is trying to walk daily to improve stamina.  Bowels stable.       Past Medical History  Diagnosis Date  . Hypertension   . Hypercholesterolemia   . Peripheral vascular disease   . GERD (gastroesophageal reflux disease)     hiatal hernia  . History of frequent urinary tract infections   . Diabetes mellitus   . History of colon polyps   . Hematuria     followed by urology  . Gout   . Humeral fracture 9/09    comminuted impacted proximal    Current Outpatient Prescriptions on File Prior to Visit  Medication Sig Dispense Refill  . albuterol (PROVENTIL HFA;VENTOLIN HFA) 108 (90 BASE) MCG/ACT inhaler Inhale 2 puffs into the lungs every 6 (six) hours as needed for wheezing or shortness of breath.  1 Inhaler  1  . allopurinol (ZYLOPRIM) 100 MG tablet Take 1 tablet (100 mg total) by mouth daily.  30 tablet  5  . Calcium Carbonate-Vitamin D (CALCIUM 600+D) 600-400 MG-UNIT per tablet Take 1 tablet by mouth daily.       . cetirizine (ZYRTEC) 10 MG tablet Take 10 mg by mouth daily.      . Cholecalciferol (VITAMIN D-3) 1000 UNITS  CAPS Take 1 capsule by mouth daily.      . Cranberry-Cholecalciferol 4200-500 MG-UNIT CAPS Take by mouth.      Marland Kitchen. FLUoxetine (PROZAC) 20 MG capsule Take 1 capsule (20 mg total) by mouth daily.  30 capsule  2  . Fluticasone-Salmeterol (ADVAIR DISKUS) 500-50 MCG/DOSE AEPB Inhale 1 puff into the lungs 2 (two) times daily.  60 each  1  . levothyroxine (SYNTHROID) 50 MCG tablet Take 1 tablet (50 mcg total) by mouth daily.  30 tablet  10  . losartan (COZAAR) 100 MG tablet Take 1 tablet (100 mg total) by mouth daily.  30 tablet  5  . methenamine (HIPREX) 1 G tablet Take 1 g by mouth 2 (two) times daily with a meal.      . mirabegron ER (MYRBETRIQ) 25 MG TB24 tablet Take 1 tablet (25 mg total) by mouth daily.  30 tablet  1  . montelukast (SINGULAIR) 10 MG tablet Take 1 tablet (10 mg total) by mouth at bedtime.  30 tablet  5  . pantoprazole (PROTONIX) 40 MG tablet Take 1 tablet (40 mg total) by mouth daily.  30 tablet  5  . simvastatin (ZOCOR) 20 MG tablet Take 1 tablet (20 mg total) by mouth daily.  30 tablet  5   No current facility-administered medications on file prior to visit.  Review of Systems Patient denies any headache, lightheadedness or dizziness.  Cough is better.  Breathing better.  Using advair regularly.   No chest pain, tightness or palpitations.  No nausea or vomiting.   No abdominal pain or cramping.  No acid reflux reported. Appetite better.   No bowel change, such as diarrhea, constipation, BRBPR or melana.  Energy gradually improving.  trying to walk daily.           Objective:   Physical Exam  Filed Vitals:   11/26/13 1408  BP: 100/70  Pulse: 98  Temp: 99 F (37.2 C)   Blood pressure recheck:  42112/6662  [age] year old female in no acute distress.   HEENT:  Nares- clear.  Oropharynx - without lesions. NECK:  Supple.  Nontender.  No audible bruit.  HEART:  Appears to be regular. LUNGS:  No crackles or wheezing audible.  Respirations even and unlabored. ABDOMEN:  Soft,  nontender.  Bowel sounds present and normal.  No audible abdominal bruit.    EXTREMITIES:  Increased minimal increased swelling - left worse than right.    DP pulses palpable and equal bilaterally.  Some stasis changes.  No infection.  Swelling improved.   FEET:  No lesions.        Assessment & Plan:  MSK.  S/p knee surgery.  Has been released.   CARDIOVASCULAR.  Stable.    HEALTH MAINTENANCE.  Physical last visit.  Colonoscopy 9/02 revealed diverticulosis.  Saw GI and they felt no further w/up warranted.  Previous bone density revealed osteopenia.  Mammogram 12/20/12 - recommended f/u views.  Follow up views 12/26/12 - Birads III.  Recommended f/u in 6 months.  She did not go for f/u mammogram.  Scheduled last visit for bilateral mammogram.     I spent 25 minutes with the patient and more than 50% of the time was spent in consultation regarding the above.

## 2013-11-27 NOTE — Assessment & Plan Note (Signed)
Recently admitted and treated for pneumonia.  See hospital records for details.  Using her advair regularly.  Walking daily.  Cough better.  Breathing better.  Has f/u with Dr Kendrick FriesMcQuaid next week.  Will hold on f/u cxr/scan until Dr Kendrick FriesMcQuaid reviews.

## 2013-11-27 NOTE — Telephone Encounter (Signed)
Left message on VM to return call 

## 2013-11-28 ENCOUNTER — Telehealth: Payer: Self-pay | Admitting: Internal Medicine

## 2013-11-28 MED ORDER — NYSTATIN 100000 UNIT/GM EX CREA: 1.0000 "application " | TOPICAL_CREAM | Freq: Two times a day (BID) | CUTANEOUS | Status: DC

## 2013-11-28 MED ORDER — NYSTATIN 100000 UNIT/GM EX POWD: Freq: Two times a day (BID) | CUTANEOUS | Status: DC

## 2013-11-28 NOTE — Telephone Encounter (Signed)
Notified of lab results via my chart.  

## 2013-11-28 NOTE — Telephone Encounter (Signed)
Sent in prescription for nystatin cream and powder to be used bid.  Discussed with pharmacist. No interaction he is aware of with nystatin and doxycycline.

## 2013-11-29 NOTE — Telephone Encounter (Signed)
Pt states the only thing she remembers is a rash.

## 2013-11-29 NOTE — Telephone Encounter (Signed)
Notify pt that I have already sent in the prescription for the cream and powder.

## 2013-11-29 NOTE — Telephone Encounter (Signed)
See below

## 2013-11-30 NOTE — Telephone Encounter (Signed)
Pt notified & has picked it up

## 2013-12-03 ENCOUNTER — Encounter: Payer: Self-pay | Admitting: Pulmonary Disease

## 2013-12-03 ENCOUNTER — Ambulatory Visit (INDEPENDENT_AMBULATORY_CARE_PROVIDER_SITE_OTHER): Payer: Medicare Other | Admitting: Pulmonary Disease

## 2013-12-03 VITALS — BP 122/64 | HR 101 | Ht 66.5 in | Wt 183.0 lb

## 2013-12-03 DIAGNOSIS — R059 Cough, unspecified: Secondary | ICD-10-CM

## 2013-12-03 DIAGNOSIS — J4489 Other specified chronic obstructive pulmonary disease: Secondary | ICD-10-CM

## 2013-12-03 DIAGNOSIS — J449 Chronic obstructive pulmonary disease, unspecified: Secondary | ICD-10-CM | POA: Diagnosis not present

## 2013-12-03 DIAGNOSIS — R05 Cough: Secondary | ICD-10-CM | POA: Diagnosis not present

## 2013-12-03 DIAGNOSIS — R29898 Other symptoms and signs involving the musculoskeletal system: Secondary | ICD-10-CM | POA: Insufficient documentation

## 2013-12-03 DIAGNOSIS — R29818 Other symptoms and signs involving the nervous system: Secondary | ICD-10-CM

## 2013-12-03 NOTE — Assessment & Plan Note (Addendum)
Diamond Newman has been feeling short of breath since coming home but she feels like it is due to weakness rather than a lung problem. Fortunately her lungs sound clear on exam today and she says that she has been doing better with the Advair at the higher dose. Her most recent chest x-ray shows complete resolution of her pneumonia.  Plan: -Continue Advair 500/50 -Refer to pulmonary rehabilitation for deconditioning and shortness of breath -Followup 3 months

## 2013-12-03 NOTE — Assessment & Plan Note (Signed)
This has picked up in the last few days with an increase in sinus congestion which sounds like allergic rhinitis. Her lungs were clear today and she does not have other signs of pneumonia.  Plan: -Lloyd HugerNeil med rinses -Nasacort -Zyrtec

## 2013-12-03 NOTE — Assessment & Plan Note (Signed)
Pulmonary rehabilitation referral 

## 2013-12-03 NOTE — Progress Notes (Signed)
Subjective:    [name], female    DOB: 04-22-1927, 80 y.o.   MRN: [id number]  Synopsis: First referred to the [facility] pulmonary in 2015 for chronic obstructive asthma. She never smoked.  HPI  12/03/2013> Unfortunately since the last visit Diamond Newman was hospitalized twice. Once for pneumonia and a urinary tract infection and then a second time for dehydration. She says that since coming home she's been moving slower but her breathing has been okay. She does have shortness of breath with regular activity such as taking a bath but she attributes this to generalize weakness rather than a lung problem. For the last 3-4 days she has had increasing sinus congestion, runny nose, and cough associated with that. She does not feel that the cough is coming from her lungs. She has been taking Advair 500/50 for the last several weeks and she says that this seems to be working better than the 250/50 dose. She has not had purulent cough, chest pain, or fever or chills.  Past Medical History  Diagnosis Date  . Hypertension   . Hypercholesterolemia   . Peripheral vascular disease   . GERD (gastroesophageal reflux disease)     hiatal hernia  . History of frequent urinary tract infections   . Diabetes mellitus   . History of colon polyps   . Hematuria     followed by urology  . Gout   . Humeral fracture 9/09    comminuted impacted proximal    Review of Systems  Constitutional: Positive for fatigue. Negative for fever and chills.  HENT: Positive for postnasal drip, rhinorrhea and sinus pressure.   Respiratory: Positive for cough and shortness of breath. Negative for wheezing.   Cardiovascular: Negative for chest pain, palpitations and leg swelling.       Objective:   Physical Exam Filed Vitals:   12/03/13 1643  BP: 122/64  Pulse: 101  Height: 5' 6.5" (1.689 m)  Weight: 183 lb (83.008 kg)  SpO2: 95%    RA  Gen:  no acute distress HEENT: NCAT, EOMi, OP  clear PULM: Few crackles left base otherwise clear, good air movement CV: RRR, no mgr, no JVD AB: BS+, soft, nontender, no hsm Ext: warm, trace leg edema, no clubbing, no cyanosis  11/10/2013 chest x-ray normal  06/2011 PFT [facility]> Ratio 70%, Small Airways disease on loop, FEV1 1.63L (93% pred), FEF 25-75% 0.90 (68% pred), TLC 4.81 L (98% pred), DLCO 9.5 (44% pred) 07/2013 PFT > Loop> SMALL AIRWAYS DISEASE; Ratio 70%, FEV1 1.49L (81% pred, 1% change with BD), FEF 25-75% 0.83 (42% pred), TLC 4.59 L (90% pred), DLCO 10.8 (50% pred)     Assessment & Plan:   Chronic obstructive asthma Diamond Newman has been feeling short of breath since coming home but she feels like it is due to weakness rather than a lung problem. Fortunately her lungs sound clear on exam today and she says that she has been doing better with the Advair at the higher dose. Her most recent chest x-ray shows complete resolution of her pneumonia.  Plan: -Continue Advair 500/50 -Refer to pulmonary rehabilitation for deconditioning and shortness of breath -Followup 3 months  Cough This has picked up in the last few days with an increase in sinus congestion which sounds like allergic rhinitis. Her lungs were clear today and she does not have other signs of pneumonia.  Plan: -Diamond Newman med rinses -Nasacort -Zyrtec  Muscular deconditioning Pulmonary rehabilitation referral    Updated Medication  List Outpatient Encounter Prescriptions as of 12/03/2013  Medication Sig  . albuterol (PROVENTIL HFA;VENTOLIN HFA) 108 (90 BASE) MCG/ACT inhaler Inhale 2 puffs into the lungs every 6 (six) hours as needed for wheezing or shortness of breath.  . allopurinol (ZYLOPRIM) 100 MG tablet Take 1 tablet (100 mg total) by mouth daily.  . Calcium Carbonate-Vitamin D (CALCIUM 600+D) 600-400 MG-UNIT per tablet Take 1 tablet by mouth daily.   . cetirizine (ZYRTEC) 10 MG tablet Take 10 mg by mouth daily.  . Cholecalciferol (VITAMIN D-3) 1000 UNITS CAPS  Take 1 capsule by mouth daily.  . Cranberry-Cholecalciferol 4200-500 MG-UNIT CAPS Take by mouth.  Marland Kitchen. FLUoxetine (PROZAC) 20 MG capsule Take 1 capsule (20 mg total) by mouth daily.  . Fluticasone-Salmeterol (ADVAIR DISKUS) 500-50 MCG/DOSE AEPB Inhale 1 puff into the lungs 2 (two) times daily.  Marland Kitchen. levothyroxine (SYNTHROID) 50 MCG tablet Take 1 tablet (50 mcg total) by mouth daily.  Marland Kitchen. losartan (COZAAR) 100 MG tablet Take 1 tablet (100 mg total) by mouth daily.  . methenamine (HIPREX) 1 G tablet Take 1 g by mouth 2 (two) times daily with a meal.  . mirabegron ER (MYRBETRIQ) 25 MG TB24 tablet Take 1 tablet (25 mg total) by mouth daily.  . montelukast (SINGULAIR) 10 MG tablet Take 1 tablet (10 mg total) by mouth at bedtime.  Marland Kitchen. nystatin (MYCOSTATIN) powder Apply topically 2 (two) times daily.  Marland Kitchen. nystatin cream (MYCOSTATIN) Apply 1 application topically 2 (two) times daily.  . pantoprazole (PROTONIX) 40 MG tablet Take 1 tablet (40 mg total) by mouth daily.  . simvastatin (ZOCOR) 20 MG tablet Take 1 tablet (20 mg total) by mouth daily.

## 2013-12-03 NOTE — Patient Instructions (Signed)
We will refer [facility] to [facility] Keep taking your Advair as you are doing For your sinuses: Use Lloyd HugerNeil Med rinses with distilled water at least twice per day using the instructions on the package. 1/2 hour after using the [facility] Med rinse, use Nasacort two puffs in each nostril once per day.  Remember that the Nasacort can take 1-2 weeks to work after regular use. Use generic zyrtec (cetirizine) every day.  If this doesn't help, then stop taking it and use chlorpheniramine-phenylephrine combination tablets.  We will see you back in 3 months or sooner if needed

## 2013-12-04 ENCOUNTER — Other Ambulatory Visit: Payer: Self-pay | Admitting: *Deleted

## 2013-12-04 MED ORDER — SIMVASTATIN 20 MG PO TABS: 20.0000 mg | ORAL_TABLET | Freq: Every day | ORAL | Status: DC

## 2013-12-05 ENCOUNTER — Encounter: Payer: Self-pay | Admitting: Internal Medicine

## 2013-12-18 ENCOUNTER — Other Ambulatory Visit: Payer: Self-pay | Admitting: *Deleted

## 2013-12-18 MED ORDER — LEVOTHYROXINE SODIUM 50 MCG PO TABS: 50.0000 ug | ORAL_TABLET | Freq: Every day | ORAL | Status: DC

## 2013-12-18 MED ORDER — ALLOPURINOL 100 MG PO TABS: 100.0000 mg | ORAL_TABLET | Freq: Every day | ORAL | Status: DC

## 2013-12-18 NOTE — Telephone Encounter (Signed)
Okay to refill? Pt states that the Methenamine was originally filled by Dr. Raynald [place]empWindish in [place].

## 2013-12-18 NOTE — Telephone Encounter (Signed)
If has been taking and doing well, the ok to refill x 3.

## 2013-12-19 MED ORDER — METHENAMINE HIPPURATE 1 G PO TABS: 1.0000 g | ORAL_TABLET | Freq: Two times a day (BID) | ORAL | Status: DC

## 2014-01-07 ENCOUNTER — Encounter: Payer: Self-pay | Admitting: Internal Medicine

## 2014-01-07 ENCOUNTER — Ambulatory Visit (INDEPENDENT_AMBULATORY_CARE_PROVIDER_SITE_OTHER): Payer: Medicare Other | Admitting: Internal Medicine

## 2014-01-07 VITALS — BP 110/62 | HR 82 | Resp 14 | Ht 66.5 in | Wt 184.0 lb

## 2014-01-07 DIAGNOSIS — R600 Localized edema: Secondary | ICD-10-CM

## 2014-01-07 DIAGNOSIS — E538 Deficiency of other specified B group vitamins: Secondary | ICD-10-CM | POA: Diagnosis not present

## 2014-01-07 DIAGNOSIS — R05 Cough: Secondary | ICD-10-CM

## 2014-01-07 DIAGNOSIS — E559 Vitamin D deficiency, unspecified: Secondary | ICD-10-CM

## 2014-01-07 DIAGNOSIS — I10 Essential (primary) hypertension: Secondary | ICD-10-CM

## 2014-01-07 DIAGNOSIS — E119 Type 2 diabetes mellitus without complications: Secondary | ICD-10-CM

## 2014-01-07 DIAGNOSIS — R609 Edema, unspecified: Secondary | ICD-10-CM

## 2014-01-07 DIAGNOSIS — R059 Cough, unspecified: Secondary | ICD-10-CM | POA: Diagnosis not present

## 2014-01-07 DIAGNOSIS — K219 Gastro-esophageal reflux disease without esophagitis: Secondary | ICD-10-CM

## 2014-01-07 DIAGNOSIS — E669 Obesity, unspecified: Secondary | ICD-10-CM

## 2014-01-07 DIAGNOSIS — J449 Chronic obstructive pulmonary disease, unspecified: Secondary | ICD-10-CM

## 2014-01-07 DIAGNOSIS — E7800 Pure hypercholesterolemia, unspecified: Secondary | ICD-10-CM

## 2014-01-07 DIAGNOSIS — Z87440 Personal history of urinary (tract) infections: Secondary | ICD-10-CM

## 2014-01-07 DIAGNOSIS — D72829 Elevated white blood cell count, unspecified: Secondary | ICD-10-CM

## 2014-01-07 DIAGNOSIS — I739 Peripheral vascular disease, unspecified: Secondary | ICD-10-CM

## 2014-01-07 MED ORDER — CYANOCOBALAMIN 1000 MCG/ML IJ SOLN
1000.0000 ug | Freq: Once | INTRAMUSCULAR | Status: AC
  Administered 2014-01-07: 1000 ug via INTRAMUSCULAR

## 2014-01-07 NOTE — Progress Notes (Signed)
Pre visit review using our clinic review tool, if applicable. No additional management support is needed unless otherwise documented below in the visit note. 

## 2014-01-08 ENCOUNTER — Encounter: Payer: Self-pay | Admitting: Internal Medicine

## 2014-01-08 ENCOUNTER — Telehealth: Payer: Self-pay

## 2014-01-08 NOTE — Assessment & Plan Note (Signed)
Improved. Continue current medication regimen

## 2014-01-08 NOTE — Assessment & Plan Note (Signed)
Asymptomatic.  Varying pressures in each arm.  Stable.   

## 2014-01-08 NOTE — Assessment & Plan Note (Signed)
Improved on last check.  Recheck cbc with next labs.

## 2014-01-08 NOTE — Telephone Encounter (Signed)
Morrie Sheldonshley i jsut checked with [email or web address]  pul rehab he said he never got it however Alida still has her copy and we will refax it Tobe SosSally E Ottinger

## 2014-01-08 NOTE — Assessment & Plan Note (Signed)
Discussed diet and exercise.  Follow.  

## 2014-01-08 NOTE — Telephone Encounter (Signed)
Thank you Almyra FreeLibby.  Nothing further needed.

## 2014-01-08 NOTE — Assessment & Plan Note (Signed)
On replacement.  Follow.  

## 2014-01-08 NOTE — Assessment & Plan Note (Signed)
Has not required abx recently.  No longer following with urology.    

## 2014-01-08 NOTE — Assessment & Plan Note (Signed)
Continue B12 injections.   

## 2014-01-08 NOTE — Assessment & Plan Note (Signed)
On Losartan 100mg q day.   Blood pressure as outlined.  Follow.    

## 2014-01-08 NOTE — Assessment & Plan Note (Signed)
Seeing Dr Kendrick FriesMcQuaid.  Using 500/50 Advair bid.  Cough is better.  See Dr Ulyses JarredMcQuaid's note for details.  Planning for pulmonary rehab.  Will check with pulmonary regarding referral.

## 2014-01-08 NOTE — Progress Notes (Signed)
Subjective:    [name], female    DOB: 01/28/1927, 30[name]87 y.o.   MRN: [id number]  HPI 80 year old female with past history of colonic polyps, vascular disease, hypertension, reoccurring urinary tract infections and hypercholesterolemia who comes in today for a scheduled follow up.   Was admitted 10/27/13-11/01/13 with pneumonia and UTI.  Treated with abx and steroids.  Discharged on prednisone and Ceftin.  She was readmitted 11/10/13 with sepsis (hypotension,etc).  Was felt to be dehydrated.  Treated with vancomycin and cefepime.  Hydrated.  Improved.  Since her discharge, she feels her energy is gradually improving.  Still with fatigue.  Cough is better.  She is seeing Dr Kendrick FriesMcQuaid.  Planning for pulmonary rehab.  Using Advair 500/50 bid. Taking singulair at night.   She is accompanied by her daughter.  History obtained from both of them.   Eating and drinking well.  Bowels stable.  No urinary symptoms.       Past Medical History  Diagnosis Date  . Hypertension   . Hypercholesterolemia   . Peripheral vascular disease   . GERD (gastroesophageal reflux disease)     hiatal hernia  . History of frequent urinary tract infections   . Diabetes mellitus   . History of colon polyps   . Hematuria     followed by urology  . Gout   . Humeral fracture 9/09    comminuted impacted proximal    Current Outpatient Prescriptions on File Prior to Visit  Medication Sig Dispense Refill  . albuterol (PROVENTIL HFA;VENTOLIN HFA) 108 (90 BASE) MCG/ACT inhaler Inhale 2 puffs into the lungs every 6 (six) hours as needed for wheezing or shortness of breath.  1 Inhaler  1  . allopurinol (ZYLOPRIM) 100 MG tablet Take 1 tablet (100 mg total) by mouth daily.  30 tablet  5  . Calcium Carbonate-Vitamin D (CALCIUM 600+D) 600-400 MG-UNIT per tablet Take 1 tablet by mouth daily.       . cetirizine (ZYRTEC) 10 MG tablet Take 10 mg by mouth daily.      . Cholecalciferol (VITAMIN D-3) 1000 UNITS CAPS Take 1 capsule  by mouth daily.      . Cranberry-Cholecalciferol 4200-500 MG-UNIT CAPS Take by mouth.      Marland Kitchen. FLUoxetine (PROZAC) 20 MG capsule Take 1 capsule (20 mg total) by mouth daily.  30 capsule  2  . Fluticasone-Salmeterol (ADVAIR DISKUS) 500-50 MCG/DOSE AEPB Inhale 1 puff into the lungs 2 (two) times daily.  60 each  1  . levothyroxine (SYNTHROID) 50 MCG tablet Take 1 tablet (50 mcg total) by mouth daily.  30 tablet  5  . losartan (COZAAR) 100 MG tablet Take 1 tablet (100 mg total) by mouth daily.  30 tablet  5  . methenamine (HIPREX) 1 G tablet Take 1 tablet (1 g total) by mouth 2 (two) times daily with a meal.  60 tablet  3  . mirabegron ER (MYRBETRIQ) 25 MG TB24 tablet Take 1 tablet (25 mg total) by mouth daily.  30 tablet  1  . montelukast (SINGULAIR) 10 MG tablet Take 1 tablet (10 mg total) by mouth at bedtime.  30 tablet  5  . pantoprazole (PROTONIX) 40 MG tablet Take 1 tablet (40 mg total) by mouth daily.  30 tablet  5  . simvastatin (ZOCOR) 20 MG tablet Take 1 tablet (20 mg total) by mouth daily.  30 tablet  5   No current facility-administered medications on file prior to visit.  Review of Systems Patient denies any headache, lightheadedness or dizziness.  Cough is better.  Breathing better.  Using advair regularly.   No chest pain, tightness or palpitations.  No nausea or vomiting.   No abdominal pain or cramping.  No acid reflux reported. Appetite better.   No bowel change, such as diarrhea, constipation, BRBPR or melana.  Still with increased fatigue.  Just returned from a trip to the beach.            Objective:   Physical Exam  Filed Vitals:   01/07/14 1604  BP: 110/62  Pulse: 82  Resp: 14   Blood pressure recheck:  52-78/90  80 year old female in no acute distress.   HEENT:  Nares- clear.  Oropharynx - without lesions. NECK:  Supple.  Nontender.  No audible bruit.  HEART:  Appears to be regular. LUNGS:  No crackles or wheezing audible.  Respirations even and unlabored.  No  increased cough with forced expiration. ABDOMEN:  Soft, nontender.  Bowel sounds present and normal.  No audible abdominal bruit.    EXTREMITIES:  Minimal increased swelling - pedal and ankle.   DP pulses palpable and equal bilaterally.  Some stasis changes.  No increased erythema.   FEET:  No lesions.        Assessment & Plan:  MSK.  S/p knee surgery.  Has been released.   CARDIOVASCULAR.  Stable.    HEALTH MAINTENANCE.  Physical 10/23/13.  Colonoscopy 9/02 revealed diverticulosis.  Saw GI and they felt no further w/up warranted.  Previous bone density revealed osteopenia.  Mammogram 12/20/12 - recommended f/u views.  Follow up views 12/26/12 - Birads III.  Recommended f/u in 6 months.  She did not go for f/u mammogram.  She is scheduled Monday for mammogram.     I spent 25 minutes with the patient and more than 50% of the time was spent in consultation regarding the above.

## 2014-01-08 NOTE — Assessment & Plan Note (Signed)
Symptoms controlled.  Follow.   

## 2014-01-08 NOTE — Telephone Encounter (Signed)
[facility], was asking about the status of her referral to pulmonary rehab.  Please advise on pt's status.  Thank you.

## 2014-01-08 NOTE — Assessment & Plan Note (Signed)
Low carb diet and exercise.  Follow met b and a1c.   Last a1c 6/15 elevated (7.4).  She had been on an increased amount of prednisone.  She is monitoring her diet.  Follow.  Stay up to date with eye checks.

## 2014-01-08 NOTE — Assessment & Plan Note (Signed)
Follow.  Support hose.  Continue leg elevation.

## 2014-01-08 NOTE — Assessment & Plan Note (Signed)
Continue simvastatin.  Low cholesterol diet.  Follow lipid panel and liver function.   

## 2014-01-09 ENCOUNTER — Other Ambulatory Visit: Payer: Self-pay | Admitting: *Deleted

## 2014-01-09 MED ORDER — FLUOXETINE HCL 20 MG PO CAPS: 20.0000 mg | ORAL_CAPSULE | Freq: Every day | ORAL | Status: DC

## 2014-01-15 ENCOUNTER — Telehealth: Payer: Self-pay | Admitting: Pulmonary Disease

## 2014-01-15 ENCOUNTER — Encounter: Payer: Self-pay | Admitting: Pulmonary Disease

## 2014-01-15 NOTE — Telephone Encounter (Signed)
Lung works at [facility] for pulmonary rehab is calling and wanting a copy of the PFT faxed to them.  I do not see in the pts chart where this has been done.  BQ please advise. Thanks  Allergies  Allergen Reactions  . Ace Inhibitors Cough  . Amoxicillin   . Codeine   . Sulfa Antibiotics Other (See Comments)    GI upset  . Tetracyclines & Related   . Vibramycin [Doxycycline Calcium]   . Ciprofloxacin Hcl Rash    Current Outpatient Prescriptions on File Prior to Visit  Medication Sig Dispense Refill  . albuterol (PROVENTIL HFA;VENTOLIN HFA) 108 (90 BASE) MCG/ACT inhaler Inhale 2 puffs into the lungs every 6 (six) hours as needed for wheezing or shortness of breath.  1 Inhaler  1  . allopurinol (ZYLOPRIM) 100 MG tablet Take 1 tablet (100 mg total) by mouth daily.  30 tablet  5  . Calcium Carbonate-Vitamin D (CALCIUM 600+D) 600-400 MG-UNIT per tablet Take 1 tablet by mouth daily.       . cetirizine (ZYRTEC) 10 MG tablet Take 10 mg by mouth daily.      . Cholecalciferol (VITAMIN D-3) 1000 UNITS CAPS Take 1 capsule by mouth daily.      . Cranberry-Cholecalciferol 4200-500 MG-UNIT CAPS Take by mouth.      Marland Kitchen FLUoxetine (PROZAC) 20 MG capsule Take 1 capsule (20 mg total) by mouth daily.  30 capsule  2  . Fluticasone-Salmeterol (ADVAIR DISKUS) 500-50 MCG/DOSE AEPB Inhale 1 puff into the lungs 2 (two) times daily.  60 each  1  . levothyroxine (SYNTHROID) 50 MCG tablet Take 1 tablet (50 mcg total) by mouth daily.  30 tablet  5  . losartan (COZAAR) 100 MG tablet Take 1 tablet (100 mg total) by mouth daily.  30 tablet  5  . methenamine (HIPREX) 1 G tablet Take 1 tablet (1 g total) by mouth 2 (two) times daily with a meal.  60 tablet  3  . mirabegron ER (MYRBETRIQ) 25 MG TB24 tablet Take 1 tablet (25 mg total) by mouth daily.  30 tablet  1  . montelukast (SINGULAIR) 10 MG tablet Take 1 tablet (10 mg total) by mouth at bedtime.  30 tablet  5  . pantoprazole (PROTONIX) 40 MG tablet Take 1 tablet (40 mg total)  by mouth daily.  30 tablet  5  . simvastatin (ZOCOR) 20 MG tablet Take 1 tablet (20 mg total) by mouth daily.  30 tablet  5   No current facility-administered medications on file prior to visit.

## 2014-01-15 NOTE — Telephone Encounter (Signed)
Will forward to ashley to follow up on paper work for PFT.  thanks

## 2014-01-15 NOTE — Telephone Encounter (Signed)
I think it was done at [facility], I can't see the scanned report either, have them try to get a copy from Dr. Lillette Boxer office.  If it is not at [place] office then she needs a new one

## 2014-01-16 ENCOUNTER — Ambulatory Visit: Payer: Self-pay | Admitting: Internal Medicine

## 2014-01-16 DIAGNOSIS — R928 Other abnormal and inconclusive findings on diagnostic imaging of breast: Secondary | ICD-10-CM | POA: Diagnosis not present

## 2014-01-16 DIAGNOSIS — N6489 Other specified disorders of breast: Secondary | ICD-10-CM | POA: Diagnosis not present

## 2014-01-23 ENCOUNTER — Ambulatory Visit: Payer: Self-pay | Admitting: Internal Medicine

## 2014-01-23 DIAGNOSIS — N63 Unspecified lump in unspecified breast: Secondary | ICD-10-CM | POA: Diagnosis not present

## 2014-01-23 LAB — HM MAMMOGRAPHY

## 2014-01-23 NOTE — Telephone Encounter (Signed)
Morrie Sheldon, Please advise in status of phone note. Thanks.

## 2014-01-23 NOTE — Telephone Encounter (Signed)
pft results have been requested X2, waiting on report to forward.

## 2014-01-25 ENCOUNTER — Encounter: Payer: Self-pay | Admitting: Internal Medicine

## 2014-01-25 DIAGNOSIS — R928 Other abnormal and inconclusive findings on diagnostic imaging of breast: Secondary | ICD-10-CM | POA: Insufficient documentation

## 2014-01-29 NOTE — Telephone Encounter (Signed)
Diamond Newman, have you received these results? Thanks.

## 2014-01-29 NOTE — Telephone Encounter (Signed)
This has been received and faxed.  Nothing further needed.

## 2014-02-11 ENCOUNTER — Ambulatory Visit (INDEPENDENT_AMBULATORY_CARE_PROVIDER_SITE_OTHER): Payer: Medicare Other | Admitting: *Deleted

## 2014-02-11 DIAGNOSIS — E538 Deficiency of other specified B group vitamins: Secondary | ICD-10-CM

## 2014-02-11 MED ORDER — CYANOCOBALAMIN 1000 MCG/ML IJ SOLN
1000.0000 ug | Freq: Once | INTRAMUSCULAR | Status: AC
  Administered 2014-02-11: 1000 ug via INTRAMUSCULAR

## 2014-02-14 ENCOUNTER — Other Ambulatory Visit: Payer: Self-pay | Admitting: *Deleted

## 2014-02-14 MED ORDER — PANTOPRAZOLE SODIUM 40 MG PO TBEC: 40.0000 mg | DELAYED_RELEASE_TABLET | Freq: Every day | ORAL | Status: DC

## 2014-02-19 ENCOUNTER — Encounter: Payer: Self-pay | Admitting: Pulmonary Disease

## 2014-02-19 DIAGNOSIS — J45909 Unspecified asthma, uncomplicated: Secondary | ICD-10-CM | POA: Diagnosis not present

## 2014-02-19 DIAGNOSIS — J449 Chronic obstructive pulmonary disease, unspecified: Secondary | ICD-10-CM | POA: Diagnosis not present

## 2014-02-19 DIAGNOSIS — Z5189 Encounter for other specified aftercare: Secondary | ICD-10-CM | POA: Diagnosis not present

## 2014-02-21 ENCOUNTER — Encounter: Payer: Self-pay | Admitting: Pulmonary Disease

## 2014-02-21 DIAGNOSIS — J449 Chronic obstructive pulmonary disease, unspecified: Secondary | ICD-10-CM | POA: Diagnosis not present

## 2014-02-21 DIAGNOSIS — J45909 Unspecified asthma, uncomplicated: Secondary | ICD-10-CM | POA: Diagnosis not present

## 2014-02-27 ENCOUNTER — Telehealth: Payer: Self-pay | Admitting: Pulmonary Disease

## 2014-02-27 ENCOUNTER — Encounter: Payer: Self-pay | Admitting: Pulmonary Disease

## 2014-02-27 NOTE — Telephone Encounter (Signed)
Called and spoke with pt and she stated that she cannot come to the appt on 10/14 due to her daughter not being off of work and she is the one that brings her to these appts..  Pt wanted to see if we could get her worked in on Monday to see BQ.  Morrie Sheldonshley please advise. Thanks  Allergies  Allergen Reactions  . Ace Inhibitors Cough  . Amoxicillin   . Codeine   . Sulfa Antibiotics Other (See Comments)    GI upset  . Tetracyclines & Related   . Vibramycin [Doxycycline Calcium]   . Ciprofloxacin Hcl Rash    Current Outpatient Prescriptions on File Prior to Visit  Medication Sig Dispense Refill  . albuterol (PROVENTIL HFA;VENTOLIN HFA) 108 (90 BASE) MCG/ACT inhaler Inhale 2 puffs into the lungs every 6 (six) hours as needed for wheezing or shortness of breath.  1 Inhaler  1  . allopurinol (ZYLOPRIM) 100 MG tablet Take 1 tablet (100 mg total) by mouth daily.  30 tablet  5  . Calcium Carbonate-Vitamin D (CALCIUM 600+D) 600-400 MG-UNIT per tablet Take 1 tablet by mouth daily.       . cetirizine (ZYRTEC) 10 MG tablet Take 10 mg by mouth daily.      . Cholecalciferol (VITAMIN D-3) 1000 UNITS CAPS Take 1 capsule by mouth daily.      . Cranberry-Cholecalciferol 4200-500 MG-UNIT CAPS Take by mouth.      Marland Kitchen. FLUoxetine (PROZAC) 20 MG capsule Take 1 capsule (20 mg total) by mouth daily.  30 capsule  2  . Fluticasone-Salmeterol (ADVAIR DISKUS) 500-50 MCG/DOSE AEPB Inhale 1 puff into the lungs 2 (two) times daily.  60 each  1  . levothyroxine (SYNTHROID) 50 MCG tablet Take 1 tablet (50 mcg total) by mouth daily.  30 tablet  5  . losartan (COZAAR) 100 MG tablet Take 1 tablet (100 mg total) by mouth daily.  30 tablet  5  . methenamine (HIPREX) 1 G tablet Take 1 tablet (1 g total) by mouth 2 (two) times daily with a meal.  60 tablet  3  . mirabegron ER (MYRBETRIQ) 25 MG TB24 tablet Take 1 tablet (25 mg total) by mouth daily.  30 tablet  1  . montelukast (SINGULAIR) 10 MG tablet Take 1 tablet (10 mg total) by  mouth at bedtime.  30 tablet  5  . pantoprazole (PROTONIX) 40 MG tablet Take 1 tablet (40 mg total) by mouth daily.  30 tablet  5  . simvastatin (ZOCOR) 20 MG tablet Take 1 tablet (20 mg total) by mouth daily.  30 tablet  5   No current facility-administered medications on file prior to visit.

## 2014-02-28 NOTE — Telephone Encounter (Signed)
Noted. Nothing further needed. 

## 2014-02-28 NOTE — Telephone Encounter (Signed)
ATC patient- no answer and no voicemail-rang several times.   Diamond Newman, after looking on BQ schedule on Monday-there are no openings-okay to double book? (the only spots on schedule state do not use per TD). Please advise. Thanks.

## 2014-02-28 NOTE — Telephone Encounter (Signed)
There are a few open appt slots for Monday if she can make one of those.

## 2014-02-28 NOTE — Telephone Encounter (Signed)
Can you see if she can come in at 8:45?  We are so full that day already.

## 2014-02-28 NOTE — Telephone Encounter (Signed)
Called pt. She is scheduled to see BQ at 8:45 on 03/04/14 in [place]. Will make Diamond Newman aware.

## 2014-03-04 ENCOUNTER — Ambulatory Visit (INDEPENDENT_AMBULATORY_CARE_PROVIDER_SITE_OTHER): Payer: Medicare Other | Admitting: Pulmonary Disease

## 2014-03-04 ENCOUNTER — Encounter: Payer: Self-pay | Admitting: Pulmonary Disease

## 2014-03-04 VITALS — BP 116/64 | HR 102 | Ht 66.5 in | Wt 178.0 lb

## 2014-03-04 DIAGNOSIS — J449 Chronic obstructive pulmonary disease, unspecified: Secondary | ICD-10-CM

## 2014-03-04 MED ORDER — FLUTICASONE-SALMETEROL 250-50 MCG/DOSE IN AEPB: 1.0000 | INHALATION_SPRAY | Freq: Two times a day (BID) | RESPIRATORY_TRACT | Status: DC

## 2014-03-04 NOTE — Progress Notes (Signed)
Subjective:    [name], female    DOB: 1926-08-06, 80 y.o.   MRN: [id number]  Synopsis: First referred to the [facility] in 2015 for chronic obstructive asthma. She never smoked.  HPI  Chief Complaint  Patient presents with  . Follow-up    3 month rov.  c/o occasional congestion collecting in throat, no other complaints at this time.   Pt states she loves pulm rehab, states she is stronger and breathing better since starting.     03/04/2014 ROV > Diamond DaltonMarie hsa been enjoying pulmonary rehab. She went three days last week.  She said that she is feeling stronger in her legs already even though she has only been three times.  Her breathing is a little better since going there as well.  She has had some phlegm in her throat which she believes is related to the change in the weather.  Still using Advair 500/50 twice a day. Hasn't had a flu shot yet. Taking Zyrtec but not Nasonex.   Past Medical History  Diagnosis Date  . Hypertension   . Hypercholesterolemia   . Peripheral vascular disease   . GERD (gastroesophageal reflux disease)     hiatal hernia  . History of frequent urinary tract infections   . Diabetes mellitus   . History of colon polyps   . Hematuria     followed by urology  . Gout   . Humeral fracture 9/09    comminuted impacted proximal    Review of Systems  Constitutional: Negative for fever, chills and fatigue.  HENT: Negative for postnasal drip, rhinorrhea and sinus pressure.   Respiratory: Negative for cough, shortness of breath and wheezing.   Cardiovascular: Negative for chest pain, palpitations and leg swelling.       Objective   Physical Exam  Filed Vitals:   03/04/14 0900  BP: 116/64  Pulse: 102  Height: 5' 6.5" (1.689 m)  Weight: 178 lb (80.74 kg)  SpO2: 96%    RA  Gen:  no acute distress HEENT: NCAT, EOMi, OP clear PULM: CTA bilaterally, no wheezing CV: RRR, no mgr, no JVD AB: BS+, soft, nontender, no hsm Ext:  warm, trace leg edema, no clubbing, no cyanosis  11/10/2013 chest x-ray normal  06/2011 PFT [facility]> Ratio 70%, Small Airways disease on loop, FEV1 1.63L (93% pred), FEF 25-75% 0.90 (68% pred), TLC 4.81 L (98% pred), DLCO 9.5 (44% pred) 07/2013 PFT > Loop> SMALL AIRWAYS DISEASE; Ratio 70%, FEV1 1.49L (81% pred, 1% change with BD), FEF 25-75% 0.83 (42% pred), TLC 4.59 L (90% pred), DLCO 10.8 (50% pred) 02/2014 6MW 490 feet, 95% RA     Assessment & Plan:   Chronic obstructive airway disease with asthma This has been a stable interval for Diamond Newman.  She is doing well with pulmonary rehab.  Plan: -flu shot today -get the prevnar vaccine on Monday when she sees Dr. Lorin PicketScott -continue Advair, will reduce dose to 250/50 given stability -continue pulmonary rehab -f/u 6 months    Updated Medication List Outpatient Encounter Prescriptions as of 03/04/2014  Medication Sig  . albuterol (PROVENTIL HFA;VENTOLIN HFA) 108 (90 BASE) MCG/ACT inhaler Inhale 2 puffs into the lungs every 6 (six) hours as needed for wheezing or shortness of breath.  . allopurinol (ZYLOPRIM) 100 MG tablet Take 1 tablet (100 mg total) by mouth daily.  . Calcium Carbonate-Vitamin D (CALCIUM 600+D) 600-400 MG-UNIT per tablet Take 1 tablet by mouth daily.   . cetirizine (ZYRTEC)  10 MG tablet Take 10 mg by mouth daily.  . Cholecalciferol (VITAMIN D-3) 1000 UNITS CAPS Take 1 capsule by mouth daily.  . Cranberry-Cholecalciferol 4200-500 MG-UNIT CAPS Take by mouth.  Marland Kitchen. FLUoxetine (PROZAC) 20 MG capsule Take 1 capsule (20 mg total) by mouth daily.  . Fluticasone-Salmeterol (ADVAIR DISKUS) 500-50 MCG/DOSE AEPB Inhale 1 puff into the lungs 2 (two) times daily.  Marland Kitchen. levothyroxine (SYNTHROID) 50 MCG tablet Take 1 tablet (50 mcg total) by mouth daily.  Marland Kitchen. losartan (COZAAR) 100 MG tablet Take 1 tablet (100 mg total) by mouth daily.  . methenamine (HIPREX) 1 G tablet Take 1 tablet (1 g total) by mouth 2 (two) times daily with a meal.  .  mirabegron ER (MYRBETRIQ) 25 MG TB24 tablet Take 1 tablet (25 mg total) by mouth daily.  . montelukast (SINGULAIR) 10 MG tablet Take 1 tablet (10 mg total) by mouth at bedtime.  . pantoprazole (PROTONIX) 40 MG tablet Take 1 tablet (40 mg total) by mouth daily.  . simvastatin (ZOCOR) 20 MG tablet Take 1 tablet (20 mg total) by mouth daily.

## 2014-03-04 NOTE — Assessment & Plan Note (Signed)
This has been a stable interval for Diamond Newman.  She is doing well with pulmonary rehab.  Plan: -flu shot today -get the prevnar vaccine on Monday when she sees Dr. Lorin PicketScott -continue Advair, will reduce dose to 250/50 given stability -continue pulmonary rehab -f/u 6 months

## 2014-03-04 NOTE — Patient Instructions (Signed)
We will decrease your dose of Advair to 250/50 one puff twice a day Get the PREVNAR vaccine next Monday when you see Dr. Lorin PicketScott We will see you back in 6 months or sooner if needed

## 2014-03-06 ENCOUNTER — Ambulatory Visit: Payer: Medicare Other | Admitting: Pulmonary Disease

## 2014-03-08 ENCOUNTER — Other Ambulatory Visit: Payer: Self-pay | Admitting: *Deleted

## 2014-03-08 ENCOUNTER — Other Ambulatory Visit (INDEPENDENT_AMBULATORY_CARE_PROVIDER_SITE_OTHER): Payer: Medicare Other

## 2014-03-08 DIAGNOSIS — E559 Vitamin D deficiency, unspecified: Secondary | ICD-10-CM

## 2014-03-08 DIAGNOSIS — D72829 Elevated white blood cell count, unspecified: Secondary | ICD-10-CM

## 2014-03-08 DIAGNOSIS — E119 Type 2 diabetes mellitus without complications: Secondary | ICD-10-CM | POA: Diagnosis not present

## 2014-03-08 DIAGNOSIS — E7800 Pure hypercholesterolemia, unspecified: Secondary | ICD-10-CM

## 2014-03-08 LAB — CBC WITH DIFFERENTIAL/PLATELET
BASOS ABS: 0 10*3/uL (ref 0.0–0.1)
Basophils Relative: 0.4 % (ref 0.0–3.0)
EOS ABS: 0.6 10*3/uL (ref 0.0–0.7)
Eosinophils Relative: 5.9 % — ABNORMAL HIGH (ref 0.0–5.0)
HCT: 38.7 % (ref 36.0–46.0)
HEMOGLOBIN: 12.2 g/dL (ref 12.0–15.0)
Lymphocytes Relative: 27.8 % (ref 12.0–46.0)
Lymphs Abs: 3 10*3/uL (ref 0.7–4.0)
MCHC: 31.5 g/dL (ref 30.0–36.0)
MCV: 91.8 fl (ref 78.0–100.0)
MONO ABS: 0.9 10*3/uL (ref 0.1–1.0)
Monocytes Relative: 8.4 % (ref 3.0–12.0)
NEUTROS ABS: 6.3 10*3/uL (ref 1.4–7.7)
Neutrophils Relative %: 57.5 % (ref 43.0–77.0)
Platelets: 302 10*3/uL (ref 150.0–400.0)
RBC: 4.22 Mil/uL (ref 3.87–5.11)
RDW: 16.3 % — ABNORMAL HIGH (ref 11.5–15.5)
WBC: 10.9 10*3/uL — ABNORMAL HIGH (ref 4.0–10.5)

## 2014-03-08 LAB — HEPATIC FUNCTION PANEL
ALBUMIN: 3.1 g/dL — AB (ref 3.5–5.2)
ALT: 18 U/L (ref 0–35)
AST: 27 U/L (ref 0–37)
Alkaline Phosphatase: 62 U/L (ref 39–117)
Bilirubin, Direct: 0.1 mg/dL (ref 0.0–0.3)
Total Bilirubin: 0.6 mg/dL (ref 0.2–1.2)
Total Protein: 6.8 g/dL (ref 6.0–8.3)

## 2014-03-08 LAB — BASIC METABOLIC PANEL
BUN: 27 mg/dL — ABNORMAL HIGH (ref 6–23)
CALCIUM: 9.7 mg/dL (ref 8.4–10.5)
CO2: 25 meq/L (ref 19–32)
Chloride: 105 mEq/L (ref 96–112)
Creatinine, Ser: 1.4 mg/dL — ABNORMAL HIGH (ref 0.4–1.2)
GFR: 38.27 mL/min — ABNORMAL LOW (ref >60.00)
Glucose, Bld: 143 mg/dL — ABNORMAL HIGH (ref 70–99)
Potassium: 4.9 mEq/L (ref 3.5–5.1)
SODIUM: 139 meq/L (ref 135–145)

## 2014-03-08 LAB — LIPID PANEL
CHOL/HDL RATIO: 4
Cholesterol: 142 mg/dL (ref 0–200)
HDL: 34.4 mg/dL — AB (ref >39.00)
LDL Cholesterol: 87 mg/dL (ref 0–99)
NONHDL: 107.6
Triglycerides: 102 mg/dL (ref 0.0–149.0)
VLDL: 20.4 mg/dL (ref 0.0–40.0)

## 2014-03-08 LAB — VITAMIN D 25 HYDROXY (VIT D DEFICIENCY, FRACTURES): VITD: 52.89 ng/mL (ref 30.00–100.00)

## 2014-03-08 LAB — HEMOGLOBIN A1C: Hgb A1c MFr Bld: 6.7 % — ABNORMAL HIGH (ref 4.6–6.5)

## 2014-03-08 MED ORDER — MIRABEGRON ER 25 MG PO TB24: 25.0000 mg | ORAL_TABLET | Freq: Every day | ORAL | Status: DC

## 2014-03-09 ENCOUNTER — Encounter: Payer: Self-pay | Admitting: Internal Medicine

## 2014-03-11 ENCOUNTER — Encounter: Payer: Self-pay | Admitting: Internal Medicine

## 2014-03-11 ENCOUNTER — Ambulatory Visit (INDEPENDENT_AMBULATORY_CARE_PROVIDER_SITE_OTHER): Payer: Medicare Other | Admitting: Internal Medicine

## 2014-03-11 VITALS — BP 110/60 | HR 104 | Temp 98.3°F | Ht 66.5 in | Wt 181.2 lb

## 2014-03-11 DIAGNOSIS — E7800 Pure hypercholesterolemia, unspecified: Secondary | ICD-10-CM

## 2014-03-11 DIAGNOSIS — E669 Obesity, unspecified: Secondary | ICD-10-CM

## 2014-03-11 DIAGNOSIS — K219 Gastro-esophageal reflux disease without esophagitis: Secondary | ICD-10-CM | POA: Diagnosis not present

## 2014-03-11 DIAGNOSIS — I739 Peripheral vascular disease, unspecified: Secondary | ICD-10-CM | POA: Diagnosis not present

## 2014-03-11 DIAGNOSIS — D72829 Elevated white blood cell count, unspecified: Secondary | ICD-10-CM

## 2014-03-11 DIAGNOSIS — E538 Deficiency of other specified B group vitamins: Secondary | ICD-10-CM

## 2014-03-11 DIAGNOSIS — R928 Other abnormal and inconclusive findings on diagnostic imaging of breast: Secondary | ICD-10-CM

## 2014-03-11 DIAGNOSIS — J449 Chronic obstructive pulmonary disease, unspecified: Secondary | ICD-10-CM

## 2014-03-11 DIAGNOSIS — I10 Essential (primary) hypertension: Secondary | ICD-10-CM | POA: Diagnosis not present

## 2014-03-11 DIAGNOSIS — R600 Localized edema: Secondary | ICD-10-CM

## 2014-03-11 DIAGNOSIS — E119 Type 2 diabetes mellitus without complications: Secondary | ICD-10-CM

## 2014-03-11 NOTE — Progress Notes (Signed)
Pre visit review using our clinic review tool, if applicable. No additional management support is needed unless otherwise documented below in the visit note. 

## 2014-03-12 NOTE — Assessment & Plan Note (Signed)
White count last checked 03/08/14 - 10.9.  Improved.  Follow.

## 2014-03-12 NOTE — Assessment & Plan Note (Signed)
Low carb diet and exercise.  Follow met b and a1c.   Last a1c 03/08/14 improved - 6.7.    She is monitoring her diet.  Follow.  Stay up to date with eye checks.  Due to see Dr Ellin Mayhew in December.

## 2014-03-12 NOTE — Assessment & Plan Note (Signed)
Seeing Dr Kendrick FriesMcQuaid.  He instructed her to decrease advair to 250/50 Cough is better.  See Dr Ulyses JarredMcQuaid's note for details.  Going to pulmonary rehab.  Feels better.  Breathing better.

## 2014-03-12 NOTE — Assessment & Plan Note (Signed)
Symptoms controlled.  Follow.   

## 2014-03-12 NOTE — Assessment & Plan Note (Signed)
Asymptomatic.  Varying pressures in each arm.  Stable.   

## 2014-03-12 NOTE — Assessment & Plan Note (Signed)
Mammogram 01/16/14 recommended f/u views.  01/23/14 right breast mammogram with ultrasound - Birads III.  Recommended 6 month f/u - due 06/2014.

## 2014-03-12 NOTE — Assessment & Plan Note (Signed)
Diet   Exercise

## 2014-03-12 NOTE — Assessment & Plan Note (Signed)
On Losartan 100mg  q day.   Blood pressure as outlined.  Follow.

## 2014-03-12 NOTE — Progress Notes (Signed)
Subjective:    [name], female    DOB: 1927-01-17, 80 y.o.   MRN: [id number]  HPI 80 year old female with past history of colonic polyps, vascular disease, hypertension, reoccurring urinary tract infections and hypercholesterolemia who comes in today for a scheduled follow up.   Was admitted 10/27/13-11/01/13 with pneumonia and UTI.  Treated with abx and steroids.  Discharged on prednisone and Ceftin.  She was readmitted 11/10/13 with sepsis (hypotension,etc).  Was felt to be dehydrated.  Treated with vancomycin and cefepime.  Hydrated.  Improved.  She is accompanied by her daughter.  History obtained from both of them.   Cough is better.  She is seeing Dr Lake Bells.  Just evaluated.  He decreased her Advair to 250/50 bid. Taking singulair at night.   Going to pulmonary rehab.  Enjoys going.  Feels better.  Is exercising.   Eating and drinking well.  Bowels stable.  No urinary symptoms.  Overall feels better.       Past Medical History  Diagnosis Date  . Hypertension   . Hypercholesterolemia   . Peripheral vascular disease   . GERD (gastroesophageal reflux disease)     hiatal hernia  . History of frequent urinary tract infections   . Diabetes mellitus   . History of colon polyps   . Hematuria     followed by urology  . Gout   . Humeral fracture 9/09    comminuted impacted proximal    Current Outpatient Prescriptions on File Prior to Visit  Medication Sig Dispense Refill  . albuterol (PROVENTIL HFA;VENTOLIN HFA) 108 (90 BASE) MCG/ACT inhaler Inhale 2 puffs into the lungs every 6 (six) hours as needed for wheezing or shortness of breath.  1 Inhaler  1  . allopurinol (ZYLOPRIM) 100 MG tablet Take 1 tablet (100 mg total) by mouth daily.  30 tablet  5  . Calcium Carbonate-Vitamin D (CALCIUM 600+D) 600-400 MG-UNIT per tablet Take 1 tablet by mouth daily.       . cetirizine (ZYRTEC) 10 MG tablet Take 10 mg by mouth daily.      . Cholecalciferol (VITAMIN D-3) 1000 UNITS CAPS Take 1  capsule by mouth daily.      . Cranberry-Cholecalciferol 4200-500 MG-UNIT CAPS Take by mouth.      Marland Kitchen FLUoxetine (PROZAC) 20 MG capsule Take 1 capsule (20 mg total) by mouth daily.  30 capsule  2  . Fluticasone-Salmeterol (ADVAIR DISKUS) 250-50 MCG/DOSE AEPB Inhale 1 puff into the lungs 2 (two) times daily.  1 each  5  . levothyroxine (SYNTHROID) 50 MCG tablet Take 1 tablet (50 mcg total) by mouth daily.  30 tablet  5  . losartan (COZAAR) 100 MG tablet Take 1 tablet (100 mg total) by mouth daily.  30 tablet  5  . methenamine (HIPREX) 1 G tablet Take 1 tablet (1 g total) by mouth 2 (two) times daily with a meal.  60 tablet  3  . mirabegron ER (MYRBETRIQ) 25 MG TB24 tablet Take 1 tablet (25 mg total) by mouth daily.  30 tablet  1  . montelukast (SINGULAIR) 10 MG tablet Take 1 tablet (10 mg total) by mouth at bedtime.  30 tablet  5  . pantoprazole (PROTONIX) 40 MG tablet Take 1 tablet (40 mg total) by mouth daily.  30 tablet  5  . simvastatin (ZOCOR) 20 MG tablet Take 1 tablet (20 mg total) by mouth daily.  30 tablet  5   No current facility-administered medications  on file prior to visit.    Review of Systems Patient denies any headache, lightheadedness or dizziness.  Cough is better.  Breathing better.  Using advair regularly.   No chest pain, tightness or palpitations.  No nausea or vomiting.   No abdominal pain or cramping.  No acid reflux reported. Appetite better.   No bowel change, such as diarrhea, constipation, BRBPR or melana.  Going to pulmonary rehab.  Energy better.             Objective:   Physical Exam  Filed Vitals:   03/11/14 1430  BP: 110/60  Pulse: 104  Temp: 98.3 F (36.8 C)   Blood pressure recheck:  108-29/32-42  80 year old female in no acute distress.   HEENT:  Nares- clear.  Oropharynx - without lesions. NECK:  Supple.  Nontender.  No audible bruit.  HEART:  Appears to be regular. LUNGS:  No crackles or wheezing audible.  Respirations even and unlabored.  No  increased cough with forced expiration. ABDOMEN:  Soft, nontender.  Bowel sounds present and normal.  No audible abdominal bruit.    EXTREMITIES:  Improved.  No signficant swelling.    DP pulses palpable and equal bilaterally.  Some stasis changes.  No increased erythema.   FEET:  No lesions.        Assessment & Plan:  MSK.  S/p knee surgery.  Has been released.   CARDIOVASCULAR.  Stable.    HEALTH MAINTENANCE.  Physical 10/23/13.  Colonoscopy 9/02 revealed diverticulosis.  Saw GI and they felt no further w/up warranted.  Previous bone density revealed osteopenia.  Mammogram 12/20/12 - recommended f/u views.  Follow up views 12/26/12 - Birads III.  Recommended f/u in 6 months.  She did not go for f/u mammogram.  Had mammogram 01/23/14 - Birads III.  Recommend another 6 month f/u.     Problem List Items Addressed This Visit   Abnormal mammogram     Mammogram 01/16/14 recommended f/u views.  01/23/14 right breast mammogram with ultrasound - Birads III.  Recommended 6 month f/u - due 06/2014.      Chronic obstructive airway disease with asthma     Seeing Dr Lake Bells.  He instructed her to decrease advair to 250/50 Cough is better.  See Dr Anastasia Pall note for details.  Going to pulmonary rehab.  Feels better.  Breathing better.          Diabetes mellitus     Low carb diet and exercise.  Follow met b and a1c.   Last a1c 03/08/14 improved - 6.7.    She is monitoring her diet.  Follow.  Stay up to date with eye checks.  Due to see Dr Ellin Mayhew in December.       GERD (gastroesophageal reflux disease)     Symptoms controlled.  Follow.      Hypercholesterolemia     Continue simvastatin.  Low cholesterol diet.  Follow lipid panel and liver function.      Hypertension - Primary     On Losartan 163m q day.   Blood pressure as outlined.  Follow.       Leukocytosis     White count last checked 03/08/14 - 10.9.  Improved.  Follow.      Lower extremity edema     Improved.      Obesity (BMI 30.0-34.9)      Diet.  Exercise.       Peripheral vascular disease     Asymptomatic.  Varying pressures in each arm.  Stable.      Vitamin B12 deficiency     Continue B12 injections.        I spent 25 minutes with the patient and more than 50% of the time was spent in consultation regarding the above.

## 2014-03-12 NOTE — Assessment & Plan Note (Signed)
Continue simvastatin.  Low cholesterol diet.  Follow lipid panel and liver function.

## 2014-03-12 NOTE — Assessment & Plan Note (Signed)
Continue B12 injections.   

## 2014-03-12 NOTE — Assessment & Plan Note (Signed)
Improved

## 2014-03-14 ENCOUNTER — Ambulatory Visit: Payer: Medicare Other

## 2014-03-19 ENCOUNTER — Ambulatory Visit: Payer: Medicare Other

## 2014-03-19 ENCOUNTER — Telehealth: Payer: Self-pay | Admitting: *Deleted

## 2014-03-19 ENCOUNTER — Ambulatory Visit (INDEPENDENT_AMBULATORY_CARE_PROVIDER_SITE_OTHER): Payer: Medicare Other | Admitting: *Deleted

## 2014-03-19 DIAGNOSIS — E538 Deficiency of other specified B group vitamins: Secondary | ICD-10-CM | POA: Diagnosis not present

## 2014-03-19 MED ORDER — CYANOCOBALAMIN 1000 MCG/ML IJ SOLN
1000.0000 ug | Freq: Once | INTRAMUSCULAR | Status: AC
  Administered 2014-03-19: 1000 ug via INTRAMUSCULAR

## 2014-03-19 MED ORDER — NYSTATIN 100000 UNIT/ML MT SUSP: 5.0000 mL | Freq: Three times a day (TID) | OROMUCOSAL | Status: DC

## 2014-03-19 NOTE — Telephone Encounter (Signed)
Called pharmacy

## 2014-03-19 NOTE — Telephone Encounter (Signed)
rx sent in for nystatin swish and spit tid.

## 2014-03-19 NOTE — Telephone Encounter (Signed)
Pt in for B12, still having problem of thrush with Advair use. States Dr. Lorin PicketScott told her she would call in something if she had persistent problems. Uses [facility]

## 2014-03-19 NOTE — Telephone Encounter (Signed)
I sent in rx for nystatin tid.  (please call [facility]) and clarify directions are 5cc's swish and spit tid.  Thanks.

## 2014-03-24 ENCOUNTER — Encounter: Payer: Self-pay | Admitting: Pulmonary Disease

## 2014-03-24 DIAGNOSIS — J45909 Unspecified asthma, uncomplicated: Secondary | ICD-10-CM | POA: Diagnosis not present

## 2014-04-22 ENCOUNTER — Ambulatory Visit (INDEPENDENT_AMBULATORY_CARE_PROVIDER_SITE_OTHER): Payer: Medicare Other | Admitting: *Deleted

## 2014-04-22 DIAGNOSIS — E538 Deficiency of other specified B group vitamins: Secondary | ICD-10-CM | POA: Diagnosis not present

## 2014-04-22 MED ORDER — CYANOCOBALAMIN 1000 MCG/ML IJ SOLN
1000.0000 ug | Freq: Once | INTRAMUSCULAR | Status: AC
  Administered 2014-04-22: 1000 ug via INTRAMUSCULAR

## 2014-04-23 ENCOUNTER — Ambulatory Visit: Payer: Medicare Other

## 2014-04-23 ENCOUNTER — Encounter: Payer: Self-pay | Admitting: Pulmonary Disease

## 2014-04-23 DIAGNOSIS — J45909 Unspecified asthma, uncomplicated: Secondary | ICD-10-CM | POA: Diagnosis not present

## 2014-05-13 ENCOUNTER — Other Ambulatory Visit: Payer: Self-pay | Admitting: *Deleted

## 2014-05-13 MED ORDER — FLUOXETINE HCL 20 MG PO CAPS: 20.0000 mg | ORAL_CAPSULE | Freq: Every day | ORAL | Status: DC

## 2014-05-14 ENCOUNTER — Encounter: Payer: Self-pay | Admitting: *Deleted

## 2014-05-28 ENCOUNTER — Ambulatory Visit (INDEPENDENT_AMBULATORY_CARE_PROVIDER_SITE_OTHER): Payer: Medicare Other

## 2014-05-28 DIAGNOSIS — E538 Deficiency of other specified B group vitamins: Secondary | ICD-10-CM | POA: Diagnosis not present

## 2014-05-28 MED ORDER — CYANOCOBALAMIN 1000 MCG/ML IJ SOLN
1000.0000 ug | Freq: Once | INTRAMUSCULAR | Status: AC
  Administered 2014-05-28: 1000 ug via INTRAMUSCULAR

## 2014-05-31 ENCOUNTER — Telehealth: Payer: Self-pay | Admitting: *Deleted

## 2014-05-31 NOTE — Telephone Encounter (Signed)
Noted.  Reviewed.  Will call or be seen if sx persist.

## 2014-05-31 NOTE — Telephone Encounter (Signed)
This is the same message that I received on a different pt.  I think that one of these messages is entered in on the wrong pt.  Will need to clarify the correct pt.  If this is the correct message and this is the correct pt, then she needs evaluation today.  I am unable to see her this pm.  She sees pulmonology [facility] as well. I don't know if they have something.  If not, recommend either [facility] or [facility].

## 2014-05-31 NOTE — Telephone Encounter (Signed)
Chief Complaint Cough Initial Comment Caller states Diamond Newman is coughing a lot. PreDisposition Call Doctor Nurse Assessment Nurse: [name], RN, [name] Date/Time [name](Eastern Time): 05/31/2014 10:27:57 AM Confirm and document reason for call. If symptomatic, describe symptoms. ---Patient states that has had a cough for about a week now. She had been taking the Mucinex, Advair. She has asthma and COPD. She gave me permission to speak with the caller. She has not used her nebulizer in the last 24 hours. Her cough is worse today. Denies fever. Has the patient traveled out of the country within the last 30 days? ---No Does the patient require triage? ---Yes Related visit to physician within the last 2 weeks? ---No Does the PT have any chronic conditions? (i.e. diabetes, asthma, etc.) ---Yes List chronic conditions. ---Asthma, COPD, HTN, Hypercholesterolemia Guidelines Guideline Title Affirmed Question Affirmed Notes Nurse Date/Time (Eastern Time) Asthma Attack [1] Wheezing or coughing AND [2] hasn't used neb or inhaler twice AND [3] it's available [name], RN, [name] 05/31/2014 10:33:26 AM Asthma Attack MILD asthma attack (e.g., no SOB at rest, mild SOB with walking, speaks normally in sentences, mild wheezing) (all triage questions negative) [name], RN, [name] 05/31/2014 11:12:03 AM  Disp. Time [name](Eastern Time) Disposition Final User 05/31/2014 10:35:12 AM Urgent Home Treatment with Follow-Up Call [name], RN, [name] 05/31/2014 10:36:12 AM Send To RN Personal [name], RN, [name] 05/31/2014 11:17:18 AM Home Care Yes [name], RN, [name] Caller Understands: Yes Disagree/Comply: [name] Caller Understands: Yes Disagree/Comply: Comply Care Advice Given Per Guideline URGENT HOME TREATMENT WITH FOLLOW-UP CALL CALL CENTER PROVIDES RN CALL-BACKS: * I'll call you back in 30-60 minutes to see how you are doing * You become worse before RN follow-up call ASTHMA QUICK-RELIEF MEDICINE: * You become worse. * Inhaled asthma  medicine (neb or MDI) is needed more often than every 4 hours After Care Instructions Given

## 2014-05-31 NOTE — Telephone Encounter (Signed)
Spoke to pt, advised of recommendations. Confirmed she did speak to triage nurse this morning, confirmed symptoms. [facility] States since using Albuterol inhaler, symptoms have improved, no cough since use. Advised if symptoms persist or worsen, [facility]. Denies fever. FYI

## 2014-06-11 ENCOUNTER — Other Ambulatory Visit: Payer: Self-pay | Admitting: *Deleted

## 2014-06-11 MED ORDER — MIRABEGRON ER 25 MG PO TB24: 25.0000 mg | ORAL_TABLET | Freq: Every day | ORAL | Status: DC

## 2014-06-21 ENCOUNTER — Other Ambulatory Visit: Payer: Self-pay | Admitting: *Deleted

## 2014-06-21 MED ORDER — ALLOPURINOL 100 MG PO TABS: 100.0000 mg | ORAL_TABLET | Freq: Every day | ORAL | Status: DC

## 2014-06-21 MED ORDER — LEVOTHYROXINE SODIUM 50 MCG PO TABS: 50.0000 ug | ORAL_TABLET | Freq: Every day | ORAL | Status: DC

## 2014-07-02 ENCOUNTER — Ambulatory Visit (INDEPENDENT_AMBULATORY_CARE_PROVIDER_SITE_OTHER): Payer: Medicare Other | Admitting: *Deleted

## 2014-07-02 ENCOUNTER — Ambulatory Visit (INDEPENDENT_AMBULATORY_CARE_PROVIDER_SITE_OTHER): Payer: Medicare Other | Admitting: Internal Medicine

## 2014-07-02 ENCOUNTER — Encounter: Payer: Self-pay | Admitting: Internal Medicine

## 2014-07-02 VITALS — BP 120/80 | HR 84 | Temp 99.1°F | Ht 66.5 in

## 2014-07-02 DIAGNOSIS — R05 Cough: Secondary | ICD-10-CM | POA: Diagnosis not present

## 2014-07-02 DIAGNOSIS — I10 Essential (primary) hypertension: Secondary | ICD-10-CM

## 2014-07-02 DIAGNOSIS — E538 Deficiency of other specified B group vitamins: Secondary | ICD-10-CM

## 2014-07-02 DIAGNOSIS — E119 Type 2 diabetes mellitus without complications: Secondary | ICD-10-CM

## 2014-07-02 DIAGNOSIS — R059 Cough, unspecified: Secondary | ICD-10-CM

## 2014-07-02 MED ORDER — PREDNISONE 10 MG PO TABS: ORAL_TABLET | ORAL | Status: DC

## 2014-07-02 MED ORDER — FLUTICASONE-SALMETEROL 500-50 MCG/DOSE IN AEPB: 1.0000 | INHALATION_SPRAY | Freq: Two times a day (BID) | RESPIRATORY_TRACT | Status: DC

## 2014-07-02 MED ORDER — CYANOCOBALAMIN 1000 MCG/ML IJ SOLN
1000.0000 ug | Freq: Once | INTRAMUSCULAR | Status: AC
  Administered 2014-07-02: 1000 ug via INTRAMUSCULAR

## 2014-07-02 MED ORDER — ALBUTEROL SULFATE HFA 108 (90 BASE) MCG/ACT IN AERS: 2.0000 | INHALATION_SPRAY | Freq: Four times a day (QID) | RESPIRATORY_TRACT | Status: DC | PRN

## 2014-07-02 MED ORDER — AZITHROMYCIN 250 MG PO TABS: ORAL_TABLET | ORAL | Status: DC

## 2014-07-02 NOTE — Patient Instructions (Signed)
Saline nasal spray - flush nose at least 2-3x/day  Change advair to 500/50 - one inhalation twice a day.    Mucinex in the am and Robitussin in the evening.    Take the prednisone and the antibiotic as directed.

## 2014-07-02 NOTE — Progress Notes (Signed)
[name], female   DOB: June 26, 1926, 81 y.o.   MRN: [id number].   Subjective:    [name], female    DOB: July 27, 1926, 81 y.o.   MRN: [id number]  HPI  Patient here as a work in with concerns regarding increased cough, congestion and increased sob.  She has noticed some increased cough and congestion over the last few weeks.  Took mucinex for a while.  Some better with mucinex.  She stopped.  Has increased nasal congestion.  Increased chest congestion.  Productive white mucus.   Some increased weakness and occasional light headedness.  Has been eating.  Decreased appetite.  No vomiting.  Some diarrhea.  Using her advair.  Rescue inhaler prn.  Cough and congestion worsened recently.  Sob with exertion.     Past Medical History  Diagnosis Date  . Hypertension   . Hypercholesterolemia   . Peripheral vascular disease   . GERD (gastroesophageal reflux disease)     hiatal hernia  . History of frequent urinary tract infections   . Diabetes mellitus   . History of colon polyps   . Hematuria     followed by urology  . Gout   . Humeral fracture 9/09    comminuted impacted proximal    Current Outpatient Prescriptions on File Prior to Visit  Medication Sig Dispense Refill  . albuterol (PROVENTIL HFA;VENTOLIN HFA) 108 (90 BASE) MCG/ACT inhaler Inhale 2 puffs into the lungs every 6 (six) hours as needed for wheezing or shortness of breath. 1 Inhaler 1  . allopurinol (ZYLOPRIM) 100 MG tablet Take 1 tablet (100 mg total) by mouth daily. 30 tablet 5  . Calcium Carbonate-Vitamin D (CALCIUM 600+D) 600-400 MG-UNIT per tablet Take 1 tablet by mouth daily.     . cetirizine (ZYRTEC) 10 MG tablet Take 10 mg by mouth daily.    . Cholecalciferol (VITAMIN D-3) 1000 UNITS CAPS Take 1 capsule by mouth daily.    . Cranberry-Cholecalciferol 4200-500 MG-UNIT CAPS Take by mouth.    Marland Kitchen FLUoxetine (PROZAC) 20 MG capsule Take 1 capsule (20 mg total) by mouth daily. 30 capsule 2  .  Fluticasone-Salmeterol (ADVAIR DISKUS) 250-50 MCG/DOSE AEPB Inhale 1 puff into the lungs 2 (two) times daily. 1 each 5  . levothyroxine (SYNTHROID) 50 MCG tablet Take 1 tablet (50 mcg total) by mouth daily. 30 tablet 5  . losartan (COZAAR) 100 MG tablet Take 1 tablet (100 mg total) by mouth daily. 30 tablet 5  . methenamine (HIPREX) 1 G tablet Take 1 tablet (1 g total) by mouth 2 (two) times daily with a meal. 60 tablet 3  . mirabegron ER (MYRBETRIQ) 25 MG TB24 tablet Take 1 tablet (25 mg total) by mouth daily. 30 tablet 5  . montelukast (SINGULAIR) 10 MG tablet Take 1 tablet (10 mg total) by mouth at bedtime. 30 tablet 5  . nystatin (MYCOSTATIN) 100000 UNIT/ML suspension Take 5 mLs (500,000 Units total) by mouth 3 (three) times daily. 60 mL 0  . pantoprazole (PROTONIX) 40 MG tablet Take 1 tablet (40 mg total) by mouth daily. 30 tablet 5  . simvastatin (ZOCOR) 20 MG tablet Take 1 tablet (20 mg total) by mouth daily. 30 tablet 5   No current facility-administered medications on file prior to visit.    Review of Systems  Constitutional: Positive for appetite change (decreased appetite. ) and fatigue. Negative for fever.  HENT: Positive for congestion and postnasal drip.   Respiratory: Positive for cough (increased  cough - productive.  increased chest congestion.  ) and shortness of breath.   Cardiovascular: Negative for chest pain and leg swelling.  Gastrointestinal: Positive for diarrhea (previous diarrhea.  better.  ). Negative for nausea, vomiting, abdominal pain and constipation.  Musculoskeletal: Negative for joint swelling.  Skin: Negative for rash.  Neurological: Positive for light-headedness (some minimal dizziness as outlined.   weakness.  increased fatigue.  ).       Objective:    Physical Exam  HENT:  Nose: Nose normal.  Mouth/Throat: Oropharynx is clear and moist.  Neck: Neck supple. No thyromegaly present.  Cardiovascular: Normal rate and regular rhythm.   Pulmonary/Chest:   Increased cough with expiration and forced expiration.  Increased congestion - clears with coughing.  Increased air movement.    Abdominal: Soft. Bowel sounds are normal. There is no tenderness.  Musculoskeletal: She exhibits no edema.  Lymphadenopathy:    She has no cervical adenopathy.    BP 120/80 mmHg  Pulse 84  Temp(Src) 99.1 F (37.3 C) (Oral)  Ht 5' 6.5" (1.689 m)  SpO2 99% Wt Readings from Last 3 Encounters:  03/11/14 181 lb 4 oz (82.214 kg)  03/04/14 178 lb (80.74 kg)  01/07/14 184 lb (83.462 kg)     Lab Results  Component Value Date   WBC 10.9* 03/08/2014   HGB 12.2 03/08/2014   HCT 38.7 03/08/2014   PLT 302.0 03/08/2014   GLUCOSE 143* 03/08/2014   CHOL 142 03/08/2014   TRIG 102.0 03/08/2014   HDL 34.40* 03/08/2014   LDLCALC 87 03/08/2014   ALT 18 03/08/2014   AST 27 03/08/2014   NA 139 03/08/2014   K 4.9 03/08/2014   CL 105 03/08/2014   CREATININE 1.4* 03/08/2014   BUN 27* 03/08/2014   CO2 25 03/08/2014   TSH 3.42 11/06/2013   HGBA1C 6.7* 03/08/2014   MICROALBUR 2.1* 11/06/2013       Assessment & Plan:   Problem List Items Addressed This Visit    Cough    With increased cough, congestion and sob.  Appears to have respiratory infection.  Treat with zpak as directed.  Prednisone taper as directed.  Increase advair to 500/50 bid.  Use the rescue inhaler if needed.  Mucinex in the am and robitussin in the evening.   Rest.  Fluids.  We discussed possible hospitalization.  She prefers to try the above.  If no improvement or if worsening symptoms, will require hospitalization.        Diabetes mellitus    Will need to monitor sugars while on prednisone.        Hypertension - Primary    Blood pressure controlled.  Follow.           Dale DurhamSCOTT, Josselyn Harkins, MD

## 2014-07-02 NOTE — Progress Notes (Signed)
Pre visit review using our clinic review tool, if applicable. No additional management support is needed unless otherwise documented below in the visit note. 

## 2014-07-04 ENCOUNTER — Ambulatory Visit: Payer: Medicare Other | Admitting: Internal Medicine

## 2014-07-04 ENCOUNTER — Encounter: Payer: Self-pay | Admitting: Internal Medicine

## 2014-07-04 NOTE — Assessment & Plan Note (Signed)
Blood pressure controlled.  Follow.    

## 2014-07-04 NOTE — Assessment & Plan Note (Signed)
With increased cough, congestion and sob.  Appears to have respiratory infection.  Treat with zpak as directed.  Prednisone taper as directed.  Increase advair to 500/50 bid.  Use the rescue inhaler if needed.  Mucinex in the am and robitussin in the evening.   Rest.  Fluids.  We discussed possible hospitalization.  She prefers to try the above.  If no improvement or if worsening symptoms, will require hospitalization.

## 2014-07-04 NOTE — Assessment & Plan Note (Signed)
Will need to monitor sugars while on prednisone. 

## 2014-07-10 ENCOUNTER — Other Ambulatory Visit (INDEPENDENT_AMBULATORY_CARE_PROVIDER_SITE_OTHER): Payer: Medicare Other

## 2014-07-10 ENCOUNTER — Encounter: Payer: Self-pay | Admitting: Internal Medicine

## 2014-07-10 DIAGNOSIS — E7800 Pure hypercholesterolemia, unspecified: Secondary | ICD-10-CM

## 2014-07-10 DIAGNOSIS — E119 Type 2 diabetes mellitus without complications: Secondary | ICD-10-CM | POA: Diagnosis not present

## 2014-07-10 DIAGNOSIS — I10 Essential (primary) hypertension: Secondary | ICD-10-CM | POA: Diagnosis not present

## 2014-07-10 LAB — LIPID PANEL
CHOL/HDL RATIO: 3
CHOLESTEROL: 140 mg/dL (ref 0–200)
HDL: 53 mg/dL (ref >39.00)
LDL CALC: 65 mg/dL (ref 0–99)
NonHDL: 87
Triglycerides: 110 mg/dL (ref 0.0–149.0)
VLDL: 22 mg/dL (ref 0.0–40.0)

## 2014-07-10 LAB — HEPATIC FUNCTION PANEL
ALK PHOS: 59 U/L (ref 39–117)
ALT: 37 U/L — AB (ref 0–35)
AST: 28 U/L (ref 0–37)
Albumin: 3.3 g/dL — ABNORMAL LOW (ref 3.5–5.2)
BILIRUBIN DIRECT: 0.1 mg/dL (ref 0.0–0.3)
Total Bilirubin: 0.5 mg/dL (ref 0.2–1.2)
Total Protein: 6 g/dL (ref 6.0–8.3)

## 2014-07-10 LAB — BASIC METABOLIC PANEL
BUN: 32 mg/dL — ABNORMAL HIGH (ref 6–23)
CO2: 28 mEq/L (ref 19–32)
Calcium: 9.7 mg/dL (ref 8.4–10.5)
Chloride: 104 mEq/L (ref 96–112)
Creatinine, Ser: 1.22 mg/dL — ABNORMAL HIGH (ref 0.40–1.20)
GFR: 44.45 mL/min — ABNORMAL LOW (ref >60.00)
Glucose, Bld: 121 mg/dL — ABNORMAL HIGH (ref 70–99)
Potassium: 4.8 mEq/L (ref 3.5–5.1)
Sodium: 141 mEq/L (ref 135–145)

## 2014-07-10 LAB — HEMOGLOBIN A1C: Hgb A1c MFr Bld: 7.3 % — ABNORMAL HIGH (ref 4.6–6.5)

## 2014-07-11 ENCOUNTER — Other Ambulatory Visit: Payer: Self-pay | Admitting: *Deleted

## 2014-07-11 MED ORDER — MONTELUKAST SODIUM 10 MG PO TABS: 10.0000 mg | ORAL_TABLET | Freq: Every day | ORAL | Status: DC

## 2014-07-12 ENCOUNTER — Ambulatory Visit (INDEPENDENT_AMBULATORY_CARE_PROVIDER_SITE_OTHER): Payer: Medicare Other | Admitting: Internal Medicine

## 2014-07-12 ENCOUNTER — Encounter: Payer: Self-pay | Admitting: Internal Medicine

## 2014-07-12 VITALS — BP 110/60 | HR 90 | Temp 98.1°F | Ht 66.5 in | Wt 178.5 lb

## 2014-07-12 DIAGNOSIS — J449 Chronic obstructive pulmonary disease, unspecified: Secondary | ICD-10-CM

## 2014-07-12 DIAGNOSIS — E669 Obesity, unspecified: Secondary | ICD-10-CM | POA: Diagnosis not present

## 2014-07-12 DIAGNOSIS — I10 Essential (primary) hypertension: Secondary | ICD-10-CM | POA: Diagnosis not present

## 2014-07-12 DIAGNOSIS — E7800 Pure hypercholesterolemia, unspecified: Secondary | ICD-10-CM

## 2014-07-12 DIAGNOSIS — E538 Deficiency of other specified B group vitamins: Secondary | ICD-10-CM

## 2014-07-12 DIAGNOSIS — R600 Localized edema: Secondary | ICD-10-CM | POA: Diagnosis not present

## 2014-07-12 DIAGNOSIS — K219 Gastro-esophageal reflux disease without esophagitis: Secondary | ICD-10-CM | POA: Diagnosis not present

## 2014-07-12 DIAGNOSIS — R928 Other abnormal and inconclusive findings on diagnostic imaging of breast: Secondary | ICD-10-CM

## 2014-07-12 DIAGNOSIS — I739 Peripheral vascular disease, unspecified: Secondary | ICD-10-CM | POA: Diagnosis not present

## 2014-07-12 DIAGNOSIS — E119 Type 2 diabetes mellitus without complications: Secondary | ICD-10-CM

## 2014-07-12 NOTE — Progress Notes (Signed)
Pre visit review using our clinic review tool, if applicable. No additional management support is needed unless otherwise documented below in the visit note. 

## 2014-07-15 ENCOUNTER — Encounter: Payer: Self-pay | Admitting: Internal Medicine

## 2014-07-15 NOTE — Assessment & Plan Note (Signed)
Continue B12 injections.   

## 2014-07-15 NOTE — Assessment & Plan Note (Signed)
Discussed diet and exercise 

## 2014-07-15 NOTE — Assessment & Plan Note (Signed)
Low cholesterol diet and exercise.  On simvastatin.  Follow lipid panel and liver function tests.   

## 2014-07-15 NOTE — Assessment & Plan Note (Signed)
Blood pressure doing well.  Same medication regimen.  Follow pressures.  Recent Cr improved - 1.22.

## 2014-07-15 NOTE — Assessment & Plan Note (Signed)
Recent treatment for respiration.  Breathing better.  Has completed abx.  Finishing prednisone.  Follow.

## 2014-07-15 NOTE — Assessment & Plan Note (Signed)
Better with compression hose.  Follow.

## 2014-07-15 NOTE — Progress Notes (Signed)
[name], female   DOB: 1927/01/18, 81 y.o.   MRN: [id number]   Subjective:    [name], female    DOB: Oct 14, 1926, 81 y.o.   MRN: [id number]  HPI  Patient here for a scheduled follow up.  She is accompanied by her daughter.  History obtained from both of them.  She is doing much better.  Previously treated her for a respiratory infection.  See last note for details.  She has completed her abx.  Finishing her abx.  Eating and drinking well.  No acid reflux.  Breathing better.  Bowels stable.  No urinary issues.  Recent a1c 7.3.      Past Medical History  Diagnosis Date  . Hypertension   . Hypercholesterolemia   . Peripheral vascular disease   . GERD (gastroesophageal reflux disease)     hiatal hernia  . History of frequent urinary tract infections   . Diabetes mellitus   . History of colon polyps   . Hematuria     followed by urology  . Gout   . Humeral fracture 9/09    comminuted impacted proximal    Current Outpatient Prescriptions on File Prior to Visit  Medication Sig Dispense Refill  . albuterol (PROVENTIL HFA;VENTOLIN HFA) 108 (90 BASE) MCG/ACT inhaler Inhale 2 puffs into the lungs every 6 (six) hours as needed for wheezing or shortness of breath. 1 Inhaler 1  . allopurinol (ZYLOPRIM) 100 MG tablet Take 1 tablet (100 mg total) by mouth daily. 30 tablet 5  . Calcium Carbonate-Vitamin D (CALCIUM 600+D) 600-400 MG-UNIT per tablet Take 1 tablet by mouth daily.     . cetirizine (ZYRTEC) 10 MG tablet Take 10 mg by mouth daily.    . Cholecalciferol (VITAMIN D-3) 1000 UNITS CAPS Take 1 capsule by mouth daily.    . Cranberry-Cholecalciferol 4200-500 MG-UNIT CAPS Take by mouth.    Marland Kitchen FLUoxetine (PROZAC) 20 MG capsule Take 1 capsule (20 mg total) by mouth daily. 30 capsule 2  . Fluticasone-Salmeterol (ADVAIR DISKUS) 250-50 MCG/DOSE AEPB Inhale 1 puff into the lungs 2 (two) times daily. 1 each 5  . Fluticasone-Salmeterol (ADVAIR DISKUS) 500-50 MCG/DOSE AEPB  Inhale 1 puff into the lungs 2 (two) times daily. 60 each 0  . levothyroxine (SYNTHROID) 50 MCG tablet Take 1 tablet (50 mcg total) by mouth daily. 30 tablet 5  . losartan (COZAAR) 100 MG tablet Take 1 tablet (100 mg total) by mouth daily. 30 tablet 5  . methenamine (HIPREX) 1 G tablet Take 1 tablet (1 g total) by mouth 2 (two) times daily with a meal. (Patient taking differently: Take 1 g by mouth daily. ) 60 tablet 3  . mirabegron ER (MYRBETRIQ) 25 MG TB24 tablet Take 1 tablet (25 mg total) by mouth daily. 30 tablet 5  . montelukast (SINGULAIR) 10 MG tablet Take 1 tablet (10 mg total) by mouth at bedtime. 30 tablet 5  . nystatin (MYCOSTATIN) 100000 UNIT/ML suspension Take 5 mLs (500,000 Units total) by mouth 3 (three) times daily. (Patient taking differently: Take 5 mLs by mouth as needed. ) 60 mL 0  . pantoprazole (PROTONIX) 40 MG tablet Take 1 tablet (40 mg total) by mouth daily. 30 tablet 5  . predniSONE (DELTASONE) 10 MG tablet Take 6 tablets x 1 day and then decrease by 1/2 tablet per day until down to zero mg. 39 tablet 0  . simvastatin (ZOCOR) 20 MG tablet Take 1 tablet (20 mg total) by mouth  daily. 30 tablet 5   No current facility-administered medications on file prior to visit.    Review of Systems  Constitutional: Negative for appetite change and unexpected weight change.  HENT: Negative for congestion, sinus pressure and sore throat.   Respiratory: Positive for cough (is better.  ). Negative for chest tightness and shortness of breath.   Cardiovascular: Negative for chest pain, palpitations and leg swelling.  Gastrointestinal: Negative for nausea, vomiting, abdominal pain and diarrhea.  Genitourinary: Negative for dysuria and difficulty urinating.  Musculoskeletal: Negative for back pain and joint swelling.  Skin: Negative for color change and rash.  Neurological: Negative for dizziness, light-headedness and headaches.       Objective:     Blood pressure recheck:   114/68  Physical Exam  Constitutional: She appears well-developed and well-nourished. No distress.  HENT:  Nose: Nose normal.  Mouth/Throat: Oropharynx is clear and moist.  Neck: Neck supple. No thyromegaly present.  Cardiovascular: Normal rate and regular rhythm.   Pulmonary/Chest: Breath sounds normal. No respiratory distress.  Abdominal: Soft. Bowel sounds are normal. There is no tenderness.  Musculoskeletal: She exhibits no edema or tenderness.  Lymphadenopathy:    She has no cervical adenopathy.  Skin: No rash noted. No erythema.    BP 110/60 mmHg  Pulse 90  Temp(Src) 98.1 F (36.7 C) (Oral)  Ht 5' 6.5" (1.689 m)  Wt 178 lb 8 oz (80.967 kg)  BMI 28.38 kg/m2  SpO2 96% Wt Readings from Last 3 Encounters:  07/12/14 178 lb 8 oz (80.967 kg)  03/11/14 181 lb 4 oz (82.214 kg)  03/04/14 178 lb (80.74 kg)     Lab Results  Component Value Date   WBC 10.9* 03/08/2014   HGB 12.2 03/08/2014   HCT 38.7 03/08/2014   PLT 302.0 03/08/2014   GLUCOSE 121* 07/10/2014   CHOL 140 07/10/2014   TRIG 110.0 07/10/2014   HDL 53.00 07/10/2014   LDLCALC 65 07/10/2014   ALT 37* 07/10/2014   AST 28 07/10/2014   NA 141 07/10/2014   K 4.8 07/10/2014   CL 104 07/10/2014   CREATININE 1.22* 07/10/2014   BUN 32* 07/10/2014   CO2 28 07/10/2014   TSH 3.42 11/06/2013   HGBA1C 7.3* 07/10/2014   MICROALBUR 2.1* 11/06/2013       Assessment & Plan:   Problem List Items Addressed This Visit    Abnormal mammogram    See mammogram results in overview.  Due f/u mammogram and ultrasound this month.  Schedule f/u mammogram.        Relevant Orders   MM Digital Diagnostic Unilat R   US BREAST COMPLETE UNI RIGHT INC AXILLA   Chronic obstructive airway disease with asthma    Recent treatment for respiration.  Breathing better.  Has completed abx.  Finishing prednisone.  Follow.        Diabetes mellitus    a1c 7.2.  Slightly increased from last check.  Low carb diet.  Check and record sugars.   Will follow.       Relevant Orders   Hemoglobin A1c   Basic metabolic panel   TSH   Microalbumin / creatinine urine ratio   GERD (gastroesophageal reflux disease)    Controlled.        Relevant Orders   CBC with Differential/Platelet   Hypercholesterolemia    Low cholesterol diet and exercise.  On simvastatin.  Follow lipid panel and liver function tests.        Relevant Orders  Lipid panel   Hepatic function panel   Hypertension - Primary    Blood pressure doing well.  Same medication regimen.  Follow pressures.  Recent Cr improved - 1.22.        Relevant Orders   Basic metabolic panel   Lower extremity edema    Better with compression hose.  Follow.       Obesity (BMI 30.0-34.9)    Discussed diet and exercise.        Peripheral vascular disease    Asymptomatic.  Varying pressures in each arm.  Follow.        Vitamin B12 deficiency    Continue B12 injections.          I spent 25 minutes with the patient and more than 50% of the time was spent in consultation regarding the above.     Dale Anahola, MD

## 2014-07-15 NOTE — Assessment & Plan Note (Signed)
Controlled.  

## 2014-07-15 NOTE — Assessment & Plan Note (Signed)
Asymptomatic.  Varying pressures in each arm.  Follow.

## 2014-07-15 NOTE — Assessment & Plan Note (Signed)
See mammogram results in overview.  Due f/u mammogram and ultrasound this month.  Schedule f/u mammogram.

## 2014-07-15 NOTE — Assessment & Plan Note (Signed)
a1c 7.2.  Slightly increased from last check.  Low carb diet.  Check and record sugars.  Will follow.

## 2014-07-17 DIAGNOSIS — H3531 Nonexudative age-related macular degeneration: Secondary | ICD-10-CM | POA: Diagnosis not present

## 2014-07-29 ENCOUNTER — Encounter: Payer: Self-pay | Admitting: Internal Medicine

## 2014-07-30 ENCOUNTER — Ambulatory Visit: Payer: Self-pay | Admitting: Internal Medicine

## 2014-07-30 DIAGNOSIS — N63 Unspecified lump in breast: Secondary | ICD-10-CM | POA: Diagnosis not present

## 2014-07-30 DIAGNOSIS — R928 Other abnormal and inconclusive findings on diagnostic imaging of breast: Secondary | ICD-10-CM | POA: Diagnosis not present

## 2014-07-30 LAB — HM MAMMOGRAPHY

## 2014-08-01 ENCOUNTER — Ambulatory Visit (INDEPENDENT_AMBULATORY_CARE_PROVIDER_SITE_OTHER): Payer: Medicare Other | Admitting: *Deleted

## 2014-08-01 DIAGNOSIS — E538 Deficiency of other specified B group vitamins: Secondary | ICD-10-CM

## 2014-08-01 MED ORDER — CYANOCOBALAMIN 1000 MCG/ML IJ SOLN
1000.0000 ug | Freq: Once | INTRAMUSCULAR | Status: AC
  Administered 2014-08-01: 1000 ug via INTRAMUSCULAR

## 2014-08-08 ENCOUNTER — Other Ambulatory Visit: Payer: Self-pay | Admitting: *Deleted

## 2014-08-08 MED ORDER — SIMVASTATIN 20 MG PO TABS: 20.0000 mg | ORAL_TABLET | Freq: Every day | ORAL | Status: DC

## 2014-08-14 ENCOUNTER — Ambulatory Visit: Payer: Medicare Other | Admitting: Pulmonary Disease

## 2014-08-22 ENCOUNTER — Other Ambulatory Visit: Payer: Self-pay | Admitting: *Deleted

## 2014-08-22 NOTE — Telephone Encounter (Signed)
Ok refill? 

## 2014-08-23 NOTE — Telephone Encounter (Signed)
Spoke to pt, takes once daily. It was refilled by our office last. Pt states she doesn't remember who Rx/d it orginally, is unsure if she still sees that provider or not.

## 2014-08-23 NOTE — Telephone Encounter (Signed)
I have not been prescribing this medication.  Need to get filled from md prescribing.  Also, for our records, need to clarify how taking.  (one a day or bid?)

## 2014-08-24 MED ORDER — METHENAMINE HIPPURATE 1 G PO TABS: 1.0000 g | ORAL_TABLET | Freq: Two times a day (BID) | ORAL | Status: DC

## 2014-08-24 NOTE — Telephone Encounter (Signed)
Order placed for hiprex #60 with no refills.  Since has been taking regularly.  Will follow up at appt.

## 2014-09-03 ENCOUNTER — Ambulatory Visit (INDEPENDENT_AMBULATORY_CARE_PROVIDER_SITE_OTHER): Payer: Medicare Other | Admitting: *Deleted

## 2014-09-03 DIAGNOSIS — E538 Deficiency of other specified B group vitamins: Secondary | ICD-10-CM | POA: Diagnosis not present

## 2014-09-03 MED ORDER — CYANOCOBALAMIN 1000 MCG/ML IJ SOLN
1000.0000 ug | Freq: Once | INTRAMUSCULAR | Status: AC
  Administered 2014-09-03: 1000 ug via INTRAMUSCULAR

## 2014-09-09 ENCOUNTER — Other Ambulatory Visit: Payer: Self-pay | Admitting: *Deleted

## 2014-09-09 MED ORDER — FLUOXETINE HCL 20 MG PO CAPS: 20.0000 mg | ORAL_CAPSULE | Freq: Every day | ORAL | Status: DC

## 2014-09-09 MED ORDER — PANTOPRAZOLE SODIUM 40 MG PO TBEC: 40.0000 mg | DELAYED_RELEASE_TABLET | Freq: Every day | ORAL | Status: DC

## 2014-09-09 NOTE — Addendum Note (Signed)
Addended by: Chandra BatchNIXON, Margarie Mcguirt E on: 09/09/2014 04:37 PM   Modules accepted: Orders

## 2014-09-10 NOTE — Op Note (Signed)
PATIENT NAME:  Diamond Newman, Diamond Newman MR#:  [id number] DATE OF BIRTH:  [birth year]  DATE OF PROCEDURE:  01/19/2012  PREOPERATIVE DIAGNOSIS: Torn medial meniscus, right knee, along with medial tibial plateau insufficiency fracture.   POSTOPERATIVE DIAGNOSIS: Torn medial meniscus, right knee, along with medial tibial plateau insufficiency fracture.   OPERATION: Arthroscopic partial medial meniscectomy, right knee, plus subchondroplasty right medial tibial plateau   SURGEON: Alda BertholdHarold B. Yoandri Congrove, Jr., MD   ANESTHESIA: General.   HISTORY: The patient had a long history of right knee pain. Despite anti-inflammatory medications and Synvisc injections, she had continued having discomfort. Plain films had not revealed any significant abnormality, but an MRI revealed a torn medial meniscus plus a medial tibial plateau insufficiency fracture. The patient was ultimately brought in for surgery.   PROCEDURE: The patient was taken the Operating Room where satisfactory general anesthesia was achieved. A tourniquet and legholder were applied to the right thigh. The left lower extremity was supported with a well legholder. The right knee was prepped and draped in the usual fashion for a knee procedure. Incidentally, the patient was given 600 mg of clindamycin IV prior to the start of the procedure.   An inflow cannula was introduced superomedially. The right knee joint was distended with lactated Ringer's. We used the Mitek fluid pump to facilitate joint distention. The scope was introduced through an inferolateral puncture wound and a probe through an inferomedial puncture wound. Inspection of the medial compartment revealed the patient had a little fibrillation of the lateral aspect of her medial femoral condyle. There was a complex degenerative type tear of the posterior horn of the medial meniscus. I went ahead and resected the torn meniscus using a combination of basket biters and a motorized resector. The rim was contoured  with an angled ArthroCare wand. A Turbo Whisker was used to debride the fibrillated lateral aspect of her medial femoral condyle.   Inspection of the intercondylar notch revealed that the cruciates were intact. Inspection of the lateral compartment revealed a little fraying of the inner border of the lateral meniscus which I debrided with a Turbo Whisker. I felt the chondral surfaces laterally were smooth. The trochlear groove was smooth. The patella was observed primarily from the superomedial portal. Indeed, the retropatellar surface was fairly smooth and the patella seemed to track well.   The fluid in the joint was decompressed through the cannulas at this time and the cannulas were removed. Then, using the FluoroScan, I was able to find the appropriate insertion point for the trocar for our subchondroplasty injection. With the aid of AP and lateral fluoroscopic views, I was able to insert the trocar into the anterior aspect of the medial tibial plateau which was the insufficiency fracture site. Once the cannula was inserted, the obturator was removed and then I injected 5 mL of calcium phosphate paste through the cannula into the area of the insufficiency fracture. The satisfactory distribution of the calcium phosphate was appreciated on the AP and lateral fluoroscopic views. I then removed the insertional cannula, and then the scope was reintroduced into the joint. The joint was distended again with lactated Ringer's. There was some calcium phosphate in the medial compartment which I removed with a resector. Then the irrigating fluid was decompressed from the joint. The puncture wounds were closed with 3-0 nylon in vertical mattress fashion. I did inject each puncture wound with several mL of 0.5% Marcaine without epinephrine. Betadine was applied to the wounds followed by four TENS pads and a  sterile dressing. An ice pack was applied over the knee. The patient was then awakened and transferred to her  stretcher bed. She was taken to the recovery room in satisfactory condition. Blood loss was negligible.   The tourniquet, incidentally, was not inflated during the course of the procedure.  ____________________________ Alda Berthold., MD hbk:cbb D: 01/19/2012 11:22:50 ET T: 01/19/2012 12:48:39 ET JOB#: [id number]  cc: Alda Berthold., MD, <Dictator> Randon Goldsmith, JR MD ELECTRONICALLY SIGNED 02/07/2012 17:26

## 2014-09-11 ENCOUNTER — Ambulatory Visit (INDEPENDENT_AMBULATORY_CARE_PROVIDER_SITE_OTHER): Payer: Medicare Other | Admitting: Pulmonary Disease

## 2014-09-11 ENCOUNTER — Encounter: Payer: Self-pay | Admitting: Pulmonary Disease

## 2014-09-11 VITALS — BP 128/68 | HR 80 | Ht 66.5 in | Wt 182.0 lb

## 2014-09-11 DIAGNOSIS — J449 Chronic obstructive pulmonary disease, unspecified: Secondary | ICD-10-CM | POA: Diagnosis not present

## 2014-09-11 NOTE — Progress Notes (Signed)
Subjective:    [name], female    DOB: Jul 05, 1926, 81 y.o.   MRN: [id number]  Synopsis: First referred to the [facility] in 2015 for chronic obstructive asthma. She never smoked.  HPI Chief Complaint  Patient presents with  . Follow-up    Pt c/o prod cough with white mucus, no other breathing complaints at this time. Pt finished pulm rehab in January, has been doing Silver sneakers since.     She had her Advair increased over the winter timet o 500/50 and she has been doing well.  She was having some dyspnea and mucus production and this medication helped. She completed pulmonary rehab around Christmas time.  She stared the graduate class on April and she says it is doing well.  She doesn't feel as strong now as she did in 2015.  She has tolerated it increased dose of Advair without difficulty.   She uses albuterol only when she is sick.  She estimates that in the last 2-4 weeks she has only used it about 4-5 times.  Past Medical History  Diagnosis Date  . Hypertension   . Hypercholesterolemia   . Peripheral vascular disease   . GERD (gastroesophageal reflux disease)     hiatal hernia  . History of frequent urinary tract infections   . Diabetes mellitus   . History of colon polyps   . Hematuria     followed by urology  . Gout   . Humeral fracture 9/09    comminuted impacted proximal    Review of Systems  Constitutional: Negative for fever, chills and fatigue.  HENT: Negative for postnasal drip, rhinorrhea and sinus pressure.   Respiratory: Negative for cough, shortness of breath and wheezing.   Cardiovascular: Negative for chest pain, palpitations and leg swelling.       Objective   Physical Exam Filed Vitals:   09/11/14 1647  BP: 128/68  Pulse: 80  Height: 5' 6.5" (1.689 m)  Weight: 182 lb (82.555 kg)  SpO2: 97%    RA  Gen: well appearing HENT: OP clear, TM's clear, neck supple PULM: Wheezing that clears with a few deep  breaths, normal respiratory effort, good air movement CV: RRR, no mgr, trace edema GI: BS+, soft, nontender Derm: no cyanosis or rash Psyche: normal mood and affect   11/10/2013 chest x-ray normal  06/2011 PFT [facility]> Ratio 70%, Small Airways disease on loop, FEV1 1.63L (93% pred), FEF 25-75% 0.90 (68% pred), TLC 4.81 L (98% pred), DLCO 9.5 (44% pred) 07/2013 PFT > Loop> SMALL AIRWAYS DISEASE; Ratio 70%, FEV1 1.49L (81% pred, 1% change with BD), FEF 25-75% 0.83 (42% pred), TLC 4.59 L (90% pred), DLCO 10.8 (50% pred) 02/2014 6MW 490 feet, 95% RA     Assessment & Plan:   Chronic obstructive airway disease with asthma She has been doing well in the last several weeks despite the pollen being out recently. Today I advised that she continue on the 500/50 dose of Advair indefinitely. I encouraged her to continue participating in pulmonary rehabilitation.  Plan: -continue Advair at current dose -Continue pulmonary rehabilitation -Use albuterol as needed -Follow-up in 6 months or sooner if needed     Updated Medication List Outpatient Encounter Prescriptions as of 09/11/2014  Medication Sig  . albuterol (PROVENTIL HFA;VENTOLIN HFA) 108 (90 BASE) MCG/ACT inhaler Inhale 2 puffs into the lungs every 6 (six) hours as needed for wheezing or shortness of breath.  . allopurinol (ZYLOPRIM) 100 MG tablet Take  1 tablet (100 mg total) by mouth daily.  . Calcium Carbonate-Vitamin D (CALCIUM 600+D) 600-400 MG-UNIT per tablet Take 1 tablet by mouth daily.   . cetirizine (ZYRTEC) 10 MG tablet Take 10 mg by mouth daily.  . Cholecalciferol (VITAMIN D-3) 1000 UNITS CAPS Take 1 capsule by mouth daily.  . Cranberry-Cholecalciferol 4200-500 MG-UNIT CAPS Take by mouth.  Marland Kitchen FLUoxetine (PROZAC) 20 MG capsule Take 1 capsule (20 mg total) by mouth daily.  . Fluticasone-Salmeterol (ADVAIR DISKUS) 500-50 MCG/DOSE AEPB Inhale 1 puff into the lungs 2 (two) times daily.  Marland Kitchen levothyroxine (SYNTHROID) 50 MCG tablet Take  1 tablet (50 mcg total) by mouth daily.  Marland Kitchen losartan (COZAAR) 100 MG tablet Take 1 tablet (100 mg total) by mouth daily.  . methenamine (HIPREX) 1 G tablet Take 1 tablet (1 g total) by mouth 2 (two) times daily with a meal.  . mirabegron ER (MYRBETRIQ) 25 MG TB24 tablet Take 1 tablet (25 mg total) by mouth daily.  . montelukast (SINGULAIR) 10 MG tablet Take 1 tablet (10 mg total) by mouth at bedtime.  . pantoprazole (PROTONIX) 40 MG tablet Take 1 tablet (40 mg total) by mouth daily.  . simvastatin (ZOCOR) 20 MG tablet Take 1 tablet (20 mg total) by mouth daily.  . [DISCONTINUED] Fluticasone-Salmeterol (ADVAIR DISKUS) 250-50 MCG/DOSE AEPB Inhale 1 puff into the lungs 2 (two) times daily. (Patient not taking: Reported on 09/11/2014)  . [DISCONTINUED] nystatin (MYCOSTATIN) 100000 UNIT/ML suspension Take 5 mLs (500,000 Units total) by mouth 3 (three) times daily. (Patient not taking: Reported on 09/11/2014)  . [DISCONTINUED] predniSONE (DELTASONE) 10 MG tablet Take 6 tablets x 1 day and then decrease by 1/2 tablet per day until down to zero mg. (Patient not taking: Reported on 09/11/2014)

## 2014-09-11 NOTE — Assessment & Plan Note (Signed)
She has been doing well in the last several weeks despite the pollen being out recently. Today I advised that she continue on the 500/50 dose of Advair indefinitely. I encouraged her to continue participating in pulmonary rehabilitation.  Plan: -continue Advair at current dose -Continue pulmonary rehabilitation -Use albuterol as needed -Follow-up in 6 months or sooner if needed

## 2014-09-11 NOTE — Patient Instructions (Signed)
Continue the Advair as you are doing Continue Zyrtec as you are doing Keep exercising and stay active We will see you back in 6 months or sooner if needed

## 2014-09-14 NOTE — H&P (Signed)
PATIENT NAME:  Diamond Newman, Diamond Newman MR#:  [id number] DATE OF BIRTH:  11/03/1926  DATE OF ADMISSION:  11/10/2013  PRIMARY CARE PHYSICIAN: Dr. Dale Reynolds  REFERRING PHYSICIAN: Dr. Carollee Massed  CHIEF COMPLAINT: Referred from urgent care for persistently elevated white blood cell count, weakness.   HISTORY OF PRESENT ILLNESS: The patient is a pleasant, 81 year old Caucasian female who was just discharged from the hospital on the 11th. Of note, patient came in on the 6th, was found to have sepsis with UTI and pneumonia. She was discharged on a prednisone taper for COPD exacerbation and pneumonia, as well as Ceftin. Her last dose of Ceftin is supposed to be tonight. She finished a prednisone taper last Wednesday, and azithromycin after 2 tablets post- hospitalization. She was doing good, per daughter, who is also in the room. She has had no significant fevers or chills, but had gone to her PCPs office for routine blood work, and was noted to have elevated white count on Tuesday of about 20,000. She was told to come back for another CBC check, and she went on Thursday. Of note, patient also had a fall in the interim, which was mechanical, as she tripped over her pajamas as she was trying to take those off. On Thursday, she had gone to the PCP's office. She was also sent for a CAT scan of the head after the fall, which is negative. Then a CBC was drawn again on Thursday and, per daughter, she has not checked that, but she was told to follow up with a doctor, and she went to urgent care today. At [facility], she was found to be hypotensive while standing up. She denies having any orthostatic dizziness, but her pressures dropped from 90/60 to 70/50 while standing. She was referred here. Here, she has mild elevated BUN and creatinine from prior, as well as a white count of 20,000, and hospitalist services were contacted for further evaluation and management.   PAST MEDICAL HISTORY:  1.  Recent hospitalization for  sepsis, UTI and pneumonia, with COPD exacerbation.  2.  GERD.  3.  History of multiple UTIs in the past.  4.  Bladder surgery.  5.  Hyperlipidemia.  6.  Hypertension.  7.  PVD.  8.  Hypothyroidism.  9.  Urinary incontinence.   SOCIAL HISTORY: Lives by herself. No alcohol or drug use. Positive for secondhand smoke from husband and father.   FAMILY HISTORY: COPD stroke, as well as MI and colitis in mom.   ALLERGIES: ACE INHIBITOR, AMOXICILLIN, CIPRO, CODEINE, SULFA, TETRACYCLINE, VIBRAMYCIN.  HOME MEDICATIONS: Advair 250/50, 1 puff 2 times a day, albuterol p.r.n., allopurinol 100 mg daily, calcium plus D 1 tab daily, Ceftin 500 mg 2 times a day, last dose tonight, cetirizine 10 mg daily, cholecalciferol 1 cap once a day, fluoxetine 20 mg daily, losartan 50 mg daily, methenamine hippurate 1 gram 2 times a day with meals, Mirabegron 25 mg extended release once a day, Protonix 40 mg daily, simvastatin 20 mg daily, Singulair 10 mg daily, Synthroid 50 mcg daily, vitamin D 3000 units daily.   REVIEW OF SYSTEMS:    CONSTITUTIONAL: Weight is steady. She feels a little weak. No fevers, chills, rigors, or shakes.  EYES: No blurry vision or double vision.  ENT: No tinnitus, hearing loss. A little bit of rhinorrhea.  RESPIRATORY: Has improving cough. She has a little bit of a whitish sputum, whereas before during her pneumonia she had grayish to yellow sputum. No shortness of breath. No  pleuritic chest pain. No wheezing.  CARDIOVASCULAR: No chest pain, palpitations, or swelling in the legs.  GASTROINTESTINAL: No nausea, vomiting, diarrhea, abdominal pain, black or bloody or tarry stools.  GENITOURINARY: Still has a little bit of dysuria. It feels like the last day or 2, she has been peeing less. Has history of incontinence.  ENDOCRINE: Denies polyuria, nocturia. HEMATOLOGIC/LYMPHATIC:  Denies anemia or easy bruising.  SKIN: No rashes, besides the rashes she has had after a blood draw. During the  hospital, those are more bruising.  MUSCULOSKELETAL: Has some arthritis , some in knees and legs.   NEUROLOGIC: Denies focal weakness or numbness.  PSYCHIATRIC: Denies anxiety or insomnia.   PHYSICAL EXAMINATION: VITAL SIGNS: Temperature on arrival 97.8, pulse rate 102, respiratory rate 24, blood pressure 106/58, O2 sat 94% on room air.  GENERAL: The patient is an obese female lying in bed. Elderly, no obvious distress, talking in full sentences.  HEENT: Normocephalic, atraumatic. A little bit of ecchymosis right forehead area after the fall. No significant hematoma. Pupils are equal and reactive. Moist mucous membranes.  NECK: Supple. No thyroid tenderness. No cervical lymphadenopathy.  CARDIOVASCULAR: S1, S2 regular. No significant murmurs appreciated.  LUNGS: Clear to auscultation without wheezing, rhonchi, or rales.  ABDOMEN: Soft, nontender, nondistended. Positive bowel sounds in all quadrants.  EXTREMITIES: No pitting edema.  SKIN:  Has some ecchymosis upper extremities, no obvious rashes.  NEUROLOGIC: Cranial nerves II through XII grossly intact. Strength is 5/5 all extremities. Sensation is intact to light touch.  PSYCHIATRIC: Awake, alert, oriented x 3.   LABS AND IMAGING: UA, 1+ leukocyte esterase, 19 WBC, no bacteria, whereas before had 3+ leukocyte esterase, 97 WBC, and 3+ bacteria. Lactic acid is a little elevated at 1.7. Previous urine cultures had shown sensitive E. coli. Blood cultures from June 6 were negative. They have been redrawn. White count 20.5 thousand, hemoglobin 11.5, platelets are 347. BUN 29, creatinine 1.51, sodium 133, potassium of 5, chloride 100. X-ray of the chest, PA and lateral:  No active cardiopulmonary disease.   ASSESSMENT AND PLAN: We have an 81 year old female with recent discharge for sepsis, urinary tract infection, pneumonia, and chronic obstructive pulmonary disease flare, with a prednisone taper, Ceftin and azithromycin, who comes back in with  persistently elevated white count fall recently as well as hypotension today at the urgent care center. The patient has a white count of 20,000 which has been about this range in the last few days. Of note, she was on prednisone taper, which she finished the middle of last week, on Wednesday. She has no fever, but has hypotension, and has some lactic acidosis with elevation of BUN/creatinine, likely acute on chronic kidney injury. She denies having any orthostatic dizziness, at the urgent care center her pressures dropped from 90/60 to 70/50. At this point, she will be admitted to the hospital and started on cefepime and vancomycin pharmacy protocol, and blood and urine cultures will be resent. She appears to have sepsis per criteria with tachycardia, leukocytosis, a little bit of dysuria, still with a positive UA. However, she has been on treatment for that. Previous urine cultures were sensitive E. coli, and she was discharged on Ceftin, which appears to be an adequate antibiotic. Furthermore, the pneumonia per recent x-ray from today has resolved, and she has no significant hypoxemia, and her cough is better. Nonetheless, with her symptoms of hypotension, it is prudent to start broad spectrum and taper down if the cultures are negative.   She also  does have an acute likely chronic kidney injury. Of note, her creatinine was 1.23 on June 11, and today it is 1.51, with elevation of BUN from 18 on June 7 to 29 today. She states she has been having decreased urination, and this could be secondary to orthostatic hypotension. Would hold losartan, start the patient on some IV fluids. The patient also has mild acute hyponatremia, with sodium of 133, and she would benefit from normal saline. Would follow with the cultures, resume her Advair, Singulair, and continue her thyroid medicine. She does appear to have elevated blood sugars, and I would check a hemoglobin A1c. It is known she has been on steroids recently.    The patient is FULL CODE. Total time spent is 50 minutes.   ____________________________ Krystal Eaton, MD sa:mr D: 11/10/2013 17:53:03 ET T: 11/10/2013 18:29:39 ET JOB#: [id number]  cc: Krystal Eaton, MD, <Dictator> scott, Theola Sequin, MD  Krystal Eaton MD ELECTRONICALLY SIGNED 11/20/2013 18:16

## 2014-09-14 NOTE — Discharge Summary (Signed)
Dates of Admission and Diagnosis:  Date of Admission 10-Nov-2013   Date of Discharge 12-Nov-2013   Admitting Diagnosis dehydration   Final Diagnosis Dehydration Acute renal failure- resolving Hypotnsion- resolved Raised WBC- likley due to steroids and stress of dehydration Hyperlipidemia Hypothyroidism    Chief Complaint/History of Present Illness 81 year old Caucasian female who was just discharged from the hospital on the 11th. Of note, patient came in on the 6th, was found to have sepsis with UTI and pneumonia. She was discharged on a prednisone taper for COPD exacerbation and pneumonia, as well as Ceftin. Her last dose of Ceftin is supposed to be tonight. She finished a prednisone taper last Wednesday, and azithromycin after 2 tablets post- hospitalization. She was doing good, per daughter, who is also in the room. She has had no significant fevers or chills, but had gone to her PCPs office for routine blood work, and was noted to have elevated white count on Tuesday of about 20,000. She was told to come back for another CBC check, and she went on Thursday. Of note, patient also had a fall in the interim, which was mechanical, as she tripped over her pajamas as she was trying to take those off. On Thursday, she had gone to the PCP???s office. She was also sent for a CAT scan of the head after the fall, which is negative. Then a CBC was drawn again on Thursday and, per daughter, she has not checked that, but she was told to follow up with a doctor, and she went to urgent care today. At [facility], she was found to be hypotensive while standing up. She denies having any orthostatic dizziness, but her pressures dropped from 90/60 to 70/50 while standing. She was referred here. Here, she has mild elevated BUN and creatinine from prior, as well as a white count of 20,000, and hospitalist services were contacted for further evaluation and management.   Allergies:  Sulfa drugs: Rash  Ace  Inhibitors: Cough  Cipro: Rash  Tetracycline: Rash  Vibramycin: Rash  Codeine: Unknown  Amoxicillin: Unknown  Hepatic:  20-Jun-15 15:26   Bilirubin, Total 0.4  Alkaline Phosphatase 70 (45-117 NOTE: New Reference Range 04/13/13)  SGPT (ALT) 26  SGOT (AST) 16  Total Protein, Serum  5.8  Albumin, Serum  2.6  Routine Micro:  20-Jun-15 15:26   Micro Text Report URINE CULTURE   ORGANISM 1                -   COMMENT                   MIXED BACTERIAL ORGANISMS   COMMENT                   RESULTS SUGGESTIVE OF CONTAMINATION   ANTIBIOTIC                       Culture Comment MIXED BACTERIAL ORGANISMS  Organism Name GRAM NEGATIVE ROD  Organism Quantity >100,000 CFU/ML  Specimen Source CLEAN CATCH  Organism 1 -  Culture Comment . RESULTS SUGGESTIVE OF CONTAMINATION  Result(s) reported on 12 Nov 2013 at 11:26AM.    16:47   Micro Text Report BLOOD CULTURE   COMMENT                   NO GROWTH IN 48 HOURS   ANTIBIOTIC  Micro Text Report BLOOD CULTURE   COMMENT                   NO GROWTH IN 48 HOURS   ANTIBIOTIC                       Routine Chem:  20-Jun-15 15:26   Glucose, Serum  249  BUN  29  Creatinine (comp)  1.51  Sodium, Serum  133  Potassium, Serum 5.0  Chloride, Serum 100  CO2, Serum 25  Calcium (Total), Serum 8.7  Anion Gap 8  Osmolality (calc) 281  eGFR (African American)  36  eGFR (Non-African American)  31 (eGFR values <33m/min/1.73 m2 may be an indication of chronic kidney disease (CKD). Calculated eGFR is useful in patients with stable renal function. The eGFR calculation will not be reliable in acutely ill patients when serum creatinine is changing rapidly. It is not useful in  patients on dialysis. The eGFR calculation may not be applicable to patients at the low and high extremes of body sizes, pregnant women, and vegetarians.)  21-Jun-15 04:14   Creatinine (comp)  1.38  Hemoglobin A1c ([facility])  7.7 (The American Diabetes  Association recommends that a primary goal of therapy should be <7% and that physicians should reevaluate the treatment regimen in patients with HbA1c values consistently >8%.)  22-Jun-15 11:01   Glucose, Serum  220  BUN 17  Creatinine (comp) 1.21  Sodium, Serum 137  Potassium, Serum 4.3  Chloride, Serum 105  CO2, Serum 27  Calcium (Total), Serum  8.3  Anion Gap  5  Osmolality (calc) 282  eGFR (African American)  47  eGFR (Non-African American)  41 (eGFR values <667mmin/1.73 m2 may be an indication of chronic kidney disease (CKD). Calculated eGFR is useful in patients with stable renal function. The eGFR calculation will not be reliable in acutely ill patients when serum creatinine is changing rapidly. It is not useful in  patients on dialysis. The eGFR calculation may not be applicable to patients at the low and high extremes of body sizes, pregnant women, and vegetarians.)  Routine UA:  20-Jun-15 15:26   Color (UA) Amber  Clarity (UA) Hazy  Glucose (UA) Negative  Bilirubin (UA) Negative  Ketones (UA) Negative  Specific Gravity (UA) 1.014  Blood (UA) Negative  pH (UA) 5.0  Protein (UA) Negative  Nitrite (UA) Negative  Leukocyte Esterase (UA) 1+ (Result(s) reported on 10 Nov 2013 at 03:54PM.)  RBC (UA) 2 /HPF  WBC (UA) 19 /HPF  Bacteria (UA) NONE SEEN  Epithelial Cells (UA) 3 /HPF  Hyaline Cast (UA) 12 /LPF (Result(s) reported on 10 Nov 2013 at 03:54PM.)  Routine Hem:  20-Jun-15 15:26   WBC (CBC)  20.5  RBC (CBC) 3.86  Hemoglobin (CBC)  11.5  Hematocrit (CBC) 36.3  Platelet Count (CBC) 347  MCV 94  MCH 29.7  MCHC  31.6  RDW  14.7  Neutrophil % 77.7  Lymphocyte % 12.3  Monocyte % 6.9  Eosinophil % 2.3  Basophil % 0.8  Neutrophil #  15.9  Lymphocyte # 2.5  Monocyte #  1.4  Eosinophil # 0.5  Basophil #  0.2 (Result(s) reported on 10 Nov 2013 at 03:45PM.)  22-Jun-15 11:01   WBC (CBC) 9.3 (Result(s) reported on 12 Nov 2013 at 11:19AM.)   PERTINENT  RADIOLOGY STUDIES: XRay:    20-Jun-15 17:04, Chest PA and Lateral  Chest PA and Lateral   REASON FOR EXAM:  leukocytosis, weakness, hypotension, recent (+) CXR  COMMENTS:       PROCEDURE: DXR - DXR CHEST PA (OR AP) AND LATERAL  - Nov 10 2013  5:04PM     CLINICAL DATA:  Weakness, leukocytosis and hypotension.    EXAM:  CHEST  2 VIEW    COMPARISON:  10/29/2013    FINDINGS:  The cardiomediastinal silhouette is unremarkable.  Mild peribronchial thickening is unchanged.    There is no evidence of focal airspace disease, pulmonary edema,  suspicious pulmonary nodule/mass, pleural effusion, or pneumothorax.  No acute bony abnormalities are identified.     IMPRESSION:  No active cardiopulmonary disease.      Electronically Signed    By: Hassan Rowan M.D.    On: 11/10/2013 17:16       Verified By: Lura Em, M.D.,  LabUnknown:  PACS Image    Pertinent Past History:  Pertinent Past History 1.  Recent hospitalization for sepsis, UTI and pneumonia, with COPD exacerbation.  2.  GERD.  3.  History of multiple UTIs in the past.  4.  Bladder surgery.  5.  Hyperlipidemia.  6.  Hypertension.  7.  PVD.  8.  Hypothyroidism.  9.  Urinary incontinence.   Hospital Course:  Hospital Course 87y/oF with COPD, High Blood Pressure (Hypertension) and hyperlipidemia with recent discharge from hospital for pneumonia and Urinary Tract Infection on ABX comes back with leukocytosis and hypotension  * suspected but ruled out sepsis- with leukocytosis and hypotension no source of infec identified- could be just from steroids and hypovolemic hypotension blood cultures negative so far UA with no bacteria, Ur cx- contamination. on vanc and cefepime for now-- discontinue ABX  IV fluids. Improved orthostatic Hypotension.  * Leukocytosis- infec vs steroids- improving  * ARF- improving with fluids  * Hypothyroidism- synthroid  * Hyperlipidemia- statin   Condition on Discharge Stable    Code Status:  Code Status Full Code   DISCHARGE INSTRUCTIONS HOME MEDS:  Medication Reconciliation: Patient's Home Medications at Discharge:     Medication Instructions  fluoxetine 20 mg oral capsule  1 cap(s) orally once a day   simvastatin 20 mg oral tablet  1 tab(s) orally once a day (at bedtime)   singulair 10 mg oral tablet  1 tab(s) orally once a day (at bedtime)   allopurinol 100 mg oral tablet  1 tab(s) orally once a day   synthroid 50 mcg (0.05 mg) oral tablet  1 tab(s) orally once a day   advair diskus 250 mcg-50 mcg inhalation powder  1 puff(s) inhaled 2 times a day   cetirizine 10 mg oral tablet  1 tab(s) orally once a day   methenamine hippurate 1 g oral tablet  1 tab(s) orally 2 times a day (with meals)   mirabegron 25 mg oral tablet, extended release  1 tab(s) orally once a day   protonix 40 mg oral delayed release tablet  1 tab(s) orally once a day   albuterol cfc free 90 mcg/inh inhalation aerosol  2 puff(s) inhaled 4 times a day as needed for wheezing -for shortness of breath   calcium 600+d  1 tab(s) orally once a day   cholecalciferol  1 cap(s) orally once a day   losartan 50 mg oral tablet  0.5 tab(s) orally once a day    STOP TAKING THE FOLLOWING MEDICATION(S):    ceftin 500 mg oral tablet: 1 tab(s) orally 2 times a day  Physician's Instructions:  Diet Low Sodium  Activity Limitations As tolerated   Return to Work Not Applicable   Time frame for Follow Up Appointment 1-2 weeks   Other Comments routine follow ups with PMD.   Electronic Signatures: Vaughan Basta (MD)  (Signed 24-Jun-15 18:58)  Authored: ADMISSION DATE AND DIAGNOSIS, CHIEF COMPLAINT/HPI, Allergies, PERTINENT LABS, PERTINENT RADIOLOGY STUDIES, PERTINENT PAST [place], PATIENT INSTRUCTIONS   Last Updated: 24-Jun-15 18:58 by Vaughan Basta (MD)

## 2014-09-14 NOTE — H&P (Signed)
PATIENT NAME:  Diamond Newman, Diamond Newman MR#:  [id number] DATE OF BIRTH:  1926/11/21  DATE OF ADMISSION:  10/27/2013  REFERRING PHYSICIAN: Dr. Fanny BienQuale.  PRIMARY CARE PHYSICIAN: Dale Durhamharlene Scott, MD  CHIEF COMPLAINT: Cough, weakness.   HISTORY OF PRESENT ILLNESS: An 81 year old Caucasian female with a past medical history of COPD, hypertension, hyperlipidemia presented with cough and weakness. She describes 1 week duration of cough, which is productive in nature, whitish sputum, as well as associated chills and dyspnea on exertion. Denies frank shortness of breath. No fevers. She also denotes having generalized weakness; however, denies any further symptomatology. No chest pain, palpitations, lower extremity edema, or orthopnea. She is incontinent at baseline and has noted no change in her urinary symptoms.   REVIEW OF SYSTEMS:  CONSTITUTIONAL: Positive for fatigue, weakness, chills. Denies fever.  EYES: Denies blurry, double vision, or eye pain.  HEENT: Denies tinnitus, ear pain, hearing loss.  RESPIRATORY: Positive for cough and shortness of breath as described above.  CARDIOVASCULAR: Denies chest pain, palpitations, edema. GASTROINTESTINAL: Denies nausea, vomiting, diarrhea, abdominal pain.  GENITOURINARY: Denies dysuria or hematuria. Positive for incontinence.  ENDOCRINE: Denies nocturia or thyroid problems.  HEMATOLOGIC AND LYMPHATIC: Denies easy bruising, bleeding. SKIN: Denies rashes or lesions. MUSCULOSKELETAL: Denies pain in neck, back, shoulders, knees, hips, or arthritic symptoms.  NEUROLOGIC: Denies paralysis or paresthesias.  PSYCHIATRIC: Denies anxiety or depressive symptoms.   Otherwise, full review of systems performed by me is negative.   PAST MEDICAL HISTORY: Gastroesophageal reflux disease, hyperlipidemia, hypertension, peripheral vascular disease, hypothyroidism, urinary incontinence.   SOCIAL HISTORY: Denies any alcohol, tobacco, or drug usage. However, positive for significant  secondhand smoke from husband as well as father.   FAMILY HISTORY: Positive for COPD, denies any knowledge of cardiovascular illnesses.   ALLERGIES: ACE INHIBITORS, AMOXICILLIN, CIPRO, CODEINE, SULFA DRUGS TETRACYCLINE  HOME MEDICATIONS: Includes: Losartan 50 mg p.o. daily, fluoxetine 20 mg p.o. daily, allopurinol 100 mg p.o. daily, simvastatin 20 mg p.o. at bedtime, Oracea 40 mg p.o. daily, Advair 250/50 mcg 1 puff b.i.d., Singulair 10 mg p.o. daily, Nexium 40 mg p.o. daily, Synthroid 50 mcg p.o. daily, VESIcare 5 mg p.o. every other day, vitamin D3 at 1000 international units p.o. daily, calcium plus vitamin D 1 tablet daily.   PHYSICAL EXAMINATION:  VITAL SIGNS: Temperature 99.5, heart rate 110, respirations 22, blood pressure 127/62, saturating 95% on room air. Weight 86.2 kg, BMI of 31.6.  GENERAL: Well-nourished, well-developed, Caucasian female, currently in no acute distress.  HEAD: Normocephalic, atraumatic.  EYES: Pupils equal, round, reactive to light. Extraocular muscles intact. No scleral icterus.  MOUTH: Moist mucous membranes. Dentition intact. No abscess noted.  EARS, NOSE, AND THROAT: Clear without exudates. No external lesions.  NECK: Supple. No thyromegaly. No nodules. No JVD.  PULMONARY: Bibasilar coarse breath sounds with left lower rhonchi. No use of accessory muscles. Good respiratory effort.  CHEST:  Nontender on palpation.  CARDIOVASCULAR: S1, S2, regular rate and rhythm. No murmurs, rubs, or gallops. No edema. Pedal pulses 2+ bilaterally.  GASTROINTESTINAL: Soft, nontender, nondistended. No masses. Positive bowel sounds. No hepatosplenomegaly.  MUSCULOSKELETAL: No swelling, clubbing, or edema. Range of motion is full in all extremities. NEUROLOGIC: Cranial nerves II-XII intact. No gross neurologic deficits. Sensation intact. Reflexes intact.  SKIN: No ulcerations, lesions, rashes, or cyanosis. Skin warm and dry. Turgor intact. PSYCHIATRIC: Mood and affect within normal  limits. The patient alert and oriented x 3. Insight and judgment intact.   LABORATORY DATA: Sodium 130,chloride 104, bicarbonate 25, BUN 23, creatinine  1.47, glucose 159. LFTs: Albumin of 2.4, otherwise within normal limits. WBC 15.3, hemoglobin 11.5, platelets 376,000.   URINALYSIS: WBC is 97, RBCs 2, leukocyte esterase 3+, nitrite negative, epithelial cells 2.  Chest x-ray performed revealing patchy infiltrate at posterior left base.   ASSESSMENT AND PLAN: An 81 year old Caucasian female with history of chronic obstructive pulmonary disease, hypertension, hyperlipidemia, presenting with cough and weakness.  1. Sepsis meeting sepsis criteria by respiratory rate, heart rate, and leukocytosis. Pan culture including blood and urine and blood cultures, azithromycin and ceftriaxone, likely pulmonary source verus urine, given evidence and labs returned. Provide IV fluid hydration and keep mean arterial pressure greater than 65. DuoNeb therapy q. 4 hours. Incentive spirometry and supplemental O2 to get oxygen saturation greater than 92%.  2. Acute kidney injury. Treat with gentle treated gentle IV fluid hydration. Follow urine output and renal function.  3. Hypothyroidism. Continue Synthroid.  4. Hypertension. Continue his losartan.  5. Venous thromboembolism prophylaxis with heparin subcutaneous.  CODE STATUS: The patient is a full code.  TIME SPENT: 45 minutes.    ____________________________ Cletis Athens. Demetruis Depaul, MD dkh:lt D: 10/27/2013 22:48:32 ET T: 10/28/2013 01:09:47 ET JOB#: [id number]  cc: Cletis Athens. Enoc Getter, MD, <Dictator> Cataldo Cosgriff Synetta Shadow MD ELECTRONICALLY SIGNED 10/29/2013 0:49

## 2014-09-14 NOTE — Discharge Summary (Signed)
PATIENT NAME:  Diamond Newman, Diamond Newman MR#:  [id number] DATE OF BIRTH:  1927/02/26  DATE OF ADMISSION:  10/27/2013 DATE OF DISCHARGE:  11/01/2013  DISPOSITION: Home. Follow up with Dale [place] in the next 1 to 2 weeks.   DISCHARGE DIAGNOSES: 1.  Pneumonia and urinary tract infection.  2.  Chronic obstructive pulmonary disease exacerbation due to pneumonia.  3.  Escherichia coli urinary tract infection.  4.  Sepsis.  5.  Acute renal failure.  6.  Hypothyroidism.  7.  Hypertension.   DISCHARGE MEDICATIONS:  1.  Fluoxetine 20 mg once daily. 2.  Simvastatin 20 mg once daily. 3.  Singulair 10 mg once daily. 4.  Calcium plus vitamin D once daily. 5.  Vitamin D3 1,000 units once daily. 6.  Allopurinol 100 mg daily. 7.  Synthroid 50 mcg once daily. 8.  Advair Diskus 250/50 one puff twice daily. 9.  Losartan 50 mg daily. 10.  Zyrtec 10 mg once daily. 11.  Albuterol inhaler as needed for shortness of breath.  12.  Cholecalciferol once daily.  13.  Methenamine 1 gram twice daily with meals. 14.  Mirabegron 25 mg extended release once a day. 15.  Protonix 40 mg once a day. 16.  Prednisone taper starting at 60 mg decrease by 10 every day until gone, total 6 days. 17.  Ceftin 500 mg twice daily for 9 days. 18.  Azithromycin 500 mg once daily for 2 more days. This is to complete a course of 7 days of azithromycin and 14 days of Ceftin.   DISCHARGE FOLLOWUP:  With Dr. Dale Dulce.  HOSPITAL COURSE: This is a very nice 81 year old female with history of COPD, hypertension, and GERD who comes with a complaint of cough and weakness. The patient had week duration of the cough which became productive with sputum that was mostly white associated with chills and dyspnea on exertion. The patient was very short of breath with difficulty ambulating due to this, feeling really weak, needed to use a walker that she does not use on a regular basis in order to get around the house. The patient was evaluated in the  Emergency Department where she was noted to have urine incontinence which apparently is chronic. She was tachycardic at 110, tachypneic at 22, with a blood pressure 127/62 and a temperature of 99.5 At the moment, her white blood cell count was 15.3 as well as 97 white blood cells in urine, which was nitrite negative but positive leukocyte esterase. Her chest x-ray showed a patchy infiltrate at the posterior left base. The patient was admitted for treatment of sepsis, which was secondary to a combination of pneumonia and UTI. As far as her medical problems goes: 1.  Sepsis. The patient was admitted with meeting criteria for sepsis with tachycardia, tachypnea, and leukocytosis. Blood cultures were drawn, which were negative. The patient was treated for pneumonia with ceftriaxone and azithromycin. The patient had significant improvement within the next 5 days. The patient was having decreased O2 sats with oxygen saturations below 92 for what she was on oxygen p.r.n.  2.  She had significant cough and wheezing for what she was put on steroids to also treat COPD exacerbation. As far as her urine culture, it showed E. coli that was pansensitive. Continue Ceftin. 3.  As far as her acute renal failure, this was secondary to the sepsis. The patient received IV fluids and the problem improved. Her creatinine was up to 1.47. Her BUN was 23. At discharge, her creatinine  is 1.23. She seems to have chronic kidney disease with a GFR around 40 at discharge.  4.  Her white blood count dropped from 15.3 to 11.1 on June 7th and then was not rechecked due to good clinical improvement.   Other than that, the patient did well during this hospitalization. She was able to ambulate without assistance. She is still a little bit weak, but she feels like with some rest she will do okay. We offered home health, but the patient feels that she can do okay.  TIME SPENT ON DISCHARGE: About 45 minutes.      ____________________________ Felipa Furnaceoberto Sanchez Gutierrez, MD rsg:sb D: 11/01/2013 11:44:40 ET T: 11/01/2013 12:01:21 ET JOB#: [id number]  cc: Felipa Furnaceoberto Sanchez Gutierrez, MD, <Dictator> Dale Durhamharlene Scott, MD Pearletha FurlOBERTO SANCHEZ GUTIERRE MD ELECTRONICALLY SIGNED 11/14/2013 11:15

## 2014-09-20 ENCOUNTER — Emergency Department: Admit: 2014-09-20 | Disposition: A | Discharge status: Home / Self Care | Payer: Self-pay | Admitting: Student

## 2014-09-20 DIAGNOSIS — Z7951 Long term (current) use of inhaled steroids: Secondary | ICD-10-CM | POA: Diagnosis not present

## 2014-09-20 DIAGNOSIS — Z79899 Other long term (current) drug therapy: Secondary | ICD-10-CM | POA: Diagnosis not present

## 2014-09-20 DIAGNOSIS — S299XXA Unspecified injury of thorax, initial encounter: Secondary | ICD-10-CM | POA: Diagnosis not present

## 2014-09-20 DIAGNOSIS — R079 Chest pain, unspecified: Secondary | ICD-10-CM | POA: Diagnosis not present

## 2014-09-20 DIAGNOSIS — S20212A Contusion of left front wall of thorax, initial encounter: Secondary | ICD-10-CM | POA: Diagnosis not present

## 2014-09-20 DIAGNOSIS — Z880 Allergy status to penicillin: Secondary | ICD-10-CM | POA: Diagnosis not present

## 2014-09-20 DIAGNOSIS — S20211A Contusion of right front wall of thorax, initial encounter: Secondary | ICD-10-CM | POA: Diagnosis not present

## 2014-10-03 ENCOUNTER — Ambulatory Visit (INDEPENDENT_AMBULATORY_CARE_PROVIDER_SITE_OTHER): Payer: Medicare Other | Admitting: *Deleted

## 2014-10-03 DIAGNOSIS — E538 Deficiency of other specified B group vitamins: Secondary | ICD-10-CM

## 2014-10-03 MED ORDER — CYANOCOBALAMIN 1000 MCG/ML IJ SOLN
1000.0000 ug | Freq: Once | INTRAMUSCULAR | Status: AC
  Administered 2014-10-03: 1000 ug via INTRAMUSCULAR

## 2014-11-05 ENCOUNTER — Ambulatory Visit (INDEPENDENT_AMBULATORY_CARE_PROVIDER_SITE_OTHER): Payer: Medicare Other

## 2014-11-05 DIAGNOSIS — E538 Deficiency of other specified B group vitamins: Secondary | ICD-10-CM

## 2014-11-05 MED ORDER — CYANOCOBALAMIN 1000 MCG/ML IJ SOLN
1000.0000 ug | Freq: Once | INTRAMUSCULAR | Status: AC
  Administered 2014-11-05: 1000 ug via INTRAMUSCULAR

## 2014-11-06 ENCOUNTER — Other Ambulatory Visit: Payer: Self-pay | Admitting: *Deleted

## 2014-11-06 MED ORDER — LOSARTAN POTASSIUM 100 MG PO TABS: 100.0000 mg | ORAL_TABLET | Freq: Every day | ORAL | Status: DC

## 2014-11-19 ENCOUNTER — Other Ambulatory Visit (INDEPENDENT_AMBULATORY_CARE_PROVIDER_SITE_OTHER): Payer: Medicare Other

## 2014-11-19 DIAGNOSIS — I10 Essential (primary) hypertension: Secondary | ICD-10-CM | POA: Diagnosis not present

## 2014-11-19 DIAGNOSIS — K219 Gastro-esophageal reflux disease without esophagitis: Secondary | ICD-10-CM | POA: Diagnosis not present

## 2014-11-19 DIAGNOSIS — E7800 Pure hypercholesterolemia, unspecified: Secondary | ICD-10-CM

## 2014-11-19 DIAGNOSIS — E119 Type 2 diabetes mellitus without complications: Secondary | ICD-10-CM | POA: Diagnosis not present

## 2014-11-19 LAB — HEPATIC FUNCTION PANEL
ALT: 11 U/L (ref 0–35)
AST: 17 U/L (ref 0–37)
Albumin: 3.6 g/dL (ref 3.5–5.2)
Alkaline Phosphatase: 70 U/L (ref 39–117)
Bilirubin, Direct: 0.1 mg/dL (ref 0.0–0.3)
TOTAL PROTEIN: 6.4 g/dL (ref 6.0–8.3)
Total Bilirubin: 0.5 mg/dL (ref 0.2–1.2)

## 2014-11-19 LAB — BASIC METABOLIC PANEL
BUN: 25 mg/dL — AB (ref 6–23)
CALCIUM: 9.6 mg/dL (ref 8.4–10.5)
CO2: 29 mEq/L (ref 19–32)
CREATININE: 1.36 mg/dL — AB (ref 0.40–1.20)
Chloride: 108 mEq/L (ref 96–112)
GFR: 39.18 mL/min — AB (ref >60.00)
GLUCOSE: 133 mg/dL — AB (ref 70–99)
Potassium: 4.8 mEq/L (ref 3.5–5.1)
Sodium: 144 mEq/L (ref 135–145)

## 2014-11-19 LAB — CBC WITH DIFFERENTIAL/PLATELET
BASOS PCT: 0.4 % (ref 0.0–3.0)
Basophils Absolute: 0 10*3/uL (ref 0.0–0.1)
Eosinophils Absolute: 0.4 10*3/uL (ref 0.0–0.7)
Eosinophils Relative: 4.4 % (ref 0.0–5.0)
HEMATOCRIT: 36.4 % (ref 36.0–46.0)
Hemoglobin: 11.7 g/dL — ABNORMAL LOW (ref 12.0–15.0)
Lymphocytes Relative: 32.1 % (ref 12.0–46.0)
Lymphs Abs: 2.9 10*3/uL (ref 0.7–4.0)
MCHC: 32.2 g/dL (ref 30.0–36.0)
MCV: 93.6 fl (ref 78.0–100.0)
Monocytes Absolute: 0.7 10*3/uL (ref 0.1–1.0)
Monocytes Relative: 8.2 % (ref 3.0–12.0)
NEUTROS ABS: 5 10*3/uL (ref 1.4–7.7)
NEUTROS PCT: 54.9 % (ref 43.0–77.0)
PLATELETS: 296 10*3/uL (ref 150.0–400.0)
RBC: 3.89 Mil/uL (ref 3.87–5.11)
RDW: 15 % (ref 11.5–15.5)
WBC: 9.1 10*3/uL (ref 4.0–10.5)

## 2014-11-19 LAB — LIPID PANEL
Cholesterol: 143 mg/dL (ref 0–200)
HDL: 47.1 mg/dL (ref >39.00)
LDL CALC: 80 mg/dL (ref 0–99)
NonHDL: 95.9
Total CHOL/HDL Ratio: 3
Triglycerides: 82 mg/dL (ref 0.0–149.0)
VLDL: 16.4 mg/dL (ref 0.0–40.0)

## 2014-11-19 LAB — TSH: TSH: 2.27 u[IU]/mL (ref 0.35–4.50)

## 2014-11-19 LAB — HEMOGLOBIN A1C: Hgb A1c MFr Bld: 6.3 % (ref 4.6–6.5)

## 2014-11-20 ENCOUNTER — Encounter: Payer: Self-pay | Admitting: Internal Medicine

## 2014-11-21 ENCOUNTER — Ambulatory Visit (INDEPENDENT_AMBULATORY_CARE_PROVIDER_SITE_OTHER): Payer: Medicare Other | Admitting: Internal Medicine

## 2014-11-21 ENCOUNTER — Encounter: Payer: Self-pay | Admitting: Internal Medicine

## 2014-11-21 VITALS — BP 106/80 | HR 79 | Temp 98.1°F | Ht 66.5 in | Wt 187.4 lb

## 2014-11-21 DIAGNOSIS — I739 Peripheral vascular disease, unspecified: Secondary | ICD-10-CM

## 2014-11-21 DIAGNOSIS — I10 Essential (primary) hypertension: Secondary | ICD-10-CM | POA: Diagnosis not present

## 2014-11-21 DIAGNOSIS — N183 Chronic kidney disease, stage 3 unspecified: Secondary | ICD-10-CM

## 2014-11-21 DIAGNOSIS — J449 Chronic obstructive pulmonary disease, unspecified: Secondary | ICD-10-CM

## 2014-11-21 DIAGNOSIS — E538 Deficiency of other specified B group vitamins: Secondary | ICD-10-CM

## 2014-11-21 DIAGNOSIS — E7800 Pure hypercholesterolemia, unspecified: Secondary | ICD-10-CM

## 2014-11-21 DIAGNOSIS — K219 Gastro-esophageal reflux disease without esophagitis: Secondary | ICD-10-CM

## 2014-11-21 DIAGNOSIS — E669 Obesity, unspecified: Secondary | ICD-10-CM

## 2014-11-21 DIAGNOSIS — E119 Type 2 diabetes mellitus without complications: Secondary | ICD-10-CM

## 2014-11-21 DIAGNOSIS — N289 Disorder of kidney and ureter, unspecified: Secondary | ICD-10-CM | POA: Diagnosis not present

## 2014-11-21 DIAGNOSIS — R928 Other abnormal and inconclusive findings on diagnostic imaging of breast: Secondary | ICD-10-CM

## 2014-11-21 NOTE — Progress Notes (Signed)
[name], female   DOB: 03-Feb-1927, 81 y.o.   MRN: [id number]   Subjective:    [name], female    DOB: [birth year], 81 y.o.   MRN: [id number]  HPI  Patient here to follow up on her current medical issues as well as for her complete physical exam.  Going to silver sneakers.  Is exercising.  No cardiac symptoms with increased activity or exertion.  No increased sob.  Breathing stable.  Feels better since she has been exercising.  More energy.  She is accompanied by her daughter.  History obtained from both of them.  Does not drink an increased amount of fluids.  Discussed importance of staying hydrated.  Bowels stable.     Past Medical History  Diagnosis Date  . Hypertension   . Hypercholesterolemia   . Peripheral vascular disease   . GERD (gastroesophageal reflux disease)     hiatal hernia  . History of frequent urinary tract infections   . Diabetes mellitus   . History of colon polyps   . Hematuria     followed by urology  . Gout   . Humeral fracture 9/09    comminuted impacted proximal    Current Outpatient Prescriptions on File Prior to Visit  Medication Sig Dispense Refill  . albuterol (PROVENTIL HFA;VENTOLIN HFA) 108 (90 BASE) MCG/ACT inhaler Inhale 2 puffs into the lungs every 6 (six) hours as needed for wheezing or shortness of breath. 1 Inhaler 1  . allopurinol (ZYLOPRIM) 100 MG tablet Take 1 tablet (100 mg total) by mouth daily. 30 tablet 5  . Calcium Carbonate-Vitamin D (CALCIUM 600+D) 600-400 MG-UNIT per tablet Take 1 tablet by mouth daily.     . cetirizine (ZYRTEC) 10 MG tablet Take 10 mg by mouth daily.    . Cholecalciferol (VITAMIN D-3) 1000 UNITS CAPS Take 1 capsule by mouth daily.    . Cranberry-Cholecalciferol 4200-500 MG-UNIT CAPS Take by mouth.    Marland Kitchen FLUoxetine (PROZAC) 20 MG capsule Take 1 capsule (20 mg total) by mouth daily. 30 capsule 2  . Fluticasone-Salmeterol (ADVAIR DISKUS) 500-50 MCG/DOSE AEPB Inhale 1 puff into the lungs 2  (two) times daily. 60 each 0  . levothyroxine (SYNTHROID) 50 MCG tablet Take 1 tablet (50 mcg total) by mouth daily. 30 tablet 5  . losartan (COZAAR) 100 MG tablet Take 1 tablet (100 mg total) by mouth daily. 30 tablet 5  . methenamine (HIPREX) 1 G tablet Take 1 tablet (1 g total) by mouth 2 (two) times daily with a meal. 60 tablet 0  . mirabegron ER (MYRBETRIQ) 25 MG TB24 tablet Take 1 tablet (25 mg total) by mouth daily. 30 tablet 5  . montelukast (SINGULAIR) 10 MG tablet Take 1 tablet (10 mg total) by mouth at bedtime. 30 tablet 5  . pantoprazole (PROTONIX) 40 MG tablet Take 1 tablet (40 mg total) by mouth daily. 30 tablet 5  . simvastatin (ZOCOR) 20 MG tablet Take 1 tablet (20 mg total) by mouth daily. 30 tablet 5   No current facility-administered medications on file prior to visit.    Review of Systems  Constitutional: Negative for appetite change and unexpected weight change.  HENT: Negative for congestion and sinus pressure.   Respiratory: Negative for cough, chest tightness and shortness of breath.   Cardiovascular: Negative for chest pain, palpitations and leg swelling.  Gastrointestinal: Negative for nausea, vomiting, abdominal pain and diarrhea.  Genitourinary: Negative for dysuria and difficulty urinating.  Musculoskeletal: Negative  for back pain and joint swelling.  Skin: Negative for color change and rash.  Neurological: Negative for dizziness, light-headedness and headaches.  Psychiatric/Behavioral: Negative for dysphoric mood and agitation.       Objective:     Blood pressure recheck:  128/76  Physical Exam  Constitutional: She appears well-developed and well-nourished. No distress.  HENT:  Nose: Nose normal.  Mouth/Throat: Oropharynx is clear and moist.  Neck: Neck supple. No thyromegaly present.  Cardiovascular: Normal rate and regular rhythm.   Pulmonary/Chest: Breath sounds normal. No respiratory distress. She has no wheezes.  Abdominal: Soft. Bowel sounds  are normal. There is no tenderness.  Musculoskeletal: She exhibits no edema or tenderness.  Lymphadenopathy:    She has no cervical adenopathy.  Skin: No rash noted. No erythema.  Psychiatric: She has a normal mood and affect. Her behavior is normal.    BP 106/80 mmHg  Pulse 79  Temp(Src) 98.1 F (36.7 C) (Oral)  Ht 5' 6.5" (1.689 m)  Wt 187 lb 6 oz (84.993 kg)  BMI 29.79 kg/m2  SpO2 94% Wt Readings from Last 3 Encounters:  11/21/14 187 lb 6 oz (84.993 kg)  09/11/14 182 lb (82.555 kg)  07/12/14 178 lb 8 oz (80.967 kg)     Lab Results  Component Value Date   WBC 9.1 11/19/2014   HGB 11.7* 11/19/2014   HCT 36.4 11/19/2014   PLT 296.0 11/19/2014   GLUCOSE 133* 11/19/2014   CHOL 143 11/19/2014   TRIG 82.0 11/19/2014   HDL 47.10 11/19/2014   LDLCALC 80 11/19/2014   ALT 11 11/19/2014   AST 17 11/19/2014   NA 144 11/19/2014   K 4.8 11/19/2014   CL 108 11/19/2014   CREATININE 1.36* 11/19/2014   BUN 25* 11/19/2014   CO2 29 11/19/2014   TSH 2.27 11/19/2014   HGBA1C 6.3 11/19/2014   MICROALBUR 3.6* 11/21/2014       Assessment & Plan:   Problem List Items Addressed This Visit    Abnormal mammogram    Mammogram - see above.  Due f/u bilateral mammogram with right breast ultrasound in 12/2014.  Schedule.        Relevant Orders   MM Digital Diagnostic Bilat   US BREAST COMPLETE UNI RIGHT INC AXILLA   Chronic obstructive airway disease with asthma    Continue exercise.  Continue current inhaler regimen.  Breathing stable.        CKD (chronic kidney disease) stage 3, GFR 30-59 ml/min    GFR - 39 on recent check.  Stay hydrated.  Follow metabolic panel.        Diabetes mellitus    A1c just checked - 6.3.  Low carb diet and exercise.  Follow met b and a1c.  Stay up to date with eye checks.        GERD (gastroesophageal reflux disease)    Controlled.        Hypercholesterolemia    Low cholesterol diet and exercise.  On simvastatin.  Follow lipid panel and liver  function tests.   Lab Results  Component Value Date   CHOL 143 11/19/2014   HDL 47.10 11/19/2014   LDLCALC 80 11/19/2014   TRIG 82.0 11/19/2014   CHOLHDL 3 11/19/2014        Hypertension    Blood pressure has been under good control.  Same medication regimen.  Follow pressures.  Follow metabolic panel.        Obesity (BMI 30.0-34.9)    Diet and exercise.  Peripheral vascular disease    Asymptomatic.  Varying pressures in each arm.  Stable.        Vitamin B12 deficiency    Continue B12 injections.        Other Visit Diagnoses    Renal insufficiency    -  Primary    Relevant Orders    Basic metabolic panel    Urine Microalbumin w/creat. ratio (Completed)    Diabetes mellitus without complication        Relevant Orders    Urine Microalbumin w/creat. ratio (Completed)      I spent 25 minutes with the patient and more than 50% of the time was spent in consultation regarding the above.     Einar Pheasant, MD

## 2014-11-21 NOTE — Progress Notes (Signed)
Pre visit review using our clinic review tool, if applicable. No additional management support is needed unless otherwise documented below in the visit note. 

## 2014-11-22 LAB — MICROALBUMIN / CREATININE URINE RATIO
Creatinine,U: 47 mg/dL
Microalb Creat Ratio: 7.7 mg/g (ref 0.0–30.0)
Microalb, Ur: 3.6 mg/dL — ABNORMAL HIGH (ref 0.0–1.9)

## 2014-11-23 ENCOUNTER — Encounter: Payer: Self-pay | Admitting: Internal Medicine

## 2014-11-24 ENCOUNTER — Encounter: Payer: Self-pay | Admitting: Internal Medicine

## 2014-11-24 DIAGNOSIS — N183 Chronic kidney disease, stage 3 unspecified: Secondary | ICD-10-CM | POA: Insufficient documentation

## 2014-11-24 NOTE — Assessment & Plan Note (Signed)
Low cholesterol diet and exercise.  On simvastatin.  Follow lipid panel and liver function tests.   Lab Results  Component Value Date   CHOL 143 11/19/2014   HDL 47.10 11/19/2014   LDLCALC 80 11/19/2014   TRIG 82.0 11/19/2014   CHOLHDL 3 11/19/2014

## 2014-11-24 NOTE — Assessment & Plan Note (Signed)
A1c just checked - 6.3.  Low carb diet and exercise.  Follow met b and a1c.  Stay up to date with eye checks.

## 2014-11-24 NOTE — Assessment & Plan Note (Signed)
Asymptomatic.  Varying pressures in each arm.  Stable.

## 2014-11-24 NOTE — Assessment & Plan Note (Signed)
Continue exercise.  Continue current inhaler regimen.  Breathing stable.

## 2014-11-24 NOTE — Assessment & Plan Note (Signed)
GFR - 39 on recent check.  Stay hydrated.  Follow metabolic panel.

## 2014-11-24 NOTE — Assessment & Plan Note (Signed)
Diet and exercise.   

## 2014-11-24 NOTE — Assessment & Plan Note (Signed)
Mammogram - see above.  Due f/u bilateral mammogram with right breast ultrasound in 12/2014.  Schedule.

## 2014-11-24 NOTE — Assessment & Plan Note (Signed)
Blood pressure has been under good control.  Same medication regimen.  Follow pressures.   Follow metabolic panel.   

## 2014-11-24 NOTE — Assessment & Plan Note (Signed)
Continue B12 injections.   

## 2014-11-24 NOTE — Assessment & Plan Note (Signed)
Controlled.  

## 2014-11-27 NOTE — Telephone Encounter (Signed)
Unread mychart message mailed to patient 

## 2014-11-29 ENCOUNTER — Other Ambulatory Visit: Payer: Self-pay | Admitting: Internal Medicine

## 2014-11-29 DIAGNOSIS — R928 Other abnormal and inconclusive findings on diagnostic imaging of breast: Secondary | ICD-10-CM

## 2014-11-29 NOTE — Progress Notes (Signed)
Order placed for left breast ultrasound.  

## 2014-12-05 ENCOUNTER — Ambulatory Visit (INDEPENDENT_AMBULATORY_CARE_PROVIDER_SITE_OTHER): Payer: Medicare Other | Admitting: *Deleted

## 2014-12-05 ENCOUNTER — Other Ambulatory Visit (INDEPENDENT_AMBULATORY_CARE_PROVIDER_SITE_OTHER): Payer: Medicare Other

## 2014-12-05 DIAGNOSIS — N289 Disorder of kidney and ureter, unspecified: Secondary | ICD-10-CM

## 2014-12-05 DIAGNOSIS — E538 Deficiency of other specified B group vitamins: Secondary | ICD-10-CM

## 2014-12-05 LAB — BASIC METABOLIC PANEL
BUN: 27 mg/dL — ABNORMAL HIGH (ref 6–23)
CO2: 27 mEq/L (ref 19–32)
CREATININE: 1.66 mg/dL — AB (ref 0.40–1.20)
Calcium: 9.4 mg/dL (ref 8.4–10.5)
Chloride: 105 mEq/L (ref 96–112)
GFR: 31.13 mL/min — AB (ref >60.00)
Glucose, Bld: 177 mg/dL — ABNORMAL HIGH (ref 70–99)
POTASSIUM: 4.2 meq/L (ref 3.5–5.1)
Sodium: 140 mEq/L (ref 135–145)

## 2014-12-05 MED ORDER — CYANOCOBALAMIN 1000 MCG/ML IJ SOLN
1000.0000 ug | Freq: Once | INTRAMUSCULAR | Status: AC
  Administered 2014-12-05: 1000 ug via INTRAMUSCULAR

## 2014-12-06 ENCOUNTER — Other Ambulatory Visit: Payer: Self-pay | Admitting: Internal Medicine

## 2014-12-06 DIAGNOSIS — N289 Disorder of kidney and ureter, unspecified: Secondary | ICD-10-CM

## 2014-12-06 NOTE — Progress Notes (Signed)
Order placed for f/u labs.  

## 2014-12-10 ENCOUNTER — Encounter: Payer: Self-pay | Admitting: Internal Medicine

## 2014-12-17 ENCOUNTER — Telehealth: Payer: Self-pay

## 2014-12-17 ENCOUNTER — Encounter: Payer: Self-pay | Admitting: *Deleted

## 2014-12-17 NOTE — Telephone Encounter (Signed)
See lab note.  

## 2014-12-17 NOTE — Telephone Encounter (Signed)
Pt's daughter called back and made lab appointment for next Tuesday and is okay with referral to nephrology.

## 2014-12-18 ENCOUNTER — Other Ambulatory Visit: Payer: Self-pay | Admitting: Internal Medicine

## 2014-12-18 DIAGNOSIS — N183 Chronic kidney disease, stage 3 unspecified: Secondary | ICD-10-CM

## 2014-12-18 NOTE — Progress Notes (Signed)
Order placed for referral to nephrology.  

## 2014-12-18 NOTE — Telephone Encounter (Signed)
Order placed for referral to nephrology.  

## 2014-12-24 ENCOUNTER — Other Ambulatory Visit (INDEPENDENT_AMBULATORY_CARE_PROVIDER_SITE_OTHER): Payer: Medicare Other

## 2014-12-24 ENCOUNTER — Other Ambulatory Visit: Payer: Self-pay

## 2014-12-24 DIAGNOSIS — N289 Disorder of kidney and ureter, unspecified: Secondary | ICD-10-CM

## 2014-12-24 LAB — BASIC METABOLIC PANEL
BUN: 33 mg/dL — AB (ref 6–23)
CO2: 28 meq/L (ref 19–32)
Calcium: 9.6 mg/dL (ref 8.4–10.5)
Chloride: 109 mEq/L (ref 96–112)
Creatinine, Ser: 1.35 mg/dL — ABNORMAL HIGH (ref 0.40–1.20)
GFR: 39.51 mL/min — AB (ref >60.00)
GLUCOSE: 103 mg/dL — AB (ref 70–99)
Potassium: 4.3 mEq/L (ref 3.5–5.1)
Sodium: 144 mEq/L (ref 135–145)

## 2014-12-24 NOTE — Telephone Encounter (Signed)
Pt got this medication refilled here in April.  Prior to that was getting refilled by another md.  Please call pharmacy and see who was prescribing prior to last refill and if dose was the same as the last refill.  Thanks.

## 2014-12-24 NOTE — Telephone Encounter (Signed)
Received a refill request for Methenamine Hipp 1gm tablet. Last refilled 08/24/14 for #60 with 0 refills. Okay to refill?

## 2014-12-25 ENCOUNTER — Other Ambulatory Visit: Payer: Self-pay | Admitting: Internal Medicine

## 2014-12-25 ENCOUNTER — Encounter: Payer: Self-pay | Admitting: Internal Medicine

## 2014-12-25 LAB — URINALYSIS, ROUTINE W REFLEX MICROSCOPIC
BILIRUBIN URINE: NEGATIVE
KETONES UR: NEGATIVE
NITRITE: POSITIVE — AB
PH: 6 (ref 5.0–8.0)
Specific Gravity, Urine: 1.015 (ref 1.000–1.030)
Total Protein, Urine: 30 — AB
Urine Glucose: NEGATIVE
Urobilinogen, UA: 0.2 (ref 0.0–1.0)

## 2014-12-25 NOTE — Telephone Encounter (Signed)
Pharmacist called states pt reports a Rx for Methenamine 1G was to sent in.  The pharmacist reports the last prescriber was Dr Leatrice Jewels from [facility] last prescribed by them 07/2013.   Pharmacist also requests clarification on Losartan 100 mg Rx.  As per pt MD decreased Losartan to 50 mg daily.  Please advise

## 2014-12-25 NOTE — Telephone Encounter (Signed)
Please clarify with pt or pts daughter what dose she has been taking.  If has been taking , then refill  dose.

## 2014-12-25 NOTE — Telephone Encounter (Signed)
rx okd for hiprex #30 with one refill.   

## 2014-12-26 ENCOUNTER — Telehealth: Payer: Self-pay | Admitting: Internal Medicine

## 2014-12-26 ENCOUNTER — Other Ambulatory Visit: Payer: Medicare Other

## 2014-12-26 ENCOUNTER — Other Ambulatory Visit: Payer: Self-pay | Admitting: Internal Medicine

## 2014-12-26 DIAGNOSIS — R8281 Pyuria: Secondary | ICD-10-CM

## 2014-12-26 DIAGNOSIS — N390 Urinary tract infection, site not specified: Secondary | ICD-10-CM | POA: Diagnosis not present

## 2014-12-26 DIAGNOSIS — R8299 Other abnormal findings in urine: Secondary | ICD-10-CM | POA: Diagnosis not present

## 2014-12-26 NOTE — Telephone Encounter (Signed)
Sent copy of note to pharmacy for response

## 2014-12-26 NOTE — Addendum Note (Signed)
Addended by: Cristela Blue on: 12/26/2014 03:41 PM   Modules accepted: Orders

## 2014-12-26 NOTE — Telephone Encounter (Signed)
Spoke with patient.  She will return today to provide a urine specimen for culture.

## 2014-12-26 NOTE — Telephone Encounter (Signed)
Pt called stating she received a call. Pt wasn't sure what the message was stating, all she heard was call back to schedule not what to schedule. Pt would like a call back to clarify message. rm

## 2014-12-26 NOTE — Telephone Encounter (Signed)
Rx was refilled yesterday

## 2014-12-29 LAB — CULTURE, URINE COMPREHENSIVE

## 2014-12-30 ENCOUNTER — Ambulatory Visit (INDEPENDENT_AMBULATORY_CARE_PROVIDER_SITE_OTHER): Payer: Medicare Other | Admitting: Family Medicine

## 2014-12-30 ENCOUNTER — Encounter: Payer: Self-pay | Admitting: Family Medicine

## 2014-12-30 ENCOUNTER — Ambulatory Visit (INDEPENDENT_AMBULATORY_CARE_PROVIDER_SITE_OTHER)
Admission: RE | Admit: 2014-12-30 | Discharge: 2014-12-30 | Disposition: A | Discharge status: Home / Self Care | Payer: Medicare Other | Source: Ambulatory Visit | Attending: Family Medicine | Admitting: Family Medicine

## 2014-12-30 ENCOUNTER — Ambulatory Visit
Admission: RE | Admit: 2014-12-30 | Discharge: 2014-12-30 | Disposition: A | Discharge status: Home / Self Care | Payer: Medicare Other | Source: Ambulatory Visit | Attending: Family Medicine | Admitting: Family Medicine

## 2014-12-30 VITALS — BP 112/62 | HR 90 | Temp 98.0°F | Ht 66.25 in | Wt 167.0 lb

## 2014-12-30 DIAGNOSIS — W1800XA Striking against unspecified object with subsequent fall, initial encounter: Secondary | ICD-10-CM | POA: Insufficient documentation

## 2014-12-30 DIAGNOSIS — M546 Pain in thoracic spine: Secondary | ICD-10-CM | POA: Diagnosis not present

## 2014-12-30 DIAGNOSIS — W1809XA Striking against other object with subsequent fall, initial encounter: Secondary | ICD-10-CM | POA: Diagnosis not present

## 2014-12-30 DIAGNOSIS — S299XXA Unspecified injury of thorax, initial encounter: Secondary | ICD-10-CM | POA: Diagnosis not present

## 2014-12-30 DIAGNOSIS — R0781 Pleurodynia: Secondary | ICD-10-CM | POA: Diagnosis not present

## 2014-12-30 DIAGNOSIS — M549 Dorsalgia, unspecified: Secondary | ICD-10-CM | POA: Diagnosis not present

## 2014-12-30 MED ORDER — HYDROCODONE-ACETAMINOPHEN 5-325 MG PO TABS: 1.0000 | ORAL_TABLET | Freq: Four times a day (QID) | ORAL | Status: DC | PRN

## 2014-12-30 NOTE — Progress Notes (Signed)
Pre visit review using our clinic review tool, if applicable. No additional management support is needed unless otherwise documented below in the visit note. 

## 2014-12-30 NOTE — Progress Notes (Addendum)
Subjective:  [name], female    DOB: 01-21-1927  Age: 81 y.o. MRN: [id number]  CC: Back pain/Fall  HPI 81 year old female with a PMH of HTN, HLD, PVD, DM-2, and Osteopenia presents to the clinic with complaints of back pain/rib pain.  1) Back pain/rib pain  Patient reports that Friday night she got up to use restroom in the middle night and then made her way to the living room.  She states that she had difficulty finding the light switch and as a result got her "feet tangled up" and fell.  She states that she hit the right side of her upper back on the back of a chair.  Since that time she has had moderate to severe pain.  Pain is worse with motion and particularly breathing/coughing.  She has been taking tylenol with some relief.  She endorses some associated SOB but states that she is always short of breath. No recent fever, chills. No other associated symptoms.   Social Hx - Nonsmoker.  Review of Systems  Constitutional: Negative for fever and chills.  Respiratory: Positive for shortness of breath.   Musculoskeletal: Positive for back pain.    Objective:  BP 112/62 mmHg  Pulse 90  Temp(Src) 98 F (36.7 C) (Oral)  Ht 5' 6.25" (1.683 m)  Wt 167 lb (75.751 kg)  BMI 26.74 kg/m2  SpO2 97%  BP/Weight 12/30/2014 11/21/2014 09/11/2014  Systolic BP 112 106 128  Diastolic BP 62 80 68  Wt. (Lbs) 167 187.38 182  BMI 26.74 29.79 28.94    Physical Exam  Constitutional: No distress.  Well appearing.  HENT:  Head: Normocephalic and atraumatic.  Neck: Neck supple.  Cardiovascular: Normal rate and regular rhythm.   No murmur heard. Pulmonary/Chest: Effort normal and breath sounds normal. No respiratory distress. She has no wheezes. She has no rales.  Abdominal: Soft. She exhibits no distension. There is no tenderness.  Musculoskeletal:  Thoracic spine/lower ribs - no spinous tenderness. Posterior mid-lower ribs tender to palpation. No bruising noted.     Skin: Skin is warm and dry.  Psychiatric: She has a normal mood and affect.    Lab Results  Component Value Date   WBC 9.1 11/19/2014   HGB 11.7* 11/19/2014   HCT 36.4 11/19/2014   PLT 296.0 11/19/2014   GLUCOSE 103* 12/24/2014   CHOL 143 11/19/2014   TRIG 82.0 11/19/2014   HDL 47.10 11/19/2014   LDLCALC 80 11/19/2014   ALT 11 11/19/2014   AST 17 11/19/2014   NA 144 12/24/2014   K 4.3 12/24/2014   CL 109 12/24/2014   CREATININE 1.35* 12/24/2014   BUN 33* 12/24/2014   CO2 28 12/24/2014   TSH 2.27 11/19/2014   HGBA1C 6.3 11/19/2014   MICROALBUR 3.6* 11/21/2014    Assessment & Plan:   Problem List Items Addressed This Visit    Fall against object - Primary    Patient with right sided rib pain. No red flags on exam.  Chest xray/rib film as well as T spine xray obtained today for further evaluation (especially in setting of osteopenia). I reviewed all the images personally.  All xrays were negative for acute fracture. Lung fields clear. No Pneumothorax.  Pain is MSK from fall/injury. Treating with PRN Hydrocodone. Patient to follow up closely with PCP.      Relevant Orders   DG Ribs Unilateral W/Chest Right (Completed)   DG Thoracic Spine 2 View (Completed)  Meds ordered this encounter  Medications  . HYDROcodone-acetaminophen (NORCO/VICODIN) 5-325 MG per tablet    Sig: Take 1 tablet by mouth every 6 (six) hours as needed for moderate pain.    Dispense:  30 tablet    Refill:  0     Follow-up: Return if symptoms worsen or fail to improve.    Everlene Other, DO

## 2014-12-30 NOTE — Patient Instructions (Signed)
It was nice to see you today.  We will call with the results of your xrays.  Take the pain medication as needed.  Follow up with Dr. Lorin Picket in the next few weeks.  Take care  Dr. Adriana Simas

## 2014-12-30 NOTE — Assessment & Plan Note (Addendum)
Patient with right sided rib pain. No red flags on exam.  Chest xray/rib film as well as T spine xray obtained today for further evaluation (especially in setting of osteopenia). I reviewed all the images personally.  All xrays were negative for acute fracture. Lung fields clear. No Pneumothorax.  Pain is MSK from fall/injury. Treating with PRN Hydrocodone. Patient to follow up closely with PCP.

## 2015-01-01 ENCOUNTER — Telehealth: Payer: Self-pay | Admitting: Internal Medicine

## 2015-01-01 MED ORDER — CEPHALEXIN 500 MG PO CAPS: 500.0000 mg | ORAL_CAPSULE | Freq: Two times a day (BID) | ORAL | Status: DC

## 2015-01-01 NOTE — Telephone Encounter (Signed)
Pt notified of urine culture results and treat with keflex.  She has taken ceftin previously and did ok with this abx.  rx sent in for keflex.  Pts daughter notified.

## 2015-01-03 ENCOUNTER — Other Ambulatory Visit: Payer: Self-pay | Admitting: Internal Medicine

## 2015-01-14 ENCOUNTER — Ambulatory Visit (INDEPENDENT_AMBULATORY_CARE_PROVIDER_SITE_OTHER): Payer: Medicare Other

## 2015-01-14 DIAGNOSIS — E538 Deficiency of other specified B group vitamins: Secondary | ICD-10-CM | POA: Diagnosis not present

## 2015-01-14 MED ORDER — CYANOCOBALAMIN 1000 MCG/ML IJ SOLN
1000.0000 ug | Freq: Once | INTRAMUSCULAR | Status: AC
  Administered 2015-01-14: 1000 ug via INTRAMUSCULAR

## 2015-01-14 NOTE — Progress Notes (Signed)
Patient came in for B12 injection.  Received in Right deltoid.  Patient tolerated well.  

## 2015-01-20 ENCOUNTER — Other Ambulatory Visit: Payer: Medicare Other

## 2015-01-20 ENCOUNTER — Ambulatory Visit: Payer: Medicare Other

## 2015-01-20 ENCOUNTER — Other Ambulatory Visit: Payer: Self-pay | Admitting: Surgical

## 2015-01-20 MED ORDER — LEVOTHYROXINE SODIUM 50 MCG PO TABS: 50.0000 ug | ORAL_TABLET | Freq: Every day | ORAL | Status: DC

## 2015-01-20 MED ORDER — ALLOPURINOL 100 MG PO TABS: 100.0000 mg | ORAL_TABLET | Freq: Every day | ORAL | Status: DC

## 2015-01-20 NOTE — Telephone Encounter (Signed)
Sent prescriptions to pharmacy with 5 refills

## 2015-01-21 ENCOUNTER — Ambulatory Visit
Admission: RE | Admit: 2015-01-21 | Discharge: 2015-01-21 | Disposition: A | Discharge status: Home / Self Care | Payer: Medicare Other | Source: Ambulatory Visit | Attending: Internal Medicine | Admitting: Internal Medicine

## 2015-01-21 ENCOUNTER — Other Ambulatory Visit: Payer: Self-pay | Admitting: Internal Medicine

## 2015-01-21 DIAGNOSIS — R928 Other abnormal and inconclusive findings on diagnostic imaging of breast: Secondary | ICD-10-CM

## 2015-01-21 DIAGNOSIS — N63 Unspecified lump in breast: Secondary | ICD-10-CM | POA: Insufficient documentation

## 2015-01-22 ENCOUNTER — Other Ambulatory Visit: Payer: Self-pay | Admitting: Internal Medicine

## 2015-01-22 DIAGNOSIS — R928 Other abnormal and inconclusive findings on diagnostic imaging of breast: Secondary | ICD-10-CM

## 2015-01-22 NOTE — Progress Notes (Signed)
Order placed for f/u right breast mammogram.  

## 2015-02-03 ENCOUNTER — Other Ambulatory Visit: Payer: Self-pay

## 2015-02-03 ENCOUNTER — Other Ambulatory Visit: Payer: Self-pay | Admitting: Internal Medicine

## 2015-02-03 MED ORDER — MIRABEGRON ER 25 MG PO TB24: 25.0000 mg | ORAL_TABLET | Freq: Every day | ORAL | Status: DC

## 2015-02-03 MED ORDER — FLUOXETINE HCL 20 MG PO CAPS: 20.0000 mg | ORAL_CAPSULE | Freq: Every day | ORAL | Status: DC

## 2015-02-03 NOTE — Telephone Encounter (Signed)
The allergy contradiction was on the following medication. Please advise?

## 2015-02-03 NOTE — Telephone Encounter (Signed)
Need to confirm what dose of losartan pt is taking.  She should not be taking both  and .  Please confirm dose.  Both on list.  Thanks

## 2015-02-04 NOTE — Telephone Encounter (Signed)
Left message on VM to return call with correct dose of Losartin

## 2015-02-05 MED ORDER — LOSARTAN POTASSIUM 50 MG PO TABS: 50.0000 mg | ORAL_TABLET | Freq: Every day | ORAL | Status: DC

## 2015-02-05 NOTE — Telephone Encounter (Signed)
ok'd refill for losartan  #30 with 3 refills.

## 2015-02-05 NOTE — Telephone Encounter (Signed)
Spoke with pt, she is taking Losartan 50 mg.  Please advise refill

## 2015-02-18 ENCOUNTER — Ambulatory Visit: Payer: Medicare Other

## 2015-02-20 ENCOUNTER — Ambulatory Visit (INDEPENDENT_AMBULATORY_CARE_PROVIDER_SITE_OTHER): Payer: Medicare Other | Admitting: *Deleted

## 2015-02-20 ENCOUNTER — Other Ambulatory Visit: Payer: Self-pay | Admitting: Internal Medicine

## 2015-02-20 DIAGNOSIS — E538 Deficiency of other specified B group vitamins: Secondary | ICD-10-CM

## 2015-02-20 MED ORDER — CYANOCOBALAMIN 1000 MCG/ML IJ SOLN
1000.0000 ug | Freq: Once | INTRAMUSCULAR | Status: AC
  Administered 2015-02-20: 1000 ug via INTRAMUSCULAR

## 2015-02-23 ENCOUNTER — Other Ambulatory Visit: Payer: Self-pay | Admitting: Internal Medicine

## 2015-02-27 ENCOUNTER — Other Ambulatory Visit: Payer: Self-pay | Admitting: Internal Medicine

## 2015-02-28 ENCOUNTER — Other Ambulatory Visit: Payer: Self-pay | Admitting: Internal Medicine

## 2015-02-28 MED ORDER — METHENAMINE HIPPURATE 1 G PO TABS: ORAL_TABLET | ORAL | Status: DC

## 2015-02-28 NOTE — Progress Notes (Signed)
Refilled hiprex #30 with 2 refills.

## 2015-03-11 ENCOUNTER — Other Ambulatory Visit: Payer: Self-pay

## 2015-03-11 MED ORDER — PANTOPRAZOLE SODIUM 40 MG PO TBEC: 40.0000 mg | DELAYED_RELEASE_TABLET | Freq: Every day | ORAL | Status: DC

## 2015-03-18 ENCOUNTER — Other Ambulatory Visit: Payer: Self-pay | Admitting: Internal Medicine

## 2015-03-19 ENCOUNTER — Encounter: Payer: Self-pay | Admitting: Internal Medicine

## 2015-03-24 ENCOUNTER — Ambulatory Visit: Payer: Medicare Other | Admitting: Internal Medicine

## 2015-03-31 ENCOUNTER — Other Ambulatory Visit: Payer: Self-pay | Admitting: Internal Medicine

## 2015-03-31 ENCOUNTER — Ambulatory Visit (INDEPENDENT_AMBULATORY_CARE_PROVIDER_SITE_OTHER): Payer: Medicare Other | Admitting: Internal Medicine

## 2015-03-31 ENCOUNTER — Other Ambulatory Visit: Payer: Self-pay

## 2015-03-31 ENCOUNTER — Encounter: Payer: Self-pay | Admitting: Internal Medicine

## 2015-03-31 VITALS — BP 150/90 | HR 67 | Temp 97.9°F | Resp 16 | Ht 66.25 in | Wt 183.0 lb

## 2015-03-31 DIAGNOSIS — E7800 Pure hypercholesterolemia, unspecified: Secondary | ICD-10-CM

## 2015-03-31 DIAGNOSIS — J449 Chronic obstructive pulmonary disease, unspecified: Secondary | ICD-10-CM | POA: Diagnosis not present

## 2015-03-31 DIAGNOSIS — Z87440 Personal history of urinary (tract) infections: Secondary | ICD-10-CM

## 2015-03-31 DIAGNOSIS — E669 Obesity, unspecified: Secondary | ICD-10-CM

## 2015-03-31 DIAGNOSIS — E538 Deficiency of other specified B group vitamins: Secondary | ICD-10-CM

## 2015-03-31 DIAGNOSIS — I10 Essential (primary) hypertension: Secondary | ICD-10-CM

## 2015-03-31 DIAGNOSIS — R928 Other abnormal and inconclusive findings on diagnostic imaging of breast: Secondary | ICD-10-CM

## 2015-03-31 DIAGNOSIS — E1151 Type 2 diabetes mellitus with diabetic peripheral angiopathy without gangrene: Secondary | ICD-10-CM

## 2015-03-31 DIAGNOSIS — J45909 Unspecified asthma, uncomplicated: Secondary | ICD-10-CM

## 2015-03-31 DIAGNOSIS — K219 Gastro-esophageal reflux disease without esophagitis: Secondary | ICD-10-CM

## 2015-03-31 DIAGNOSIS — N183 Chronic kidney disease, stage 3 unspecified: Secondary | ICD-10-CM

## 2015-03-31 MED ORDER — CYANOCOBALAMIN 1000 MCG/ML IJ SOLN
1000.0000 ug | Freq: Once | INTRAMUSCULAR | Status: AC
  Administered 2015-03-31: 1000 ug via INTRAMUSCULAR

## 2015-03-31 MED ORDER — SIMVASTATIN 20 MG PO TABS: ORAL_TABLET | ORAL | Status: DC

## 2015-03-31 NOTE — Progress Notes (Signed)
Pre-visit discussion using our clinic review tool. No additional management support is needed unless otherwise documented below in the visit note.  

## 2015-03-31 NOTE — Progress Notes (Signed)
[name], female   DOB: Oct 30, 1926, 82 y.o.   MRN: [id number]   Subjective:    [name], female    DOB: 1926/12/19, 81 y.o.   MRN: [id number]  HPI  Patient with past history of hypercholesterolemia, GERD, diabetes and hypertension.  She comes in today for a scheduled follow up.  She is accompanied by her daughter.  History obtained from both of them.  States she is doing well.  Feels better.  Breathing is at baseline.  Some allergy symptoms.  No chest congestion.  No acid reflux.  No abdominal pain or cramping.  Bowels stable.  Had f/u mammogram in 12/2014.  Due f/u diagnostic right breast mammo in 6 months.  Discussed staying hydrated.  She is getting around her house without significant difficulty.     Past Medical History  Diagnosis Date  . Hypertension   . Hypercholesterolemia   . Peripheral vascular disease (HCC)   . GERD (gastroesophageal reflux disease)     hiatal hernia  . History of frequent urinary tract infections   . Diabetes mellitus (HCC)   . History of colon polyps   . Hematuria     followed by urology  . Gout   . Humeral fracture 9/09    comminuted impacted proximal   Past Surgical History  Procedure Laterality Date  . Cholecystectomy  1994  . Bladder suspension  12/08/99  . Breast biopsy Right    Family History  Problem Relation Age of Onset  . Hypertension Father   . Hypertension Mother   . Colitis Mother   . Breast cancer Neg Hx   . Colon cancer Neg Hx    Social History   Social History  . Marital Status: Widowed    Spouse Name: N/A  . Number of Children: 2  . Years of Education: N/A   Social History Main Topics  . Smoking status: Never Smoker   . Smokeless tobacco: Never Used  . Alcohol Use: No  . Drug Use: No  . Sexual Activity: No   Other Topics Concern  . None   Social History Narrative    Outpatient Encounter Prescriptions as of 03/31/2015  Medication Sig  . albuterol (PROVENTIL HFA;VENTOLIN HFA) 108 (90  BASE) MCG/ACT inhaler Inhale 2 puffs into the lungs every 6 (six) hours as needed for wheezing or shortness of breath.  . allopurinol (ZYLOPRIM) 100 MG tablet Take 1 tablet (100 mg total) by mouth daily.  . Calcium Carbonate-Vitamin D (CALCIUM 600+D) 600-400 MG-UNIT per tablet Take 1 tablet by mouth daily.   . cetirizine (ZYRTEC) 10 MG tablet Take 10 mg by mouth daily.  . Cholecalciferol (VITAMIN D-3) 1000 UNITS CAPS Take 1 capsule by mouth daily.  . Cranberry-Cholecalciferol 4200-500 MG-UNIT CAPS Take by mouth.  Marland Kitchen FLUoxetine (PROZAC) 20 MG capsule Take 1 capsule (20 mg total) by mouth daily.  Marland Kitchen levothyroxine (SYNTHROID) 50 MCG tablet Take 1 tablet (50 mcg total) by mouth daily.  Marland Kitchen losartan (COZAAR) 50 MG tablet Take 1 tablet (50 mg total) by mouth daily.  . methenamine (HIPREX) 1 G tablet Take 1 tablet (1 g total) by mouth daily.  . mirabegron ER (MYRBETRIQ) 25 MG TB24 tablet Take 1 tablet (25 mg total) by mouth daily.  . montelukast (SINGULAIR) 10 MG tablet Take 1 tablet (10 mg total) by mouth at bedtime.  . pantoprazole (PROTONIX) 40 MG tablet Take 1 tablet (40 mg total) by mouth daily.  . simvastatin (ZOCOR) 20  MG tablet Take 1 tablet (20 mg total) by mouth daily.  . [DISCONTINUED] Fluticasone-Salmeterol (ADVAIR DISKUS) 500-50 MCG/DOSE AEPB Inhale 1 puff into the lungs 2 (two) times daily.  . [DISCONTINUED] simvastatin (ZOCOR) 20 MG tablet Take 1 tablet (20 mg total) by mouth daily.  . Fluticasone-Salmeterol (ADVAIR DISKUS) 250-50 MCG/DOSE AEPB Inhale 1 puff into the lungs 2 (two) times daily.  . [DISCONTINUED] cephALEXin (KEFLEX) 500 MG capsule Take 1 capsule (500 mg total) by mouth 2 (two) times daily.  . [DISCONTINUED] HYDROcodone-acetaminophen (NORCO/VICODIN) 5-325 MG per tablet Take 1 tablet by mouth every 6 (six) hours as needed for moderate pain.  . [DISCONTINUED] methenamine (HIPREX) 1 G tablet Take 1 tablet (1 g total) by mouth daily.  . [EXPIRED] cyanocobalamin ((VITAMIN B-12))  injection 1,000 mcg    No facility-administered encounter medications on file as of 03/31/2015.    Review of Systems  Constitutional: Negative for appetite change and unexpected weight change.  HENT: Positive for postnasal drip. Negative for sinus pressure.   Eyes: Negative for pain and discharge.  Respiratory: Negative for cough, chest tightness and shortness of breath (breathing is stable.  ).   Cardiovascular: Negative for chest pain, palpitations and leg swelling.  Gastrointestinal: Negative for nausea, vomiting, abdominal pain and diarrhea.  Genitourinary: Negative for dysuria and difficulty urinating.  Musculoskeletal: Negative for back pain and joint swelling.  Skin: Negative for color change and rash.  Neurological: Negative for dizziness, light-headedness and headaches.  Psychiatric/Behavioral: Negative for dysphoric mood and agitation.       Objective:     Blood pressure rechecked by me:  138/68  Physical Exam  Constitutional: She appears well-developed and well-nourished. No distress.  HENT:  Nose: Nose normal.  Mouth/Throat: Oropharynx is clear and moist.  Eyes: Conjunctivae are normal. Right eye exhibits no discharge. Left eye exhibits no discharge.  Neck: Neck supple. No thyromegaly present.  Cardiovascular: Normal rate and regular rhythm.   Pulmonary/Chest: Breath sounds normal. No respiratory distress. She has no wheezes.  Abdominal: Soft. Bowel sounds are normal. There is no tenderness.  Musculoskeletal: She exhibits no edema or tenderness.  Lymphadenopathy:    She has no cervical adenopathy.  Skin: No rash noted. No erythema.  Psychiatric: She has a normal mood and affect. Her behavior is normal.    BP 150/90 mmHg  Pulse 67  Temp(Src) 97.9 F (36.6 C) (Oral)  Resp 16  Ht 5' 6.25" (1.683 m)  Wt 183 lb (83.008 kg)  BMI 29.31 kg/m2  SpO2 98% Wt Readings from Last 3 Encounters:  03/31/15 183 lb (83.008 kg)  12/30/14 167 lb (75.751 kg)  11/21/14 187 lb  6 oz (84.993 kg)     Lab Results  Component Value Date   WBC 9.1 11/19/2014   HGB 11.7* 11/19/2014   HCT 36.4 11/19/2014   PLT 296.0 11/19/2014   GLUCOSE 103* 12/24/2014   CHOL 143 11/19/2014   TRIG 82.0 11/19/2014   HDL 47.10 11/19/2014   LDLCALC 80 11/19/2014   ALT 11 11/19/2014   AST 17 11/19/2014   NA 144 12/24/2014   K 4.3 12/24/2014   CL 109 12/24/2014   CREATININE 1.35* 12/24/2014   BUN 33* 12/24/2014   CO2 28 12/24/2014   TSH 2.27 11/19/2014   HGBA1C 6.3 11/19/2014   MICROALBUR 3.6* 11/21/2014    US Breast Ltd Uni Right Inc Axilla  01/21/2015  CLINICAL DATA:  Follow-up of probably benign right breast 12 o'clock nodule. EXAM: DIGITAL DIAGNOSTIC BILATERAL MAMMOGRAM WITH 3D  TOMOSYNTHESIS WITH CAD ULTRASOUND RIGHT BREAST COMPARISON:  Previous exam(s). ACR Breast Density Category b: There are scattered areas of fibroglandular density. FINDINGS: There are no suspicious masses, areas of architectural distortion or microcalcifications in either breast. Again seen is a few mm low-density circumscribed nodule in the right central breast, anterior depth, which is stable. Postsurgical changes in the upper-outer quadrant of the right breast are also noted. Mammographic images were processed with CAD. On physical exam, no suspicious masses of found. Targeted ultrasound is performed, showing right breast 12 o'clock 1 cm from the nipple hypoechoic circumscribed horizontally oriented nodule which measures 0.4 x 0.3 x 0.5 cm. The sonographic appearance is benign, and stable accounting for differences in measurement technique. It is felt to correspond to the mammographically seen benign-appearing nodule in the central right breast. IMPRESSION: Stable benign-appearing right breast nodule. Continued six-month follow-up is recommended. No mammographic evidence of malignancy in the left breast. RECOMMENDATION: Diagnostic mammogram of the right breast in 6 months. (Code:FI-R-37M) I have discussed the  findings and recommendations with the patient. Results were also provided in writing at the conclusion of the visit. If applicable, a reminder letter will be sent to the patient regarding the next appointment. BI-RADS CATEGORY  3: Probably benign. Electronically Signed   By: Ted Mcalpine M.D.   On: 01/21/2015 18:03   Mm Diag Breast Tomo Bilateral  01/21/2015  CLINICAL DATA:  Follow-up of probably benign right breast 12 o'clock nodule. EXAM: DIGITAL DIAGNOSTIC BILATERAL MAMMOGRAM WITH 3D TOMOSYNTHESIS WITH CAD ULTRASOUND RIGHT BREAST COMPARISON:  Previous exam(s). ACR Breast Density Category b: There are scattered areas of fibroglandular density. FINDINGS: There are no suspicious masses, areas of architectural distortion or microcalcifications in either breast. Again seen is a few mm low-density circumscribed nodule in the right central breast, anterior depth, which is stable. Postsurgical changes in the upper-outer quadrant of the right breast are also noted. Mammographic images were processed with CAD. On physical exam, no suspicious masses of found. Targeted ultrasound is performed, showing right breast 12 o'clock 1 cm from the nipple hypoechoic circumscribed horizontally oriented nodule which measures 0.4 x 0.3 x 0.5 cm. The sonographic appearance is benign, and stable accounting for differences in measurement technique. It is felt to correspond to the mammographically seen benign-appearing nodule in the central right breast. IMPRESSION: Stable benign-appearing right breast nodule. Continued six-month follow-up is recommended. No mammographic evidence of malignancy in the left breast. RECOMMENDATION: Diagnostic mammogram of the right breast in 6 months. (Code:FI-R-37M) I have discussed the findings and recommendations with the patient. Results were also provided in writing at the conclusion of the visit. If applicable, a reminder letter will be sent to the patient regarding the next appointment. BI-RADS  CATEGORY  3: Probably benign. Electronically Signed   By: Ted Mcalpine M.D.   On: 01/21/2015 18:03       Assessment & Plan:   Problem List Items Addressed This Visit    Abnormal mammogram    Had mammogram 01/21/15.  Recommended f/u diagnostic right breast mammo in 6 months - due 06/2015.        Chronic obstructive airway disease with asthma (HCC)    Continue exercise.  Breathing stable.  Follow.       Relevant Medications   Fluticasone-Salmeterol (ADVAIR DISKUS) 250-50 MCG/DOSE AEPB   CKD (chronic kidney disease) stage 3, GFR 30-59 ml/min    Discussed referral to nephrology.  Stay hydrated.  Follow metabolic panel.  Recheck with next labs.  Avoid antiinflammatories.  Diabetes mellitus with peripheral vascular disease (HCC)    Low carb diet and exercise.  Follow metabolic panel and a1c.        Relevant Medications   simvastatin (ZOCOR) 20 MG tablet   Other Relevant Orders   Hemoglobin A1c   Basic metabolic panel   GERD (gastroesophageal reflux disease)    Upper symptoms controlled.  On protonix.       History of frequent urinary tract infections    Has been evaluated by urology.  On hipprex.  Follow.  No problems recently.       Hypercholesterolemia    On simvastatin.  Low cholesterol diet and exercise.  Follow lipid panel and liver function tests.       Relevant Medications   simvastatin (ZOCOR) 20 MG tablet   Other Relevant Orders   Lipid panel   Hepatic function panel   Hypertension    Blood pressure under good control.  Continue same medication regimen.  Follow pressures.  Follow metabolic panel.        Relevant Medications   simvastatin (ZOCOR) 20 MG tablet   Other Relevant Orders   CBC with Differential/Platelet   Obesity (BMI 30.0-34.9)    Diet and exercise.         Other Visit Diagnoses    B12 deficiency    -  Primary    Relevant Medications    cyanocobalamin ((VITAMIN B-12)) injection 1,000 mcg (Completed)        Dale DurhamSCOTT, Felisa Zechman,  MD

## 2015-04-01 MED ORDER — FLUTICASONE-SALMETEROL 250-50 MCG/DOSE IN AEPB: 1.0000 | INHALATION_SPRAY | Freq: Two times a day (BID) | RESPIRATORY_TRACT | Status: DC

## 2015-04-01 NOTE — Assessment & Plan Note (Signed)
Blood pressure under good control.  Continue same medication regimen.  Follow pressures.  Follow metabolic panel.   

## 2015-04-01 NOTE — Assessment & Plan Note (Addendum)
Discussed referral to nephrology.  Stay hydrated.  Follow metabolic panel.  Recheck with next labs.  Avoid antiinflammatories.

## 2015-04-01 NOTE — Assessment & Plan Note (Signed)
Had mammogram 01/21/15.  Recommended f/u diagnostic right breast mammo in 6 months - due 06/2015.

## 2015-04-01 NOTE — Assessment & Plan Note (Signed)
Continue exercise.  Breathing stable.  Follow.

## 2015-04-01 NOTE — Assessment & Plan Note (Signed)
On simvastatin.  Low cholesterol diet and exercise.  Follow lipid panel and liver function tests.   

## 2015-04-01 NOTE — Assessment & Plan Note (Signed)
Low carb diet and exercise.  Follow metabolic panel and a1c.   

## 2015-04-01 NOTE — Assessment & Plan Note (Signed)
Has been evaluated by urology.  On hipprex.  Follow.  No problems recently.

## 2015-04-01 NOTE — Assessment & Plan Note (Signed)
Diet and exercise.   

## 2015-04-01 NOTE — Assessment & Plan Note (Signed)
Upper symptoms controlled.  On protonix.  

## 2015-04-21 ENCOUNTER — Other Ambulatory Visit (INDEPENDENT_AMBULATORY_CARE_PROVIDER_SITE_OTHER): Payer: Medicare Other

## 2015-04-21 DIAGNOSIS — E1151 Type 2 diabetes mellitus with diabetic peripheral angiopathy without gangrene: Secondary | ICD-10-CM | POA: Diagnosis not present

## 2015-04-21 DIAGNOSIS — E7800 Pure hypercholesterolemia, unspecified: Secondary | ICD-10-CM

## 2015-04-21 DIAGNOSIS — I10 Essential (primary) hypertension: Secondary | ICD-10-CM | POA: Diagnosis not present

## 2015-04-21 LAB — CBC WITH DIFFERENTIAL/PLATELET
BASOS PCT: 0.5 % (ref 0.0–3.0)
Basophils Absolute: 0 10*3/uL (ref 0.0–0.1)
EOS PCT: 4.9 % (ref 0.0–5.0)
Eosinophils Absolute: 0.5 10*3/uL (ref 0.0–0.7)
HEMATOCRIT: 38.3 % (ref 36.0–46.0)
HEMOGLOBIN: 12.3 g/dL (ref 12.0–15.0)
LYMPHS PCT: 36.8 % (ref 12.0–46.0)
Lymphs Abs: 4 10*3/uL (ref 0.7–4.0)
MCHC: 32.1 g/dL (ref 30.0–36.0)
MCV: 93.4 fl (ref 78.0–100.0)
MONO ABS: 0.9 10*3/uL (ref 0.1–1.0)
MONOS PCT: 8.5 % (ref 3.0–12.0)
Neutro Abs: 5.3 10*3/uL (ref 1.4–7.7)
Neutrophils Relative %: 49.3 % (ref 43.0–77.0)
Platelets: 300 10*3/uL (ref 150.0–400.0)
RBC: 4.11 Mil/uL (ref 3.87–5.11)
RDW: 15.6 % — AB (ref 11.5–15.5)
WBC: 10.8 10*3/uL — AB (ref 4.0–10.5)

## 2015-04-21 LAB — HEPATIC FUNCTION PANEL
ALT: 11 U/L (ref 0–35)
AST: 19 U/L (ref 0–37)
Albumin: 3.7 g/dL (ref 3.5–5.2)
Alkaline Phosphatase: 75 U/L (ref 39–117)
BILIRUBIN DIRECT: 0.1 mg/dL (ref 0.0–0.3)
BILIRUBIN TOTAL: 0.5 mg/dL (ref 0.2–1.2)
TOTAL PROTEIN: 6.6 g/dL (ref 6.0–8.3)

## 2015-04-21 LAB — BASIC METABOLIC PANEL
BUN: 23 mg/dL (ref 6–23)
CALCIUM: 9.8 mg/dL (ref 8.4–10.5)
CO2: 27 meq/L (ref 19–32)
Chloride: 105 mEq/L (ref 96–112)
Creatinine, Ser: 1.47 mg/dL — ABNORMAL HIGH (ref 0.40–1.20)
GFR: 35.78 mL/min — AB (ref >60.00)
Glucose, Bld: 141 mg/dL — ABNORMAL HIGH (ref 70–99)
POTASSIUM: 4.4 meq/L (ref 3.5–5.1)
SODIUM: 143 meq/L (ref 135–145)

## 2015-04-21 LAB — LIPID PANEL
CHOL/HDL RATIO: 3
CHOLESTEROL: 138 mg/dL (ref 0–200)
HDL: 42 mg/dL (ref >39.00)
LDL CALC: 77 mg/dL (ref 0–99)
NonHDL: 96.22
Triglycerides: 97 mg/dL (ref 0.0–149.0)
VLDL: 19.4 mg/dL (ref 0.0–40.0)

## 2015-04-21 LAB — HEMOGLOBIN A1C: HEMOGLOBIN A1C: 6.1 % (ref 4.6–6.5)

## 2015-04-23 ENCOUNTER — Other Ambulatory Visit: Payer: Self-pay | Admitting: Internal Medicine

## 2015-04-23 ENCOUNTER — Encounter: Payer: Self-pay | Admitting: Internal Medicine

## 2015-04-23 DIAGNOSIS — N183 Chronic kidney disease, stage 3 (moderate): Secondary | ICD-10-CM

## 2015-04-25 NOTE — Telephone Encounter (Signed)
Mailed message.

## 2015-04-27 NOTE — Telephone Encounter (Signed)
Order placed for nephrology referral.   °

## 2015-04-27 NOTE — Addendum Note (Signed)
Addended by: Charm BargesSCOTT, Deletha Jaffee S on: 04/27/2015 09:08 PM   Modules accepted: Orders

## 2015-04-28 ENCOUNTER — Other Ambulatory Visit: Payer: Self-pay | Admitting: Internal Medicine

## 2015-05-01 ENCOUNTER — Ambulatory Visit (INDEPENDENT_AMBULATORY_CARE_PROVIDER_SITE_OTHER): Payer: Medicare Other

## 2015-05-01 DIAGNOSIS — Z23 Encounter for immunization: Secondary | ICD-10-CM | POA: Diagnosis not present

## 2015-05-01 DIAGNOSIS — E538 Deficiency of other specified B group vitamins: Secondary | ICD-10-CM | POA: Diagnosis not present

## 2015-05-01 MED ORDER — CYANOCOBALAMIN 1000 MCG/ML IJ SOLN
1000.0000 ug | Freq: Once | INTRAMUSCULAR | Status: AC
  Administered 2015-05-01: 1000 ug via INTRAMUSCULAR

## 2015-05-01 NOTE — Progress Notes (Signed)
Patient came in for B12 injection.  Patient received in Left deltoid.  Patient tolerated well.  

## 2015-05-20 ENCOUNTER — Other Ambulatory Visit: Payer: Self-pay | Admitting: Internal Medicine

## 2015-05-22 DIAGNOSIS — N183 Chronic kidney disease, stage 3 (moderate): Secondary | ICD-10-CM | POA: Diagnosis not present

## 2015-05-22 DIAGNOSIS — N390 Urinary tract infection, site not specified: Secondary | ICD-10-CM | POA: Diagnosis not present

## 2015-05-22 DIAGNOSIS — I959 Hypotension, unspecified: Secondary | ICD-10-CM | POA: Diagnosis not present

## 2015-05-26 ENCOUNTER — Other Ambulatory Visit: Payer: Self-pay | Admitting: Internal Medicine

## 2015-06-03 ENCOUNTER — Ambulatory Visit: Payer: Medicare Other

## 2015-06-10 ENCOUNTER — Ambulatory Visit (INDEPENDENT_AMBULATORY_CARE_PROVIDER_SITE_OTHER): Payer: Medicare Other

## 2015-06-10 DIAGNOSIS — E538 Deficiency of other specified B group vitamins: Secondary | ICD-10-CM

## 2015-06-10 MED ORDER — CYANOCOBALAMIN 1000 MCG/ML IJ SOLN
1000.0000 ug | Freq: Once | INTRAMUSCULAR | Status: AC
  Administered 2015-06-10: 1000 ug via INTRAMUSCULAR

## 2015-06-10 NOTE — Progress Notes (Signed)
Patient here today for B12 injection. Given in left deltoid, patient tolerated well. 

## 2015-06-16 ENCOUNTER — Other Ambulatory Visit: Payer: Self-pay | Admitting: Internal Medicine

## 2015-06-16 DIAGNOSIS — I959 Hypotension, unspecified: Secondary | ICD-10-CM | POA: Diagnosis not present

## 2015-06-16 DIAGNOSIS — N390 Urinary tract infection, site not specified: Secondary | ICD-10-CM | POA: Diagnosis not present

## 2015-06-16 DIAGNOSIS — N183 Chronic kidney disease, stage 3 (moderate): Secondary | ICD-10-CM | POA: Diagnosis not present

## 2015-06-16 DIAGNOSIS — E1129 Type 2 diabetes mellitus with other diabetic kidney complication: Secondary | ICD-10-CM | POA: Diagnosis not present

## 2015-07-04 ENCOUNTER — Encounter: Payer: Self-pay | Admitting: Internal Medicine

## 2015-07-07 ENCOUNTER — Other Ambulatory Visit: Payer: Self-pay | Admitting: Internal Medicine

## 2015-07-15 ENCOUNTER — Ambulatory Visit (INDEPENDENT_AMBULATORY_CARE_PROVIDER_SITE_OTHER): Payer: Medicare Other | Admitting: *Deleted

## 2015-07-15 DIAGNOSIS — E538 Deficiency of other specified B group vitamins: Secondary | ICD-10-CM

## 2015-07-15 MED ORDER — CYANOCOBALAMIN 1000 MCG/ML IJ SOLN
1000.0000 ug | Freq: Once | INTRAMUSCULAR | Status: AC
  Administered 2015-07-15: 1000 ug via INTRAMUSCULAR

## 2015-07-22 DIAGNOSIS — H353131 Nonexudative age-related macular degeneration, bilateral, early dry stage: Secondary | ICD-10-CM | POA: Diagnosis not present

## 2015-07-23 ENCOUNTER — Other Ambulatory Visit: Payer: Self-pay | Admitting: Internal Medicine

## 2015-07-23 ENCOUNTER — Ambulatory Visit
Admission: RE | Admit: 2015-07-23 | Discharge: 2015-07-23 | Disposition: A | Discharge status: Home / Self Care | Payer: Medicare Other | Source: Ambulatory Visit | Attending: Internal Medicine | Admitting: Internal Medicine

## 2015-07-23 DIAGNOSIS — R928 Other abnormal and inconclusive findings on diagnostic imaging of breast: Secondary | ICD-10-CM

## 2015-07-23 DIAGNOSIS — N63 Unspecified lump in breast: Secondary | ICD-10-CM | POA: Insufficient documentation

## 2015-07-24 ENCOUNTER — Other Ambulatory Visit: Payer: Self-pay | Admitting: Internal Medicine

## 2015-07-24 ENCOUNTER — Encounter: Payer: Self-pay | Admitting: Internal Medicine

## 2015-07-24 DIAGNOSIS — R928 Other abnormal and inconclusive findings on diagnostic imaging of breast: Secondary | ICD-10-CM

## 2015-07-24 NOTE — Progress Notes (Signed)
Order placed for diagnostic mammogram and ultrasound (right breast) 

## 2015-07-31 ENCOUNTER — Other Ambulatory Visit: Payer: Self-pay | Admitting: Internal Medicine

## 2015-07-31 ENCOUNTER — Ambulatory Visit (INDEPENDENT_AMBULATORY_CARE_PROVIDER_SITE_OTHER): Payer: Medicare Other | Admitting: Internal Medicine

## 2015-07-31 ENCOUNTER — Encounter: Payer: Self-pay | Admitting: Internal Medicine

## 2015-07-31 VITALS — BP 118/72 | HR 89 | Temp 97.7°F | Wt 181.4 lb

## 2015-07-31 DIAGNOSIS — N183 Chronic kidney disease, stage 3 unspecified: Secondary | ICD-10-CM

## 2015-07-31 DIAGNOSIS — J45909 Unspecified asthma, uncomplicated: Secondary | ICD-10-CM

## 2015-07-31 DIAGNOSIS — K219 Gastro-esophageal reflux disease without esophagitis: Secondary | ICD-10-CM

## 2015-07-31 DIAGNOSIS — E669 Obesity, unspecified: Secondary | ICD-10-CM

## 2015-07-31 DIAGNOSIS — J449 Chronic obstructive pulmonary disease, unspecified: Secondary | ICD-10-CM | POA: Diagnosis not present

## 2015-07-31 DIAGNOSIS — I10 Essential (primary) hypertension: Secondary | ICD-10-CM | POA: Diagnosis not present

## 2015-07-31 DIAGNOSIS — E7800 Pure hypercholesterolemia, unspecified: Secondary | ICD-10-CM

## 2015-07-31 DIAGNOSIS — R928 Other abnormal and inconclusive findings on diagnostic imaging of breast: Secondary | ICD-10-CM

## 2015-07-31 DIAGNOSIS — D72829 Elevated white blood cell count, unspecified: Secondary | ICD-10-CM

## 2015-07-31 DIAGNOSIS — E1151 Type 2 diabetes mellitus with diabetic peripheral angiopathy without gangrene: Secondary | ICD-10-CM

## 2015-07-31 DIAGNOSIS — I739 Peripheral vascular disease, unspecified: Secondary | ICD-10-CM

## 2015-07-31 DIAGNOSIS — G4710 Hypersomnia, unspecified: Secondary | ICD-10-CM

## 2015-07-31 DIAGNOSIS — R400 Somnolence: Secondary | ICD-10-CM

## 2015-07-31 MED ORDER — SOLIFENACIN SUCCINATE 5 MG PO TABS: 5.0000 mg | ORAL_TABLET | Freq: Every day | ORAL | Status: DC

## 2015-07-31 NOTE — Progress Notes (Signed)
[name], female   DOB: 02-Sep-1926, 82 y.o.   MRN: [id number]   Subjective:    [name], female    DOB: 02-Sep-1926, 82 y.o.   MRN: [id number]  HPI  Patient with past history of hypercholesterolemia, GERD, PVD, diabetes and hypertension.  She comes in today for a scheduled follow up.  She is accompanied by her daughter.  History obtained from both of them.  She reports her breathing has been stable.  Some cough recently, but overall stable.  Feels better today.  She is off losartan.  Blood pressure doing well off.  No chest pain.  No chest tightness.  She reports feeling fatigued.  Does not feel rested when she wakes up.  Can fall asleep easily if sits still.  Discussed sleep study.  She declines.     Past Medical History  Diagnosis Date  . Hypertension   . Hypercholesterolemia   . Peripheral vascular disease (HCC)   . GERD (gastroesophageal reflux disease)     hiatal hernia  . History of frequent urinary tract infections   . Diabetes mellitus (HCC)   . History of colon polyps   . Hematuria     followed by urology  . Gout   . Humeral fracture 9/09    comminuted impacted proximal   Past Surgical History  Procedure Laterality Date  . Cholecystectomy  1994  . Bladder suspension  12/08/99  . Breast biopsy Right 1960's   Family History  Problem Relation Age of Onset  . Hypertension Father   . Hypertension Mother   . Colitis Mother   . Breast cancer Neg Hx   . Colon cancer Neg Hx    Social History   Social History  . Marital Status: Widowed    Spouse Name: N/A  . Number of Children: 2  . Years of Education: N/A   Social History Main Topics  . Smoking status: Never Smoker   . Smokeless tobacco: Never Used  . Alcohol Use: No  . Drug Use: No  . Sexual Activity: No   Other Topics Concern  . None   Social History Narrative    Outpatient Encounter Prescriptions as of 07/31/2015  Medication Sig  . albuterol (PROVENTIL HFA;VENTOLIN HFA) 108  (90 BASE) MCG/ACT inhaler Inhale 2 puffs into the lungs every 6 (six) hours as needed for wheezing or shortness of breath.  . allopurinol (ZYLOPRIM) 100 MG tablet Take 1 tablet (100 mg total) by mouth daily.  . Calcium Carbonate-Vitamin D (CALCIUM 600+D) 600-400 MG-UNIT per tablet Take 1 tablet by mouth daily.   . cetirizine (ZYRTEC) 10 MG tablet Take 10 mg by mouth daily.  . Cholecalciferol (VITAMIN D-3) 1000 UNITS CAPS Take 1 capsule by mouth daily.  . Cranberry-Cholecalciferol 4200-500 MG-UNIT CAPS Take by mouth.  Marland Kitchen. FLUoxetine (PROZAC) 20 MG capsule Take 1 capsule (20 mg total) by mouth daily.  . Fluticasone-Salmeterol (ADVAIR DISKUS) 250-50 MCG/DOSE AEPB Inhale 1 puff into the lungs 2 (two) times daily.  Marland Kitchen. levothyroxine (SYNTHROID, LEVOTHROID) 50 MCG tablet Take 1 tablet (50 mcg total) by mouth daily.  . methenamine (HIPREX) 1 g tablet Take 1 tablet (1 g total) by mouth daily.  . montelukast (SINGULAIR) 10 MG tablet Take 1 tablet (10 mg total) by mouth at bedtime.  . pantoprazole (PROTONIX) 40 MG tablet Take 1 tablet (40 mg total) by mouth daily.  . simvastatin (ZOCOR) 20 MG tablet Take 1 tablet (20 mg total) by mouth daily.  . [  DISCONTINUED] MYRBETRIQ 25 MG TB24 tablet Take 1 tablet (25 mg total) by mouth daily.  . solifenacin (VESICARE) 5 MG tablet Take 1 tablet (5 mg total) by mouth daily.  . [DISCONTINUED] losartan (COZAAR) 50 MG tablet Take 1 tablet (50 mg total) by mouth daily. (Patient not taking: Reported on 07/31/2015)   No facility-administered encounter medications on file as of 07/31/2015.    Review of Systems  Constitutional: Negative for appetite change and unexpected weight change.  HENT: Negative for congestion and sinus pressure.   Respiratory: Negative for cough (no signifcant cough), chest tightness and shortness of breath.   Cardiovascular: Negative for chest pain, palpitations and leg swelling.  Gastrointestinal: Negative for nausea, vomiting, abdominal pain and  diarrhea.  Genitourinary: Negative for dysuria and difficulty urinating.  Musculoskeletal: Negative for back pain and joint swelling.  Skin: Negative for color change and rash.  Neurological: Negative for dizziness, light-headedness and headaches.  Psychiatric/Behavioral: Negative for dysphoric mood and agitation.       Objective:    Physical Exam  Constitutional: She appears well-developed and well-nourished. No distress.  HENT:  Nose: Nose normal.  Mouth/Throat: Oropharynx is clear and moist.  Eyes: Conjunctivae are normal. Right eye exhibits no discharge. Left eye exhibits no discharge.  Neck: Neck supple. No thyromegaly present.  Cardiovascular: Normal rate and regular rhythm.   Pulmonary/Chest: Breath sounds normal. No respiratory distress. She has no wheezes.  Abdominal: Soft. Bowel sounds are normal. There is no tenderness.  Musculoskeletal: She exhibits no edema or tenderness.  Lymphadenopathy:    She has no cervical adenopathy.  Skin: No rash noted. No erythema.  Psychiatric: She has a normal mood and affect. Her behavior is normal.    BP 118/72 mmHg  Pulse 89  Temp(Src) 97.7 F (36.5 C) (Oral)  Wt 181 lb 6 oz (82.271 kg)  SpO2 94% Wt Readings from Last 3 Encounters:  07/31/15 181 lb 6 oz (82.271 kg)  03/31/15 183 lb (83.008 kg)  12/30/14 167 lb (75.751 kg)     Lab Results  Component Value Date   WBC 10.8* 04/21/2015   HGB 12.3 04/21/2015   HCT 38.3 04/21/2015   PLT 300.0 04/21/2015   GLUCOSE 141* 04/21/2015   CHOL 138 04/21/2015   TRIG 97.0 04/21/2015   HDL 42.00 04/21/2015   LDLCALC 77 04/21/2015   ALT 11 04/21/2015   AST 19 04/21/2015   NA 143 04/21/2015   K 4.4 04/21/2015   CL 105 04/21/2015   CREATININE 1.47* 04/21/2015   BUN 23 04/21/2015   CO2 27 04/21/2015   TSH 2.27 11/19/2014   HGBA1C 6.1 04/21/2015   MICROALBUR 3.6* 11/21/2014    US Breast Ltd Uni Right Inc Axilla  07/23/2015  CLINICAL DATA:  Short-term interval followup of a  probable benign nodule in the right breast. EXAM: DIGITAL DIAGNOSTIC RIGHT MAMMOGRAM WITH 3D TOMOSYNTHESIS WITH CAD ULTRASOUND RIGHT BREAST COMPARISON:  Previous exam(s). ACR Breast Density Category b: There are scattered areas of fibroglandular density. FINDINGS: 4 mm nodule in the 12 o'clock region of the right breast is stable when compared to prior exams dating back to 01/16/2014. No new suspicious mass or malignant type microcalcifications identified. Mammographic images were processed with CAD. On physical exam, I do not palpate a discrete mass in the 12 o'clock region the right breast. Targeted ultrasound is performed, showing a well-circumscribed hypoechoic lesion in the right breast at 12 o'clock 1 cm from the nipple measuring 4 x 3 x 4 mm. On the  prior ultrasound dated 07/30/2014 it measured 4 x 3 x 3 mm. It is felt to likely be benign. IMPRESSION: Probable benign nodule in the right breast. RECOMMENDATION: Short-term interval follow-up bilateral mammogram and right breast ultrasound in August of 2017 is recommended. I have discussed the findings and recommendations with the patient. Results were also provided in writing at the conclusion of the visit. If applicable, a reminder letter will be sent to the patient regarding the next appointment. BI-RADS CATEGORY  3: Probably benign. Electronically Signed   By: Baird Lyons M.D.   On: 07/23/2015 14:14   Mm Diag Breast Tomo Uni Right  07/23/2015  CLINICAL DATA:  Short-term interval followup of a probable benign nodule in the right breast. EXAM: DIGITAL DIAGNOSTIC RIGHT MAMMOGRAM WITH 3D TOMOSYNTHESIS WITH CAD ULTRASOUND RIGHT BREAST COMPARISON:  Previous exam(s). ACR Breast Density Category b: There are scattered areas of fibroglandular density. FINDINGS: 4 mm nodule in the 12 o'clock region of the right breast is stable when compared to prior exams dating back to 01/16/2014. No new suspicious mass or malignant type microcalcifications identified.  Mammographic images were processed with CAD. On physical exam, I do not palpate a discrete mass in the 12 o'clock region the right breast. Targeted ultrasound is performed, showing a well-circumscribed hypoechoic lesion in the right breast at 12 o'clock 1 cm from the nipple measuring 4 x 3 x 4 mm. On the prior ultrasound dated 07/30/2014 it measured 4 x 3 x 3 mm. It is felt to likely be benign. IMPRESSION: Probable benign nodule in the right breast. RECOMMENDATION: Short-term interval follow-up bilateral mammogram and right breast ultrasound in August of 2017 is recommended. I have discussed the findings and recommendations with the patient. Results were also provided in writing at the conclusion of the visit. If applicable, a reminder letter will be sent to the patient regarding the next appointment. BI-RADS CATEGORY  3: Probably benign. Electronically Signed   By: Baird Lyons M.D.   On: 07/23/2015 14:14       Assessment & Plan:   Problem List Items Addressed This Visit    Abnormal mammogram    Mammogram results reviewed.  Recommended 6 month follow up.  She declines further mammograms.        Chronic obstructive airway disease with asthma (HCC)    Breathing stable.  Continue exercise.  Follow.       CKD (chronic kidney disease) stage 3, GFR 30-59 ml/min    Seeing nephrology.  Stay hydrated.  Avoid antiinflammatories.        Daytime somnolence    Increased fatigue.  Increased daytime somnolence.  Does not feel rested when awakens.  Discussed the need for sleep study.  She declines.  Discussed risk of untreated sleep apnea.  She continues to decline.        Diabetes mellitus with peripheral vascular disease (HCC) - Primary    Low carb diet and exercise.  Follow metabolic panel and a1c.        Relevant Orders   Hemoglobin A1c   Basic metabolic panel   GERD (gastroesophageal reflux disease)    On protonix.  Upper symptoms controlled.        Hypercholesterolemia    On simvastatin.   Low cholesterol diet and exercise.  Follow lipid panel and liver function tests.        Relevant Orders   Lipid panel   Hepatic function panel   Hypertension    Blood pressure under good control.  On no medication.  Follow pressures.  Follow metabolic panel.         Leukocytosis    Recheck cbc with next labs.       Relevant Orders   CBC with Differential/Platelet   Obesity (BMI 30.0-34.9)    Diet and exercise.        Peripheral vascular disease (HCC)    Varying pressures in each arm.  Stable.            Dale Meggett, MD

## 2015-07-31 NOTE — Progress Notes (Signed)
Pre visit review using our clinic review tool, if applicable. No additional management support is needed unless otherwise documented below in the visit note. 

## 2015-07-31 NOTE — Progress Notes (Signed)
Order placed for left breast ultrasound.  

## 2015-08-01 ENCOUNTER — Telehealth: Payer: Self-pay

## 2015-08-01 NOTE — Telephone Encounter (Signed)
PA for Vesicare completed on cover my meds

## 2015-08-03 DIAGNOSIS — R400 Somnolence: Secondary | ICD-10-CM | POA: Insufficient documentation

## 2015-08-03 NOTE — Assessment & Plan Note (Signed)
Mammogram results reviewed.  Recommended 6 month follow up.  She declines further mammograms.

## 2015-08-03 NOTE — Assessment & Plan Note (Signed)
Recheck cbc with next labs.   

## 2015-08-03 NOTE — Assessment & Plan Note (Signed)
Blood pressure under good control.  On no medication.  Follow pressures.  Follow metabolic panel.   

## 2015-08-03 NOTE — Assessment & Plan Note (Signed)
Low carb diet and exercise.  Follow metabolic panel and a1c.   

## 2015-08-03 NOTE — Assessment & Plan Note (Signed)
Seeing nephrology.  Stay hydrated.  Avoid antiinflammatories.

## 2015-08-03 NOTE — Assessment & Plan Note (Signed)
On protonix.  Upper symptoms controlled.  

## 2015-08-03 NOTE — Assessment & Plan Note (Signed)
Diet and exercise.   

## 2015-08-03 NOTE — Assessment & Plan Note (Signed)
Increased fatigue.  Increased daytime somnolence.  Does not feel rested when awakens.  Discussed the need for sleep study.  She declines.  Discussed risk of untreated sleep apnea.  She continues to decline.

## 2015-08-03 NOTE — Assessment & Plan Note (Signed)
Varying pressures in each arm.  Stable.

## 2015-08-03 NOTE — Assessment & Plan Note (Signed)
On simvastatin.  Low cholesterol diet and exercise.  Follow lipid panel and liver function tests.   

## 2015-08-03 NOTE — Assessment & Plan Note (Signed)
Breathing stable.  Continue exercise.  Follow.

## 2015-08-08 ENCOUNTER — Other Ambulatory Visit (INDEPENDENT_AMBULATORY_CARE_PROVIDER_SITE_OTHER): Payer: Medicare Other

## 2015-08-08 ENCOUNTER — Other Ambulatory Visit: Payer: Medicare Other

## 2015-08-08 DIAGNOSIS — E7800 Pure hypercholesterolemia, unspecified: Secondary | ICD-10-CM

## 2015-08-08 DIAGNOSIS — E1151 Type 2 diabetes mellitus with diabetic peripheral angiopathy without gangrene: Secondary | ICD-10-CM

## 2015-08-08 DIAGNOSIS — D72829 Elevated white blood cell count, unspecified: Secondary | ICD-10-CM | POA: Diagnosis not present

## 2015-08-08 LAB — BASIC METABOLIC PANEL
BUN: 30 mg/dL — AB (ref 6–23)
CHLORIDE: 103 meq/L (ref 96–112)
CO2: 30 mEq/L (ref 19–32)
CREATININE: 1.39 mg/dL — AB (ref 0.40–1.20)
Calcium: 9.6 mg/dL (ref 8.4–10.5)
GFR: 38.14 mL/min — ABNORMAL LOW (ref >60.00)
GLUCOSE: 103 mg/dL — AB (ref 70–99)
POTASSIUM: 4.8 meq/L (ref 3.5–5.1)
Sodium: 141 mEq/L (ref 135–145)

## 2015-08-08 LAB — HEPATIC FUNCTION PANEL
ALK PHOS: 68 U/L (ref 39–117)
ALT: 10 U/L (ref 0–35)
AST: 17 U/L (ref 0–37)
Albumin: 3.9 g/dL (ref 3.5–5.2)
BILIRUBIN DIRECT: 0.1 mg/dL (ref 0.0–0.3)
TOTAL PROTEIN: 6.8 g/dL (ref 6.0–8.3)
Total Bilirubin: 0.5 mg/dL (ref 0.2–1.2)

## 2015-08-08 LAB — CBC WITH DIFFERENTIAL/PLATELET
BASOS ABS: 0 10*3/uL (ref 0.0–0.1)
BASOS PCT: 0.4 % (ref 0.0–3.0)
EOS ABS: 0.5 10*3/uL (ref 0.0–0.7)
Eosinophils Relative: 4.1 % (ref 0.0–5.0)
HEMATOCRIT: 37.7 % (ref 36.0–46.0)
Hemoglobin: 12.3 g/dL (ref 12.0–15.0)
LYMPHS ABS: 3 10*3/uL (ref 0.7–4.0)
LYMPHS PCT: 25.3 % (ref 12.0–46.0)
MCHC: 32.5 g/dL (ref 30.0–36.0)
MCV: 90.3 fl (ref 78.0–100.0)
MONO ABS: 0.9 10*3/uL (ref 0.1–1.0)
Monocytes Relative: 7.5 % (ref 3.0–12.0)
NEUTROS ABS: 7.3 10*3/uL (ref 1.4–7.7)
NEUTROS PCT: 62.7 % (ref 43.0–77.0)
PLATELETS: 291 10*3/uL (ref 150.0–400.0)
RBC: 4.17 Mil/uL (ref 3.87–5.11)
RDW: 15.5 % (ref 11.5–15.5)
WBC: 11.7 10*3/uL — ABNORMAL HIGH (ref 4.0–10.5)

## 2015-08-08 LAB — LIPID PANEL
CHOL/HDL RATIO: 3
Cholesterol: 149 mg/dL (ref 0–200)
HDL: 47.8 mg/dL (ref >39.00)
LDL Cholesterol: 79 mg/dL (ref 0–99)
NonHDL: 100.77
TRIGLYCERIDES: 110 mg/dL (ref 0.0–149.0)
VLDL: 22 mg/dL (ref 0.0–40.0)

## 2015-08-08 LAB — HEMOGLOBIN A1C: Hgb A1c MFr Bld: 6.4 % (ref 4.6–6.5)

## 2015-08-08 MED ORDER — TOLTERODINE TARTRATE 2 MG PO TABS: 2.0000 mg | ORAL_TABLET | Freq: Two times a day (BID) | ORAL | Status: DC

## 2015-08-08 NOTE — Addendum Note (Signed)
Addended by: Acey LavOMAN, TANYA M on: 08/08/2015 01:41 PM   Modules accepted: Orders

## 2015-08-08 NOTE — Telephone Encounter (Signed)
Sent in prescription,  Called patient.

## 2015-08-08 NOTE — Telephone Encounter (Signed)
See if she has tried detrol (tolterodine).  If no, then can send in rx for detrol 2mg  bid #60 with 2 refills.

## 2015-08-08 NOTE — Telephone Encounter (Signed)
Patient came in the office today, she had labs drawn and wanted to talk to someone about her PA that was denied.    PA- denial received today.  Plan formulary alternatives needed to have been tried listed are : oxybutynin, tolterodine, trospium and toviaz.    Patient wants to try one of those as the other (Vesicare) per the pharmacy will be over $300 if it was approved.    Please advise?

## 2015-08-09 ENCOUNTER — Encounter: Payer: Self-pay | Admitting: Internal Medicine

## 2015-08-13 ENCOUNTER — Ambulatory Visit: Payer: Medicare Other

## 2015-08-13 ENCOUNTER — Ambulatory Visit (INDEPENDENT_AMBULATORY_CARE_PROVIDER_SITE_OTHER): Payer: Medicare Other | Admitting: *Deleted

## 2015-08-13 DIAGNOSIS — E538 Deficiency of other specified B group vitamins: Secondary | ICD-10-CM

## 2015-08-13 MED ORDER — CYANOCOBALAMIN 1000 MCG/ML IJ SOLN
1000.0000 ug | Freq: Once | INTRAMUSCULAR | Status: AC
  Administered 2015-08-13: 1000 ug via INTRAMUSCULAR

## 2015-08-26 ENCOUNTER — Other Ambulatory Visit: Payer: Self-pay | Admitting: Internal Medicine

## 2015-08-27 NOTE — Telephone Encounter (Signed)
ok'd methenamine #30 with one refill.

## 2015-08-27 NOTE — Telephone Encounter (Signed)
Okay to refill? Last seen on: 07/31/15

## 2015-08-29 ENCOUNTER — Other Ambulatory Visit: Payer: Self-pay | Admitting: Internal Medicine

## 2015-09-01 ENCOUNTER — Telehealth: Payer: Self-pay | Admitting: Internal Medicine

## 2015-09-01 NOTE — Telephone Encounter (Signed)
Patient Name: Daisy LazarMARIE Trenkamp  DOB: [birth year]    Initial Comment Caller states been having chest pain all night   Nurse Assessment  Nurse: Deatra JamesNoe, RN, Corrie DandyMary Date/Time (Eastern Time): 09/01/2015 9:47:55 AM  Confirm and document reason for call. If symptomatic, describe symptoms. You must click the next button to save text entered. ---Patient states she is having chest pain all night in the middle of her chest and it feels tight. Patient is wheezing this morning.  Has the patient traveled out of the country within the last 30 days? ---No  Does the patient have any new or worsening symptoms? ---Yes  Will a triage be completed? ---Yes  Related visit to physician within the last 2 weeks? ---No  Does the PT have any chronic conditions? (i.e. diabetes, asthma, etc.) ---Yes  List chronic conditions. ---"COPD, asthma,  Is this a behavioral health or substance abuse call? ---No     Guidelines    Guideline Title Affirmed Question Affirmed Notes  Chest Pain [1] Chest pain lasts > 5 minutes AND [2] described as crushing, pressure-like, or heavy    Final Disposition User   Call EMS 911 Now Deatra JamesNoe, RN, Corrie DandyMary    Disagree/Comply: Comply   Called back for 911 outcome and patient states she is going to take some medicine for indigestion first and if that doesn't work she will have her daughter take her into be seen

## 2015-09-02 NOTE — Telephone Encounter (Signed)
Attempted to call patient to check on her.  Left a VM to return my call.

## 2015-09-16 ENCOUNTER — Ambulatory Visit (INDEPENDENT_AMBULATORY_CARE_PROVIDER_SITE_OTHER): Payer: Medicare Other

## 2015-09-16 DIAGNOSIS — E538 Deficiency of other specified B group vitamins: Secondary | ICD-10-CM | POA: Diagnosis not present

## 2015-09-16 MED ORDER — CYANOCOBALAMIN 1000 MCG/ML IJ SOLN
1000.0000 ug | Freq: Once | INTRAMUSCULAR | Status: AC
  Administered 2015-09-16: 1000 ug via INTRAMUSCULAR

## 2015-09-16 NOTE — Progress Notes (Signed)
Patient came in for b12 injection.  Received in Right deltoid.  Patient tolerated well.  

## 2015-09-19 ENCOUNTER — Telehealth: Payer: Self-pay | Admitting: Internal Medicine

## 2015-09-19 NOTE — Telephone Encounter (Signed)
Left msg to call office to schedule AWV with Denisa/msn °

## 2015-10-13 DIAGNOSIS — I959 Hypotension, unspecified: Secondary | ICD-10-CM | POA: Diagnosis not present

## 2015-10-13 DIAGNOSIS — N183 Chronic kidney disease, stage 3 (moderate): Secondary | ICD-10-CM | POA: Diagnosis not present

## 2015-10-13 DIAGNOSIS — N390 Urinary tract infection, site not specified: Secondary | ICD-10-CM | POA: Diagnosis not present

## 2015-10-14 LAB — BASIC METABOLIC PANEL
BUN: 18 mg/dL (ref 4–21)
CREATININE: 1.5 mg/dL — AB (ref 0.5–1.1)
Glucose: 163 mg/dL
POTASSIUM: 4.5 mmol/L (ref 3.4–5.3)
SODIUM: 141 mmol/L (ref 137–147)

## 2015-10-14 LAB — CBC AND DIFFERENTIAL
HCT: 37 % (ref 36–46)
HEMOGLOBIN: 11.6 g/dL — AB (ref 12.0–16.0)
Neutrophils Absolute: 9 /uL
PLATELETS: 277 10*3/uL (ref 150–399)
WBC: 11.6 10^3/mL

## 2015-10-16 ENCOUNTER — Ambulatory Visit: Payer: Medicare Other

## 2015-10-22 ENCOUNTER — Encounter: Payer: Self-pay | Admitting: Internal Medicine

## 2015-10-31 ENCOUNTER — Ambulatory Visit: Payer: Medicare Other | Admitting: Internal Medicine

## 2015-10-31 ENCOUNTER — Ambulatory Visit (INDEPENDENT_AMBULATORY_CARE_PROVIDER_SITE_OTHER): Payer: Medicare Other | Admitting: Internal Medicine

## 2015-10-31 ENCOUNTER — Encounter: Payer: Self-pay | Admitting: Internal Medicine

## 2015-10-31 VITALS — BP 120/80 | HR 85 | Temp 98.3°F | Resp 18 | Ht 66.25 in | Wt 182.2 lb

## 2015-10-31 DIAGNOSIS — I739 Peripheral vascular disease, unspecified: Secondary | ICD-10-CM

## 2015-10-31 DIAGNOSIS — I10 Essential (primary) hypertension: Secondary | ICD-10-CM | POA: Diagnosis not present

## 2015-10-31 DIAGNOSIS — N183 Chronic kidney disease, stage 3 unspecified: Secondary | ICD-10-CM

## 2015-10-31 DIAGNOSIS — J449 Chronic obstructive pulmonary disease, unspecified: Secondary | ICD-10-CM

## 2015-10-31 DIAGNOSIS — R928 Other abnormal and inconclusive findings on diagnostic imaging of breast: Secondary | ICD-10-CM

## 2015-10-31 DIAGNOSIS — E669 Obesity, unspecified: Secondary | ICD-10-CM

## 2015-10-31 DIAGNOSIS — Z87440 Personal history of urinary (tract) infections: Secondary | ICD-10-CM

## 2015-10-31 DIAGNOSIS — G4710 Hypersomnia, unspecified: Secondary | ICD-10-CM

## 2015-10-31 DIAGNOSIS — J45909 Unspecified asthma, uncomplicated: Secondary | ICD-10-CM

## 2015-10-31 DIAGNOSIS — E7800 Pure hypercholesterolemia, unspecified: Secondary | ICD-10-CM | POA: Diagnosis not present

## 2015-10-31 DIAGNOSIS — E559 Vitamin D deficiency, unspecified: Secondary | ICD-10-CM

## 2015-10-31 DIAGNOSIS — R29898 Other symptoms and signs involving the musculoskeletal system: Secondary | ICD-10-CM

## 2015-10-31 DIAGNOSIS — E1151 Type 2 diabetes mellitus with diabetic peripheral angiopathy without gangrene: Secondary | ICD-10-CM

## 2015-10-31 DIAGNOSIS — R600 Localized edema: Secondary | ICD-10-CM

## 2015-10-31 DIAGNOSIS — R400 Somnolence: Secondary | ICD-10-CM

## 2015-10-31 NOTE — Progress Notes (Signed)
Pre-visit discussion using our clinic review tool. No additional management support is needed unless otherwise documented below in the visit note.  

## 2015-10-31 NOTE — Progress Notes (Signed)
[name], female   DOB: 03/22/1927, 82 y.o.   MRN: [id number]   Subjective:    [name], female    DOB: January 29, 1927, 82 y.o.   MRN: [id number]  HPI  Patient here for a scheduled follow up.  She is accompanied by her daughter.  History obtained from  both of them.  She reports increased fatigue.  Discussed her sleep.  She goes to sleep around 2-3:00am.  Wakes up at 10-11:00.  Only eats two meals.  Does some snacking during the day.  Does not feel rested.  No chest pain.  Breathing overall relatively stable.  Some increased cough and congestion.  Not using her advair twice a day.  Averages q day.  She is going to therapy.  Discussed this at length with her today.  Discussed discussing with the therapist, desire for therapy.     Past Medical History  Diagnosis Date  . Hypertension   . Hypercholesterolemia   . Peripheral vascular disease ([place])   . GERD (gastroesophageal reflux disease)     hiatal hernia  . History of frequent urinary tract infections   . Diabetes mellitus ([place])   . History of colon polyps   . Hematuria     followed by urology  . Gout   . Humeral fracture 9/09    comminuted impacted proximal   Past Surgical History  Procedure Laterality Date  . Cholecystectomy  1994  . Bladder suspension  12/08/99  . Breast biopsy Right 1960's   Family History  Problem Relation Age of Onset  . Hypertension Father   . Hypertension Mother   . Colitis Mother   . Breast cancer Neg Hx   . Colon cancer Neg Hx    Social History   Social History  . Marital Status: Widowed    Spouse Name: N/A  . Number of Children: 2  . Years of Education: N/A   Social History Main Topics  . Smoking status: Never Smoker   . Smokeless tobacco: Never Used  . Alcohol Use: No  . Drug Use: No  . Sexual Activity: No   Other Topics Concern  . None   Social History Narrative    Outpatient Encounter Prescriptions as of 10/31/2015  Medication Sig  . albuterol  (PROVENTIL HFA;VENTOLIN HFA) 108 (90 BASE) MCG/ACT inhaler Inhale 2 puffs into the lungs every 6 (six) hours as needed for wheezing or shortness of breath.  . allopurinol (ZYLOPRIM) 100 MG tablet Take 1 tablet (100 mg total) by mouth daily.  . Calcium Carbonate-Vitamin D (CALCIUM 600+D) 600-400 MG-UNIT per tablet Take 1 tablet by mouth daily.   . cetirizine (ZYRTEC) 10 MG tablet Take 10 mg by mouth daily.  . Cholecalciferol (VITAMIN D-3) 1000 UNITS CAPS Take 1 capsule by mouth daily.  . Cranberry-Cholecalciferol 4200-500 MG-UNIT CAPS Take by mouth.  Marland Kitchen FLUoxetine (PROZAC) 20 MG capsule Take 1 capsule (20 mg total) by mouth daily.  . Fluticasone-Salmeterol (ADVAIR DISKUS) 250-50 MCG/DOSE AEPB Inhale 1 puff into the lungs 2 (two) times daily.  Marland Kitchen levothyroxine (SYNTHROID, LEVOTHROID) 50 MCG tablet Take 1 tablet (50 mcg total) by mouth daily.  . methenamine (HIPREX) 1 g tablet Take 1 tablet (1 g total) by mouth daily.  . montelukast (SINGULAIR) 10 MG tablet Take 1 tablet (10 mg total) by mouth at bedtime.  . pantoprazole (PROTONIX) 40 MG tablet Take 1 tablet (40 mg total) by mouth daily.  . simvastatin (ZOCOR) 20 MG tablet Take 1  tablet (20 mg total) by mouth daily.  Marland Kitchen tolterodine (DETROL) 2 MG tablet Take 1 tablet (2 mg total) by mouth 2 (two) times daily.  . [DISCONTINUED] solifenacin (VESICARE) 5 MG tablet Take 1 tablet (5 mg total) by mouth daily.   No facility-administered encounter medications on file as of 10/31/2015.    Review of Systems  Constitutional: Positive for fatigue. Negative for appetite change and unexpected weight change.  HENT: Negative for congestion and sinus pressure.   Respiratory: Positive for cough. Negative for chest tightness and shortness of breath.   Cardiovascular: Negative for chest pain and palpitations.  Gastrointestinal: Negative for nausea, vomiting, abdominal pain and diarrhea.  Genitourinary: Negative for dysuria and difficulty urinating.  Musculoskeletal:  Negative for myalgias and joint swelling.  Skin: Negative for color change and rash.  Neurological: Negative for dizziness, light-headedness and headaches.  Psychiatric/Behavioral: Negative for dysphoric mood and agitation.       Objective:    Physical Exam  Constitutional: She appears well-developed and well-nourished. No distress.  HENT:  Nose: Nose normal.  Mouth/Throat: Oropharynx is clear and moist.  Neck: Neck supple. No thyromegaly present.  Cardiovascular: Normal rate and regular rhythm.   Pulmonary/Chest: Breath sounds normal. No respiratory distress. She has no wheezes.  Some increased cough with forced expiration.    Abdominal: Soft. Bowel sounds are normal. There is no tenderness.  Musculoskeletal: She exhibits no edema or tenderness.  Lymphadenopathy:    She has no cervical adenopathy.  Skin: No rash noted. No erythema.  Psychiatric: She has a normal mood and affect. Her behavior is normal.    BP 120/80 mmHg  Pulse 85  Temp(Src) 98.3 F (36.8 C) (Oral)  Resp 18  Ht 5' 6.25" (1.683 m)  Wt 182 lb 4 oz (82.668 kg)  BMI 29.19 kg/m2  SpO2 93% Wt Readings from Last 3 Encounters:  10/31/15 182 lb 4 oz (82.668 kg)  07/31/15 181 lb 6 oz (82.271 kg)  03/31/15 183 lb (83.008 kg)     Lab Results  Component Value Date   WBC 11.6 10/14/2015   HGB 11.6* 10/14/2015   HCT 37 10/14/2015   PLT 277 10/14/2015   GLUCOSE 103* 08/08/2015   CHOL 149 08/08/2015   TRIG 110.0 08/08/2015   HDL 47.80 08/08/2015   LDLCALC 79 08/08/2015   ALT 10 08/08/2015   AST 17 08/08/2015   NA 141 10/14/2015   K 4.5 10/14/2015   CL 103 08/08/2015   CREATININE 1.5* 10/14/2015   BUN 18 10/14/2015   CO2 30 08/08/2015   TSH 2.27 11/19/2014   HGBA1C 6.4 08/08/2015   MICROALBUR 3.6* 11/21/2014    US Breast Ltd Uni Right Inc Axilla  07/23/2015  CLINICAL DATA:  Short-term interval followup of a probable benign nodule in the right breast. EXAM: DIGITAL DIAGNOSTIC RIGHT MAMMOGRAM WITH 3D  TOMOSYNTHESIS WITH CAD ULTRASOUND RIGHT BREAST COMPARISON:  Previous exam(s). ACR Breast Density Category b: There are scattered areas of fibroglandular density. FINDINGS: 4 mm nodule in the 12 o'clock region of the right breast is stable when compared to prior exams dating back to 01/16/2014. No new suspicious mass or malignant type microcalcifications identified. Mammographic images were processed with CAD. On physical exam, I do not palpate a discrete mass in the 12 o'clock region the right breast. Targeted ultrasound is performed, showing a well-circumscribed hypoechoic lesion in the right breast at 12 o'clock 1 cm from the nipple measuring 4 x 3 x 4 mm. On the prior ultrasound dated  07/30/2014 it measured 4 x 3 x 3 mm. It is felt to likely be benign. IMPRESSION: Probable benign nodule in the right breast. RECOMMENDATION: Short-term interval follow-up bilateral mammogram and right breast ultrasound in August of 2017 is recommended. I have discussed the findings and recommendations with the patient. Results were also provided in writing at the conclusion of the visit. If applicable, a reminder letter will be sent to the patient regarding the next appointment. BI-RADS CATEGORY  3: Probably benign. Electronically Signed   By: Lillia Mountain M.D.   On: 07/23/2015 14:14   Mm Diag Breast Tomo Uni Right  07/23/2015  CLINICAL DATA:  Short-term interval followup of a probable benign nodule in the right breast. EXAM: DIGITAL DIAGNOSTIC RIGHT MAMMOGRAM WITH 3D TOMOSYNTHESIS WITH CAD ULTRASOUND RIGHT BREAST COMPARISON:  Previous exam(s). ACR Breast Density Category b: There are scattered areas of fibroglandular density. FINDINGS: 4 mm nodule in the 12 o'clock region of the right breast is stable when compared to prior exams dating back to 01/16/2014. No new suspicious mass or malignant type microcalcifications identified. Mammographic images were processed with CAD. On physical exam, I do not palpate a discrete mass in the  12 o'clock region the right breast. Targeted ultrasound is performed, showing a well-circumscribed hypoechoic lesion in the right breast at 12 o'clock 1 cm from the nipple measuring 4 x 3 x 4 mm. On the prior ultrasound dated 07/30/2014 it measured 4 x 3 x 3 mm. It is felt to likely be benign. IMPRESSION: Probable benign nodule in the right breast. RECOMMENDATION: Short-term interval follow-up bilateral mammogram and right breast ultrasound in August of 2017 is recommended. I have discussed the findings and recommendations with the patient. Results were also provided in writing at the conclusion of the visit. If applicable, a reminder letter will be sent to the patient regarding the next appointment. BI-RADS CATEGORY  3: Probably benign. Electronically Signed   By: Lillia Mountain M.D.   On: 07/23/2015 14:14       Assessment & Plan:   Problem List Items Addressed This Visit    Abnormal mammogram    She declines further mammogram.        Chronic obstructive airway disease with asthma ([place])    Cough and congestion as outlined.  advair bid.  Has rescue inhaler if needed.  Follow.        CKD (chronic kidney disease) stage 3, GFR 30-59 ml/min    Followed by nephrology.        Daytime somnolence    Increased fatigue.  Increased daytime somnolence.  Does not feel rested after sleeping.  Has hypertension.  Schedule for split night sleep study.  She has declined in the past.  Agreeable today.        Relevant Orders   Split night study   Diabetes mellitus with peripheral vascular disease ([place])    Low carb diet and exercise.  Follow met b and a1c.        History of frequent urinary tract infections    Has been evaluated by urology.  On hipprex.  Follow.        Hypercholesterolemia    On simvastatin.  Low cholesterol diet and exercise.  Follow lipid panel and liver function tests.        Hypertension - Primary    Blood pressure under good control.  Continue same medication regimen.  Follow  pressures.  Follow metabolic panel.        Lower extremity edema  Better with compression hose.  Follow.        Muscular deconditioning    Going to therapy.  Follow.       Obesity (BMI 30.0-34.9)    Discussed diet and exercise.  Follow.       Peripheral vascular disease ([facility])    Varying pressures in each arm.  Stable.  No pain.       Vitamin D deficiency    Follow vitamin D level.            Einar Pheasant, MD

## 2015-11-02 ENCOUNTER — Encounter: Payer: Self-pay | Admitting: Internal Medicine

## 2015-11-02 NOTE — Assessment & Plan Note (Signed)
Going to therapy.  Follow.

## 2015-11-02 NOTE — Assessment & Plan Note (Signed)
Discussed diet and exercise.  Follow.  

## 2015-11-02 NOTE — Assessment & Plan Note (Signed)
On simvastatin.  Low cholesterol diet and exercise.  Follow lipid panel and liver function tests.   

## 2015-11-02 NOTE — Assessment & Plan Note (Signed)
Followed by nephrology. 

## 2015-11-02 NOTE — Assessment & Plan Note (Signed)
Cough and congestion as outlined.  advair bid.  Has rescue inhaler if needed.  Follow.

## 2015-11-02 NOTE — Assessment & Plan Note (Signed)
Better with compression hose.  Follow.

## 2015-11-02 NOTE — Assessment & Plan Note (Signed)
Increased fatigue.  Increased daytime somnolence.  Does not feel rested after sleeping.  Has hypertension.  Schedule for split night sleep study.  She has declined in the past.  Agreeable today.

## 2015-11-02 NOTE — Assessment & Plan Note (Signed)
Has been evaluated by urology.  On hipprex.  Follow.

## 2015-11-02 NOTE — Assessment & Plan Note (Signed)
Varying pressures in each arm.  Stable.  No pain.

## 2015-11-02 NOTE — Assessment & Plan Note (Signed)
Low carb diet and exercise.  Follow met b and a1c.   

## 2015-11-02 NOTE — Assessment & Plan Note (Signed)
Blood pressure under good control.  Continue same medication regimen.  Follow pressures.  Follow metabolic panel.   

## 2015-11-02 NOTE — Assessment & Plan Note (Signed)
She declines further mammogram.

## 2015-11-02 NOTE — Assessment & Plan Note (Signed)
Follow vitamin D level.  

## 2015-11-10 ENCOUNTER — Other Ambulatory Visit: Payer: Self-pay | Admitting: Internal Medicine

## 2015-11-18 ENCOUNTER — Other Ambulatory Visit: Payer: Self-pay | Admitting: Internal Medicine

## 2015-12-03 ENCOUNTER — Other Ambulatory Visit: Payer: Self-pay | Admitting: Internal Medicine

## 2015-12-10 ENCOUNTER — Ambulatory Visit (INDEPENDENT_AMBULATORY_CARE_PROVIDER_SITE_OTHER): Payer: Medicare Other

## 2015-12-10 DIAGNOSIS — E538 Deficiency of other specified B group vitamins: Secondary | ICD-10-CM

## 2015-12-10 MED ORDER — CYANOCOBALAMIN 1000 MCG/ML IJ SOLN
1000.0000 ug | Freq: Once | INTRAMUSCULAR | Status: AC
  Administered 2015-12-10: 1000 ug via INTRAMUSCULAR

## 2015-12-10 NOTE — Progress Notes (Signed)
Patient presented for B12 injection.  She missed last 2 doses.  Verbal order to give today OK, per Dr. Dan HumphreysWalker. PCP currently on scheduled leave out of the office.   Administered R deltoid. Tolerated well.

## 2015-12-31 ENCOUNTER — Other Ambulatory Visit: Payer: Self-pay | Admitting: Internal Medicine

## 2015-12-31 NOTE — Telephone Encounter (Signed)
Last filled 12/03/15.

## 2016-01-13 ENCOUNTER — Ambulatory Visit (INDEPENDENT_AMBULATORY_CARE_PROVIDER_SITE_OTHER): Payer: Medicare Other

## 2016-01-13 ENCOUNTER — Other Ambulatory Visit: Payer: Self-pay | Admitting: Internal Medicine

## 2016-01-13 DIAGNOSIS — E538 Deficiency of other specified B group vitamins: Secondary | ICD-10-CM

## 2016-01-13 MED ORDER — CYANOCOBALAMIN 1000 MCG/ML IJ SOLN
1000.0000 ug | Freq: Once | INTRAMUSCULAR | Status: AC
  Administered 2016-01-13: 1000 ug via INTRAMUSCULAR

## 2016-01-13 NOTE — Progress Notes (Signed)
Patient came in for B-12 shot.  Please view prior appointments for further information.

## 2016-01-22 ENCOUNTER — Ambulatory Visit: Payer: Medicare Other

## 2016-01-22 ENCOUNTER — Other Ambulatory Visit: Payer: Medicare Other

## 2016-01-23 ENCOUNTER — Other Ambulatory Visit: Payer: Self-pay

## 2016-01-30 ENCOUNTER — Other Ambulatory Visit: Payer: Self-pay | Admitting: Internal Medicine

## 2016-02-03 DIAGNOSIS — R600 Localized edema: Secondary | ICD-10-CM | POA: Diagnosis not present

## 2016-02-03 DIAGNOSIS — N183 Chronic kidney disease, stage 3 (moderate): Secondary | ICD-10-CM | POA: Diagnosis not present

## 2016-02-03 DIAGNOSIS — E1129 Type 2 diabetes mellitus with other diabetic kidney complication: Secondary | ICD-10-CM | POA: Diagnosis not present

## 2016-02-05 ENCOUNTER — Encounter: Payer: Self-pay | Admitting: Internal Medicine

## 2016-02-05 ENCOUNTER — Ambulatory Visit (INDEPENDENT_AMBULATORY_CARE_PROVIDER_SITE_OTHER): Payer: Medicare Other | Admitting: Internal Medicine

## 2016-02-05 VITALS — BP 118/80 | HR 103 | Temp 98.3°F | Ht 66.0 in | Wt 187.4 lb

## 2016-02-05 DIAGNOSIS — K219 Gastro-esophageal reflux disease without esophagitis: Secondary | ICD-10-CM

## 2016-02-05 DIAGNOSIS — E669 Obesity, unspecified: Secondary | ICD-10-CM

## 2016-02-05 DIAGNOSIS — I10 Essential (primary) hypertension: Secondary | ICD-10-CM

## 2016-02-05 DIAGNOSIS — R928 Other abnormal and inconclusive findings on diagnostic imaging of breast: Secondary | ICD-10-CM

## 2016-02-05 DIAGNOSIS — E559 Vitamin D deficiency, unspecified: Secondary | ICD-10-CM | POA: Diagnosis not present

## 2016-02-05 DIAGNOSIS — E7800 Pure hypercholesterolemia, unspecified: Secondary | ICD-10-CM | POA: Diagnosis not present

## 2016-02-05 DIAGNOSIS — Z87440 Personal history of urinary (tract) infections: Secondary | ICD-10-CM

## 2016-02-05 DIAGNOSIS — R739 Hyperglycemia, unspecified: Secondary | ICD-10-CM

## 2016-02-05 DIAGNOSIS — M79672 Pain in left foot: Secondary | ICD-10-CM

## 2016-02-05 DIAGNOSIS — I739 Peripheral vascular disease, unspecified: Secondary | ICD-10-CM

## 2016-02-05 DIAGNOSIS — G4710 Hypersomnia, unspecified: Secondary | ICD-10-CM

## 2016-02-05 DIAGNOSIS — Z23 Encounter for immunization: Secondary | ICD-10-CM

## 2016-02-05 DIAGNOSIS — J45909 Unspecified asthma, uncomplicated: Secondary | ICD-10-CM

## 2016-02-05 DIAGNOSIS — E1151 Type 2 diabetes mellitus with diabetic peripheral angiopathy without gangrene: Secondary | ICD-10-CM

## 2016-02-05 DIAGNOSIS — N183 Chronic kidney disease, stage 3 unspecified: Secondary | ICD-10-CM

## 2016-02-05 DIAGNOSIS — J449 Chronic obstructive pulmonary disease, unspecified: Secondary | ICD-10-CM

## 2016-02-05 DIAGNOSIS — E538 Deficiency of other specified B group vitamins: Secondary | ICD-10-CM | POA: Diagnosis not present

## 2016-02-05 DIAGNOSIS — R400 Somnolence: Secondary | ICD-10-CM

## 2016-02-05 LAB — VITAMIN D 25 HYDROXY (VIT D DEFICIENCY, FRACTURES): VITD: 48.33 ng/mL (ref 30.00–100.00)

## 2016-02-05 LAB — CBC WITH DIFFERENTIAL/PLATELET
BASOS PCT: 0.5 % (ref 0.0–3.0)
Basophils Absolute: 0 10*3/uL (ref 0.0–0.1)
EOS PCT: 4.4 % (ref 0.0–5.0)
Eosinophils Absolute: 0.4 10*3/uL (ref 0.0–0.7)
HCT: 33.9 % — ABNORMAL LOW (ref 36.0–46.0)
HEMOGLOBIN: 11.1 g/dL — AB (ref 12.0–15.0)
LYMPHS PCT: 27.7 % (ref 12.0–46.0)
Lymphs Abs: 2.4 10*3/uL (ref 0.7–4.0)
MCHC: 32.7 g/dL (ref 30.0–36.0)
MCV: 90.4 fl (ref 78.0–100.0)
MONO ABS: 0.6 10*3/uL (ref 0.1–1.0)
MONOS PCT: 6.8 % (ref 3.0–12.0)
NEUTROS ABS: 5.2 10*3/uL (ref 1.4–7.7)
Neutrophils Relative %: 60.6 % (ref 43.0–77.0)
Platelets: 288 10*3/uL (ref 150.0–400.0)
RBC: 3.75 Mil/uL — AB (ref 3.87–5.11)
RDW: 16.1 % — ABNORMAL HIGH (ref 11.5–15.5)
WBC: 8.6 10*3/uL (ref 4.0–10.5)

## 2016-02-05 LAB — HEPATIC FUNCTION PANEL
ALT: 11 U/L (ref 0–35)
AST: 14 U/L (ref 0–37)
Albumin: 3.6 g/dL (ref 3.5–5.2)
Alkaline Phosphatase: 61 U/L (ref 39–117)
BILIRUBIN DIRECT: 0.1 mg/dL (ref 0.0–0.3)
BILIRUBIN TOTAL: 0.3 mg/dL (ref 0.2–1.2)
Total Protein: 6.6 g/dL (ref 6.0–8.3)

## 2016-02-05 LAB — TSH: TSH: 2.35 u[IU]/mL (ref 0.35–4.50)

## 2016-02-05 LAB — HEMOGLOBIN A1C: HEMOGLOBIN A1C: 6.4 % (ref 4.6–6.5)

## 2016-02-05 LAB — VITAMIN B12: VITAMIN B 12: 707 pg/mL (ref 211–911)

## 2016-02-05 NOTE — Progress Notes (Signed)
Pre visit review using our clinic review tool, if applicable. No additional management support is needed unless otherwise documented below in the visit note. 

## 2016-02-05 NOTE — Progress Notes (Signed)
[name], female   DOB: [birth year], 82 y.o.   MRN: [id number]   Subjective:    [name], female    DOB: Jun 11, 1926, 82 y.o.   MRN: [id number]  HPI  Patient here for a scheduled follow up.  She is accompanied by her daughter.  History obtained from both of them.  She feels her breathing is overall stable.  Some cough and congestion intermittently.  No chest pain.  Eating and drinking well.  No nausea or vomiting.  Bowels stable.  No abdominal pain or cramping.  Discussed concern regarding sleep apnea.  She had agreed last visit to a sleep study.  She changed her mind.  Does not want to pursue any further testing now.    Past Medical History:  Diagnosis Date  . Diabetes mellitus ([place])   . GERD (gastroesophageal reflux disease)    hiatal hernia  . Gout   . Hematuria    followed by urology  . History of colon polyps   . History of frequent urinary tract infections   . Humeral fracture 9/09   comminuted impacted proximal  . Hypercholesterolemia   . Hypertension   . Peripheral vascular disease [facility])    Past Surgical History:  Procedure Laterality Date  . BLADDER SUSPENSION  12/08/99  . BREAST BIOPSY Right 1960's  . CHOLECYSTECTOMY  1994   Family History  Problem Relation Age of Onset  . Hypertension Father   . Hypertension Mother   . Colitis Mother   . Breast cancer Neg Hx   . Colon cancer Neg Hx    Social History   Social History  . Marital status: Widowed    Spouse name: N/A  . Number of children: 2  . Years of education: N/A   Social History Main Topics  . Smoking status: Never Smoker  . Smokeless tobacco: Never Used  . Alcohol use No  . Drug use: No  . Sexual activity: No   Other Topics Concern  . None   Social History Narrative  . None    Outpatient Encounter Prescriptions as of 02/05/2016  Medication Sig  . albuterol (PROVENTIL HFA;VENTOLIN HFA) 108 (90 BASE) MCG/ACT inhaler Inhale 2 puffs into the lungs every 6 (six) hours  as needed for wheezing or shortness of breath.  . allopurinol (ZYLOPRIM) 100 MG tablet Take 1 tablet (100 mg total) by mouth daily.  . Calcium Carbonate-Vitamin D (CALCIUM 600+D) 600-400 MG-UNIT per tablet Take 1 tablet by mouth daily.   . cetirizine (ZYRTEC) 10 MG tablet Take 10 mg by mouth daily.  . Cholecalciferol (VITAMIN D-3) 1000 UNITS CAPS Take 1 capsule by mouth daily.  . Cranberry-Cholecalciferol 4200-500 MG-UNIT CAPS Take by mouth.  Marland Kitchen FLUoxetine (PROZAC) 20 MG capsule Take 1 capsule (20 mg total) by mouth daily.  . Fluticasone-Salmeterol (ADVAIR DISKUS) 250-50 MCG/DOSE AEPB Inhale 1 puff into the lungs 2 (two) times daily.  Marland Kitchen levothyroxine (SYNTHROID, LEVOTHROID) 50 MCG tablet Take 1 tablet (50 mcg total) by mouth daily.  . methenamine (HIPREX) 1 g tablet Take 1 tablet (1 g total) by mouth daily.  . montelukast (SINGULAIR) 10 MG tablet Take 1 tablet (10 mg total) by mouth at bedtime.  . pantoprazole (PROTONIX) 40 MG tablet Take 1 tablet (40 mg total) by mouth daily.  . simvastatin (ZOCOR) 20 MG tablet Take 1 tablet (20 mg total) by mouth daily.  Marland Kitchen tolterodine (DETROL) 2 MG tablet Take 1 tablet (2 mg total) by mouth 2 (  two) times daily.   No facility-administered encounter medications on file as of 02/05/2016.     Review of Systems  Constitutional: Negative for appetite change and unexpected weight change.  HENT: Negative for congestion and sinus pressure.   Respiratory: Positive for cough. Negative for chest tightness and shortness of breath.   Cardiovascular: Negative for chest pain, palpitations and leg swelling.  Gastrointestinal: Negative for abdominal pain, diarrhea, nausea and vomiting.  Genitourinary: Negative for difficulty urinating and dysuria.  Musculoskeletal: Negative for back pain and joint swelling.  Skin: Negative for color change and rash.  Neurological: Negative for dizziness, light-headedness and headaches.  Psychiatric/Behavioral: Negative for behavioral  problems and dysphoric mood.       Objective:    Physical Exam  Constitutional: She appears well-developed and well-nourished. No distress.  HENT:  Nose: Nose normal.  Mouth/Throat: Oropharynx is clear and moist.  Neck: Neck supple. No thyromegaly present.  Cardiovascular: Normal rate and regular rhythm.   Pulmonary/Chest: Breath sounds normal. No respiratory distress. She has no wheezes.  Abdominal: Soft. Bowel sounds are normal. There is no tenderness.  Musculoskeletal: She exhibits no edema or tenderness.  Lymphadenopathy:    She has no cervical adenopathy.  Skin: No rash noted. No erythema.  Psychiatric: She has a normal mood and affect. Her behavior is normal.    BP 118/80   Pulse (!) 103   Temp 98.3 F (36.8 C) (Oral)   Ht _0  (1.676 m)   Wt 187 lb 6.4 oz (85 kg)   SpO2 95%   BMI 30.25 kg/m  Wt Readings from Last 3 Encounters:  02/05/16 187 lb 6.4 oz (85 kg)  10/31/15 182 lb 4 oz (82.7 kg)  07/31/15 181 lb 6 oz (82.3 kg)     Lab Results  Component Value Date   WBC 8.6 02/05/2016   HGB 11.1 (L) 02/05/2016   HCT 33.9 (L) 02/05/2016   PLT 288.0 02/05/2016   GLUCOSE 103 (H) 08/08/2015   CHOL 149 08/08/2015   TRIG 110.0 08/08/2015   HDL 47.80 08/08/2015   LDLCALC 79 08/08/2015   ALT 11 02/05/2016   AST 14 02/05/2016   NA 141 10/14/2015   K 4.5 10/14/2015   CL 103 08/08/2015   CREATININE 1.5 (A) 10/14/2015   BUN 18 10/14/2015   CO2 30 08/08/2015   TSH 2.35 02/05/2016   HGBA1C 6.4 02/05/2016   MICROALBUR 3.6 (H) 11/21/2014    US Breast Ltd Uni Right Inc Axilla  Result Date: 07/23/2015 CLINICAL DATA:  Short-term interval followup of a probable benign nodule in the right breast. EXAM: DIGITAL DIAGNOSTIC RIGHT MAMMOGRAM WITH 3D TOMOSYNTHESIS WITH CAD ULTRASOUND RIGHT BREAST COMPARISON:  Previous exam(s). ACR Breast Density Category b: There are scattered areas of fibroglandular density. FINDINGS: 4 mm nodule in the 12 o'clock region of the right breast is  stable when compared to prior exams dating back to 01/16/2014. No new suspicious mass or malignant type microcalcifications identified. Mammographic images were processed with CAD. On physical exam, I do not palpate a discrete mass in the 12 o'clock region the right breast. Targeted ultrasound is performed, showing a well-circumscribed hypoechoic lesion in the right breast at 12 o'clock 1 cm from the nipple measuring 4 x 3 x 4 mm. On the prior ultrasound dated 07/30/2014 it measured 4 x 3 x 3 mm. It is felt to likely be benign. IMPRESSION: Probable benign nodule in the right breast. RECOMMENDATION: Short-term interval follow-up bilateral mammogram and right breast ultrasound in  August of 2017 is recommended. I have discussed the findings and recommendations with the patient. Results were also provided in writing at the conclusion of the visit. If applicable, a reminder letter will be sent to the patient regarding the next appointment. BI-RADS CATEGORY  3: Probably benign. Electronically Signed   By: Lillia Mountain M.D.   On: 07/23/2015 14:14   Mm Diag Breast Tomo Uni Right  Result Date: 07/23/2015 CLINICAL DATA:  Short-term interval followup of a probable benign nodule in the right breast. EXAM: DIGITAL DIAGNOSTIC RIGHT MAMMOGRAM WITH 3D TOMOSYNTHESIS WITH CAD ULTRASOUND RIGHT BREAST COMPARISON:  Previous exam(s). ACR Breast Density Category b: There are scattered areas of fibroglandular density. FINDINGS: 4 mm nodule in the 12 o'clock region of the right breast is stable when compared to prior exams dating back to 01/16/2014. No new suspicious mass or malignant type microcalcifications identified. Mammographic images were processed with CAD. On physical exam, I do not palpate a discrete mass in the 12 o'clock region the right breast. Targeted ultrasound is performed, showing a well-circumscribed hypoechoic lesion in the right breast at 12 o'clock 1 cm from the nipple measuring 4 x 3 x 4 mm. On the prior ultrasound  dated 07/30/2014 it measured 4 x 3 x 3 mm. It is felt to likely be benign. IMPRESSION: Probable benign nodule in the right breast. RECOMMENDATION: Short-term interval follow-up bilateral mammogram and right breast ultrasound in August of 2017 is recommended. I have discussed the findings and recommendations with the patient. Results were also provided in writing at the conclusion of the visit. If applicable, a reminder letter will be sent to the patient regarding the next appointment. BI-RADS CATEGORY  3: Probably benign. Electronically Signed   By: Lillia Mountain M.D.   On: 07/23/2015 14:14       Assessment & Plan:   Problem List Items Addressed This Visit    Abnormal mammogram    Discussed f/u mammogram.  She declines.  Desires no further scanning.        Chronic obstructive airway disease with asthma ([facility])    Some intermittent flares.  Continue inhalers.        CKD (chronic kidney disease) stage 3, GFR 30-59 ml/min - Primary    Followed by nephrology.  Has been stable.        Relevant Orders   CBC with Differential/Platelet (Completed)   Daytime somnolence    Discussed sleep study.  She declines.  Follow.        Diabetes mellitus with peripheral vascular disease ([facility])    Low carb diet and exercise.  Follow met b and a1c.   Lab Results  Component Value Date   HGBA1C 6.4 02/05/2016        GERD (gastroesophageal reflux disease)    Controlled on protonix.        History of frequent urinary tract infections    Has been evaluated by urology.  On hipprex.  Urology recommended.  Follow.       Hypercholesterolemia    On simvastatin.  Low cholesterol diet and exercise.  Follow lipid panel and liver function tests.        Relevant Orders   Hepatic function panel (Completed)   Hypertension    Blood pressure on recheck by me:  126/66.  Same medication regimen.  Follow pressures.  Follow metabolic panel.        Relevant Orders   TSH (Completed)   Obesity (BMI 30.0-34.9)     Diet and  exercise as tolerated.        Peripheral vascular disease ([facility])    Varying pressures in arms.  Stable.  Follow.       Vitamin B12 deficiency    Continue B12 injections.        Relevant Orders   Vitamin B12 (Completed)   Vitamin D deficiency    Follow vitamin D level.        Relevant Orders   VITAMIN D 25 Hydroxy (Vit-D Deficiency, Fractures) (Completed)    Other Visit Diagnoses    Left foot pain       Hyperglycemia       Relevant Orders   Hemoglobin A1c (Completed)   Encounter for immunization       Relevant Orders   Flu vaccine HIGH DOSE PF (Completed)       Einar Pheasant, MD

## 2016-02-08 ENCOUNTER — Encounter: Payer: Self-pay | Admitting: Internal Medicine

## 2016-02-08 NOTE — Assessment & Plan Note (Signed)
Discussed sleep study.  She declines.  Follow.

## 2016-02-08 NOTE — Assessment & Plan Note (Signed)
Follow vitamin D level.  

## 2016-02-08 NOTE — Assessment & Plan Note (Signed)
Followed by nephrology.  Has been stable.  

## 2016-02-08 NOTE — Assessment & Plan Note (Signed)
Has been evaluated by urology.  On hipprex.  Urology recommended.  Follow.

## 2016-02-08 NOTE — Assessment & Plan Note (Signed)
Varying pressures in arms.  Stable.  Follow.

## 2016-02-08 NOTE — Assessment & Plan Note (Signed)
On simvastatin.  Low cholesterol diet and exercise.  Follow lipid panel and liver function tests.   

## 2016-02-08 NOTE — Assessment & Plan Note (Signed)
Blood pressure on recheck by me:  126/66.  Same medication regimen.  Follow pressures.  Follow metabolic panel.

## 2016-02-08 NOTE — Assessment & Plan Note (Signed)
Continue B12 injections.   

## 2016-02-08 NOTE — Assessment & Plan Note (Signed)
Diet and exercise as tolerated. °

## 2016-02-08 NOTE — Assessment & Plan Note (Signed)
Some intermittent flares.  Continue inhalers.

## 2016-02-08 NOTE — Assessment & Plan Note (Signed)
Discussed f/u mammogram.  She declines.  Desires no further scanning.

## 2016-02-08 NOTE — Assessment & Plan Note (Signed)
Low carb diet and exercise.  Follow met b and a1c.   Lab Results  Component Value Date   HGBA1C 6.4 02/05/2016

## 2016-02-08 NOTE — Assessment & Plan Note (Signed)
Controlled on protonix.   

## 2016-02-09 ENCOUNTER — Other Ambulatory Visit: Payer: Self-pay | Admitting: Internal Medicine

## 2016-02-09 DIAGNOSIS — D649 Anemia, unspecified: Secondary | ICD-10-CM

## 2016-02-17 ENCOUNTER — Ambulatory Visit (INDEPENDENT_AMBULATORY_CARE_PROVIDER_SITE_OTHER): Payer: Medicare Other

## 2016-02-17 ENCOUNTER — Telehealth: Payer: Self-pay | Admitting: Surgical

## 2016-02-17 DIAGNOSIS — E538 Deficiency of other specified B group vitamins: Secondary | ICD-10-CM | POA: Diagnosis not present

## 2016-02-17 MED ORDER — CYANOCOBALAMIN 1000 MCG/ML IJ SOLN
1000.0000 ug | Freq: Once | INTRAMUSCULAR | Status: AC
  Administered 2016-02-17: 1000 ug via INTRAMUSCULAR

## 2016-02-17 NOTE — Progress Notes (Addendum)
Patient came in for b12 injection, received in right deltoid.  Tolerated well.   Reviewed.  Dr Scott 

## 2016-02-17 NOTE — Telephone Encounter (Signed)
Patient came in for B 12 injection. Notified her of her lab results. She scheduled a lab appointment.

## 2016-02-26 ENCOUNTER — Other Ambulatory Visit (INDEPENDENT_AMBULATORY_CARE_PROVIDER_SITE_OTHER): Payer: Medicare Other

## 2016-02-26 ENCOUNTER — Telehealth: Payer: Self-pay | Admitting: *Deleted

## 2016-02-26 DIAGNOSIS — D649 Anemia, unspecified: Secondary | ICD-10-CM

## 2016-02-26 LAB — CBC WITH DIFFERENTIAL/PLATELET
BASOS PCT: 0.4 % (ref 0.0–3.0)
Basophils Absolute: 0 10*3/uL (ref 0.0–0.1)
EOS PCT: 6.9 % — AB (ref 0.0–5.0)
Eosinophils Absolute: 0.7 10*3/uL (ref 0.0–0.7)
HCT: 37.2 % (ref 36.0–46.0)
HEMOGLOBIN: 11.9 g/dL — AB (ref 12.0–15.0)
LYMPHS ABS: 2.5 10*3/uL (ref 0.7–4.0)
Lymphocytes Relative: 25.3 % (ref 12.0–46.0)
MCHC: 31.9 g/dL (ref 30.0–36.0)
MCV: 90.6 fl (ref 78.0–100.0)
Monocytes Absolute: 0.7 10*3/uL (ref 0.1–1.0)
Monocytes Relative: 6.8 % (ref 3.0–12.0)
Neutro Abs: 5.9 10*3/uL (ref 1.4–7.7)
Neutrophils Relative %: 60.6 % (ref 43.0–77.0)
Platelets: 305 10*3/uL (ref 150.0–400.0)
RBC: 4.1 Mil/uL (ref 3.87–5.11)
RDW: 16.1 % — AB (ref 11.5–15.5)
WBC: 9.8 10*3/uL (ref 4.0–10.5)

## 2016-02-26 LAB — IBC PANEL
Iron: 52 ug/dL (ref 42–145)
SATURATION RATIOS: 14.8 % — AB (ref 20.0–50.0)
Transferrin: 251 mg/dL (ref 212.0–360.0)

## 2016-02-26 LAB — FERRITIN: Ferritin: 11.6 ng/mL (ref 10.0–291.0)

## 2016-02-26 NOTE — Telephone Encounter (Signed)
Spoke with the patient, Has family in town and they have helped her decide to have a sleep study done now.  LAst two weeks she has not been sleeping well at all. Please advise and order. thanks

## 2016-02-26 NOTE — Telephone Encounter (Signed)
Per last conversation with the patient and her daughter (at her appt), the patient did not want the sleep study.  Please clarify with pt that she actually had a sleep study.  If so, need to know where she had the study.  We do not have results.

## 2016-02-26 NOTE — Telephone Encounter (Signed)
Pt was in for labs this morning & wanted me to ask about her sleep apnea. Pt states that she had a sleep study & not sure of the results. She wanted to see if she qualified for a CPAP.

## 2016-02-27 ENCOUNTER — Other Ambulatory Visit: Payer: Self-pay

## 2016-02-27 ENCOUNTER — Encounter: Payer: Self-pay | Admitting: Internal Medicine

## 2016-02-27 MED ORDER — TOLTERODINE TARTRATE 2 MG PO TABS: 2.0000 mg | ORAL_TABLET | Freq: Two times a day (BID) | ORAL | 2 refills | Status: DC

## 2016-02-27 NOTE — Telephone Encounter (Signed)
The split night sleep study is already ordered - order from June.  Do I need to place another order or can this order be used?

## 2016-03-02 ENCOUNTER — Other Ambulatory Visit: Payer: Medicare Other

## 2016-03-16 ENCOUNTER — Encounter: Payer: Self-pay | Admitting: Internal Medicine

## 2016-03-16 ENCOUNTER — Ambulatory Visit: Payer: Medicare Other | Attending: Otolaryngology

## 2016-03-16 DIAGNOSIS — G4733 Obstructive sleep apnea (adult) (pediatric): Secondary | ICD-10-CM | POA: Diagnosis not present

## 2016-03-16 DIAGNOSIS — R0683 Snoring: Secondary | ICD-10-CM | POA: Insufficient documentation

## 2016-03-16 DIAGNOSIS — I491 Atrial premature depolarization: Secondary | ICD-10-CM | POA: Insufficient documentation

## 2016-03-23 ENCOUNTER — Telehealth: Payer: Self-pay | Admitting: Internal Medicine

## 2016-03-23 ENCOUNTER — Ambulatory Visit: Payer: Medicare Other

## 2016-03-23 NOTE — Telephone Encounter (Signed)
I called pt and left a vm to sch a AWV. Thank you!

## 2016-03-24 ENCOUNTER — Ambulatory Visit (INDEPENDENT_AMBULATORY_CARE_PROVIDER_SITE_OTHER): Payer: Medicare Other

## 2016-03-24 DIAGNOSIS — E538 Deficiency of other specified B group vitamins: Secondary | ICD-10-CM | POA: Diagnosis not present

## 2016-03-24 MED ORDER — CYANOCOBALAMIN 1000 MCG/ML IJ SOLN
1000.0000 ug | Freq: Once | INTRAMUSCULAR | Status: AC
  Administered 2016-03-24: 1000 ug via INTRAMUSCULAR

## 2016-03-24 NOTE — Progress Notes (Signed)
Care was provided under my supervision. I agree with the management as indicated in the note.  Rameen Quinney DO  

## 2016-03-24 NOTE — Progress Notes (Signed)
Patient presents for B 12 injection.  Injected left deltoid patient tolerated well.    

## 2016-04-01 ENCOUNTER — Other Ambulatory Visit: Payer: Self-pay | Admitting: Internal Medicine

## 2016-04-01 NOTE — Telephone Encounter (Signed)
Last filled 12/31/2015 30 1 rf

## 2016-04-05 ENCOUNTER — Encounter: Payer: Self-pay | Admitting: Internal Medicine

## 2016-04-09 ENCOUNTER — Other Ambulatory Visit: Payer: Self-pay | Admitting: Internal Medicine

## 2016-04-26 ENCOUNTER — Telehealth: Payer: Self-pay | Admitting: Internal Medicine

## 2016-04-26 NOTE — Telephone Encounter (Signed)
Pt daughter wants to speak with Dr Lorin PicketScott about the AWV appts. Thank you!

## 2016-04-27 ENCOUNTER — Encounter: Payer: Self-pay | Admitting: Internal Medicine

## 2016-04-27 ENCOUNTER — Ambulatory Visit: Payer: Medicare Other | Attending: Otolaryngology

## 2016-04-27 ENCOUNTER — Ambulatory Visit: Payer: Medicare Other

## 2016-04-27 DIAGNOSIS — G4733 Obstructive sleep apnea (adult) (pediatric): Secondary | ICD-10-CM | POA: Insufficient documentation

## 2016-04-29 ENCOUNTER — Ambulatory Visit (INDEPENDENT_AMBULATORY_CARE_PROVIDER_SITE_OTHER): Payer: Medicare Other

## 2016-04-29 DIAGNOSIS — E538 Deficiency of other specified B group vitamins: Secondary | ICD-10-CM

## 2016-04-29 MED ORDER — CYANOCOBALAMIN 1000 MCG/ML IJ SOLN
1000.0000 ug | Freq: Once | INTRAMUSCULAR | Status: AC
  Administered 2016-04-29: 1000 ug via INTRAMUSCULAR

## 2016-04-29 NOTE — Progress Notes (Addendum)
Patient came in for b12 injection.  Received in Right deltoid.  Patient tolerated well.   Addendum.  Reviewed.  Dr Lorin PicketScott

## 2016-05-11 ENCOUNTER — Encounter: Payer: Self-pay | Admitting: Internal Medicine

## 2016-05-11 ENCOUNTER — Ambulatory Visit (INDEPENDENT_AMBULATORY_CARE_PROVIDER_SITE_OTHER): Payer: Medicare Other | Admitting: Internal Medicine

## 2016-05-11 VITALS — BP 124/62 | HR 90 | Temp 97.6°F | Ht 66.0 in | Wt 187.8 lb

## 2016-05-11 DIAGNOSIS — I739 Peripheral vascular disease, unspecified: Secondary | ICD-10-CM

## 2016-05-11 DIAGNOSIS — K219 Gastro-esophageal reflux disease without esophagitis: Secondary | ICD-10-CM

## 2016-05-11 DIAGNOSIS — D649 Anemia, unspecified: Secondary | ICD-10-CM

## 2016-05-11 DIAGNOSIS — N183 Chronic kidney disease, stage 3 unspecified: Secondary | ICD-10-CM

## 2016-05-11 DIAGNOSIS — R059 Cough, unspecified: Secondary | ICD-10-CM

## 2016-05-11 DIAGNOSIS — R05 Cough: Secondary | ICD-10-CM

## 2016-05-11 DIAGNOSIS — E7800 Pure hypercholesterolemia, unspecified: Secondary | ICD-10-CM

## 2016-05-11 DIAGNOSIS — E559 Vitamin D deficiency, unspecified: Secondary | ICD-10-CM

## 2016-05-11 DIAGNOSIS — E538 Deficiency of other specified B group vitamins: Secondary | ICD-10-CM

## 2016-05-11 DIAGNOSIS — I10 Essential (primary) hypertension: Secondary | ICD-10-CM

## 2016-05-11 DIAGNOSIS — E1151 Type 2 diabetes mellitus with diabetic peripheral angiopathy without gangrene: Secondary | ICD-10-CM

## 2016-05-11 DIAGNOSIS — E669 Obesity, unspecified: Secondary | ICD-10-CM

## 2016-05-11 DIAGNOSIS — J449 Chronic obstructive pulmonary disease, unspecified: Secondary | ICD-10-CM

## 2016-05-11 MED ORDER — PREDNISONE 10 MG PO TABS: ORAL_TABLET | ORAL | 0 refills | Status: DC

## 2016-05-11 NOTE — Progress Notes (Signed)
[name], female   DOB: May 30, 1926, 82 y.o.   MRN: [id number]   Subjective:    [name], female    DOB: 03/29/1927, 82 y.o.   MRN: [id number]  HPI  Patient here for a scheduled follow up.  She is accompanied by her daughter.  History obtained from both of them.  She had a lot of questions about the recent sleep study.  She was frustrated by the testing.  Did not like that she had to go twice for testing.  Does have sleep apnea.  Had to return for cpap titration.  Discussed the process and testing with her.  No chest pain.  She has noticed recently some increased cough and congestion.  Flared recently.  Previously saw Dr Lake Bells.  Discussed referral back to pulmonary given persistent underlying issues with her breathing.  No acid reflux  No abdominal pain or cramping.  Bowels stable.  Frustrated that she cannot do the things she used to could do.     Past Medical History:  Diagnosis Date  . Diabetes mellitus ([place])   . GERD (gastroesophageal reflux disease)    hiatal hernia  . Gout   . Hematuria    followed by urology  . History of colon polyps   . History of frequent urinary tract infections   . Humeral fracture 9/09   comminuted impacted proximal  . Hypercholesterolemia   . Hypertension   . Peripheral vascular disease [facility])    Past Surgical History:  Procedure Laterality Date  . BLADDER SUSPENSION  12/08/99  . BREAST BIOPSY Right 1960's  . CHOLECYSTECTOMY  1994   Family History  Problem Relation Age of Onset  . Hypertension Father   . Hypertension Mother   . Colitis Mother   . Breast cancer Neg Hx   . Colon cancer Neg Hx    Social History   Social History  . Marital status: Widowed    Spouse name: N/A  . Number of children: 2  . Years of education: N/A   Social History Main Topics  . Smoking status: Never Smoker  . Smokeless tobacco: Never Used  . Alcohol use No  . Drug use: No  . Sexual activity: No   Other Topics Concern  . None     Social History Narrative  . None    Outpatient Encounter Prescriptions as of 05/11/2016  Medication Sig  . albuterol (PROVENTIL HFA;VENTOLIN HFA) 108 (90 BASE) MCG/ACT inhaler Inhale 2 puffs into the lungs every 6 (six) hours as needed for wheezing or shortness of breath.  . allopurinol (ZYLOPRIM) 100 MG tablet Take 1 tablet (100 mg total) by mouth daily.  . Calcium Carbonate-Vitamin D (CALCIUM 600+D) 600-400 MG-UNIT per tablet Take 1 tablet by mouth daily.   . cetirizine (ZYRTEC) 10 MG tablet Take 10 mg by mouth daily.  . Cholecalciferol (VITAMIN D-3) 1000 UNITS CAPS Take 1 capsule by mouth daily.  . Cranberry-Cholecalciferol 4200-500 MG-UNIT CAPS Take by mouth.  Marland Kitchen FLUoxetine (PROZAC) 20 MG capsule Take 1 capsule (20 mg total) by mouth daily.  . Fluticasone-Salmeterol (ADVAIR DISKUS) 250-50 MCG/DOSE AEPB Inhale 1 puff into the lungs 2 (two) times daily.  Marland Kitchen levothyroxine (SYNTHROID, LEVOTHROID) 50 MCG tablet Take 1 tablet (50 mcg total) by mouth daily.  . methenamine (HIPREX) 1 g tablet Take 1 tablet (1 g total) by mouth daily.  . montelukast (SINGULAIR) 10 MG tablet Take 1 tablet (10 mg total) by mouth at bedtime.  Marland Kitchen  pantoprazole (PROTONIX) 40 MG tablet Take 1 tablet (40 mg total) by mouth daily.  . simvastatin (ZOCOR) 20 MG tablet Take 1 tablet (20 mg total) by mouth daily.  Marland Kitchen tolterodine (DETROL) 2 MG tablet Take 1 tablet (2 mg total) by mouth 2 (two) times daily.  . predniSONE (DELTASONE) 10 MG tablet Take 6 tablets x 1 day and then decrease by 1/2 tablet per day until down to zero mg.   No facility-administered encounter medications on file as of 05/11/2016.     Review of Systems  Constitutional: Positive for fatigue. Negative for appetite change and unexpected weight change.  HENT: Negative for sinus pressure and sore throat.   Respiratory: Positive for cough, shortness of breath and wheezing. Negative for chest tightness.   Cardiovascular: Negative for chest pain, palpitations  and leg swelling.  Gastrointestinal: Negative for abdominal pain, diarrhea, nausea and vomiting.  Genitourinary: Negative for difficulty urinating and dysuria.  Musculoskeletal: Negative for back pain and joint swelling.  Skin: Negative for color change and rash.  Neurological: Negative for dizziness, light-headedness and headaches.  Psychiatric/Behavioral: Negative for dysphoric mood and hallucinations.       Objective:    Physical Exam  Constitutional: She appears well-developed and well-nourished. No distress.  HENT:  Nose: Nose normal.  Mouth/Throat: Oropharynx is clear and moist.  Neck: Neck supple. No thyromegaly present.  Cardiovascular: Normal rate and regular rhythm.   Pulmonary/Chest: Breath sounds normal. No respiratory distress.  Increased cough with expiration.  Some audible wheezing.    Abdominal: Soft. Bowel sounds are normal. There is no tenderness.  Musculoskeletal: She exhibits no edema or tenderness.  Lymphadenopathy:    She has no cervical adenopathy.  Skin: No rash noted. No erythema.  Psychiatric: She has a normal mood and affect. Her behavior is normal.    BP 124/62   Pulse 90   Temp 97.6 F (36.4 C) (Oral)   Ht '5\' 6"'  (1.676 m)   Wt 187 lb 12.8 oz (85.2 kg)   SpO2 93%   BMI 30.31 kg/m  Wt Readings from Last 3 Encounters:  05/11/16 187 lb 12.8 oz (85.2 kg)  02/05/16 187 lb 6.4 oz (85 kg)  10/31/15 182 lb 4 oz (82.7 kg)     Lab Results  Component Value Date   WBC 9.8 05/11/2016   HGB 11.7 (L) 05/11/2016   HCT 36.2 05/11/2016   PLT 278.0 05/11/2016   GLUCOSE 115 (H) 05/11/2016   CHOL 149 08/08/2015   TRIG 110.0 08/08/2015   HDL 47.80 08/08/2015   LDLCALC 79 08/08/2015   ALT 11 02/05/2016   AST 14 02/05/2016   NA 141 05/11/2016   K 4.2 05/11/2016   CL 104 05/11/2016   CREATININE 1.42 (H) 05/11/2016   BUN 24 (H) 05/11/2016   CO2 28 05/11/2016   TSH 2.35 02/05/2016   HGBA1C 6.4 02/05/2016   MICROALBUR 3.6 (H) 11/21/2014    US  Breast Ltd Uni Right Inc Axilla  Result Date: 07/23/2015 CLINICAL DATA:  Short-term interval followup of a probable benign nodule in the right breast. EXAM: DIGITAL DIAGNOSTIC RIGHT MAMMOGRAM WITH 3D TOMOSYNTHESIS WITH CAD ULTRASOUND RIGHT BREAST COMPARISON:  Previous exam(s). ACR Breast Density Category b: There are scattered areas of fibroglandular density. FINDINGS: 4 mm nodule in the 12 o'clock region of the right breast is stable when compared to prior exams dating back to 01/16/2014. No new suspicious mass or malignant type microcalcifications identified. Mammographic images were processed with CAD. On physical exam, I  do not palpate a discrete mass in the 12 o'clock region the right breast. Targeted ultrasound is performed, showing a well-circumscribed hypoechoic lesion in the right breast at 12 o'clock 1 cm from the nipple measuring 4 x 3 x 4 mm. On the prior ultrasound dated 07/30/2014 it measured 4 x 3 x 3 mm. It is felt to likely be benign. IMPRESSION: Probable benign nodule in the right breast. RECOMMENDATION: Short-term interval follow-up bilateral mammogram and right breast ultrasound in August of 2017 is recommended. I have discussed the findings and recommendations with the patient. Results were also provided in writing at the conclusion of the visit. If applicable, a reminder letter will be sent to the patient regarding the next appointment. BI-RADS CATEGORY  3: Probably benign. Electronically Signed   By: Lillia Mountain M.D.   On: 07/23/2015 14:14   Mm Diag Breast Tomo Uni Right  Result Date: 07/23/2015 CLINICAL DATA:  Short-term interval followup of a probable benign nodule in the right breast. EXAM: DIGITAL DIAGNOSTIC RIGHT MAMMOGRAM WITH 3D TOMOSYNTHESIS WITH CAD ULTRASOUND RIGHT BREAST COMPARISON:  Previous exam(s). ACR Breast Density Category b: There are scattered areas of fibroglandular density. FINDINGS: 4 mm nodule in the 12 o'clock region of the right breast is stable when compared to  prior exams dating back to 01/16/2014. No new suspicious mass or malignant type microcalcifications identified. Mammographic images were processed with CAD. On physical exam, I do not palpate a discrete mass in the 12 o'clock region the right breast. Targeted ultrasound is performed, showing a well-circumscribed hypoechoic lesion in the right breast at 12 o'clock 1 cm from the nipple measuring 4 x 3 x 4 mm. On the prior ultrasound dated 07/30/2014 it measured 4 x 3 x 3 mm. It is felt to likely be benign. IMPRESSION: Probable benign nodule in the right breast. RECOMMENDATION: Short-term interval follow-up bilateral mammogram and right breast ultrasound in August of 2017 is recommended. I have discussed the findings and recommendations with the patient. Results were also provided in writing at the conclusion of the visit. If applicable, a reminder letter will be sent to the patient regarding the next appointment. BI-RADS CATEGORY  3: Probably benign. Electronically Signed   By: Lillia Mountain M.D.   On: 07/23/2015 14:14       Assessment & Plan:   Problem List Items Addressed This Visit    Chronic obstructive airway disease with asthma ([place])    Treat current flare as outlined.  Continue current inhaler regimen.  Refer back to pulmonary.        Relevant Medications   predniSONE (DELTASONE) 10 MG tablet   Other Relevant Orders   Ambulatory referral to Pulmonology   CKD (chronic kidney disease) stage 3, GFR 30-59 ml/min    Has been followed by nephrology.  Recheck metabolic panel.        Cough - Primary    With increased cough and congestion.  Persistent intermittent flares and some sob/wheezing.  Treat with prednisone taper as directed.  Continue inhalers.  Use on a regular basis.  Discussed referral back to pulmonary.  She is in agreement.  Previously saw Dr Lake Bells.        Relevant Orders   Ambulatory referral to Pulmonology   Diabetes mellitus with peripheral vascular disease ([place])    Low carb  diet and exercise.  Follow met b and a1c.  Keep up to date with eye checks.  Last a1c 6.4.        Relevant Orders  Hemoglobin G6M   Basic metabolic panel   GERD (gastroesophageal reflux disease)    Controlled on protonix.        Hypercholesterolemia    On simvastatin.  Low cholesterol diet and exercise.  Follow lipid panel and liver function tests.        Relevant Orders   Hepatic function panel   Lipid panel   Hypertension    Blood pressure under good control.  Continue same medication regimen.  Follow pressures.  Follow metabolic panel.        Obesity (BMI 30.0-34.9)    Diet and exercise.  Follow.       Peripheral vascular disease (H[facility])    Varying pressures in her arms.  Stable.  Follow.  No pain.       Vitamin B12 deficiency    Continue b12 injections.        Vitamin D deficiency    Follow vitamin D level.         Other Visit Diagnoses    Anemia, unspecified type       Relevant Orders   CBC with Differential/Platelet (Completed)   CBC with Differential/Platelet   Ferritin   CKD (chronic kidney disease), stage III       Relevant Orders   Basic metabolic panel (Completed)       Einar Pheasant, MD

## 2016-05-11 NOTE — Progress Notes (Signed)
Pre visit review using our clinic review tool, if applicable. No additional management support is needed unless otherwise documented below in the visit note. 

## 2016-05-12 LAB — BASIC METABOLIC PANEL
BUN: 24 mg/dL — AB (ref 6–23)
CHLORIDE: 104 meq/L (ref 96–112)
CO2: 28 meq/L (ref 19–32)
Calcium: 9.2 mg/dL (ref 8.4–10.5)
Creatinine, Ser: 1.42 mg/dL — ABNORMAL HIGH (ref 0.40–1.20)
GFR: 37.15 mL/min — AB (ref >60.00)
GLUCOSE: 115 mg/dL — AB (ref 70–99)
POTASSIUM: 4.2 meq/L (ref 3.5–5.1)
SODIUM: 141 meq/L (ref 135–145)

## 2016-05-12 LAB — CBC WITH DIFFERENTIAL/PLATELET
BASOS ABS: 0.1 10*3/uL (ref 0.0–0.1)
BASOS PCT: 0.8 % (ref 0.0–3.0)
EOS ABS: 0.4 10*3/uL (ref 0.0–0.7)
Eosinophils Relative: 3.9 % (ref 0.0–5.0)
HEMATOCRIT: 36.2 % (ref 36.0–46.0)
HEMOGLOBIN: 11.7 g/dL — AB (ref 12.0–15.0)
LYMPHS PCT: 27.2 % (ref 12.0–46.0)
Lymphs Abs: 2.7 10*3/uL (ref 0.7–4.0)
MCHC: 32.3 g/dL (ref 30.0–36.0)
MCV: 90.8 fl (ref 78.0–100.0)
MONO ABS: 0.7 10*3/uL (ref 0.1–1.0)
Monocytes Relative: 7.6 % (ref 3.0–12.0)
Neutro Abs: 6 10*3/uL (ref 1.4–7.7)
Neutrophils Relative %: 60.5 % (ref 43.0–77.0)
Platelets: 278 10*3/uL (ref 150.0–400.0)
RBC: 3.98 Mil/uL (ref 3.87–5.11)
RDW: 16.3 % — AB (ref 11.5–15.5)
WBC: 9.8 10*3/uL (ref 4.0–10.5)

## 2016-05-13 ENCOUNTER — Telehealth: Payer: Self-pay | Admitting: Internal Medicine

## 2016-05-13 ENCOUNTER — Encounter: Payer: Self-pay | Admitting: Internal Medicine

## 2016-05-13 NOTE — Telephone Encounter (Signed)
It looks like this is scanned in the system. Do we need to make an appointment sooner with you to discuss?

## 2016-05-13 NOTE — Telephone Encounter (Signed)
LM to return call.

## 2016-05-13 NOTE — Telephone Encounter (Signed)
Pt had sleep study and then cpap titration.  Pt and her daughter have not heard anything about the titration study.  Please call [facility] ([facility] and see if they have the titration study and do they plan to set pt up for CPAP.

## 2016-05-13 NOTE — Telephone Encounter (Signed)
Her sleep study is scanned in and the order for the cpap titration is scanned in.   Please call sleep [facility] and see if I need to do anything to get her cpap set up.  I do not see the actual titration result or the order to sign for her to have cpap.  (I may be missing it).

## 2016-05-20 ENCOUNTER — Telehealth: Payer: Self-pay | Admitting: Pulmonary Disease

## 2016-05-20 ENCOUNTER — Encounter: Payer: Self-pay | Admitting: Internal Medicine

## 2016-05-20 NOTE — Assessment & Plan Note (Signed)
Low carb diet and exercise.  Follow met b and a1c.  Keep up to date with eye checks.  Last a1c 6.4.

## 2016-05-20 NOTE — Telephone Encounter (Signed)
[facility] but saw [facility] in 2016.  Is this ok with both of you for [facility] to [facility]? Per Protocol both providers have to agree prior to scheduling?

## 2016-05-20 NOTE — Assessment & Plan Note (Signed)
Blood pressure under good control.  Continue same medication regimen.  Follow pressures.  Follow metabolic panel.   

## 2016-05-20 NOTE — Assessment & Plan Note (Signed)
Has been followed by nephrology.  Recheck metabolic panel.

## 2016-05-20 NOTE — Assessment & Plan Note (Signed)
With increased cough and congestion.  Persistent intermittent flares and some sob/wheezing.  Treat with prednisone taper as directed.  Continue inhalers.  Use on a regular basis.  Discussed referral back to pulmonary.  She is in agreement.  Previously saw Dr Kendrick FriesMcQuaid.

## 2016-05-20 NOTE — Assessment & Plan Note (Signed)
On simvastatin.  Low cholesterol diet and exercise.  Follow lipid panel and liver function tests.   

## 2016-05-20 NOTE — Telephone Encounter (Signed)
OK by me 

## 2016-05-20 NOTE — Assessment & Plan Note (Signed)
Varying pressures in her arms.  Stable.  Follow.  No pain.

## 2016-05-20 NOTE — Assessment & Plan Note (Signed)
Continue b12 injections.  

## 2016-05-20 NOTE — Assessment & Plan Note (Signed)
Diet and exercise.  Follow.  

## 2016-05-20 NOTE — Assessment & Plan Note (Signed)
Follow vitamin D level.  

## 2016-05-20 NOTE — Assessment & Plan Note (Signed)
Treat current flare as outlined.  Continue current inhaler regimen.  Refer back to pulmonary.

## 2016-05-20 NOTE — Assessment & Plan Note (Signed)
Controlled on protonix.   

## 2016-05-21 NOTE — Telephone Encounter (Signed)
Pt scheduled with Dr. Belia HemanKasa at next available consult slot.  Nothing further needed.

## 2016-05-21 NOTE — Telephone Encounter (Signed)
Dr. Belia HemanKasa please advise if you're ok with taking on this patient.  Thanks!

## 2016-05-21 NOTE — Telephone Encounter (Signed)
Yes, that's ok.

## 2016-05-26 NOTE — Telephone Encounter (Signed)
Per [facility] patient has been set up for CPAP with her daughter.

## 2016-06-01 ENCOUNTER — Ambulatory Visit: Payer: Medicare Other

## 2016-06-02 ENCOUNTER — Ambulatory Visit (INDEPENDENT_AMBULATORY_CARE_PROVIDER_SITE_OTHER): Payer: Medicare Other | Admitting: Internal Medicine

## 2016-06-02 ENCOUNTER — Encounter: Payer: Self-pay | Admitting: Internal Medicine

## 2016-06-02 VITALS — BP 128/82 | HR 96 | Wt 189.0 lb

## 2016-06-02 DIAGNOSIS — J449 Chronic obstructive pulmonary disease, unspecified: Secondary | ICD-10-CM

## 2016-06-02 DIAGNOSIS — J441 Chronic obstructive pulmonary disease with (acute) exacerbation: Secondary | ICD-10-CM

## 2016-06-02 MED ORDER — PREDNISONE 20 MG PO TABS: 40.0000 mg | ORAL_TABLET | Freq: Every day | ORAL | 0 refills | Status: DC

## 2016-06-02 NOTE — Progress Notes (Signed)
   Subjective:    [name], female    DOB: 1926-08-30, 83 y.o.   MRN: [id number]  Synopsis: [facility] in 2015 for chronic obstructive asthma. She never smoked.  HPI Chief Complaint  Patient presents with  . Advice Only    prod cough w/white mucus: SOB w/activity; wheezing, chest tightness   Patient  Has increased WOB and increased Wheezing Has been taking her Advair only once daily and as needed She was having some dyspnea and mucus production and this medication helped.  She doesn't feel as strong now as she did in 2015.   She uses albuterol only when she is sick.     Past Medical History:  Diagnosis Date  . Diabetes mellitus (HCC)   . GERD (gastroesophageal reflux disease)    hiatal hernia  . Gout   . Hematuria    followed by urology  . History of colon polyps   . History of frequent urinary tract infections   . Humeral fracture 9/09   comminuted impacted proximal  . Hypercholesterolemia   . Hypertension   . Peripheral vascular disease (H[facility])     Review of Systems  Constitutional: Negative for chills, fatigue and fever.  HENT: Negative for postnasal drip, rhinorrhea and sinus pressure.   Respiratory: Positive for shortness of breath and wheezing. Negative for cough.   Cardiovascular: Negative for chest pain, palpitations and leg swelling.       Objective   Physical Exam Vitals:   06/02/16 1545 06/02/16 1551  BP:  128/82  BP Location:  Right Arm  Cuff Size:  Normal  Pulse:  96  SpO2:  96%  Weight: 189 lb (85.7 kg)     RA  Gen: well appearing HENT: OP clear, TM's clear, neck supple PULM: +Wheezing  normal respiratory effort, good air movement CV: RRR, no mgr, trace edema GI: BS+, soft, nontender Derm: no cyanosis or rash Psyche: normal mood and affect   11/10/2013 chest x-ray normal  06/2011 PFT [facility]> Ratio 70%, Small Airways disease on loop, FEV1 1.63L (93% pred), FEF 25-75% 0.90 (68%  pred), TLC 4.81 L (98% pred), DLCO 9.5 (44% pred) 07/2013 PFT > Loop> SMALL AIRWAYS DISEASE; Ratio 70%, FEV1 1.49L (81% pred, 1% change with BD), FEF 25-75% 0.83 (42% pred), TLC 4.59 L (90% pred), DLCO 10.8 (50% pred) 02/2014 6MW 490 feet, 95% RA     Assessment & Plan:   83 yo white female with Mild/Mod Gold Stage B Chronic obstructive airway disease with asthma, now with acute mild acute COPD exacerbation  1.continue Advair 500 but increase to twice daily 2.Use albuterol as needed 3.start prednisone 40 mg daily for 7 days   Follow up in 3 months  I have personally obtained a history, examined the patient, evaluated Pertinent laboratory and RadioGraphic/imaging results, and  formulated the assessment and plan   The Patient requires high complexity decision making for assessment and support, frequent evaluation and titration of therapies.  Patient/Family are satisfied with Plan of action and management. All questions answered  Lucie LeatherKurian David Briahnna Harries, M.D.  [facility]  Medical Director [facility] Medical Director [facility] Card[facility] Department

## 2016-06-02 NOTE — Patient Instructions (Signed)
PREDNISONE 40 MG DAILY FOR 7 DAYS Check ONO TAKE ADVAIR TWICE DAILY

## 2016-06-08 ENCOUNTER — Ambulatory Visit: Payer: Medicare Other

## 2016-06-09 ENCOUNTER — Ambulatory Visit: Payer: Medicare Other

## 2016-06-09 ENCOUNTER — Other Ambulatory Visit: Payer: Medicare Other

## 2016-06-15 ENCOUNTER — Other Ambulatory Visit (INDEPENDENT_AMBULATORY_CARE_PROVIDER_SITE_OTHER): Payer: Medicare Other

## 2016-06-15 ENCOUNTER — Ambulatory Visit (INDEPENDENT_AMBULATORY_CARE_PROVIDER_SITE_OTHER): Payer: Medicare Other

## 2016-06-15 DIAGNOSIS — E7800 Pure hypercholesterolemia, unspecified: Secondary | ICD-10-CM

## 2016-06-15 DIAGNOSIS — E1151 Type 2 diabetes mellitus with diabetic peripheral angiopathy without gangrene: Secondary | ICD-10-CM

## 2016-06-15 DIAGNOSIS — E538 Deficiency of other specified B group vitamins: Secondary | ICD-10-CM | POA: Diagnosis not present

## 2016-06-15 DIAGNOSIS — D649 Anemia, unspecified: Secondary | ICD-10-CM | POA: Diagnosis not present

## 2016-06-15 LAB — CBC WITH DIFFERENTIAL/PLATELET
BASOS PCT: 0.4 % (ref 0.0–3.0)
Basophils Absolute: 0 10*3/uL (ref 0.0–0.1)
EOS ABS: 0.4 10*3/uL (ref 0.0–0.7)
Eosinophils Relative: 3.1 % (ref 0.0–5.0)
HEMATOCRIT: 36.6 % (ref 36.0–46.0)
Hemoglobin: 11.7 g/dL — ABNORMAL LOW (ref 12.0–15.0)
LYMPHS ABS: 3.2 10*3/uL (ref 0.7–4.0)
LYMPHS PCT: 27.4 % (ref 12.0–46.0)
MCHC: 31.9 g/dL (ref 30.0–36.0)
MCV: 89.8 fl (ref 78.0–100.0)
MONO ABS: 1.2 10*3/uL — AB (ref 0.1–1.0)
Monocytes Relative: 10.1 % (ref 3.0–12.0)
NEUTROS ABS: 6.8 10*3/uL (ref 1.4–7.7)
NEUTROS PCT: 59 % (ref 43.0–77.0)
PLATELETS: 283 10*3/uL (ref 150.0–400.0)
RBC: 4.07 Mil/uL (ref 3.87–5.11)
RDW: 15.8 % — AB (ref 11.5–15.5)
WBC: 11.6 10*3/uL — ABNORMAL HIGH (ref 4.0–10.5)

## 2016-06-15 LAB — HEPATIC FUNCTION PANEL
ALBUMIN: 3.5 g/dL (ref 3.5–5.2)
ALT: 11 U/L (ref 0–35)
AST: 12 U/L (ref 0–37)
Alkaline Phosphatase: 52 U/L (ref 39–117)
Bilirubin, Direct: 0.1 mg/dL (ref 0.0–0.3)
Total Bilirubin: 0.6 mg/dL (ref 0.2–1.2)
Total Protein: 6.1 g/dL (ref 6.0–8.3)

## 2016-06-15 LAB — LIPID PANEL
CHOLESTEROL: 130 mg/dL (ref 0–200)
HDL: 41.5 mg/dL (ref >39.00)
LDL Cholesterol: 68 mg/dL (ref 0–99)
NonHDL: 88.83
TRIGLYCERIDES: 105 mg/dL (ref 0.0–149.0)
Total CHOL/HDL Ratio: 3
VLDL: 21 mg/dL (ref 0.0–40.0)

## 2016-06-15 LAB — BASIC METABOLIC PANEL
BUN: 25 mg/dL — AB (ref 6–23)
CALCIUM: 9.1 mg/dL (ref 8.4–10.5)
CO2: 29 meq/L (ref 19–32)
Chloride: 102 mEq/L (ref 96–112)
Creatinine, Ser: 1.32 mg/dL — ABNORMAL HIGH (ref 0.40–1.20)
GFR: 40.41 mL/min — ABNORMAL LOW (ref >60.00)
Glucose, Bld: 141 mg/dL — ABNORMAL HIGH (ref 70–99)
POTASSIUM: 4.6 meq/L (ref 3.5–5.1)
SODIUM: 138 meq/L (ref 135–145)

## 2016-06-15 LAB — HEMOGLOBIN A1C: HEMOGLOBIN A1C: 7.4 % — AB (ref 4.6–6.5)

## 2016-06-15 LAB — FERRITIN: Ferritin: 26.6 ng/mL (ref 10.0–291.0)

## 2016-06-16 ENCOUNTER — Encounter: Payer: Self-pay | Admitting: Internal Medicine

## 2016-06-16 MED ORDER — CYANOCOBALAMIN 1000 MCG/ML IJ SOLN
1000.0000 ug | Freq: Once | INTRAMUSCULAR | Status: AC
  Administered 2016-06-15: 1000 ug via INTRAMUSCULAR

## 2016-06-16 NOTE — Progress Notes (Addendum)
Patient comes in for B 12 injection.  Injected left deltoid.  Patient tolerated injection well.   Reviewed.  Dr Scott 

## 2016-07-06 ENCOUNTER — Emergency Department: Payer: Medicare Other

## 2016-07-06 ENCOUNTER — Ambulatory Visit (INDEPENDENT_AMBULATORY_CARE_PROVIDER_SITE_OTHER): Payer: Medicare Other | Admitting: Family

## 2016-07-06 ENCOUNTER — Encounter: Payer: Self-pay | Admitting: Family

## 2016-07-06 ENCOUNTER — Inpatient Hospital Stay
Admission: EM | Admit: 2016-07-06 | Discharge: 2016-07-09 | DRG: 190 | Disposition: A | Discharge status: Home Health | Payer: Medicare Other | Attending: Internal Medicine | Admitting: Internal Medicine

## 2016-07-06 ENCOUNTER — Encounter: Payer: Self-pay | Admitting: Emergency Medicine

## 2016-07-06 VITALS — BP 136/78 | HR 106 | Temp 98.0°F | Ht 66.0 in | Wt 182.8 lb

## 2016-07-06 DIAGNOSIS — M109 Gout, unspecified: Secondary | ICD-10-CM | POA: Diagnosis present

## 2016-07-06 DIAGNOSIS — J40 Bronchitis, not specified as acute or chronic: Secondary | ICD-10-CM | POA: Insufficient documentation

## 2016-07-06 DIAGNOSIS — K219 Gastro-esophageal reflux disease without esophagitis: Secondary | ICD-10-CM | POA: Diagnosis present

## 2016-07-06 DIAGNOSIS — J441 Chronic obstructive pulmonary disease with (acute) exacerbation: Secondary | ICD-10-CM | POA: Diagnosis present

## 2016-07-06 DIAGNOSIS — J9601 Acute respiratory failure with hypoxia: Secondary | ICD-10-CM | POA: Diagnosis not present

## 2016-07-06 DIAGNOSIS — N183 Chronic kidney disease, stage 3 (moderate): Secondary | ICD-10-CM | POA: Diagnosis present

## 2016-07-06 DIAGNOSIS — N179 Acute kidney failure, unspecified: Secondary | ICD-10-CM | POA: Diagnosis present

## 2016-07-06 DIAGNOSIS — J45901 Unspecified asthma with (acute) exacerbation: Secondary | ICD-10-CM | POA: Diagnosis present

## 2016-07-06 DIAGNOSIS — J440 Chronic obstructive pulmonary disease with acute lower respiratory infection: Secondary | ICD-10-CM | POA: Diagnosis present

## 2016-07-06 DIAGNOSIS — Z823 Family history of stroke: Secondary | ICD-10-CM | POA: Diagnosis not present

## 2016-07-06 DIAGNOSIS — E785 Hyperlipidemia, unspecified: Secondary | ICD-10-CM | POA: Diagnosis present

## 2016-07-06 DIAGNOSIS — Z7951 Long term (current) use of inhaled steroids: Secondary | ICD-10-CM | POA: Diagnosis not present

## 2016-07-06 DIAGNOSIS — R0902 Hypoxemia: Secondary | ICD-10-CM | POA: Diagnosis not present

## 2016-07-06 DIAGNOSIS — R0602 Shortness of breath: Secondary | ICD-10-CM | POA: Diagnosis not present

## 2016-07-06 DIAGNOSIS — K449 Diaphragmatic hernia without obstruction or gangrene: Secondary | ICD-10-CM | POA: Diagnosis present

## 2016-07-06 DIAGNOSIS — R2681 Unsteadiness on feet: Secondary | ICD-10-CM | POA: Diagnosis present

## 2016-07-06 DIAGNOSIS — Z6829 Body mass index (BMI) 29.0-29.9, adult: Secondary | ICD-10-CM

## 2016-07-06 DIAGNOSIS — J209 Acute bronchitis, unspecified: Secondary | ICD-10-CM | POA: Diagnosis present

## 2016-07-06 DIAGNOSIS — E7800 Pure hypercholesterolemia, unspecified: Secondary | ICD-10-CM | POA: Diagnosis present

## 2016-07-06 DIAGNOSIS — Z87440 Personal history of urinary (tract) infections: Secondary | ICD-10-CM

## 2016-07-06 DIAGNOSIS — E1122 Type 2 diabetes mellitus with diabetic chronic kidney disease: Secondary | ICD-10-CM | POA: Diagnosis present

## 2016-07-06 DIAGNOSIS — J9621 Acute and chronic respiratory failure with hypoxia: Secondary | ICD-10-CM | POA: Diagnosis present

## 2016-07-06 DIAGNOSIS — Z9049 Acquired absence of other specified parts of digestive tract: Secondary | ICD-10-CM | POA: Diagnosis not present

## 2016-07-06 DIAGNOSIS — E1151 Type 2 diabetes mellitus with diabetic peripheral angiopathy without gangrene: Secondary | ICD-10-CM | POA: Diagnosis present

## 2016-07-06 DIAGNOSIS — R000 Tachycardia, unspecified: Secondary | ICD-10-CM | POA: Diagnosis present

## 2016-07-06 DIAGNOSIS — E039 Hypothyroidism, unspecified: Secondary | ICD-10-CM | POA: Diagnosis present

## 2016-07-06 DIAGNOSIS — R262 Difficulty in walking, not elsewhere classified: Secondary | ICD-10-CM

## 2016-07-06 DIAGNOSIS — Z8249 Family history of ischemic heart disease and other diseases of the circulatory system: Secondary | ICD-10-CM | POA: Diagnosis not present

## 2016-07-06 DIAGNOSIS — I129 Hypertensive chronic kidney disease with stage 1 through stage 4 chronic kidney disease, or unspecified chronic kidney disease: Secondary | ICD-10-CM | POA: Diagnosis present

## 2016-07-06 DIAGNOSIS — R05 Cough: Secondary | ICD-10-CM | POA: Diagnosis not present

## 2016-07-06 DIAGNOSIS — N319 Neuromuscular dysfunction of bladder, unspecified: Secondary | ICD-10-CM | POA: Diagnosis present

## 2016-07-06 DIAGNOSIS — M6281 Muscle weakness (generalized): Secondary | ICD-10-CM

## 2016-07-06 HISTORY — DX: Unspecified chronic bronchitis: J42

## 2016-07-06 HISTORY — DX: Unspecified asthma, uncomplicated: J45.909

## 2016-07-06 LAB — BASIC METABOLIC PANEL
ANION GAP: 11 (ref 5–15)
ANION GAP: 10 (ref 5–15)
BUN: 34 mg/dL — ABNORMAL HIGH (ref 6–20)
BUN: 34 mg/dL — ABNORMAL HIGH (ref 6–20)
CHLORIDE: 100 mmol/L — AB (ref 101–111)
CHLORIDE: 104 mmol/L (ref 101–111)
CO2: 26 mmol/L (ref 22–32)
CO2: 25 mmol/L (ref 22–32)
CREATININE: 1.89 mg/dL — AB (ref 0.44–1.00)
CREATININE: 1.69 mg/dL — AB (ref 0.44–1.00)
Calcium: 9.8 mg/dL (ref 8.9–10.3)
Calcium: 9 mg/dL (ref 8.9–10.3)
GFR calc non Af Amer: 23 mL/min — ABNORMAL LOW (ref >60)
GFR calc non Af Amer: 26 mL/min — ABNORMAL LOW (ref >60)
GFR, EST AFRICAN AMERICAN: 26 mL/min — AB (ref >60)
GFR, EST AFRICAN AMERICAN: 30 mL/min — AB (ref >60)
Glucose, Bld: 252 mg/dL — ABNORMAL HIGH (ref 65–99)
Glucose, Bld: 181 mg/dL — ABNORMAL HIGH (ref 65–99)
POTASSIUM: 4.2 mmol/L (ref 3.5–5.1)
Potassium: 3.9 mmol/L (ref 3.5–5.1)
SODIUM: 137 mmol/L (ref 135–145)
SODIUM: 139 mmol/L (ref 135–145)

## 2016-07-06 LAB — CBC
HCT: 39.3 % (ref 35.0–47.0)
HEMOGLOBIN: 12.3 g/dL (ref 12.0–16.0)
MCH: 28.2 pg (ref 26.0–34.0)
MCHC: 31.4 g/dL — ABNORMAL LOW (ref 32.0–36.0)
MCV: 89.8 fL (ref 80.0–100.0)
PLATELETS: 387 10*3/uL (ref 150–440)
RBC: 4.37 MIL/uL (ref 3.80–5.20)
RDW: 15.6 % — ABNORMAL HIGH (ref 11.5–14.5)
WBC: 12 10*3/uL — AB (ref 3.6–11.0)

## 2016-07-06 LAB — INFLUENZA PANEL BY PCR (TYPE A & B)
Influenza A By PCR: NEGATIVE
Influenza B By PCR: NEGATIVE

## 2016-07-06 LAB — GLUCOSE, CAPILLARY
Glucose-Capillary: 293 mg/dL — ABNORMAL HIGH (ref 65–99)
Glucose-Capillary: 331 mg/dL — ABNORMAL HIGH (ref 65–99)

## 2016-07-06 MED ORDER — METHENAMINE HIPPURATE 1 G PO TABS
1.0000 g | ORAL_TABLET | Freq: Every day | ORAL | Status: DC
  Administered 2016-07-07 – 2016-07-09 (×3): 1 g via ORAL
  Filled 2016-07-06 (×4): qty 1

## 2016-07-06 MED ORDER — ALLOPURINOL 100 MG PO TABS
100.0000 mg | ORAL_TABLET | Freq: Every day | ORAL | Status: DC
  Administered 2016-07-07 – 2016-07-09 (×3): 100 mg via ORAL
  Filled 2016-07-06 (×3): qty 1

## 2016-07-06 MED ORDER — INSULIN ASPART 100 UNIT/ML ~~LOC~~ SOLN
0.0000 [IU] | Freq: Every day | SUBCUTANEOUS | Status: DC
  Administered 2016-07-06: 4 [IU] via SUBCUTANEOUS
  Filled 2016-07-06: qty 4

## 2016-07-06 MED ORDER — IPRATROPIUM-ALBUTEROL 0.5-2.5 (3) MG/3ML IN SOLN
3.0000 mL | Freq: Four times a day (QID) | RESPIRATORY_TRACT | Status: DC
  Administered 2016-07-06 – 2016-07-07 (×4): 3 mL via RESPIRATORY_TRACT
  Filled 2016-07-06 (×5): qty 3

## 2016-07-06 MED ORDER — IPRATROPIUM-ALBUTEROL 0.5-2.5 (3) MG/3ML IN SOLN
3.0000 mL | Freq: Once | RESPIRATORY_TRACT | Status: AC
  Administered 2016-07-06: 3 mL via RESPIRATORY_TRACT
  Filled 2016-07-06: qty 3

## 2016-07-06 MED ORDER — FLUOXETINE HCL 20 MG PO CAPS
20.0000 mg | ORAL_CAPSULE | Freq: Every day | ORAL | Status: DC
  Administered 2016-07-07 – 2016-07-09 (×3): 20 mg via ORAL
  Filled 2016-07-06 (×3): qty 1

## 2016-07-06 MED ORDER — PANTOPRAZOLE SODIUM 40 MG PO TBEC
40.0000 mg | DELAYED_RELEASE_TABLET | Freq: Every day | ORAL | Status: DC
  Administered 2016-07-07 – 2016-07-09 (×3): 40 mg via ORAL
  Filled 2016-07-06 (×3): qty 1

## 2016-07-06 MED ORDER — ACETAMINOPHEN 650 MG RE SUPP: 650.0000 mg | Freq: Four times a day (QID) | RECTAL | Status: DC | PRN

## 2016-07-06 MED ORDER — LEVOTHYROXINE SODIUM 50 MCG PO TABS
50.0000 ug | ORAL_TABLET | Freq: Every day | ORAL | Status: DC
  Administered 2016-07-08 – 2016-07-09 (×2): 50 ug via ORAL
  Filled 2016-07-06 (×3): qty 1

## 2016-07-06 MED ORDER — BUDESONIDE 0.5 MG/2ML IN SUSP
0.5000 mg | Freq: Two times a day (BID) | RESPIRATORY_TRACT | Status: DC
  Administered 2016-07-06 – 2016-07-08 (×5): 0.5 mg via RESPIRATORY_TRACT
  Filled 2016-07-06 (×6): qty 2

## 2016-07-06 MED ORDER — SODIUM CHLORIDE 0.9 % IV SOLN
INTRAVENOUS | Status: DC
  Administered 2016-07-06: 18:00:00 via INTRAVENOUS

## 2016-07-06 MED ORDER — METHYLPREDNISOLONE SODIUM SUCC 125 MG IJ SOLR
125.0000 mg | Freq: Once | INTRAMUSCULAR | Status: AC
  Administered 2016-07-06: 125 mg via INTRAVENOUS
  Filled 2016-07-06: qty 2

## 2016-07-06 MED ORDER — SODIUM CHLORIDE 0.9 % IV BOLUS (SEPSIS)
1000.0000 mL | Freq: Once | INTRAVENOUS | Status: AC
  Administered 2016-07-06: 1000 mL via INTRAVENOUS

## 2016-07-06 MED ORDER — LORATADINE 10 MG PO TABS
10.0000 mg | ORAL_TABLET | Freq: Every day | ORAL | Status: DC
Start: 2016-07-07 — End: 2016-07-09
  Administered 2016-07-07 – 2016-07-09 (×3): 10 mg via ORAL
  Filled 2016-07-06 (×3): qty 1

## 2016-07-06 MED ORDER — AZITHROMYCIN 500 MG PO TABS
500.0000 mg | ORAL_TABLET | Freq: Once | ORAL | Status: AC
  Administered 2016-07-06: 500 mg via ORAL
  Filled 2016-07-06: qty 1

## 2016-07-06 MED ORDER — OXYBUTYNIN CHLORIDE ER 5 MG PO TB24
5.0000 mg | ORAL_TABLET | Freq: Every day | ORAL | Status: DC
  Administered 2016-07-07 – 2016-07-08 (×2): 5 mg via ORAL
  Filled 2016-07-06 (×3): qty 1

## 2016-07-06 MED ORDER — ENOXAPARIN SODIUM 30 MG/0.3ML ~~LOC~~ SOLN
30.0000 mg | SUBCUTANEOUS | Status: DC
  Administered 2016-07-06 – 2016-07-07 (×2): 30 mg via SUBCUTANEOUS
  Filled 2016-07-06 (×2): qty 0.3

## 2016-07-06 MED ORDER — SIMVASTATIN 20 MG PO TABS
20.0000 mg | ORAL_TABLET | Freq: Every day | ORAL | Status: DC
  Administered 2016-07-06 – 2016-07-08 (×3): 20 mg via ORAL
  Filled 2016-07-06 (×3): qty 1

## 2016-07-06 MED ORDER — AZITHROMYCIN 250 MG PO TABS
250.0000 mg | ORAL_TABLET | Freq: Every day | ORAL | Status: DC
  Administered 2016-07-07 – 2016-07-09 (×3): 250 mg via ORAL
  Filled 2016-07-06 (×3): qty 1

## 2016-07-06 MED ORDER — INSULIN ASPART 100 UNIT/ML ~~LOC~~ SOLN
0.0000 [IU] | Freq: Three times a day (TID) | SUBCUTANEOUS | Status: DC
  Administered 2016-07-06: 5 [IU] via SUBCUTANEOUS
  Administered 2016-07-07: 3 [IU] via SUBCUTANEOUS
  Administered 2016-07-07 (×2): 2 [IU] via SUBCUTANEOUS
  Administered 2016-07-08: 3 [IU] via SUBCUTANEOUS
  Administered 2016-07-08: 9 [IU] via SUBCUTANEOUS
  Administered 2016-07-08: 1 [IU] via SUBCUTANEOUS
  Administered 2016-07-09: 2 [IU] via SUBCUTANEOUS
  Filled 2016-07-06: qty 2
  Filled 2016-07-06: qty 1
  Filled 2016-07-06: qty 9
  Filled 2016-07-06: qty 5
  Filled 2016-07-06: qty 3
  Filled 2016-07-06: qty 2
  Filled 2016-07-06: qty 3
  Filled 2016-07-06: qty 2

## 2016-07-06 MED ORDER — ACETAMINOPHEN 325 MG PO TABS: 650.0000 mg | ORAL_TABLET | Freq: Four times a day (QID) | ORAL | Status: DC | PRN

## 2016-07-06 MED ORDER — METHYLPREDNISOLONE SODIUM SUCC 125 MG IJ SOLR
60.0000 mg | Freq: Every day | INTRAMUSCULAR | Status: DC
  Administered 2016-07-07 – 2016-07-08 (×2): 60 mg via INTRAVENOUS
  Filled 2016-07-06 (×2): qty 2

## 2016-07-06 MED ORDER — MONTELUKAST SODIUM 10 MG PO TABS
10.0000 mg | ORAL_TABLET | Freq: Every day | ORAL | Status: DC
Start: 2016-07-06 — End: 2016-07-09
  Administered 2016-07-06 – 2016-07-08 (×3): 10 mg via ORAL
  Filled 2016-07-06 (×3): qty 1

## 2016-07-06 MED ORDER — VITAMIN D 1000 UNITS PO TABS
1000.0000 [IU] | ORAL_TABLET | Freq: Every day | ORAL | Status: DC
  Administered 2016-07-07 – 2016-07-09 (×3): 1000 [IU] via ORAL
  Filled 2016-07-06 (×3): qty 1

## 2016-07-06 MED ORDER — CALCIUM CARBONATE-VITAMIN D 500-200 MG-UNIT PO TABS
1.0000 | ORAL_TABLET | Freq: Every day | ORAL | Status: DC
  Administered 2016-07-07 – 2016-07-09 (×3): 1 via ORAL
  Filled 2016-07-06 (×3): qty 1

## 2016-07-06 NOTE — ED Notes (Signed)
Pt ambulated to test oxygen levels with exertion. When sitting pts HR was 102 and oxygen saturation is 95% RA. PT has expiratory wheezing before standing. Upon standing, stats dropped to 86% and HR went to 114. Pt reports feeling "woozy" upon standing and was swaying from side to side.

## 2016-07-06 NOTE — Progress Notes (Signed)
Pre visit review using our clinic review tool, if applicable. No additional management support is needed unless otherwise documented below in the visit note. 

## 2016-07-06 NOTE — ED Triage Notes (Signed)
Pt to ED c/o cough, congestion and sob.  Daughter states patient coming from [place] this morning and sent here d/t decreased breath sounds, wheezing.  Hx of chronic bronchitis and asthma.  [facility]  Pt presents tachypnic, labored breathing, 95% RA, skin warm and dry, wheezing throughout.

## 2016-07-06 NOTE — Progress Notes (Signed)
Subjective:    [name], female    DOB: 1926/06/01, 83 y.o.   MRN: [id number]  CC: Diamond Newman is a 83 y.o. female who presents today for an acute visit.    HPI: Chief complaint cough x 2 weeks, waxing and waning.Accompanied by daughter.    Generally not feeling well over the past couple of weeks, unchanged. Coughing has improved the last couple of days. Endorses fatigue, weak, SOB.    Has been taking mucinex QD, which helps break up congestion. Using adviar only QD, forgetting to use at night.   Using walker. No falls.   History of asthma. Nonsmoker  Follows with pulmonology, Dr. Dorris Fetchasa. Had a recent COPD exacerbation 1 month ago. Treated with Prednisone taper however no significant improvement . Advair increased to twice daily. Albuterol PRN    HISTORY:  Past Medical History:  Diagnosis Date  . Diabetes mellitus (HCC)   . GERD (gastroesophageal reflux disease)    hiatal hernia  . Gout   . Hematuria    followed by urology  . History of colon polyps   . History of frequent urinary tract infections   . Humeral fracture 9/09   comminuted impacted proximal  . Hypercholesterolemia   . Hypertension   . Peripheral vascular disease [facility](HCC)    Past Surgical History:  Procedure Laterality Date  . BLADDER SUSPENSION  12/08/99  . BREAST BIOPSY Right 1960's  . CHOLECYSTECTOMY  1994   Family History  Problem Relation Age of Onset  . Hypertension Father   . Hypertension Mother   . Colitis Mother   . Breast cancer Neg Hx   . Colon cancer Neg Hx     Allergies: Ace inhibitors; Amoxicillin; Codeine; Sulfa antibiotics; Tetracyclines & related; Vibramycin [doxycycline calcium]; and Ciprofloxacin hcl Current Outpatient Prescriptions on File Prior to Visit  Medication Sig Dispense Refill  . albuterol (PROVENTIL HFA;VENTOLIN HFA) 108 (90 BASE) MCG/ACT inhaler Inhale 2 puffs into the lungs every 6 (six) hours as needed for wheezing or shortness of breath. 1 Inhaler 1  .  allopurinol (ZYLOPRIM) 100 MG tablet Take 1 tablet (100 mg total) by mouth daily. 30 tablet 11  . Calcium Carbonate-Vitamin D (CALCIUM 600+D) 600-400 MG-UNIT per tablet Take 1 tablet by mouth daily.     . cetirizine (ZYRTEC) 10 MG tablet Take 10 mg by mouth daily.    . Cholecalciferol (VITAMIN D-3) 1000 UNITS CAPS Take 1 capsule by mouth daily.    . Cranberry-Cholecalciferol 4200-500 MG-UNIT CAPS Take by mouth.    Marland Kitchen. FLUoxetine (PROZAC) 20 MG capsule Take 1 capsule (20 mg total) by mouth daily. 30 capsule 1  . Fluticasone-Salmeterol (ADVAIR DISKUS) 250-50 MCG/DOSE AEPB Inhale 1 puff into the lungs 2 (two) times daily. 60 each 3  . levothyroxine (SYNTHROID, LEVOTHROID) 50 MCG tablet Take 1 tablet (50 mcg total) by mouth daily. 30 tablet 11  . methenamine (HIPREX) 1 g tablet Take 1 tablet (1 g total) by mouth daily. 30 tablet 0  . montelukast (SINGULAIR) 10 MG tablet Take 1 tablet (10 mg total) by mouth at bedtime. 30 tablet 5  . pantoprazole (PROTONIX) 40 MG tablet Take 1 tablet (40 mg total) by mouth daily. 30 tablet 5  . predniSONE (DELTASONE) 20 MG tablet Take 2 tablets (40 mg total) by mouth daily with breakfast. 14 tablet 0  . simvastatin (ZOCOR) 20 MG tablet Take 1 tablet (20 mg total) by mouth daily. 30 tablet 11  . tolterodine (DETROL) 2  MG tablet Take 1 tablet (2 mg total) by mouth 2 (two) times daily. 60 tablet 2   No current facility-administered medications on file prior to visit.     Social History  Substance Use Topics  . Smoking status: Never Smoker  . Smokeless tobacco: Never Used  . Alcohol use No    Review of Systems  Constitutional: Positive for fatigue. Negative for chills and fever.  HENT: Positive for congestion. Negative for sinus pressure and sore throat.   Eyes: Negative for visual disturbance.  Respiratory: Positive for cough and shortness of breath. Negative for wheezing.   Cardiovascular: Negative for chest pain and palpitations.  Gastrointestinal: Negative  for nausea and vomiting.  Neurological: Negative for headaches.      Objective:    BP 136/78   Pulse (!) 106   Temp 98 F (36.7 C) (Oral)   Ht 5\' 6"  (1.676 m)   Wt 182 lb 12.8 oz (82.9 kg)   SpO2 93%   BMI 29.50 kg/m    Physical Exam  Constitutional: She appears well-developed and well-nourished.  HENT:  Head: Normocephalic and atraumatic.  Right Ear: Hearing, tympanic membrane, external ear and ear canal normal. No drainage, swelling or tenderness. No foreign bodies. Tympanic membrane is not erythematous and not bulging. No middle ear effusion. No decreased hearing is noted.  Left Ear: Hearing, tympanic membrane, external ear and ear canal normal. No drainage, swelling or tenderness. No foreign bodies. Tympanic membrane is not erythematous and not bulging.  No middle ear effusion. No decreased hearing is noted.  Nose: Nose normal. No rhinorrhea. Right sinus exhibits no maxillary sinus tenderness and no frontal sinus tenderness. Left sinus exhibits no maxillary sinus tenderness and no frontal sinus tenderness.  Mouth/Throat: Uvula is midline, oropharynx is clear and moist and mucous membranes are normal. No oropharyngeal exudate, posterior oropharyngeal edema, posterior oropharyngeal erythema or tonsillar abscesses.  Eyes: Conjunctivae are normal.  Cardiovascular: Regular rhythm, normal heart sounds and normal pulses.   Pulmonary/Chest: Effort normal. She has decreased breath sounds in the right lower field and the left lower field. She has no wheezes. She has no rhonchi. She has no rales.  Lymphadenopathy:       Head (right side): No submental, no submandibular, no tonsillar, no preauricular, no posterior auricular and no occipital adenopathy present.       Head (left side): No submental, no submandibular, no tonsillar, no preauricular, no posterior auricular and no occipital adenopathy present.    She has no cervical adenopathy.  Neurological: She is alert.  Skin: Skin is warm and  dry.  Psychiatric: She has a normal mood and affect. Her speech is normal and behavior is normal. Thought content normal.  Vitals reviewed.  Patient didn't feel better after albuterol treatment. Lung sounds remain diminished.      Assessment & Plan:   1. Hypoxia Patient appears very labored when talking in the room. Sitting sa02 93. when walking with SaO2 with nurse, unable to get a reading of SaO2. Patient lips appear pale and overall doesn't look well.  I'm concerned for acute respiratory failure, hypoxia in the context of her history of airway disease with asthma. Patient, daughter, and I jointly agreed for patient to go to [facility] ED. Declined 911 transport. Triage report given    I am having Diamond Newman maintain her Calcium Carbonate-Vitamin D, Vitamin D-3, Cranberry-Cholecalciferol, cetirizine, albuterol, simvastatin, Fluticasone-Salmeterol, allopurinol, levothyroxine, pantoprazole, montelukast, tolterodine, methenamine, FLUoxetine, and predniSONE.   No orders of the defined types  were placed in this encounter.   Return precautions given.   Risks, benefits, and alternatives of the medications and treatment plan prescribed today were discussed, and patient expressed understanding.   Education regarding symptom management and diagnosis given to patient on AVS.  Continue to follow with Dale [place], MD for routine health maintenance.   Diamond Apa and I agreed with plan.   Rennie Plowman, FNP

## 2016-07-06 NOTE — ED Notes (Signed)
Pt reports increased fatigue. Decreased appetite and PO intake. Pt repots chills but is unsure if she has had fevers at home. Pt on RA at this time and able to speak in complete sentences without SOB.

## 2016-07-06 NOTE — ED Notes (Signed)
Pt placed on 2L  for comfort.

## 2016-07-06 NOTE — Patient Instructions (Signed)
Advised patient to go immediately to closest emergency department. Patient and I agreed with this plan due to urgent nature of symptoms.     

## 2016-07-06 NOTE — Progress Notes (Signed)
Anticoagulation monitoring(Lovenox):  83 yo female ordered Lovenox 40 mg Q24h  Filed Weights   07/06/16 1156  Weight: 182 lb (82.6 kg)   BMI    Lab Results  Component Value Date   CREATININE 1.69 (H) 07/06/2016   CREATININE 1.89 (H) 07/06/2016   CREATININE 1.32 (H) 06/15/2016   Estimated Creatinine Clearance: 25.4 mL/min (by C-G formula based on SCr of 1.69 mg/dL (H)). Hemoglobin & Hematocrit     Component Value Date/Time   HGB 12.3 07/06/2016 1202   HGB 10.5 (L) 11/11/2013 0414   HCT 39.3 07/06/2016 1202   HCT 32.9 (L) 11/11/2013 0414     Per Protocol for Patient with estCrcl < 30 ml/min and BMI < 40, will transition to Lovenox 30 mg Q24h.

## 2016-07-06 NOTE — ED Provider Notes (Signed)
[facility] Emergency Department Provider Note  ____________________________________________  Time seen: Approximately 12:52 PM  I have reviewed the triage vital signs and the nursing notes.   HISTORY  Chief Complaint Cough and Shortness of Breath   HPI Diamond Newman is a 83 y.o. female with a history of asthma, COPD, diabetes, hypertension, PVD who presents for evaluation of shortness of breath. Patient reports an intermittent cough which has gotten worse in the last week, associated with wheezing, shortness of breath that is worse with minimal exertion, chills, subjective fever, and decreased appetite. Patient reports that all she wants to do is sleep all day, she feels very tired. Patient went to see his primary care doctor today and was given a DuoNeb before being transferred over here. Patient denies chest pain, abdominal pain. She has had a few episodes of diarrhea over the course of the last 2 days, no nausea or vomiting. She has been using her inhalers at home. Patient is not on oxygen at baseline.  Past Medical History:  Diagnosis Date  . Asthma   . Chronic bronchitis (HCC)   . Diabetes mellitus (HCC)   . GERD (gastroesophageal reflux disease)    hiatal hernia  . Gout   . Hematuria    followed by urology  . History of colon polyps   . History of frequent urinary tract infections   . Humeral fracture 9/09   comminuted impacted proximal  . Hypercholesterolemia   . Hypertension   . Peripheral vascular disease [facility])     Patient Active Problem List   Diagnosis Date Noted  . Bronchitis 07/06/2016  . Daytime somnolence 08/03/2015  . Fall against object 12/30/2014  . CKD (chronic kidney disease) stage 3, GFR 30-59 ml/min 11/24/2014  . Abnormal mammogram 01/25/2014  . Muscular deconditioning 12/03/2013  . Leukocytosis 11/27/2013  . Obesity (BMI 30.0-34.9) 10/28/2013  . Chronic obstructive airway disease with asthma (HCC) 08/14/2013  . Lower  extremity edema 08/05/2013  . Cough 04/22/2013  . Vitamin B12 deficiency 07/18/2012  . Osteopenia 05/27/2012  . Vitamin D deficiency 05/27/2012  . Hypertension 05/25/2012  . Hypercholesterolemia 05/25/2012  . Peripheral vascular disease (HCC) 05/25/2012  . History of frequent urinary tract infections 05/25/2012  . GERD (gastroesophageal reflux disease) 05/25/2012  . Diabetes mellitus with peripheral vascular disease (HCC) 05/25/2012  . Gout 05/25/2012    Past Surgical History:  Procedure Laterality Date  . BLADDER SUSPENSION  12/08/99  . BREAST BIOPSY Right 1960's  . CHOLECYSTECTOMY  1994  . KNEE SURGERY Right     Prior to Admission medications   Medication Sig Start Date End Date Taking? Authorizing Provider  albuterol (PROVENTIL HFA;VENTOLIN HFA) 108 (90 BASE) MCG/ACT inhaler Inhale 2 puffs into the lungs every 6 (six) hours as needed for wheezing or shortness of breath. 07/02/14  Yes Dale [place], MD  allopurinol (ZYLOPRIM) 100 MG tablet Take 1 tablet (100 mg total) by mouth daily. 07/07/15  Yes Dale [place], MD  Calcium Carbonate-Vitamin D (CALCIUM 600+D) 600-400 MG-UNIT per tablet Take 1 tablet by mouth daily.    Yes Historical Provider, MD  cetirizine (ZYRTEC) 10 MG tablet Take 10 mg by mouth daily.   Yes Historical Provider, MD  Cholecalciferol (VITAMIN D-3) 1000 UNITS CAPS Take 1 capsule by mouth daily.   Yes Historical Provider, MD  Cranberry-Cholecalciferol 4200-500 MG-UNIT CAPS Take by mouth.   Yes Historical Provider, MD  FLUoxetine (PROZAC) 20 MG capsule Take 1 capsule (20 mg total) by mouth daily. 04/09/16  Yes Dale [place], MD  Fluticasone-Salmeterol (ADVAIR DISKUS) 250-50 MCG/DOSE AEPB Inhale 1 puff into the lungs 2 (two) times daily. 04/01/15  Yes Dale [place], MD  levothyroxine (SYNTHROID, LEVOTHROID) 50 MCG tablet Take 1 tablet (50 mcg total) by mouth daily. 07/07/15  Yes Dale [place], MD  methenamine (HIPREX) 1 g tablet Take 1 tablet (1 g total) by mouth  daily. 04/01/16  Yes Dale [place], MD  montelukast (SINGULAIR) 10 MG tablet Take 1 tablet (10 mg total) by mouth at bedtime. 01/30/16  Yes Dale [place], MD  pantoprazole (PROTONIX) 40 MG tablet Take 1 tablet (40 mg total) by mouth daily. 11/11/15  Yes Dale [place], MD  simvastatin (ZOCOR) 20 MG tablet Take 1 tablet (20 mg total) by mouth daily. 03/31/15  Yes Dale [place], MD  tolterodine (DETROL) 2 MG tablet Take 1 tablet (2 mg total) by mouth 2 (two) times daily. 02/27/16  Yes Dale [place], MD  predniSONE (DELTASONE) 20 MG tablet Take 2 tablets (40 mg total) by mouth daily with breakfast. 06/02/16   Erin Fulling, MD    Allergies Ace inhibitors; Amoxicillin; Codeine; Sulfa antibiotics; Tetracyclines & related; Vibramycin [doxycycline calcium]; and Ciprofloxacin hcl  Family History  Problem Relation Age of Onset  . Hypertension Father   . Hypertension Mother   . Colitis Mother   . Breast cancer Neg Hx   . Colon cancer Neg Hx     Social History Social History  Substance Use Topics  . Smoking status: Never Smoker  . Smokeless tobacco: Never Used  . Alcohol use No    Review of Systems  Constitutional: + fever, chills, generalized weaknees Eyes: Negative for visual changes. ENT: Negative for sore throat. Neck: No neck pain  Cardiovascular: Negative for chest pain. Respiratory: +shortness of breath, cough, and wheezing Gastrointestinal: Negative for abdominal pain, vomiting or diarrhea. Genitourinary: Negative for dysuria. Musculoskeletal: Negative for back pain. Skin: Negative for rash. Neurological: Negative for headaches, weakness or numbness. Psych: No SI or HI  ____________________________________________   PHYSICAL EXAM:  VITAL SIGNS: ED Triage Vitals [07/06/16 1156]  Enc Vitals Group     BP 111/81     Pulse Rate (!) 102     Resp (!) 24     Temp 97.6 F (36.4 C)     Temp Source Oral     SpO2 95 %     Weight 182 lb (82.6 kg)     Height 5\' 6"  (1.676 m)      Head Circumference      Peak Flow      Pain Score      Pain Loc      Pain Edu?      Excl. in GC?     Constitutional: Alert and oriented. Well appearing and in no apparent distress. HEENT:      Head: Normocephalic and atraumatic.         Eyes: Conjunctivae are normal. Sclera is non-icteric. EOMI. PERRL      Mouth/Throat: Mucous membranes are dry.       Neck: Supple with no signs of meningismus. Cardiovascular: Tachycardic with regular rhythm. No murmurs, gallops, or rubs. 2+ symmetrical distal pulses are present in all extremities. No JVD. Respiratory: Increased RR, coarse rhonchi on left and diffuse expiratory wheezes bilaterally. Gastrointestinal: Soft, non tender, and non distended with positive bowel sounds. No rebound or guarding. Genitourinary: No CVA tenderness. Musculoskeletal: Nontender with normal range of motion in all extremities. No edema, cyanosis, or erythema of extremities. Neurologic: Normal speech  and language. Face is symmetric. Moving all extremities. No gross focal neurologic deficits are appreciated. Skin: Skin is warm, dry and intact. No rash noted. Psychiatric: Mood and affect are normal. Speech and behavior are normal.  ____________________________________________   LABS (all labs ordered are listed, but only abnormal results are displayed)  Labs Reviewed  BASIC METABOLIC PANEL - Abnormal; Notable for the following:       Result Value   Chloride 100 (*)    Glucose, Bld 252 (*)    BUN 34 (*)    Creatinine, Ser 1.89 (*)    GFR calc non Af Amer 23 (*)    GFR calc Af Amer 26 (*)    All other components within normal limits  CBC - Abnormal; Notable for the following:    WBC 12.0 (*)    MCHC 31.4 (*)    RDW 15.6 (*)    All other components within normal limits  INFLUENZA PANEL BY PCR (TYPE A & B)  BASIC METABOLIC PANEL   ____________________________________________  EKG  ED ECG REPORT I, Nita Sicklearolina Kieran Arreguin, the attending physician, personally viewed  and interpreted this ECG.  Sinus tachycardia, rate of 110, normal intervals, left axis deviation, no stiff elevations or depressions. ____________________________________________  RADIOLOGY  CXR: Chronic lung changes. No acute overlying pulmonary process. ____________________________________________   PROCEDURES  Procedure(s) performed: None Procedures Critical Care performed: yes  CRITICAL CARE Performed by: Nita Sicklearolina Kabrea Seeney  ?  Total critical care time: 35 min  Critical care time was exclusive of separately billable procedures and treating other patients.  Critical care was necessary to treat or prevent imminent or life-threatening deterioration.  Critical care was time spent personally by me on the following activities: development of treatment plan with patient and/or surrogate as well as nursing, discussions with consultants, evaluation of patient's response to treatment, examination of patient, obtaining history from patient or surrogate, ordering and performing treatments and interventions, ordering and review of laboratory studies, ordering and review of radiographic studies, pulse oximetry and re-evaluation of patient's condition.  ____________________________________________   INITIAL IMPRESSION / ASSESSMENT AND PLAN / ED COURSE  83 y.o. female with a history of asthma, COPD, diabetes, hypertension, PVD who presents for evaluation of shortness of breath, cough or wheezing, chills, generalized weakness. Patient is well-appearing, mildly increased work of breathing, satting well on room air, lungs with diffuse expiratory wheezes and coarse rhonchi on the left. Will get chest x-ray to rule out pneumonia, flu swab, we'll give IV fluids as patient looks dehydrated on exam. We'll give duoneb x 3, solumedrol.  Clinical Course as of Jul 07 1519  Tue Jul 06, 2016  1520 Chest x-ray with no infiltrate. Influenza negative. Mild a KI with creatinine of 1.89. Patient received 3  DuoNeb treatments, IV fluids, azithromycin, and Solu-Medrol. Patient is unable to ambulate without becoming severely dyspneic, desated to 86%. Will admit to Hospitalist  [CV]    Clinical Course User Index [CV] Nita Sicklearolina Bocephus Cali, MD    Pertinent labs & imaging results that were available during my care of the patient were reviewed by me and considered in my medical decision making (see chart for details).    ____________________________________________   FINAL CLINICAL IMPRESSION(S) / ED DIAGNOSES  Final diagnoses:  Acute respiratory failure with hypoxia (HCC)  COPD exacerbation (HCC)      NEW MEDICATIONS STARTED DURING THIS VISIT:  New Prescriptions   No medications on file     Note:  This document was prepared using Dragon voice recognition  software and may include unintentional dictation errors.    Nita Sickle, MD 07/06/16 [phone]

## 2016-07-06 NOTE — Progress Notes (Signed)
Admission:  Patient alert and oriented. SOB on exertion. NO pain at this time. Patient O2 sat 98% on 2LO2. Patient lives at home along but as hired a friend of the family to help her with ADL's for 9am to 3pm and some house keeping. Patient does not use oxygen at home. Daughter is power of attorney and will bring documentation for it to be copied to file, daughter number under emergency contacts. Lun sounds wheezing on auscultation with non-productive cough. RN will teach patient how to use incentive spirometer.   Harvie HeckMelanie Azaya Goedde, RN

## 2016-07-06 NOTE — H&P (Signed)
[facility] [place] at [facility]   PATIENT NAME: Diamond Newman    MR#:  [id number]  DATE OF BIRTH:  Jun 03, 1926  DATE OF ADMISSION:  07/06/2016  PRIMARY CARE PHYSICIAN: Dale [place], MD   REQUESTING/REFERRING PHYSICIAN: Dr Cecil Cobbs  CHIEF COMPLAINT:   Chief Complaint  Patient presents with  . Cough  . Shortness of Breath    HISTORY OF PRESENT ILLNESS:  Diamond Newman  is a 83 y.o. female with a known history of COPD and asthma presents with cough and shortness of breath. For the past month and a half she has not been feeling well. She has had cough and shortness of breath and congestion. She was on a 2 prednisone tapers. She has had poor appetite and feeling very weak and lousy. She's been sleeping a lot. She is unsteady on her feet using her walker. She is dizzy when she gets up. With coughing she does feel some tightness around both sides of her upper abdomen. She saw the nurse practitioner today and sent her to the ER. In the ER when they tried to stand her up to walk around she became hypoxic with a pulse ox of 86% and hospitalist services were contacted for evaluation.  PAST MEDICAL HISTORY:   Past Medical History:  Diagnosis Date  . Asthma   . Chronic bronchitis (HCC)   . Diabetes mellitus (HCC)   . GERD (gastroesophageal reflux disease)    hiatal hernia  . Gout   . Hematuria    followed by urology  . History of colon polyps   . History of frequent urinary tract infections   . Humeral fracture 9/09   comminuted impacted proximal  . Hypercholesterolemia   . Hypertension   . Peripheral vascular disease (HCC)     PAST SURGICAL HISTORY:   Past Surgical History:  Procedure Laterality Date  . BLADDER SUSPENSION  12/08/99  . BREAST BIOPSY Right 1960's  . CHOLECYSTECTOMY  1994  . KNEE SURGERY Right     SOCIAL HISTORY:   Social History  Substance Use Topics  . Smoking status: Never Smoker  . Smokeless tobacco: Never Used  .  Alcohol use No    FAMILY HISTORY:   Family History  Problem Relation Age of Onset  . Hypertension Father   . CVA Father   . CAD Father   . Hypertension Mother   . Colitis Mother   . CAD Mother   . Breast cancer Neg Hx   . Colon cancer Neg Hx     DRUG ALLERGIES:   Allergies  Allergen Reactions  . Ace Inhibitors Cough  . Amoxicillin   . Codeine   . Sulfa Antibiotics Other (See Comments)    GI upset  . Tetracyclines & Related   . Vibramycin [Doxycycline Calcium]   . Ciprofloxacin Hcl Rash    REVIEW OF SYSTEMS:  CONSTITUTIONAL: No fever. Positive for chills. Positive for fatigue and weakness.  EYES: No blurred or double vision.  EARS, NOSE, AND THROAT: No tinnitus or ear pain. No sore throat. Decreased hearing RESPIRATORY: Positive for cough, shortness of breath, and wheezing. No hemoptysis.  CARDIOVASCULAR: No chest pain, orthopnea, edema.  GASTROINTESTINAL: No nausea, vomiting, diarrhea or abdominal pain. No blood in bowel movements GENITOURINARY: No dysuria, hematuria.  ENDOCRINE: No polyuria, nocturia,  HEMATOLOGY: No anemia, easy bruising or bleeding SKIN: No rash or lesion. MUSCULOSKELETAL: No joint pain or arthritis.   NEUROLOGIC: No tingling, numbness. Positive for dizziness PSYCHIATRY: No anxiety or  depression.   MEDICATIONS AT HOME:   Prior to Admission medications   Medication Sig Start Date End Date Taking? Authorizing Provider  albuterol (PROVENTIL HFA;VENTOLIN HFA) 108 (90 BASE) MCG/ACT inhaler Inhale 2 puffs into the lungs every 6 (six) hours as needed for wheezing or shortness of breath. 07/02/14  Yes Dale Durhamharlene Scott, MD  allopurinol (ZYLOPRIM) 100 MG tablet Take 1 tablet (100 mg total) by mouth daily. 07/07/15  Yes Dale Durhamharlene Scott, MD  Calcium Carbonate-Vitamin D (CALCIUM 600+D) 600-400 MG-UNIT per tablet Take 1 tablet by mouth daily.    Yes Historical Provider, MD  cetirizine (ZYRTEC) 10 MG tablet Take 10 mg by mouth daily.   Yes Historical Provider, MD   Cholecalciferol (VITAMIN D-3) 1000 UNITS CAPS Take 1 capsule by mouth daily.   Yes Historical Provider, MD  Cranberry-Cholecalciferol 4200-500 MG-UNIT CAPS Take by mouth.   Yes Historical Provider, MD  FLUoxetine (PROZAC) 20 MG capsule Take 1 capsule (20 mg total) by mouth daily. 04/09/16  Yes Dale Durhamharlene Scott, MD  Fluticasone-Salmeterol (ADVAIR DISKUS) 250-50 MCG/DOSE AEPB Inhale 1 puff into the lungs 2 (two) times daily. 04/01/15  Yes Dale Durhamharlene Scott, MD  levothyroxine (SYNTHROID, LEVOTHROID) 50 MCG tablet Take 1 tablet (50 mcg total) by mouth daily. 07/07/15  Yes Dale Durhamharlene Scott, MD  methenamine (HIPREX) 1 g tablet Take 1 tablet (1 g total) by mouth daily. 04/01/16  Yes Dale Durhamharlene Scott, MD  montelukast (SINGULAIR) 10 MG tablet Take 1 tablet (10 mg total) by mouth at bedtime. 01/30/16  Yes Dale Durhamharlene Scott, MD  pantoprazole (PROTONIX) 40 MG tablet Take 1 tablet (40 mg total) by mouth daily. 11/11/15  Yes Dale Durhamharlene Scott, MD  simvastatin (ZOCOR) 20 MG tablet Take 1 tablet (20 mg total) by mouth daily. 03/31/15  Yes Dale Durhamharlene Scott, MD  tolterodine (DETROL) 2 MG tablet Take 1 tablet (2 mg total) by mouth 2 (two) times daily. 02/27/16  Yes Dale Durhamharlene Scott, MD  predniSONE (DELTASONE) 20 MG tablet Take 2 tablets (40 mg total) by mouth daily with breakfast. 06/02/16   Erin FullingKurian Kasa, MD      VITAL SIGNS:  Blood pressure (!) 125/59, pulse 99, temperature 97.6 F (36.4 C), temperature source Oral, resp. rate 20, height 5\' 6"  (1.676 m), weight 82.6 kg (182 lb), SpO2 100 %.  PHYSICAL EXAMINATION:  GENERAL:  83 y.o.-year-old patient lying in the bed with no acute distress.  EYES: Pupils equal, round, reactive to light and accommodation. No scleral icterus. Extraocular muscles intact.  HEENT: Head atraumatic, normocephalic. Oropharynx and nasopharynx clear.  NECK:  Supple, no jugular venous distention. No thyroid enlargement, no tenderness.  LUNGS: Decreased breath sounds bilaterally, positive wheezing throughout entire  lung field. No rales,rhonchi or crepitation. No use of accessory muscles of respiration.  CARDIOVASCULAR: S1, S2 normal. No murmurs, rubs, or gallops.  ABDOMEN: Soft, nontender, nondistended. Bowel sounds present. No organomegaly or mass.  EXTREMITIES: No pedal edema, cyanosis, or clubbing.  NEUROLOGIC: Cranial nerves II through XII are intact. Muscle strength 5/5 in all extremities. Sensation intact. Gait not checked.  PSYCHIATRIC: The patient is alert and oriented x 3.  SKIN: No rash, lesion, or ulcer.   LABORATORY PANEL:   CBC  Recent Labs Lab 07/06/16 1202  WBC 12.0*  HGB 12.3  HCT 39.3  PLT 387   ------------------------------------------------------------------------------------------------------------------  Chemistries   Recent Labs Lab 07/06/16 1455  NA 139  K 3.9  CL 104  CO2 25  GLUCOSE 181*  BUN 34*  CREATININE 1.69*  CALCIUM 9.0   ------------------------------------------------------------------------------------------------------------------  RADIOLOGY:  Dg Chest 2 View  Result Date: 07/06/2016 CLINICAL DATA:  Cough, congestion and shortness of breath. EXAM: CHEST  2 VIEW COMPARISON:  12/30/2014 FINDINGS: The cardiac silhouette, mediastinal and hilar contours are normal and stable. Stable tortuosity and calcification of the thoracic aorta. Chronic bronchitic type interstitial lung changes but no infiltrates, edema or effusions. The bony thorax is intact. IMPRESSION: Chronic lung changes.  No acute overlying pulmonary process. Electronically Signed   By: Rudie Meyer M.D.   On: 07/06/2016 13:05    EKG:   Sinus tachycardia 110 bpm, left anterior fascicular block  IMPRESSION AND PLAN:   1. Acute respiratory failure with hypoxia. Pulse ox 86% with ambulation. Oxygen supplementation ordered.  2. COPD and asthma exacerbation. Start Solu-Medrol 60 mg IV daily. Already received 125 mg in the ER. Add budesonide and DuoNeb nebulizer solution. 3. Acute  kidney injury on chronic kidney disease stage III. Gentle IV fluid overnight 4. Weakness get physical therapy evaluation 5. Hypothyroidism unspecified continue levothyroxine 6. Type 2 diabetes controlled with diet. Check a hemoglobin A1c. Sliding scale while on steroids 7. Hyperlipidemia unspecified on Zocor 8. GERD on Protonix 9. Gout. Allopurinol already renally dosed  All the records are reviewed and case discussed with ED provider. Management plans discussed with the patient, family and they are in agreement.  CODE STATUS: Full code  TOTAL TIME TAKING CARE OF THIS PATIENT: 50 minutes.    Alford Highland M.D on 07/06/2016 at 3:46 PM  Between 7am to 6pm - Pager - [phone]  After 6pm call admission pager [phone]  [facility] Office  [phone]  CC: Primary care physician; Dale [place], MD

## 2016-07-07 LAB — BASIC METABOLIC PANEL
Anion gap: 9 (ref 5–15)
BUN: 28 mg/dL — AB (ref 6–20)
CHLORIDE: 107 mmol/L (ref 101–111)
CO2: 23 mmol/L (ref 22–32)
CREATININE: 1.39 mg/dL — AB (ref 0.44–1.00)
Calcium: 9.2 mg/dL (ref 8.9–10.3)
GFR calc Af Amer: 38 mL/min — ABNORMAL LOW (ref >60)
GFR calc non Af Amer: 33 mL/min — ABNORMAL LOW (ref >60)
Glucose, Bld: 195 mg/dL — ABNORMAL HIGH (ref 65–99)
Potassium: 4.6 mmol/L (ref 3.5–5.1)
Sodium: 139 mmol/L (ref 135–145)

## 2016-07-07 LAB — GLUCOSE, CAPILLARY
GLUCOSE-CAPILLARY: 233 mg/dL — AB (ref 65–99)
Glucose-Capillary: 174 mg/dL — ABNORMAL HIGH (ref 65–99)
Glucose-Capillary: 180 mg/dL — ABNORMAL HIGH (ref 65–99)
Glucose-Capillary: 151 mg/dL — ABNORMAL HIGH (ref 65–99)

## 2016-07-07 LAB — CBC
HCT: 33.8 % — ABNORMAL LOW (ref 35.0–47.0)
Hemoglobin: 10.8 g/dL — ABNORMAL LOW (ref 12.0–16.0)
MCH: 28.7 pg (ref 26.0–34.0)
MCHC: 32.1 g/dL (ref 32.0–36.0)
MCV: 89.4 fL (ref 80.0–100.0)
Platelets: 295 10*3/uL (ref 150–440)
RBC: 3.78 MIL/uL — ABNORMAL LOW (ref 3.80–5.20)
RDW: 15.5 % — ABNORMAL HIGH (ref 11.5–14.5)
WBC: 8.1 10*3/uL (ref 3.6–11.0)

## 2016-07-07 MED ORDER — AMLODIPINE BESYLATE 5 MG PO TABS
5.0000 mg | ORAL_TABLET | Freq: Every day | ORAL | Status: DC
  Administered 2016-07-07 – 2016-07-09 (×3): 5 mg via ORAL
  Filled 2016-07-07 (×3): qty 1

## 2016-07-07 MED ORDER — ENSURE ENLIVE PO LIQD
237.0000 mL | Freq: Two times a day (BID) | ORAL | Status: DC
  Administered 2016-07-07 – 2016-07-09 (×5): 237 mL via ORAL

## 2016-07-07 MED ORDER — IPRATROPIUM-ALBUTEROL 0.5-2.5 (3) MG/3ML IN SOLN
3.0000 mL | Freq: Three times a day (TID) | RESPIRATORY_TRACT | Status: DC
  Administered 2016-07-08 (×2): 3 mL via RESPIRATORY_TRACT
  Filled 2016-07-07 (×4): qty 3

## 2016-07-07 MED ORDER — BENZONATATE 100 MG PO CAPS
100.0000 mg | ORAL_CAPSULE | Freq: Three times a day (TID) | ORAL | Status: DC | PRN
  Administered 2016-07-09: 100 mg via ORAL
  Filled 2016-07-07 (×2): qty 1

## 2016-07-07 NOTE — Evaluation (Signed)
Physical Therapy Evaluation Patient Details Name: Diamond Newman MRN: [id number] DOB: 1927/04/28 Today's Date: 07/07/2016   History of Present Illness  Pt admitted for ARF with hypoxia. PMH includes asthma, GERD, gout, and HTN. Pt complains of cough and SOB symptoms with some dizziness.   Clinical Impression  Pt is a pleasant 83 year old female who was admitted for ARF with hypoxia. Pt performs transfers and ambulation with cga and RW. Pt on room air with O2 sats slightly decreased with exertion, however improve quickly with rest. Safe technique used during ambulation with AD, however slight fatigue noted with increased exertion. Reports no recent falls in past year. Pt demonstrates deficits with endurance/mobility. Would benefit from skilled PT to address above deficits and promote optimal return to PLOF. Recommend transition to HHPT upon discharge from acute hospitalization.       Follow Up Recommendations Home health PT    Equipment Recommendations       Recommendations for Other Services       Precautions / Restrictions Precautions Precautions: Fall Restrictions Weight Bearing Restrictions: No      Mobility  Bed Mobility               General bed mobility comments: not performed as pt received on BSC  Transfers Overall transfer level: Needs assistance Equipment used: Rolling walker (2 wheeled) Transfers: Sit to/from Stand Sit to Stand: Min guard         General transfer comment: safe technique performed with RW. Upright posture noted.  Ambulation/Gait Ambulation/Gait assistance: Min guard Ambulation Distance (Feet): 200 Feet Assistive device: Rolling walker (2 wheeled) Gait Pattern/deviations: Step-through pattern     General Gait Details: Pt ambulated around RN station with reciprocal gait and able to carry conversation during ambulation. Pt on room air with sats decreasing to 88%. Once seated, sats increase to 92%. Slight fatigue noted with  ambulation  Stairs            Wheelchair Mobility    Modified Rankin (Stroke Patients Only)       Balance Overall balance assessment: Needs assistance Sitting-balance support: Feet supported Sitting balance-Leahy Scale: Good     Standing balance support: Bilateral upper extremity supported Standing balance-Leahy Scale: Good                               Pertinent Vitals/Pain Pain Assessment: No/denies pain    Home Living Family/patient expects to be discharged to:: Private residence Living Arrangements: Alone Available Help at Discharge: Personal care attendant (9-3pm daily for assist with ADLs) Type of Home: House Home Access: Stairs to enter Entrance Stairs-Rails: Can reach both Entrance Stairs-Number of Steps: 4 Home Layout: One level (has 2 steps with rails) Home Equipment: Walker - 2 wheels      Prior Function Level of Independence: Independent with assistive device(s)         Comments: uses RW for all mobility     Hand Dominance        Extremity/Trunk Assessment   Upper Extremity Assessment Upper Extremity Assessment: Overall WFL for tasks assessed    Lower Extremity Assessment Lower Extremity Assessment: Generalized weakness (B LE grossly 4/5)       Communication   Communication: No difficulties  Cognition Arousal/Alertness: Awake/alert Behavior During Therapy: WFL for tasks assessed/performed Overall Cognitive Status: Within Functional Limits for tasks assessed  General Comments      Exercises Other Exercises Other Exercises: Pt received on BSC. Assisted with hygiene as pt slightly impulsive. Pt able to pull underwear up with supervision. Safe technique performed   Assessment/Plan    PT Assessment Patient needs continued PT services  PT Problem List Decreased strength;Decreased balance;Decreased mobility;Decreased safety awareness          PT Treatment Interventions Gait  training;DME instruction;Therapeutic exercise    PT Goals (Current goals can be found in the Care Plan section)  Acute Rehab PT Goals Patient Stated Goal: to go home PT Goal Formulation: With patient Time For Goal Achievement: 07/21/16 Potential to Achieve Goals: Good    Frequency Min 2X/week   Barriers to discharge        Co-evaluation               End of Session Equipment Utilized During Treatment: Gait belt Activity Tolerance: Patient tolerated treatment well Patient left: in chair;with chair alarm set Nurse Communication: Mobility status         Time: [id number] PT Time Calculation (min) (ACUTE ONLY): 18 min   Charges:   PT Evaluation $PT Eval Moderate Complexity: 1 Procedure PT Treatments $Therapeutic Activity: 8-22 mins   PT G Codes:        Antwaun Buth 07/07/2016, 2:14 PM  Elizabeth PalauStephanie Nathanyel Defenbaugh, PT, DPT [phone]

## 2016-07-07 NOTE — Progress Notes (Signed)
[facility] - [place] at [facility]   PATIENT NAME: Diamond Newman    MR#:  [id number]  DATE OF BIRTH:  [birth year]  SUBJECTIVE:  CHIEF COMPLAINT:   Chief Complaint  Patient presents with  . Cough  . Shortness of Breath   -Was extremely weak. Breathing is improving. -Not on home oxygen. Currently requiring 2 L, weaning off O2 today  REVIEW OF SYSTEMS:  Review of Systems  Constitutional: Positive for malaise/fatigue. Negative for chills and fever.  HENT: Negative for congestion, ear discharge, hearing loss and nosebleeds.   Eyes: Negative for blurred vision and double vision.  Respiratory: Positive for shortness of breath. Negative for cough and wheezing.   Cardiovascular: Negative for chest pain, palpitations and leg swelling.  Gastrointestinal: Negative for abdominal pain, constipation, diarrhea, nausea and vomiting.  Genitourinary: Negative for dysuria.  Musculoskeletal: Positive for back pain. Negative for myalgias.  Neurological: Negative for dizziness, speech change, focal weakness, seizures and headaches.  Psychiatric/Behavioral: Negative for depression.    DRUG ALLERGIES:   Allergies  Allergen Reactions  . Ace Inhibitors Cough  . Amoxicillin   . Codeine   . Sulfa Antibiotics Other (See Comments)    GI upset  . Tetracyclines & Related   . Vibramycin [Doxycycline Calcium]   . Ciprofloxacin Hcl Rash    VITALS:  Blood pressure 132/67, pulse 92, temperature 97.6 F (36.4 C), temperature source Oral, resp. rate 12, height 5\' 6"  (1.676 m), weight 82.6 kg (182 lb), SpO2 90 %.  PHYSICAL EXAMINATION:  Physical Exam  GENERAL:  83 y.o.-year-old patient lying in the bed with no acute distress.  EYES: Pupils equal, round, reactive to light and accommodation. No scleral icterus. Extraocular muscles intact.  HEENT: Head atraumatic, normocephalic. Oropharynx and nasopharynx clear.  NECK:  Supple, no jugular venous distention. No thyroid enlargement, no  tenderness.  LUNGS: Normal breath sounds bilaterally, no wheezing, rales or crepitation. No use of accessory muscles of respiration. Fine bibasilar rhonchi CARDIOVASCULAR: S1, S2 normal. No rubs, or gallops. 2/6 systolic murmur present. ABDOMEN: Soft, nontender, nondistended. Bowel sounds present. No organomegaly or mass.  EXTREMITIES: No pedal edema, cyanosis, or clubbing.  NEUROLOGIC: Cranial nerves II through XII are intact. Muscle strength 5/5 in all extremities. Sensation intact. Gait not checked.  PSYCHIATRIC: The patient is alert and oriented x 3.  SKIN: No obvious rash, lesion, or ulcer.    LABORATORY PANEL:   CBC  Recent Labs Lab 07/07/16 0413  WBC 8.1  HGB 10.8*  HCT 33.8*  PLT 295   ------------------------------------------------------------------------------------------------------------------  Chemistries   Recent Labs Lab 07/07/16 0413  NA 139  K 4.6  CL 107  CO2 23  GLUCOSE 195*  BUN 28*  CREATININE 1.39*  CALCIUM 9.2   ------------------------------------------------------------------------------------------------------------------  Cardiac Enzymes No results for input(s): TROPONINI in the last 168 hours. ------------------------------------------------------------------------------------------------------------------  RADIOLOGY:  Dg Chest 2 View  Result Date: 07/06/2016 CLINICAL DATA:  Cough, congestion and shortness of breath. EXAM: CHEST  2 VIEW COMPARISON:  12/30/2014 FINDINGS: The cardiac silhouette, mediastinal and hilar contours are normal and stable. Stable tortuosity and calcification of the thoracic aorta. Chronic bronchitic type interstitial lung changes but no infiltrates, edema or effusions. The bony thorax is intact. IMPRESSION: Chronic lung changes.  No acute overlying pulmonary process. Electronically Signed   By: Rudie MeyerP.  Gallerani M.D.   On: 07/06/2016 13:05    EKG:   Orders placed or performed during the hospital encounter of 07/06/16   . ED EKG  . ED  EKG  . EKG 12-Lead  . EKG 12-Lead    ASSESSMENT AND PLAN:   83 year old female with past medical history significant for COPD, asthma not on any home oxygen, diabetes mellitus, hypertension, GERD, history of previous UTIs presents to hospital for almost 4 week history of shortness of breath and weakness. -Noted to have COPD exacerbation and hypoxia.  #1 acute on chronic respiratory failure-secondary to COPD exacerbation with acute bronchitis. -Cough is improving. Breathing is better. -Weaned off oxygen this morning. -Continue steroids, decreased the dose -Continue nebulizer treatments and inhalers. -Continue Singulair -On azithromycin for bronchitis  #2 hypertension-continue Norvasc  #3 hypothyroidism-Synthroid  #4 neurogenic bladder-continue oxybutynin  #5 DVT prophylaxis-on Lovenox  #6 CK D stage III-monitor closely. Seems to be at baseline   Physical therapy consulted.   All the records are reviewed and case discussed with Care Management/Social Workerr. Management plans discussed with the patient, family and they are in agreement.  CODE STATUS: Full code  TOTAL TIME TAKING CARE OF THIS PATIENT: 38 minutes.   POSSIBLE D/C tomorrow, DEPENDING ON CLINICAL CONDITION.   Enid Baas M.D on 07/07/2016 at 12:28 PM  Between 7am to 6pm - Pager - [phone]  After 6pm go to [email or web address] - Social research officer, government  [facility] [place] [facility]  Office  [phone]  CC: Primary care physician; Dale [place], MD

## 2016-07-07 NOTE — Progress Notes (Signed)
Initial Nutrition Assessment  DOCUMENTATION CODES:   Not applicable  INTERVENTION:  1. Ensure Enlive po BID, each supplement provides 350 kcal and 20 grams of protein  NUTRITION DIAGNOSIS:   Increased nutrient needs related to chronic illness as evidenced by estimated needs.  GOAL:   Patient will meet greater than or equal to 90% of their needs  MONITOR:   PO intake, I & O's, Labs, Weight trends, Supplement acceptance  REASON FOR ASSESSMENT:   Malnutrition Screening Tool    ASSESSMENT:   Diamond Newman  is a 83 y.o. female with a known history of COPD and asthma presents with cough and shortness of breath. For the past month and a half she has not been feeling well. She has had cough and shortness of breath and congestion. She was on a 2 prednisone tapers. She has had poor appetite and feeling very weak and lousy  Spoke with Diamond Newman, Son at bedside. She reports good appetite PTA. Has someone come to her home and cook breakfast and lunch for her to make sure she eats. Normally has an egg, toast, bacon for breakfast. Eats light for lunch, has a meat, vegetable, starch for dinner. She denies any weight loss recently . Per chart, exhibits a 7#/3.6% insignificant wt loss over 1 month. Had a "big breakfast this morning," but no documented PO intake thus far.  Denies nausea/vomiting/diarrhea/constipation No issues chewing/swallowing. Does not complain of shortness of breath with PO intake.  Nutrition-Focused physical exam completed. Findings are mild fat depletion, mild muscle depletion, and no edema.   Labs and medications reviewed: CBG 174, 331 Vitamin D, Ca-Vit D, Solumedrol Novolog w/ meals and before bed  Diet Order:  Diet Carb Modified Fluid consistency: Thin; Room service appropriate? Yes  Skin:  Reviewed, no issues  Last BM:  07/06/2016  Height:   Ht Readings from Last 1 Encounters:  07/06/16 5\' 6"  (1.676 m)    Weight:   Wt Readings from Last 1 Encounters:   07/06/16 182 lb (82.6 kg)    Ideal Body Weight:  59.09 kg  BMI:  Body mass index is 29.38 kg/m.  Estimated Nutritional Needs:   Kcal:  1650-1800 calories  Protein:  83-99 gm  Fluid:  >/= 1.6L  EDUCATION NEEDS:   No education needs identified at this time  Dionne AnoWilliam M. Gionni Vaca, MS, RD LDN Inpatient Clinical Dietitian Pager [phone]

## 2016-07-08 LAB — BASIC METABOLIC PANEL
Anion gap: 9 (ref 5–15)
BUN: 33 mg/dL — AB (ref 6–20)
CHLORIDE: 104 mmol/L (ref 101–111)
CO2: 28 mmol/L (ref 22–32)
CREATININE: 1.17 mg/dL — AB (ref 0.44–1.00)
Calcium: 9.6 mg/dL (ref 8.9–10.3)
GFR calc Af Amer: 47 mL/min — ABNORMAL LOW (ref >60)
GFR calc non Af Amer: 41 mL/min — ABNORMAL LOW (ref >60)
Glucose, Bld: 145 mg/dL — ABNORMAL HIGH (ref 65–99)
POTASSIUM: 4.6 mmol/L (ref 3.5–5.1)
SODIUM: 141 mmol/L (ref 135–145)

## 2016-07-08 LAB — GLUCOSE, CAPILLARY
Glucose-Capillary: 130 mg/dL — ABNORMAL HIGH (ref 65–99)
Glucose-Capillary: 209 mg/dL — ABNORMAL HIGH (ref 65–99)
Glucose-Capillary: 357 mg/dL — ABNORMAL HIGH (ref 65–99)
Glucose-Capillary: 193 mg/dL — ABNORMAL HIGH (ref 65–99)

## 2016-07-08 MED ORDER — ALBUTEROL SULFATE (2.5 MG/3ML) 0.083% IN NEBU
2.5000 mg | INHALATION_SOLUTION | RESPIRATORY_TRACT | Status: DC | PRN
  Administered 2016-07-08: 2.5 mg via RESPIRATORY_TRACT
  Filled 2016-07-08: qty 3

## 2016-07-08 MED ORDER — ENOXAPARIN SODIUM 40 MG/0.4ML ~~LOC~~ SOLN
40.0000 mg | SUBCUTANEOUS | Status: DC
  Administered 2016-07-08: 40 mg via SUBCUTANEOUS
  Filled 2016-07-08: qty 0.4

## 2016-07-08 MED ORDER — METHYLPREDNISOLONE SODIUM SUCC 40 MG IJ SOLR
40.0000 mg | Freq: Two times a day (BID) | INTRAMUSCULAR | Status: DC
  Administered 2016-07-08 – 2016-07-09 (×2): 40 mg via INTRAVENOUS
  Filled 2016-07-08 (×2): qty 1

## 2016-07-08 NOTE — Progress Notes (Signed)
[facility] - [place] at [facility]   PATIENT NAME: Diamond Newman    MR#:  [id number]  DATE OF BIRTH:  07-Mar-1927  SUBJECTIVE:  CHIEF COMPLAINT:   Chief Complaint  Patient presents with  . Cough  . Shortness of Breath   -Was extremely weak. Breathing is improving but wheezing  -Not on home oxygen. Currently requiring 2 L, weaning off O2 today  REVIEW OF SYSTEMS:  Review of Systems  Constitutional: Positive for malaise/fatigue. Negative for chills and fever.  HENT: Negative for congestion, ear discharge, hearing loss and nosebleeds.   Eyes: Negative for blurred vision and double vision.  Respiratory: Positive for shortness of breath. Negative for cough and wheezing.   Cardiovascular: Negative for chest pain, palpitations and leg swelling.  Gastrointestinal: Negative for abdominal pain, constipation, diarrhea, nausea and vomiting.  Genitourinary: Negative for dysuria.  Musculoskeletal: Positive for back pain. Negative for myalgias.  Neurological: Negative for dizziness, speech change, focal weakness, seizures and headaches.  Psychiatric/Behavioral: Negative for depression.    DRUG ALLERGIES:   Allergies  Allergen Reactions  . Ace Inhibitors Cough  . Amoxicillin   . Codeine   . Sulfa Antibiotics Other (See Comments)    GI upset  . Tetracyclines & Related   . Vibramycin [Doxycycline Calcium]   . Ciprofloxacin Hcl Rash    VITALS:  Blood pressure (!) 158/82, pulse (!) 104, temperature 98.5 F (36.9 C), temperature source Oral, resp. rate 12, height 5\' 6"  (1.676 m), weight 82.6 kg (182 lb), SpO2 93 %.  PHYSICAL EXAMINATION:  Physical Exam  GENERAL:  [age] y.o.-year-old patient lying in the bed with no acute distress.  EYES: Pupils equal, round, reactive to light and accommodation. No scleral icterus. Extraocular muscles intact.  HEENT: Head atraumatic, normocephalic. Oropharynx and nasopharynx clear.  NECK:  Supple, no jugular venous distention. No  thyroid enlargement, no tenderness.  LUNGS: Mod  breath sounds bilaterally, min end expiratory wheezing, no  rales or crepitation. No use of accessory muscles of respiration. Fine bibasilar rhonchi CARDIOVASCULAR: S1, S2 normal. No rubs, or gallops. 2/6 systolic murmur present. ABDOMEN: Soft, nontender, nondistended. Bowel sounds present. No organomegaly or mass.  EXTREMITIES: No pedal edema, cyanosis, or clubbing.  NEUROLOGIC: Cranial nerves II through XII are intact. Muscle strength 5/5 in all extremities. Sensation intact. Gait not checked.  PSYCHIATRIC: The patient is alert and oriented x 3.  SKIN: No obvious rash, lesion, or ulcer.    LABORATORY PANEL:   CBC  Recent Labs Lab 07/07/16 0413  WBC 8.1  HGB 10.8*  HCT 33.8*  PLT 295   ------------------------------------------------------------------------------------------------------------------  Chemistries   Recent Labs Lab 07/08/16 0531  NA 141  K 4.6  CL 104  CO2 28  GLUCOSE 145*  BUN 33*  CREATININE 1.17*  CALCIUM 9.6   ------------------------------------------------------------------------------------------------------------------  Cardiac Enzymes No results for input(s): TROPONINI in the last 168 hours. ------------------------------------------------------------------------------------------------------------------  RADIOLOGY:  No results found.  EKG:   Orders placed or performed during the hospital encounter of 07/06/16  . ED EKG  . ED EKG  . EKG 12-Lead  . EKG 12-Lead    ASSESSMENT AND PLAN:   83 year old female with past medical history significant for COPD, asthma not on any home oxygen, diabetes mellitus, hypertension, GERD, history of previous UTIs presents to hospital for almost 4 week history of shortness of breath and weakness. -Noted to have COPD exacerbation and hypoxia.  #1 acute on chronic respiratory failure-secondary to COPD exacerbation with acute bronchitis. -Cough is  improving. Breathing is betterBut still wheezing -Weaned off oxygen to room air  -Continue Solu-Medrol for 40 g IV every 12 hours, decreased the dose -Continue nebulizer treatments and inhalers. -Continue Singulair -On azithromycin for bronchitis  #2 hypertension-continue Norvasc  #3 hypothyroidism-Synthroid  #4 neurogenic bladder-continue oxybutynin  #5 DVT prophylaxis-on Lovenox  #6 CK D stage III-monitor closely. Seems to be at baseline   Physical therapy - HH PT   All the records are reviewed and case discussed with Care Management/Social Workerr. Management plans discussed with the patient, family and they are in agreement.  CODE STATUS: Full code  TOTAL TIME TAKING CARE OF THIS PATIENT: 35  minutes.   POSSIBLE D/C tomorrow, DEPENDING ON CLINICAL CONDITION.   Ramonita Lab M.D on 07/08/2016 at 3:19 PM  Between 7am to 6pm - Pager - [phone]  After 6pm go to [email or web address] - Social research officer, government  [facility]  Office  [phone]  CC: Primary care physician; Dale Rogers, MD

## 2016-07-08 NOTE — Progress Notes (Signed)
Anticoagulation monitoring(Lovenox):  83yo  female ordered Lovenox 30 mg Q24h for DVT preventiion.  Filed Weights   07/06/16 1156  Weight: 182 lb (82.6 kg)   BMI     Lab Results  Component Value Date   CREATININE 1.17 (H) 07/08/2016   CREATININE 1.39 (H) 07/07/2016   CREATININE 1.69 (H) 07/06/2016   Estimated Creatinine Clearance: 36.7 mL/min (by C-G formula based on SCr of 1.17 mg/dL (H)). Hemoglobin & Hematocrit     Component Value Date/Time   HGB 10.8 (L) 07/07/2016 0413   HGB 10.5 (L) 11/11/2013 0414   HCT 33.8 (L) 07/07/2016 0413   HCT 32.9 (L) 11/11/2013 0414     Per Protocol for Patient with estCrcl > 30 ml/min and BMI < 40, will transition to Lovenox 40 mg Q24h     Clovia CuffLisa Amarissa Koerner, PharmD, BCPS 07/08/2016 12:49 PM

## 2016-07-08 NOTE — Care Management Note (Signed)
Case Management Note  Patient Details  Name: Diamond Newman MRN: [id number] Date of Birth: [birth year]  Subjective/Objective:  Met with patient at bedside to discuss discharge planning. Patient is a pleasant 83 yo that was independent and active prior to admission. She was going to a group therapy program but due to the cold weather she slowed down. She lives alone and drives. Uses a walker for ambulation. Discussed home health nursing  and PT recommendations. She is agreeable but would like to speak with her daughter first regarding [facility] agencies. RNCM to follow up with  patient regarding choices.  She is not on home O2. PCP is Dr Einar Pheasant.                 Action/Plan:   Expected Discharge Date:                  Expected Discharge Plan:  [place]  In-House Referral:     Discharge planning Services  CM Consult  Post Acute Care Choice:  Home Health Choice offered to:  Patient  DME Arranged:    DME Agency:     HH Arranged:  RN, PT [place] Agency:     Status of Service:  In process, will continue to follow  If discussed at Long Length of Stay Meetings, dates discussed:    Additional Comments:  Jolly Mango, RN 07/08/2016, 2:22 PM

## 2016-07-09 LAB — GLUCOSE, CAPILLARY
GLUCOSE-CAPILLARY: 185 mg/dL — AB (ref 65–99)
Glucose-Capillary: 189 mg/dL — ABNORMAL HIGH (ref 65–99)

## 2016-07-09 MED ORDER — LABETALOL HCL 5 MG/ML IV SOLN
10.0000 mg | INTRAVENOUS | Status: DC | PRN
  Filled 2016-07-09: qty 4

## 2016-07-09 MED ORDER — BENZONATATE 100 MG PO CAPS: 100.0000 mg | ORAL_CAPSULE | Freq: Three times a day (TID) | ORAL | 0 refills | Status: DC | PRN

## 2016-07-09 MED ORDER — LABETALOL HCL 5 MG/ML IV SOLN
10.0000 mg | Freq: Once | INTRAVENOUS | Status: AC
  Administered 2016-07-09: 10 mg via INTRAVENOUS
  Filled 2016-07-09: qty 4

## 2016-07-09 MED ORDER — AZITHROMYCIN 250 MG PO TABS: ORAL_TABLET | ORAL | 0 refills | Status: DC

## 2016-07-09 MED ORDER — PREDNISONE 10 MG (21) PO TBPK: 10.0000 mg | ORAL_TABLET | Freq: Every day | ORAL | 0 refills | Status: DC

## 2016-07-09 MED ORDER — ENSURE ENLIVE PO LIQD: 237.0000 mL | Freq: Two times a day (BID) | ORAL | 12 refills | Status: DC

## 2016-07-09 MED ORDER — HYDRALAZINE HCL 20 MG/ML IJ SOLN: 10.0000 mg | Freq: Once | INTRAMUSCULAR | Status: DC

## 2016-07-09 MED ORDER — AMLODIPINE BESYLATE 5 MG PO TABS: 5.0000 mg | ORAL_TABLET | Freq: Every day | ORAL | 0 refills | Status: DC

## 2016-07-09 NOTE — Progress Notes (Signed)
DISCHARGE NOTE:  Pt given discharge instructions and prescriptions, pts son and daughter also at the bedside. Pt verbalized understanding. Pt wheeled to car.

## 2016-07-09 NOTE — Discharge Instructions (Signed)
Follow-up with primary care physician in a week or sooner as needed

## 2016-07-09 NOTE — Care Management Note (Signed)
Case Management Note  Patient Details  Name: Diamond Newman MRN: [id number] Date of Birth: December 30, 1926  Subjective/Objective:  Spoke with daughter, Raynelle FanningMona. Discussed home health services and offered choice of home health agencies.  Daughter chose [facility]. Jason with [facility] notified. No DME needed. patient has a walker. PCP is Dale Durhamharlene Scott                Action/Plan: Referral to [facility] for nursing and PT.   Expected Discharge Date:                  Expected Discharge Plan:  Home w Home Health Services  In-House Referral:     Discharge planning Services  CM Consult  Post Acute Care Choice:  Home Health Choice offered to:  Patient  DME Arranged:    DME Agency:     HH Arranged:  RN, PT HH Agency:     Status of Service:  In process, will continue to follow  If discussed at Long Length of Stay Meetings, dates discussed:    Additional Comments:  Marily MemosLisa M Guiselle Mian, RN 07/09/2016, 9:08 AM

## 2016-07-09 NOTE — Progress Notes (Signed)
Blood pressure 140/116. Received order for hydralazine 10 mg IV once from Dr Tobi BastosPyreddy

## 2016-07-09 NOTE — Progress Notes (Signed)
Pt blood pressure 140/116. Hospitalist paged

## 2016-07-09 NOTE — Care Management Important Message (Signed)
Important Message  Patient Details  Name: Alferd ApaMarie K Stitt MRN: [id number] Date of Birth: 1927/01/01   Medicare Important Message Given:  Yes    Marily MemosLisa M Amiere Cawley, RN 07/09/2016, 12:32 PM

## 2016-07-09 NOTE — Progress Notes (Signed)
Physical Therapy Treatment Patient Details Name: Diamond Newman MRN: [id number] DOB: 07-04-1926 Today's Date: 07/09/2016    History of Present Illness Pt admitted for ARF with hypoxia. PMH includes asthma, GERD, gout, and HTN. Pt complains of cough and SOB symptoms with some dizziness.     PT Comments    Pt is making good progress towards goals with increased mobility noted this session. Pt able to safely navigate stairs with safe technique as well as ambulate using RW. No LOB noted and decreased fatigue noted with exertion. Pt continues to be motivated to perform therapy. Entire session performed on room air with slight decreased O2 sats, however quickly improve with seated rest break.  Follow Up Recommendations  Home health PT     Equipment Recommendations       Recommendations for Other Services       Precautions / Restrictions Precautions Precautions: Fall Restrictions Weight Bearing Restrictions: No    Mobility  Bed Mobility Overal bed mobility: Modified Independent             General bed mobility comments: safe technique performed with ease of movement  Transfers Overall transfer level: Modified independent Equipment used: Rolling walker (2 wheeled) Transfers: Sit to/from Stand Sit to Stand: Modified independent (Device/Increase time)         General transfer comment: safe technique performed with upright posture  Ambulation/Gait Ambulation/Gait assistance: Min guard Ambulation Distance (Feet): 250 Feet Assistive device: Rolling walker (2 wheeled) Gait Pattern/deviations: Step-through pattern     General Gait Details: Pt ambulated around RN station and demonstrates reciprocal gait pattern. Safe technique performed. O2 sats decrease to 88% with exertion, however improve to 95% quickly with seated rest break. No fatigue noted with exertion   Stairs Stairs: Yes   Stair Management: Two rails;Step to pattern Number of Stairs: 4 General stair comments:  Pt navigated up/down stairs with safe technique using step to gait pattern.   Wheelchair Mobility    Modified Rankin (Stroke Patients Only)       Balance                                    Cognition Arousal/Alertness: Awake/alert Behavior During Therapy: WFL for tasks assessed/performed Overall Cognitive Status: Within Functional Limits for tasks assessed                      Exercises      General Comments        Pertinent Vitals/Pain Pain Assessment: No/denies pain    Home Living                      Prior Function            PT Goals (current goals can now be found in the care plan section) Acute Rehab PT Goals Patient Stated Goal: to go home PT Goal Formulation: With patient Time For Goal Achievement: 07/21/16 Potential to Achieve Goals: Good Progress towards PT goals: Progressing toward goals    Frequency    Min 2X/week      PT Plan Current plan remains appropriate    Co-evaluation             End of Session Equipment Utilized During Treatment: Gait belt Activity Tolerance: Patient tolerated treatment well Patient left: in bed;with bed alarm set;with family/visitor present     Time: 7829-5621 PT Time Calculation (min) (ACUTE  ONLY): 16 min  Charges:  $Gait Training: 8-22 mins                    G Codes:      Cael Worth 07/09/2016, 11:46 AM  Elizabeth PalauStephanie Khila Papp, PT, DPT [phone]

## 2016-07-09 NOTE — Discharge Summary (Signed)
[facility] - [place] at [facility]   PATIENT NAME: Diamond Newman    MR#:  [id number]  DATE OF BIRTH:  05/13/1927  DATE OF ADMISSION:  07/06/2016 ADMITTING PHYSICIAN: Alford Highland, MD  DATE OF DISCHARGE: No discharge date for patient encounter.  PRIMARY CARE PHYSICIAN: Diamond Lewisburg, MD    ADMISSION DIAGNOSIS:  COPD exacerbation (HCC) [J44.1] Acute respiratory failure with hypoxia (HCC) [J96.01]  DISCHARGE DIAGNOSIS:  Active Problems:   Acute respiratory failure with hypoxia (HCC)   SECONDARY DIAGNOSIS:   Past Medical History:  Diagnosis Date  . Asthma   . Chronic bronchitis (HCC)   . Diabetes mellitus (HCC)   . GERD (gastroesophageal reflux disease)    hiatal hernia  . Gout   . Hematuria    followed by urology  . History of colon polyps   . History of frequent urinary tract infections   . Humeral fracture 9/09   comminuted impacted proximal  . Hypercholesterolemia   . Hypertension   . Peripheral vascular disease [facility])     HOSPITAL COURSE:   Diamond Newman  is a 83 y.o. female with a known history of COPD and asthma presents with cough and shortness of breath. For the past month and a half she has not been feeling well. She has had cough and shortness of breath and congestion. She was on a 2 prednisone tapers. She has had poor appetite and feeling very weak and lousy. She's been sleeping a lot. She is unsteady on her feet using her walker. She is dizzy when she gets up. With coughing she does feel some tightness around both sides of her upper abdomen. She saw the nurse practitioner today and sent her to the ER. In the ER when they tried to stand her up to walk around she became hypoxic with a pulse ox of 86% and hospitalist services were contacted for evaluation.  1 acute on chronic respiratory failure-secondary to COPD exacerbation with acute bronchitis. -Cough is improving. Breathing is betterBut still wheezing -Weaned off oxygen to room air   -Continue Solu-Medrol for 40 g IV every 12 hours, decreased the dose,Tapering to by mouth prednisone -Continue nebulizer treatments and inhalers. -Continue Singulair -On azithromycin for bronchitis  #2 hypertension-continue Norvasc  #3 hypothyroidism-Synthroid  #4 neurogenic bladder-continue oxybutynin  #5 DVT prophylaxis-on Lovenox  #6 CK D stage III-monitor closely. Seems to be at baseline   Physical therapy - HH PT    DISCHARGE CONDITIONS:   Fair  CONSULTS OBTAINED:     PROCEDURES None  DRUG ALLERGIES:   Allergies  Allergen Reactions  . Ace Inhibitors Cough  . Amoxicillin   . Codeine   . Sulfa Antibiotics Other (See Comments)    GI upset  . Tetracyclines & Related   . Vibramycin [Doxycycline Calcium]   . Ciprofloxacin Hcl Rash    DISCHARGE MEDICATIONS:   Current Discharge Medication List    START taking these medications   Details  amLODipine (NORVASC) 5 MG tablet Take 1 tablet (5 mg total) by mouth daily. Qty: 30 tablet, Refills: 0    azithromycin (ZITHROMAX) 250 MG tablet Take 1 tablet once daily for 4 more days Qty: 4 each, Refills: 0    benzonatate (TESSALON) 100 MG capsule Take 1 capsule (100 mg total) by mouth 3 (three) times daily as needed for cough. Qty: 20 capsule, Refills: 0    feeding supplement, ENSURE ENLIVE, (ENSURE ENLIVE) LIQD Take 237 mLs by mouth 2 (two) times daily between meals. Qty:  237 mL, Refills: 12    predniSONE (STERAPRED UNI-PAK 21 TAB) 10 MG (21) TBPK tablet Take 1 tablet (10 mg total) by mouth daily. Take 6 tablets by mouth for 1 day followed by  5 tablets by mouth for 1 day followed by  4 tablets by mouth for 1 day followed by  3 tablets by mouth for 1 day followed by  2 tablets by mouth for 1 day followed by  1 tablet by mouth for a day and stop Qty: 21 tablet, Refills: 0      CONTINUE these medications which have NOT CHANGED   Details  albuterol (PROVENTIL HFA;VENTOLIN HFA) 108 (90 BASE) MCG/ACT  inhaler Inhale 2 puffs into the lungs every 6 (six) hours as needed for wheezing or shortness of breath. Qty: 1 Inhaler, Refills: 1    allopurinol (ZYLOPRIM) 100 MG tablet Take 1 tablet (100 mg total) by mouth daily. Qty: 30 tablet, Refills: 11    Calcium Carbonate-Vitamin D (CALCIUM 600+D) 600-400 MG-UNIT per tablet Take 1 tablet by mouth daily.     cetirizine (ZYRTEC) 10 MG tablet Take 10 mg by mouth daily.    Cholecalciferol (VITAMIN D-3) 1000 UNITS CAPS Take 1 capsule by mouth daily.    Cranberry-Cholecalciferol 4200-500 MG-UNIT CAPS Take by mouth.    FLUoxetine (PROZAC) 20 MG capsule Take 1 capsule (20 mg total) by mouth daily. Qty: 30 capsule, Refills: 1    Fluticasone-Salmeterol (ADVAIR DISKUS) 250-50 MCG/DOSE AEPB Inhale 1 puff into the lungs 2 (two) times daily. Qty: 60 each, Refills: 3    levothyroxine (SYNTHROID, LEVOTHROID) 50 MCG tablet Take 1 tablet (50 mcg total) by mouth daily. Qty: 30 tablet, Refills: 11    methenamine (HIPREX) 1 g tablet Take 1 tablet (1 g total) by mouth daily. Qty: 30 tablet, Refills: 0    montelukast (SINGULAIR) 10 MG tablet Take 1 tablet (10 mg total) by mouth at bedtime. Qty: 30 tablet, Refills: 5    pantoprazole (PROTONIX) 40 MG tablet Take 1 tablet (40 mg total) by mouth daily. Qty: 30 tablet, Refills: 5    simvastatin (ZOCOR) 20 MG tablet Take 1 tablet (20 mg total) by mouth daily. Qty: 30 tablet, Refills: 11    tolterodine (DETROL) 2 MG tablet Take 1 tablet (2 mg total) by mouth 2 (two) times daily. Qty: 60 tablet, Refills: 2      STOP taking these medications     predniSONE (DELTASONE) 20 MG tablet          DISCHARGE INSTRUCTIONS:   Follow-up with primary care physician in a week  DIET:  Cardiac diet  DISCHARGE CONDITION:  Stable  ACTIVITY:  Activity as tolerated  OXYGEN:  Home Oxygen: No.   Oxygen Delivery: room air  DISCHARGE LOCATION:  home   If you experience worsening of your admission symptoms,  develop shortness of breath, life threatening emergency, suicidal or homicidal thoughts you must seek medical attention immediately by calling 911 or calling your MD immediately  if symptoms less severe.  You Must read complete instructions/literature along with all the possible adverse reactions/side effects for all the Medicines you take and that have been prescribed to you. Take any new Medicines after you have completely understood and accpet all the possible adverse reactions/side effects.   Please note  You were cared for by a hospitalist during your hospital stay. If you have any questions about your discharge medications or the care you received while you were in the hospital after you are discharged,  you can call the unit and asked to speak with the hospitalist on call if the hospitalist that took care of you is not available. Once you are discharged, your primary care physician will handle any further medical issues. Please note that NO REFILLS for any discharge medications will be authorized once you are discharged, as it is imperative that you return to your primary care physician (or establish a relationship with a primary care physician if you do not have one) for your aftercare needs so that they can reassess your need for medications and monitor your lab values.     Today  Chief Complaint  Patient presents with  . Cough  . Shortness of Breath   Patient is feeling much better. Shortness of breath is better. No overnight events. Slept well. Wants to go home  ROS:  CONSTITUTIONAL: Denies fevers, chills. Denies any fatigue, weakness.  EYES: Denies blurry vision, double vision, eye pain. EARS, NOSE, THROAT: Denies tinnitus, ear pain, hearing loss. RESPIRATORY: Denies cough, wheeze, shortness of breath.  CARDIOVASCULAR: Denies chest pain, palpitations, edema.  GASTROINTESTINAL: Denies nausea, vomiting, diarrhea, abdominal pain. Denies bright red blood per rectum. GENITOURINARY:  Denies dysuria, hematuria. ENDOCRINE: Denies nocturia or thyroid problems. HEMATOLOGIC AND LYMPHATIC: Denies easy bruising or bleeding. SKIN: Denies rash or lesion. MUSCULOSKELETAL: Denies pain in neck, back, shoulder, knees, hips or arthritic symptoms.  NEUROLOGIC: Denies paralysis, paresthesias.  PSYCHIATRIC: Denies anxiety or depressive symptoms.   VITAL SIGNS:  Blood pressure (!) 176/97, pulse 86, temperature 97.5 F (36.4 C), temperature source Oral, resp. rate 16, height 5\' 6"  (1.676 m), weight 82.6 kg (182 lb), SpO2 94 %.  I/O:    Intake/Output Summary (Last 24 hours) at 07/09/16 0959 Last data filed at 07/09/16 0934  Gross per 24 hour  Intake              480 ml  Output                0 ml  Net              480 ml    PHYSICAL EXAMINATION:  GENERAL:  83 y.o.-year-old patient lying in the bed with no acute distress.  EYES: Pupils equal, round, reactive to light and accommodation. No scleral icterus. Extraocular muscles intact.  HEENT: Head atraumatic, normocephalic. Oropharynx and nasopharynx clear.  NECK:  Supple, no jugular venous distention. No thyroid enlargement, no tenderness.  LUNGS: Moderate breath sounds bilaterally, good air entry no wheezing, rales,rhonchi or crepitation. No use of accessory muscles of respiration.  CARDIOVASCULAR: S1, S2 normal. No murmurs, rubs, or gallops.  ABDOMEN: Soft, non-tender, non-distended. Bowel sounds present. No organomegaly or mass.  EXTREMITIES: No pedal edema, cyanosis, or clubbing.  NEUROLOGIC: Cranial nerves II through XII are intact. Muscle strength 5/5 in all extremities. Sensation intact. Gait not checked.  PSYCHIATRIC: The patient is alert and oriented x 3.  SKIN: No obvious rash, lesion, or ulcer.   DATA REVIEW:   CBC  Recent Labs Lab 07/07/16 0413  WBC 8.1  HGB 10.8*  HCT 33.8*  PLT 295    Chemistries   Recent Labs Lab 07/08/16 0531  NA 141  K 4.6  CL 104  CO2 28  GLUCOSE 145*  BUN 33*  CREATININE  1.17*  CALCIUM 9.6    Cardiac Enzymes No results for input(s): TROPONINI in the last 168 hours.  Microbiology Results  Results for orders placed or performed in visit on 12/26/14  CULTURE, URINE COMPREHENSIVE  Status: None   Collection Time: 12/26/14  3:41 PM  Result Value Ref Range Status   Culture ESCHERICHIA COLI  Final   Colony Count >=100,000 COLONIES/ML  Final   Organism ID, Bacteria ESCHERICHIA COLI  Final      Susceptibility   Escherichia coli -  (no method available)    AMPICILLIN >=32 Resistant     AMOX/CLAVULANIC 4 Sensitive     AMPICILLIN/SULBACTAM 8 Sensitive     PIP/TAZO <=4 Sensitive     IMIPENEM <=0.25 Sensitive     CEFTRIAXONE <=1 Sensitive     CEFTAZIDIME <=1 Sensitive     CEFEPIME <=1 Sensitive     GENTAMICIN <=1 Sensitive     TOBRAMYCIN <=1 Sensitive     CIPROFLOXACIN <=0.25 Sensitive     LEVOFLOXACIN 1 Sensitive     NITROFURANTOIN <=16 Sensitive     TRIMETH/SULFA* <=20 Sensitive      * ORAL therapy:A cefazolin MIC of <32 predicts susceptibility to the oral agents cefaclor,cefdinir,cefpodoxime,cefprozil,cefuroxime,cephalexin,and loracarbef when used for therapy of uncomplicated UTIs due to E.coli,K.pneumomiae,and P.mirabilis. PARENTERAL therapy: A cefazolinMIC of >8 indicates resistance to parenteralcefazolin. An alternate test method must beperformed to confirm susceptibility to parenteralcefazolin.    RADIOLOGY:  Dg Chest 2 View  Result Date: 07/06/2016 CLINICAL DATA:  Cough, congestion and shortness of breath. EXAM: CHEST  2 VIEW COMPARISON:  12/30/2014 FINDINGS: The cardiac silhouette, mediastinal and hilar contours are normal and stable. Stable tortuosity and calcification of the thoracic aorta. Chronic bronchitic type interstitial lung changes but no infiltrates, edema or effusions. The bony thorax is intact. IMPRESSION: Chronic lung changes.  No acute overlying pulmonary process. Electronically Signed   By: Rudie Meyer M.D.   On: 07/06/2016 13:05     EKG:   Orders placed or performed during the hospital encounter of 07/06/16  . ED EKG  . ED EKG  . EKG 12-Lead  . EKG 12-Lead      Management plans discussed with the patient, family and they are in agreement.  CODE STATUS:     Code Status Orders        Start     Ordered   07/06/16 1548  Full code  Continuous     07/06/16 1547    Code Status History    Date Active Date Inactive Code Status Order ID Comments User Context   This patient has a current code status but no historical code status.    Advance Directive Documentation   Flowsheet Row Most Recent Value  Type of Advance Directive  Healthcare Power of Attorney, Living will  Pre-existing out of facility DNR order (yellow form or pink MOST form)  No data  "MOST" Form in Place?  No data      TOTAL TIME TAKING CARE OF THIS PATIENT: 45 minutes.   Note: This dictation was prepared with Dragon dictation along with smaller phrase technology. Any transcriptional errors that result from this process are unintentional.   @M[facility] @  on 07/09/2016 at 9:59 AM  Between 7am to 6pm - Pager - [phone]  After 6pm go to [email or web address] - password [email or web address] [facility]  [facility]  Office  [phone]  CC: Primary care physician; Diamond Nicholson, MD

## 2016-07-10 ENCOUNTER — Other Ambulatory Visit: Payer: Self-pay | Admitting: Internal Medicine

## 2016-07-12 ENCOUNTER — Ambulatory Visit: Payer: Medicare Other | Admitting: Internal Medicine

## 2016-07-12 ENCOUNTER — Ambulatory Visit (INDEPENDENT_AMBULATORY_CARE_PROVIDER_SITE_OTHER): Payer: Medicare Other | Admitting: Internal Medicine

## 2016-07-12 ENCOUNTER — Encounter: Payer: Self-pay | Admitting: Internal Medicine

## 2016-07-12 ENCOUNTER — Telehealth: Payer: Self-pay | Admitting: Internal Medicine

## 2016-07-12 VITALS — BP 120/62 | HR 100 | Temp 98.6°F | Ht 66.0 in | Wt 181.6 lb

## 2016-07-12 DIAGNOSIS — E538 Deficiency of other specified B group vitamins: Secondary | ICD-10-CM | POA: Diagnosis not present

## 2016-07-12 DIAGNOSIS — E7800 Pure hypercholesterolemia, unspecified: Secondary | ICD-10-CM | POA: Diagnosis not present

## 2016-07-12 DIAGNOSIS — R531 Weakness: Secondary | ICD-10-CM | POA: Diagnosis not present

## 2016-07-12 DIAGNOSIS — E1151 Type 2 diabetes mellitus with diabetic peripheral angiopathy without gangrene: Secondary | ICD-10-CM

## 2016-07-12 DIAGNOSIS — J449 Chronic obstructive pulmonary disease, unspecified: Secondary | ICD-10-CM | POA: Diagnosis not present

## 2016-07-12 DIAGNOSIS — K219 Gastro-esophageal reflux disease without esophagitis: Secondary | ICD-10-CM

## 2016-07-12 DIAGNOSIS — R29898 Other symptoms and signs involving the musculoskeletal system: Secondary | ICD-10-CM

## 2016-07-12 DIAGNOSIS — J9601 Acute respiratory failure with hypoxia: Secondary | ICD-10-CM

## 2016-07-12 DIAGNOSIS — I10 Essential (primary) hypertension: Secondary | ICD-10-CM

## 2016-07-12 DIAGNOSIS — N183 Chronic kidney disease, stage 3 unspecified: Secondary | ICD-10-CM

## 2016-07-12 NOTE — Telephone Encounter (Signed)
Transition Care Management Follow-up Telephone Call  How have you been since you were released from the hospital? Patient stated she is feeling pretty good.   Do you understand why you were in the hospital?Yes, my blood pressure was high, I was breathing real hard.   Do you understand the discharge instrcutions? Tes.  Items Reviewed:  Medications reviewed: Yes  Allergies reviewed: Yes  Dietary changes reviewed: Yes, added Ensure.  Referrals reviewed: Yes.   Functional Questionnaire:   Activities of Daily Living (ADLs):   She states they are independent in the following: I able bathe and dress and eat meals that are prepared. States they require assistance with the following: Medications are prepared for me By sitter that sits with me. Meals are prepared by sitter.   Any transportation issues/concerns?: Yes.   Any patient concerns? No   Confirmed importance and date/time of follow-up visits scheduled: Yes  Confirmed with patient if condition begins to worsen call PCP or go to the ER.  Patient was given the Call-a-Nurse line [phone]: Yes.

## 2016-07-12 NOTE — Progress Notes (Signed)
[name], female   DOB: 1926/05/30, 83 y.o.   MRN: [id number]   Subjective:    [name], female    DOB: [birth year], 83 y.o.   MRN: [id number]  HPI  Patient here for hospital follow up.  She is accompanied by her daughter.  History obtained from both of them.  seh was admitted 07/06/16 with acute respiratory failure with hypoxia. Was found to have acute on chronic respiratory failure - secondary to COPD exacerbation with acute bronchitis.  Initially placed on oxygen.  Weaned off.  Placed on steroids and continued taper after discharge.  Treated with abx  On singulair.  She is breathing better.  Still feels weak.  No chest pain.  No acid reflux.  No abdominal pain.  Bowels moving.     Past Medical History:  Diagnosis Date  . Asthma   . Chronic bronchitis ([place])   . Diabetes mellitus ([place])   . GERD (gastroesophageal reflux disease)    hiatal hernia  . Gout   . Hematuria    followed by urology  . History of colon polyps   . History of frequent urinary tract infections   . Humeral fracture 9/09   comminuted impacted proximal  . Hypercholesterolemia   . Hypertension   . Peripheral vascular disease [facility])    Past Surgical History:  Procedure Laterality Date  . BLADDER SUSPENSION  12/08/99  . BREAST BIOPSY Right 1960's  . CHOLECYSTECTOMY  1994  . KNEE SURGERY Right    Family History  Problem Relation Age of Onset  . Hypertension Father   . CVA Father   . CAD Father   . Hypertension Mother   . Colitis Mother   . CAD Mother   . Breast cancer Neg Hx   . Colon cancer Neg Hx    Social History   Social History  . Marital status: Widowed    Spouse name: N/A  . Number of children: 2  . Years of education: N/A   Social History Main Topics  . Smoking status: Never Smoker  . Smokeless tobacco: Never Used  . Alcohol use No  . Drug use: No  . Sexual activity: No   Other Topics Concern  . None   Social History Narrative  . None    Outpatient  Encounter Prescriptions as of 07/12/2016  Medication Sig  . albuterol (PROVENTIL HFA;VENTOLIN HFA) 108 (90 BASE) MCG/ACT inhaler Inhale 2 puffs into the lungs every 6 (six) hours as needed for wheezing or shortness of breath.  . allopurinol (ZYLOPRIM) 100 MG tablet Take 1 tablet (100 mg total) by mouth daily.  Marland Kitchen amLODipine (NORVASC) 5 MG tablet Take 1 tablet (5 mg total) by mouth daily.  Marland Kitchen azithromycin (ZITHROMAX) 250 MG tablet Take 1 tablet once daily for 4 more days  . benzonatate (TESSALON) 100 MG capsule Take 1 capsule (100 mg total) by mouth 3 (three) times daily as needed for cough.  . Calcium Carbonate-Vitamin D (CALCIUM 600+D) 600-400 MG-UNIT per tablet Take 1 tablet by mouth daily.   . cetirizine (ZYRTEC) 10 MG tablet Take 10 mg by mouth daily.  . Cholecalciferol (VITAMIN D-3) 1000 UNITS CAPS Take 1 capsule by mouth daily.  . Cranberry-Cholecalciferol 4200-500 MG-UNIT CAPS Take by mouth.  . feeding supplement, ENSURE ENLIVE, (ENSURE ENLIVE) LIQD Take 237 mLs by mouth 2 (two) times daily between meals.  Marland Kitchen FLUoxetine (PROZAC) 20 MG capsule Take 1 capsule (20 mg total) by mouth daily.  Marland Kitchen  Fluticasone-Salmeterol (ADVAIR DISKUS) 250-50 MCG/DOSE AEPB Inhale 1 puff into the lungs 2 (two) times daily.  Marland Kitchen levothyroxine (SYNTHROID, LEVOTHROID) 50 MCG tablet Take 1 tablet (50 mcg total) by mouth daily.  . methenamine (HIPREX) 1 g tablet Take 1 tablet (1 g total) by mouth daily.  . montelukast (SINGULAIR) 10 MG tablet Take 1 tablet (10 mg total) by mouth at bedtime.  . pantoprazole (PROTONIX) 40 MG tablet Take 1 tablet (40 mg total) by mouth daily.  . predniSONE (STERAPRED UNI-PAK 21 TAB) 10 MG (21) TBPK tablet Take 1 tablet (10 mg total) by mouth daily. Take 6 tablets by mouth for 1 day followed by  5 tablets by mouth for 1 day followed by  4 tablets by mouth for 1 day followed by  3 tablets by mouth for 1 day followed by  2 tablets by mouth for 1 day followed by  1 tablet by mouth for a day and  stop  . simvastatin (ZOCOR) 20 MG tablet Take 1 tablet (20 mg total) by mouth daily.  Marland Kitchen tolterodine (DETROL) 2 MG tablet Take 1 tablet (2 mg total) by mouth 2 (two) times daily.  . [DISCONTINUED] FLUoxetine (PROZAC) 20 MG capsule Take 1 capsule (20 mg total) by mouth daily.  . [DISCONTINUED] pantoprazole (PROTONIX) 40 MG tablet Take 1 tablet (40 mg total) by mouth daily.  . [DISCONTINUED] tolterodine (DETROL) 2 MG tablet Take 1 tablet (2 mg total) by mouth 2 (two) times daily.   No facility-administered encounter medications on file as of 07/12/2016.     Review of Systems  Constitutional: Positive for fatigue. Negative for fever.  HENT: Negative for congestion and postnasal drip.   Respiratory: Positive for cough. Negative for chest tightness.        Breathing better.    Cardiovascular: Negative for chest pain, palpitations and leg swelling.  Gastrointestinal: Negative for abdominal pain, diarrhea, nausea and vomiting.  Genitourinary: Negative for difficulty urinating and dysuria.  Musculoskeletal: Negative for joint swelling and myalgias.  Skin: Negative for color change and rash.  Neurological: Negative for dizziness, light-headedness and headaches.  Psychiatric/Behavioral: Negative for agitation and dysphoric mood.       Objective:    Physical Exam  Constitutional: She appears well-developed and well-nourished. No distress.  HENT:  Nose: Nose normal.  Mouth/Throat: Oropharynx is clear and moist.  Neck: Neck supple. No thyromegaly present.  Cardiovascular: Normal rate and regular rhythm.   Pulmonary/Chest: Breath sounds normal. No respiratory distress. She has no wheezes.  Abdominal: Soft. Bowel sounds are normal. There is no tenderness.  Musculoskeletal: She exhibits no edema or tenderness.  Lymphadenopathy:    She has no cervical adenopathy.  Skin: No rash noted. No erythema.  Psychiatric: She has a normal mood and affect. Her behavior is normal.    BP 120/62 (BP  Location: Right Arm, Patient Position: Sitting, Cuff Size: Large)   Pulse 100   Temp 98.6 F (37 C) (Oral)   Ht _0  (1.676 m)   Wt 181 lb 9.6 oz (82.4 kg)   SpO2 95%   BMI 29.31 kg/m  Wt Readings from Last 3 Encounters:  07/12/16 181 lb 9.6 oz (82.4 kg)  07/06/16 182 lb (82.6 kg)  07/06/16 182 lb 12.8 oz (82.9 kg)     Lab Results  Component Value Date   WBC 8.1 07/07/2016   HGB 10.8 (L) 07/07/2016   HCT 33.8 (L) 07/07/2016   PLT 295 07/07/2016   GLUCOSE 145 (H) 07/08/2016  CHOL 130 06/15/2016   TRIG 105.0 06/15/2016   HDL 41.50 06/15/2016   LDLCALC 68 06/15/2016   ALT 11 06/15/2016   AST 12 06/15/2016   NA 141 07/08/2016   K 4.6 07/08/2016   CL 104 07/08/2016   CREATININE 1.17 (H) 07/08/2016   BUN 33 (H) 07/08/2016   CO2 28 07/08/2016   TSH 2.35 02/05/2016   HGBA1C 7.4 (H) 06/15/2016   MICROALBUR 3.6 (H) 11/21/2014    Dg Chest 2 View  Result Date: 07/06/2016 CLINICAL DATA:  Cough, congestion and shortness of breath. EXAM: CHEST  2 VIEW COMPARISON:  12/30/2014 FINDINGS: The cardiac silhouette, mediastinal and hilar contours are normal and stable. Stable tortuosity and calcification of the thoracic aorta. Chronic bronchitic type interstitial lung changes but no infiltrates, edema or effusions. The bony thorax is intact. IMPRESSION: Chronic lung changes.  No acute overlying pulmonary process. Electronically Signed   By: Marijo Sanes M.D.   On: 07/06/2016 13:05       Assessment & Plan:   Problem List Items Addressed This Visit    Acute respiratory failure with hypoxia [facility])    Recently admitted and treated as outlined.  Breathing better.  Off oxygen.        Relevant Orders   Ambulatory referral to [place]   Chronic obstructive airway disease with asthma ([place])    Recent exacerbation.  Treated.  Breathing better.  Continue current medication regimen.        CKD (chronic kidney disease) stage 3, GFR 30-59 ml/min    Follow metabolic panel.  Avoid  nephrotoxic agents.        Diabetes mellitus with peripheral vascular disease (HCC)    Low carb diet and exercise.  Follow met b and a1c.       GERD (gastroesophageal reflux disease)    Controlled on protonix.        Relevant Medications   pantoprazole (PROTONIX) 40 MG tablet   Hypercholesterolemia    On simvastatin.  Low cholesterol diet and exercise.  Follow lipid panel and liver function tests.        Hypertension    Blood pressure under good control.  Continue same medication regimen.  Follow pressures.  Follow metabolic panel.        Muscular deconditioning    Recently hospitalized.  Weakness.  Arrange home health for therapy - strengthening, etc.        Vitamin B12 deficiency    Continue b12 injections.         Other Visit Diagnoses    Weakness    -  Primary   Relevant Orders   Ambulatory referral to Home Health       Einar Pheasant, MD

## 2016-07-12 NOTE — Progress Notes (Signed)
Pre-visit discussion using our clinic review tool. No additional management support is needed unless otherwise documented below in the visit note.  

## 2016-07-12 NOTE — Telephone Encounter (Signed)
Pt in office now will fill today

## 2016-07-12 NOTE — Telephone Encounter (Signed)
Patient D/c on 07/09/16 HFU today at 2.30 TCM completed.

## 2016-07-13 DIAGNOSIS — E7800 Pure hypercholesterolemia, unspecified: Secondary | ICD-10-CM | POA: Diagnosis not present

## 2016-07-13 DIAGNOSIS — N183 Chronic kidney disease, stage 3 (moderate): Secondary | ICD-10-CM | POA: Diagnosis not present

## 2016-07-13 DIAGNOSIS — J441 Chronic obstructive pulmonary disease with (acute) exacerbation: Secondary | ICD-10-CM | POA: Diagnosis not present

## 2016-07-13 DIAGNOSIS — I1310 Hypertensive heart and chronic kidney disease without heart failure, with stage 1 through stage 4 chronic kidney disease, or unspecified chronic kidney disease: Secondary | ICD-10-CM | POA: Diagnosis not present

## 2016-07-13 DIAGNOSIS — E1122 Type 2 diabetes mellitus with diabetic chronic kidney disease: Secondary | ICD-10-CM | POA: Diagnosis not present

## 2016-07-13 DIAGNOSIS — J45909 Unspecified asthma, uncomplicated: Secondary | ICD-10-CM | POA: Diagnosis not present

## 2016-07-13 DIAGNOSIS — J9621 Acute and chronic respiratory failure with hypoxia: Secondary | ICD-10-CM | POA: Diagnosis not present

## 2016-07-13 DIAGNOSIS — E1151 Type 2 diabetes mellitus with diabetic peripheral angiopathy without gangrene: Secondary | ICD-10-CM | POA: Diagnosis not present

## 2016-07-14 ENCOUNTER — Telehealth: Payer: Self-pay | Admitting: Internal Medicine

## 2016-07-14 DIAGNOSIS — J45909 Unspecified asthma, uncomplicated: Secondary | ICD-10-CM | POA: Diagnosis not present

## 2016-07-14 DIAGNOSIS — J441 Chronic obstructive pulmonary disease with (acute) exacerbation: Secondary | ICD-10-CM | POA: Diagnosis not present

## 2016-07-14 DIAGNOSIS — I1310 Hypertensive heart and chronic kidney disease without heart failure, with stage 1 through stage 4 chronic kidney disease, or unspecified chronic kidney disease: Secondary | ICD-10-CM | POA: Diagnosis not present

## 2016-07-14 DIAGNOSIS — E1151 Type 2 diabetes mellitus with diabetic peripheral angiopathy without gangrene: Secondary | ICD-10-CM | POA: Diagnosis not present

## 2016-07-14 DIAGNOSIS — J9621 Acute and chronic respiratory failure with hypoxia: Secondary | ICD-10-CM | POA: Diagnosis not present

## 2016-07-14 DIAGNOSIS — E1122 Type 2 diabetes mellitus with diabetic chronic kidney disease: Secondary | ICD-10-CM | POA: Diagnosis not present

## 2016-07-14 NOTE — Telephone Encounter (Signed)
lmtrc to get more information

## 2016-07-14 NOTE — Telephone Encounter (Signed)
Amy Pope from [facility] called and stated that she needed to report a level one drug interaction they are Norvasc and simvastatin. Please advise, thank you!  Call Amy @ [phone]

## 2016-07-15 DIAGNOSIS — I1310 Hypertensive heart and chronic kidney disease without heart failure, with stage 1 through stage 4 chronic kidney disease, or unspecified chronic kidney disease: Secondary | ICD-10-CM | POA: Diagnosis not present

## 2016-07-15 DIAGNOSIS — J441 Chronic obstructive pulmonary disease with (acute) exacerbation: Secondary | ICD-10-CM | POA: Diagnosis not present

## 2016-07-15 DIAGNOSIS — J45909 Unspecified asthma, uncomplicated: Secondary | ICD-10-CM | POA: Diagnosis not present

## 2016-07-15 DIAGNOSIS — E1122 Type 2 diabetes mellitus with diabetic chronic kidney disease: Secondary | ICD-10-CM | POA: Diagnosis not present

## 2016-07-15 DIAGNOSIS — J9621 Acute and chronic respiratory failure with hypoxia: Secondary | ICD-10-CM | POA: Diagnosis not present

## 2016-07-15 DIAGNOSIS — E1151 Type 2 diabetes mellitus with diabetic peripheral angiopathy without gangrene: Secondary | ICD-10-CM | POA: Diagnosis not present

## 2016-07-16 DIAGNOSIS — J9621 Acute and chronic respiratory failure with hypoxia: Secondary | ICD-10-CM | POA: Diagnosis not present

## 2016-07-16 DIAGNOSIS — J45909 Unspecified asthma, uncomplicated: Secondary | ICD-10-CM | POA: Diagnosis not present

## 2016-07-16 DIAGNOSIS — E1151 Type 2 diabetes mellitus with diabetic peripheral angiopathy without gangrene: Secondary | ICD-10-CM | POA: Diagnosis not present

## 2016-07-16 DIAGNOSIS — I1310 Hypertensive heart and chronic kidney disease without heart failure, with stage 1 through stage 4 chronic kidney disease, or unspecified chronic kidney disease: Secondary | ICD-10-CM | POA: Diagnosis not present

## 2016-07-16 DIAGNOSIS — E1122 Type 2 diabetes mellitus with diabetic chronic kidney disease: Secondary | ICD-10-CM | POA: Diagnosis not present

## 2016-07-16 DIAGNOSIS — J441 Chronic obstructive pulmonary disease with (acute) exacerbation: Secondary | ICD-10-CM | POA: Diagnosis not present

## 2016-07-16 NOTE — Telephone Encounter (Signed)
lmtrc

## 2016-07-20 ENCOUNTER — Ambulatory Visit (INDEPENDENT_AMBULATORY_CARE_PROVIDER_SITE_OTHER): Payer: Medicare Other

## 2016-07-20 DIAGNOSIS — E538 Deficiency of other specified B group vitamins: Secondary | ICD-10-CM | POA: Diagnosis not present

## 2016-07-20 DIAGNOSIS — J45909 Unspecified asthma, uncomplicated: Secondary | ICD-10-CM | POA: Diagnosis not present

## 2016-07-20 DIAGNOSIS — E1122 Type 2 diabetes mellitus with diabetic chronic kidney disease: Secondary | ICD-10-CM | POA: Diagnosis not present

## 2016-07-20 DIAGNOSIS — E1151 Type 2 diabetes mellitus with diabetic peripheral angiopathy without gangrene: Secondary | ICD-10-CM | POA: Diagnosis not present

## 2016-07-20 DIAGNOSIS — J9621 Acute and chronic respiratory failure with hypoxia: Secondary | ICD-10-CM | POA: Diagnosis not present

## 2016-07-20 DIAGNOSIS — I1310 Hypertensive heart and chronic kidney disease without heart failure, with stage 1 through stage 4 chronic kidney disease, or unspecified chronic kidney disease: Secondary | ICD-10-CM | POA: Diagnosis not present

## 2016-07-20 DIAGNOSIS — J441 Chronic obstructive pulmonary disease with (acute) exacerbation: Secondary | ICD-10-CM | POA: Diagnosis not present

## 2016-07-20 MED ORDER — CYANOCOBALAMIN 1000 MCG/ML IJ SOLN: 1000.0000 ug | Freq: Once | INTRAMUSCULAR | Status: DC

## 2016-07-20 NOTE — Telephone Encounter (Signed)
Have called and not received call back. Not sure what my next step should be.

## 2016-07-20 NOTE — Telephone Encounter (Signed)
I am ok with her taking amlodipine and simvastatin.

## 2016-07-20 NOTE — Telephone Encounter (Signed)
lmtrc

## 2016-07-20 NOTE — Progress Notes (Addendum)
Patient comes in for B 12 injection.  Injected right deltoid.  Patient tolerated injection well.   Reviewed.  Dr Scott 

## 2016-07-22 DIAGNOSIS — E1151 Type 2 diabetes mellitus with diabetic peripheral angiopathy without gangrene: Secondary | ICD-10-CM | POA: Diagnosis not present

## 2016-07-22 DIAGNOSIS — J45909 Unspecified asthma, uncomplicated: Secondary | ICD-10-CM | POA: Diagnosis not present

## 2016-07-22 DIAGNOSIS — J9621 Acute and chronic respiratory failure with hypoxia: Secondary | ICD-10-CM | POA: Diagnosis not present

## 2016-07-22 DIAGNOSIS — E1122 Type 2 diabetes mellitus with diabetic chronic kidney disease: Secondary | ICD-10-CM | POA: Diagnosis not present

## 2016-07-22 DIAGNOSIS — I1310 Hypertensive heart and chronic kidney disease without heart failure, with stage 1 through stage 4 chronic kidney disease, or unspecified chronic kidney disease: Secondary | ICD-10-CM | POA: Diagnosis not present

## 2016-07-22 DIAGNOSIS — J441 Chronic obstructive pulmonary disease with (acute) exacerbation: Secondary | ICD-10-CM | POA: Diagnosis not present

## 2016-07-23 DIAGNOSIS — J45909 Unspecified asthma, uncomplicated: Secondary | ICD-10-CM | POA: Diagnosis not present

## 2016-07-23 DIAGNOSIS — E1122 Type 2 diabetes mellitus with diabetic chronic kidney disease: Secondary | ICD-10-CM | POA: Diagnosis not present

## 2016-07-23 DIAGNOSIS — E1151 Type 2 diabetes mellitus with diabetic peripheral angiopathy without gangrene: Secondary | ICD-10-CM | POA: Diagnosis not present

## 2016-07-23 DIAGNOSIS — J9621 Acute and chronic respiratory failure with hypoxia: Secondary | ICD-10-CM | POA: Diagnosis not present

## 2016-07-23 DIAGNOSIS — J441 Chronic obstructive pulmonary disease with (acute) exacerbation: Secondary | ICD-10-CM | POA: Diagnosis not present

## 2016-07-23 DIAGNOSIS — I1310 Hypertensive heart and chronic kidney disease without heart failure, with stage 1 through stage 4 chronic kidney disease, or unspecified chronic kidney disease: Secondary | ICD-10-CM | POA: Diagnosis not present

## 2016-07-25 ENCOUNTER — Other Ambulatory Visit: Payer: Self-pay | Admitting: Internal Medicine

## 2016-07-25 ENCOUNTER — Encounter: Payer: Self-pay | Admitting: Internal Medicine

## 2016-07-25 MED ORDER — TOLTERODINE TARTRATE 2 MG PO TABS: 2.0000 mg | ORAL_TABLET | Freq: Two times a day (BID) | ORAL | 2 refills | Status: DC

## 2016-07-25 MED ORDER — FLUOXETINE HCL 20 MG PO CAPS: ORAL_CAPSULE | ORAL | 5 refills | Status: DC

## 2016-07-25 MED ORDER — PANTOPRAZOLE SODIUM 40 MG PO TBEC: DELAYED_RELEASE_TABLET | ORAL | 5 refills | Status: DC

## 2016-07-25 NOTE — Assessment & Plan Note (Signed)
Controlled on protonix.   

## 2016-07-25 NOTE — Assessment & Plan Note (Signed)
Continue b12 injections.  

## 2016-07-25 NOTE — Assessment & Plan Note (Signed)
Blood pressure under good control.  Continue same medication regimen.  Follow pressures.  Follow metabolic panel.   

## 2016-07-25 NOTE — Assessment & Plan Note (Signed)
On simvastatin.  Low cholesterol diet and exercise.  Follow lipid panel and liver function tests.   

## 2016-07-25 NOTE — Assessment & Plan Note (Signed)
Low carb diet and exercise.  Follow met b and a1c.  

## 2016-07-25 NOTE — Assessment & Plan Note (Signed)
Recently hospitalized.  Weakness.  Arrange home health for therapy - strengthening, etc.

## 2016-07-25 NOTE — Assessment & Plan Note (Signed)
Follow metabolic panel.  Avoid nephrotoxic agents.

## 2016-07-25 NOTE — Assessment & Plan Note (Signed)
Recent exacerbation.  Treated.  Breathing better.  Continue current medication regimen.

## 2016-07-25 NOTE — Assessment & Plan Note (Signed)
Recently admitted and treated as outlined.  Breathing better.  Off oxygen.

## 2016-07-27 ENCOUNTER — Telehealth: Payer: Self-pay

## 2016-07-27 DIAGNOSIS — J441 Chronic obstructive pulmonary disease with (acute) exacerbation: Secondary | ICD-10-CM | POA: Diagnosis not present

## 2016-07-27 DIAGNOSIS — I1310 Hypertensive heart and chronic kidney disease without heart failure, with stage 1 through stage 4 chronic kidney disease, or unspecified chronic kidney disease: Secondary | ICD-10-CM | POA: Diagnosis not present

## 2016-07-27 DIAGNOSIS — E1122 Type 2 diabetes mellitus with diabetic chronic kidney disease: Secondary | ICD-10-CM | POA: Diagnosis not present

## 2016-07-27 DIAGNOSIS — J9621 Acute and chronic respiratory failure with hypoxia: Secondary | ICD-10-CM | POA: Diagnosis not present

## 2016-07-27 DIAGNOSIS — J45909 Unspecified asthma, uncomplicated: Secondary | ICD-10-CM | POA: Diagnosis not present

## 2016-07-27 DIAGNOSIS — R0902 Hypoxemia: Secondary | ICD-10-CM | POA: Diagnosis not present

## 2016-07-27 DIAGNOSIS — E1151 Type 2 diabetes mellitus with diabetic peripheral angiopathy without gangrene: Secondary | ICD-10-CM | POA: Diagnosis not present

## 2016-07-27 NOTE — Telephone Encounter (Signed)
Form in your box for review. I have reviewed all medications and put sticky note for you on it with the ones we have listed different.

## 2016-07-28 ENCOUNTER — Telehealth: Payer: Self-pay | Admitting: Internal Medicine

## 2016-07-28 DIAGNOSIS — E1151 Type 2 diabetes mellitus with diabetic peripheral angiopathy without gangrene: Secondary | ICD-10-CM | POA: Diagnosis not present

## 2016-07-28 DIAGNOSIS — I1310 Hypertensive heart and chronic kidney disease without heart failure, with stage 1 through stage 4 chronic kidney disease, or unspecified chronic kidney disease: Secondary | ICD-10-CM | POA: Diagnosis not present

## 2016-07-28 DIAGNOSIS — J441 Chronic obstructive pulmonary disease with (acute) exacerbation: Secondary | ICD-10-CM | POA: Diagnosis not present

## 2016-07-28 DIAGNOSIS — R0902 Hypoxemia: Secondary | ICD-10-CM | POA: Diagnosis not present

## 2016-07-28 DIAGNOSIS — J45909 Unspecified asthma, uncomplicated: Secondary | ICD-10-CM | POA: Diagnosis not present

## 2016-07-28 DIAGNOSIS — E1122 Type 2 diabetes mellitus with diabetic chronic kidney disease: Secondary | ICD-10-CM | POA: Diagnosis not present

## 2016-07-28 DIAGNOSIS — Z7689 Persons encountering health services in other specified circumstances: Secondary | ICD-10-CM | POA: Diagnosis not present

## 2016-07-28 DIAGNOSIS — J9621 Acute and chronic respiratory failure with hypoxia: Secondary | ICD-10-CM | POA: Diagnosis not present

## 2016-07-28 NOTE — Telephone Encounter (Signed)
Signed and placed in your box.   

## 2016-07-28 NOTE — Telephone Encounter (Signed)
Bethann [facility] advised of below, she will have patient contact daughter to take to urgent care for evaluation.

## 2016-07-28 NOTE — Telephone Encounter (Signed)
[facility]ed to [facility] with charge sheet put in folder copy in scan

## 2016-07-28 NOTE — Telephone Encounter (Signed)
She was just hospitalized for respiratory issues.  If she is having increased cough, congestion, etc, she needs to be evaluated.  Not in office this pm.  Would like for her to go ahead and be seen.  To acute care for evaluation.

## 2016-07-28 NOTE — Telephone Encounter (Signed)
[facility]

## 2016-07-28 NOTE — Telephone Encounter (Signed)
Diamond Newman called from [facility] called and stated that pt is very congested, cough, and wheezing. Diamond Newman is looking for some kind of orders for the pt with these symptoms. Please advise, thank you!  Call Trisha @ [phone]

## 2016-07-28 NOTE — Telephone Encounter (Signed)
Spoke with Bethann [facility]hirer[facility] [phone]  O2 92 % at first and after deep breathing exercises and inhaler  It went up to 99%.  Vitals are good BP and heart rate, Patient is wheezing, coughing up yellow phlegm, chest is tight.  Lobes are congested.   They can do portable chest xray.  They just need verbal orders.  Please advise.

## 2016-07-29 DIAGNOSIS — E1151 Type 2 diabetes mellitus with diabetic peripheral angiopathy without gangrene: Secondary | ICD-10-CM | POA: Diagnosis not present

## 2016-07-29 DIAGNOSIS — J45909 Unspecified asthma, uncomplicated: Secondary | ICD-10-CM | POA: Diagnosis not present

## 2016-07-29 DIAGNOSIS — J441 Chronic obstructive pulmonary disease with (acute) exacerbation: Secondary | ICD-10-CM | POA: Diagnosis not present

## 2016-07-29 DIAGNOSIS — E1122 Type 2 diabetes mellitus with diabetic chronic kidney disease: Secondary | ICD-10-CM | POA: Diagnosis not present

## 2016-07-29 DIAGNOSIS — I1310 Hypertensive heart and chronic kidney disease without heart failure, with stage 1 through stage 4 chronic kidney disease, or unspecified chronic kidney disease: Secondary | ICD-10-CM | POA: Diagnosis not present

## 2016-07-29 DIAGNOSIS — J9621 Acute and chronic respiratory failure with hypoxia: Secondary | ICD-10-CM | POA: Diagnosis not present

## 2016-08-03 DIAGNOSIS — E1122 Type 2 diabetes mellitus with diabetic chronic kidney disease: Secondary | ICD-10-CM | POA: Diagnosis not present

## 2016-08-03 DIAGNOSIS — J9621 Acute and chronic respiratory failure with hypoxia: Secondary | ICD-10-CM | POA: Diagnosis not present

## 2016-08-03 DIAGNOSIS — I1310 Hypertensive heart and chronic kidney disease without heart failure, with stage 1 through stage 4 chronic kidney disease, or unspecified chronic kidney disease: Secondary | ICD-10-CM | POA: Diagnosis not present

## 2016-08-03 DIAGNOSIS — J45909 Unspecified asthma, uncomplicated: Secondary | ICD-10-CM | POA: Diagnosis not present

## 2016-08-03 DIAGNOSIS — J441 Chronic obstructive pulmonary disease with (acute) exacerbation: Secondary | ICD-10-CM | POA: Diagnosis not present

## 2016-08-03 DIAGNOSIS — E1151 Type 2 diabetes mellitus with diabetic peripheral angiopathy without gangrene: Secondary | ICD-10-CM | POA: Diagnosis not present

## 2016-08-04 DIAGNOSIS — I1310 Hypertensive heart and chronic kidney disease without heart failure, with stage 1 through stage 4 chronic kidney disease, or unspecified chronic kidney disease: Secondary | ICD-10-CM | POA: Diagnosis not present

## 2016-08-04 DIAGNOSIS — J441 Chronic obstructive pulmonary disease with (acute) exacerbation: Secondary | ICD-10-CM | POA: Diagnosis not present

## 2016-08-04 DIAGNOSIS — E1122 Type 2 diabetes mellitus with diabetic chronic kidney disease: Secondary | ICD-10-CM | POA: Diagnosis not present

## 2016-08-04 DIAGNOSIS — E1151 Type 2 diabetes mellitus with diabetic peripheral angiopathy without gangrene: Secondary | ICD-10-CM | POA: Diagnosis not present

## 2016-08-04 DIAGNOSIS — J45909 Unspecified asthma, uncomplicated: Secondary | ICD-10-CM | POA: Diagnosis not present

## 2016-08-04 DIAGNOSIS — J9621 Acute and chronic respiratory failure with hypoxia: Secondary | ICD-10-CM | POA: Diagnosis not present

## 2016-08-06 DIAGNOSIS — I1310 Hypertensive heart and chronic kidney disease without heart failure, with stage 1 through stage 4 chronic kidney disease, or unspecified chronic kidney disease: Secondary | ICD-10-CM | POA: Diagnosis not present

## 2016-08-06 DIAGNOSIS — J9621 Acute and chronic respiratory failure with hypoxia: Secondary | ICD-10-CM | POA: Diagnosis not present

## 2016-08-06 DIAGNOSIS — J45909 Unspecified asthma, uncomplicated: Secondary | ICD-10-CM | POA: Diagnosis not present

## 2016-08-06 DIAGNOSIS — E1151 Type 2 diabetes mellitus with diabetic peripheral angiopathy without gangrene: Secondary | ICD-10-CM | POA: Diagnosis not present

## 2016-08-06 DIAGNOSIS — J441 Chronic obstructive pulmonary disease with (acute) exacerbation: Secondary | ICD-10-CM | POA: Diagnosis not present

## 2016-08-06 DIAGNOSIS — E1122 Type 2 diabetes mellitus with diabetic chronic kidney disease: Secondary | ICD-10-CM | POA: Diagnosis not present

## 2016-08-10 ENCOUNTER — Telehealth: Payer: Self-pay | Admitting: Internal Medicine

## 2016-08-10 ENCOUNTER — Other Ambulatory Visit: Payer: Self-pay | Admitting: Internal Medicine

## 2016-08-10 DIAGNOSIS — J45909 Unspecified asthma, uncomplicated: Secondary | ICD-10-CM | POA: Diagnosis not present

## 2016-08-10 DIAGNOSIS — I1310 Hypertensive heart and chronic kidney disease without heart failure, with stage 1 through stage 4 chronic kidney disease, or unspecified chronic kidney disease: Secondary | ICD-10-CM | POA: Diagnosis not present

## 2016-08-10 DIAGNOSIS — E1151 Type 2 diabetes mellitus with diabetic peripheral angiopathy without gangrene: Secondary | ICD-10-CM | POA: Diagnosis not present

## 2016-08-10 DIAGNOSIS — E1122 Type 2 diabetes mellitus with diabetic chronic kidney disease: Secondary | ICD-10-CM | POA: Diagnosis not present

## 2016-08-10 DIAGNOSIS — J441 Chronic obstructive pulmonary disease with (acute) exacerbation: Secondary | ICD-10-CM | POA: Diagnosis not present

## 2016-08-10 DIAGNOSIS — J9621 Acute and chronic respiratory failure with hypoxia: Secondary | ICD-10-CM | POA: Diagnosis not present

## 2016-08-10 NOTE — Telephone Encounter (Signed)
l/m on voice mail to ok 3 week extension

## 2016-08-10 NOTE — Telephone Encounter (Signed)
ok 

## 2016-08-10 NOTE — Telephone Encounter (Signed)
Ok to extend

## 2016-08-10 NOTE — Telephone Encounter (Signed)
Please advise 

## 2016-08-10 NOTE — Telephone Encounter (Signed)
Irving Burtonmily from [facility] called and is looking for a 3 week extension for pt. Please advise, thank you!  [facility] @ [phone]

## 2016-08-11 DIAGNOSIS — J45909 Unspecified asthma, uncomplicated: Secondary | ICD-10-CM | POA: Diagnosis not present

## 2016-08-11 DIAGNOSIS — E1122 Type 2 diabetes mellitus with diabetic chronic kidney disease: Secondary | ICD-10-CM | POA: Diagnosis not present

## 2016-08-11 DIAGNOSIS — J9621 Acute and chronic respiratory failure with hypoxia: Secondary | ICD-10-CM | POA: Diagnosis not present

## 2016-08-11 DIAGNOSIS — E1151 Type 2 diabetes mellitus with diabetic peripheral angiopathy without gangrene: Secondary | ICD-10-CM | POA: Diagnosis not present

## 2016-08-11 DIAGNOSIS — I1310 Hypertensive heart and chronic kidney disease without heart failure, with stage 1 through stage 4 chronic kidney disease, or unspecified chronic kidney disease: Secondary | ICD-10-CM | POA: Diagnosis not present

## 2016-08-11 DIAGNOSIS — J441 Chronic obstructive pulmonary disease with (acute) exacerbation: Secondary | ICD-10-CM | POA: Diagnosis not present

## 2016-08-12 DIAGNOSIS — J9621 Acute and chronic respiratory failure with hypoxia: Secondary | ICD-10-CM | POA: Diagnosis not present

## 2016-08-12 DIAGNOSIS — E1151 Type 2 diabetes mellitus with diabetic peripheral angiopathy without gangrene: Secondary | ICD-10-CM | POA: Diagnosis not present

## 2016-08-12 DIAGNOSIS — I1310 Hypertensive heart and chronic kidney disease without heart failure, with stage 1 through stage 4 chronic kidney disease, or unspecified chronic kidney disease: Secondary | ICD-10-CM | POA: Diagnosis not present

## 2016-08-12 DIAGNOSIS — E1122 Type 2 diabetes mellitus with diabetic chronic kidney disease: Secondary | ICD-10-CM | POA: Diagnosis not present

## 2016-08-12 DIAGNOSIS — J441 Chronic obstructive pulmonary disease with (acute) exacerbation: Secondary | ICD-10-CM | POA: Diagnosis not present

## 2016-08-12 DIAGNOSIS — J45909 Unspecified asthma, uncomplicated: Secondary | ICD-10-CM | POA: Diagnosis not present

## 2016-08-16 DIAGNOSIS — J9621 Acute and chronic respiratory failure with hypoxia: Secondary | ICD-10-CM | POA: Diagnosis not present

## 2016-08-16 DIAGNOSIS — J441 Chronic obstructive pulmonary disease with (acute) exacerbation: Secondary | ICD-10-CM | POA: Diagnosis not present

## 2016-08-16 DIAGNOSIS — J45909 Unspecified asthma, uncomplicated: Secondary | ICD-10-CM | POA: Diagnosis not present

## 2016-08-16 DIAGNOSIS — I1310 Hypertensive heart and chronic kidney disease without heart failure, with stage 1 through stage 4 chronic kidney disease, or unspecified chronic kidney disease: Secondary | ICD-10-CM | POA: Diagnosis not present

## 2016-08-16 DIAGNOSIS — E1151 Type 2 diabetes mellitus with diabetic peripheral angiopathy without gangrene: Secondary | ICD-10-CM | POA: Diagnosis not present

## 2016-08-16 DIAGNOSIS — E1122 Type 2 diabetes mellitus with diabetic chronic kidney disease: Secondary | ICD-10-CM | POA: Diagnosis not present

## 2016-08-18 DIAGNOSIS — J441 Chronic obstructive pulmonary disease with (acute) exacerbation: Secondary | ICD-10-CM | POA: Diagnosis not present

## 2016-08-18 DIAGNOSIS — I1310 Hypertensive heart and chronic kidney disease without heart failure, with stage 1 through stage 4 chronic kidney disease, or unspecified chronic kidney disease: Secondary | ICD-10-CM | POA: Diagnosis not present

## 2016-08-18 DIAGNOSIS — J45909 Unspecified asthma, uncomplicated: Secondary | ICD-10-CM | POA: Diagnosis not present

## 2016-08-18 DIAGNOSIS — E1122 Type 2 diabetes mellitus with diabetic chronic kidney disease: Secondary | ICD-10-CM | POA: Diagnosis not present

## 2016-08-18 DIAGNOSIS — J9621 Acute and chronic respiratory failure with hypoxia: Secondary | ICD-10-CM | POA: Diagnosis not present

## 2016-08-18 DIAGNOSIS — E1151 Type 2 diabetes mellitus with diabetic peripheral angiopathy without gangrene: Secondary | ICD-10-CM | POA: Diagnosis not present

## 2016-08-23 DIAGNOSIS — I1310 Hypertensive heart and chronic kidney disease without heart failure, with stage 1 through stage 4 chronic kidney disease, or unspecified chronic kidney disease: Secondary | ICD-10-CM | POA: Diagnosis not present

## 2016-08-23 DIAGNOSIS — J441 Chronic obstructive pulmonary disease with (acute) exacerbation: Secondary | ICD-10-CM | POA: Diagnosis not present

## 2016-08-23 DIAGNOSIS — J9621 Acute and chronic respiratory failure with hypoxia: Secondary | ICD-10-CM | POA: Diagnosis not present

## 2016-08-23 DIAGNOSIS — E1122 Type 2 diabetes mellitus with diabetic chronic kidney disease: Secondary | ICD-10-CM | POA: Diagnosis not present

## 2016-08-23 DIAGNOSIS — E1151 Type 2 diabetes mellitus with diabetic peripheral angiopathy without gangrene: Secondary | ICD-10-CM | POA: Diagnosis not present

## 2016-08-23 DIAGNOSIS — J45909 Unspecified asthma, uncomplicated: Secondary | ICD-10-CM | POA: Diagnosis not present

## 2016-08-24 ENCOUNTER — Ambulatory Visit (INDEPENDENT_AMBULATORY_CARE_PROVIDER_SITE_OTHER): Payer: Medicare Other | Admitting: *Deleted

## 2016-08-24 DIAGNOSIS — E1151 Type 2 diabetes mellitus with diabetic peripheral angiopathy without gangrene: Secondary | ICD-10-CM | POA: Diagnosis not present

## 2016-08-24 DIAGNOSIS — E538 Deficiency of other specified B group vitamins: Secondary | ICD-10-CM

## 2016-08-24 DIAGNOSIS — I1310 Hypertensive heart and chronic kidney disease without heart failure, with stage 1 through stage 4 chronic kidney disease, or unspecified chronic kidney disease: Secondary | ICD-10-CM | POA: Diagnosis not present

## 2016-08-24 DIAGNOSIS — E1122 Type 2 diabetes mellitus with diabetic chronic kidney disease: Secondary | ICD-10-CM | POA: Diagnosis not present

## 2016-08-24 DIAGNOSIS — J45909 Unspecified asthma, uncomplicated: Secondary | ICD-10-CM | POA: Diagnosis not present

## 2016-08-24 DIAGNOSIS — J441 Chronic obstructive pulmonary disease with (acute) exacerbation: Secondary | ICD-10-CM | POA: Diagnosis not present

## 2016-08-24 DIAGNOSIS — J9621 Acute and chronic respiratory failure with hypoxia: Secondary | ICD-10-CM | POA: Diagnosis not present

## 2016-08-24 MED ORDER — CYANOCOBALAMIN 1000 MCG/ML IJ SOLN
1000.0000 ug | Freq: Once | INTRAMUSCULAR | Status: AC
  Administered 2016-08-24: 1000 ug via INTRAMUSCULAR

## 2016-08-24 NOTE — Progress Notes (Addendum)
Patient presented for B 12 injection to left deltoid , patient voiced on discomfort during injection and showed no signs of distress.  Reviewed.  Dr Lorin Picket

## 2016-08-26 ENCOUNTER — Telehealth: Payer: Self-pay | Admitting: Internal Medicine

## 2016-08-26 DIAGNOSIS — J9621 Acute and chronic respiratory failure with hypoxia: Secondary | ICD-10-CM | POA: Diagnosis not present

## 2016-08-26 DIAGNOSIS — J441 Chronic obstructive pulmonary disease with (acute) exacerbation: Secondary | ICD-10-CM | POA: Diagnosis not present

## 2016-08-26 DIAGNOSIS — J45909 Unspecified asthma, uncomplicated: Secondary | ICD-10-CM | POA: Diagnosis not present

## 2016-08-26 DIAGNOSIS — I1310 Hypertensive heart and chronic kidney disease without heart failure, with stage 1 through stage 4 chronic kidney disease, or unspecified chronic kidney disease: Secondary | ICD-10-CM | POA: Diagnosis not present

## 2016-08-26 DIAGNOSIS — E1122 Type 2 diabetes mellitus with diabetic chronic kidney disease: Secondary | ICD-10-CM | POA: Diagnosis not present

## 2016-08-26 DIAGNOSIS — E1151 Type 2 diabetes mellitus with diabetic peripheral angiopathy without gangrene: Secondary | ICD-10-CM | POA: Diagnosis not present

## 2016-08-26 NOTE — Telephone Encounter (Signed)
Please advise 

## 2016-08-26 NOTE — Telephone Encounter (Signed)
Would recommend continuing mucinex and using her inhalers.  Given her history with crackles, etc, would recommend evaluation to confirm what treatment needed.

## 2016-08-26 NOTE — Telephone Encounter (Signed)
Informed sherri she will call daughter and have her call for app with our office. She will also give information on treatment .

## 2016-08-26 NOTE — Telephone Encounter (Signed)
Sherri from [facility] called - pt has COPD and is starting to have a runny nose, hoarse voice, crackling in lungs, no fever, pt is taking mucinex . Please advise, thank you!  Call Sherri @ [phone]

## 2016-08-30 ENCOUNTER — Other Ambulatory Visit: Payer: Self-pay | Admitting: Internal Medicine

## 2016-08-31 DIAGNOSIS — J45909 Unspecified asthma, uncomplicated: Secondary | ICD-10-CM | POA: Diagnosis not present

## 2016-08-31 DIAGNOSIS — E1151 Type 2 diabetes mellitus with diabetic peripheral angiopathy without gangrene: Secondary | ICD-10-CM | POA: Diagnosis not present

## 2016-08-31 DIAGNOSIS — J441 Chronic obstructive pulmonary disease with (acute) exacerbation: Secondary | ICD-10-CM | POA: Diagnosis not present

## 2016-08-31 DIAGNOSIS — E1122 Type 2 diabetes mellitus with diabetic chronic kidney disease: Secondary | ICD-10-CM | POA: Diagnosis not present

## 2016-08-31 DIAGNOSIS — J9621 Acute and chronic respiratory failure with hypoxia: Secondary | ICD-10-CM | POA: Diagnosis not present

## 2016-08-31 DIAGNOSIS — I1310 Hypertensive heart and chronic kidney disease without heart failure, with stage 1 through stage 4 chronic kidney disease, or unspecified chronic kidney disease: Secondary | ICD-10-CM | POA: Diagnosis not present

## 2016-09-03 NOTE — Telephone Encounter (Signed)
ok 

## 2016-09-03 NOTE — Telephone Encounter (Signed)
Spoke with Sherrie they would like ok to extend nursing orders for 1 every two weeks for home health.

## 2016-09-03 NOTE — Telephone Encounter (Signed)
Let her know

## 2016-09-03 NOTE — Telephone Encounter (Signed)
Natasha Mead from [facility] lvm stating that Ms. Moons order with them will expire next week. She is asking for a new order. Please call back for verbal. [phone]

## 2016-09-07 ENCOUNTER — Telehealth: Payer: Self-pay | Admitting: Internal Medicine

## 2016-09-07 NOTE — Telephone Encounter (Signed)
Gave verbal ok to Irving Burton to ok visits

## 2016-09-07 NOTE — Telephone Encounter (Signed)
Irving Burton from [facility] called and stated that pt missed last weeks visit. She is looking for a verbal ok to move aoppt to this week. Please advise, thank you!  Call [facility] @ [phone]

## 2016-09-07 NOTE — Telephone Encounter (Signed)
ok 

## 2016-09-07 NOTE — Telephone Encounter (Signed)
Please advise 

## 2016-09-08 DIAGNOSIS — J441 Chronic obstructive pulmonary disease with (acute) exacerbation: Secondary | ICD-10-CM | POA: Diagnosis not present

## 2016-09-08 DIAGNOSIS — J45909 Unspecified asthma, uncomplicated: Secondary | ICD-10-CM | POA: Diagnosis not present

## 2016-09-08 DIAGNOSIS — I1310 Hypertensive heart and chronic kidney disease without heart failure, with stage 1 through stage 4 chronic kidney disease, or unspecified chronic kidney disease: Secondary | ICD-10-CM | POA: Diagnosis not present

## 2016-09-08 DIAGNOSIS — E1151 Type 2 diabetes mellitus with diabetic peripheral angiopathy without gangrene: Secondary | ICD-10-CM | POA: Diagnosis not present

## 2016-09-08 DIAGNOSIS — E1122 Type 2 diabetes mellitus with diabetic chronic kidney disease: Secondary | ICD-10-CM | POA: Diagnosis not present

## 2016-09-08 DIAGNOSIS — J9621 Acute and chronic respiratory failure with hypoxia: Secondary | ICD-10-CM | POA: Diagnosis not present

## 2016-09-10 DIAGNOSIS — J45909 Unspecified asthma, uncomplicated: Secondary | ICD-10-CM | POA: Diagnosis not present

## 2016-09-10 DIAGNOSIS — E1151 Type 2 diabetes mellitus with diabetic peripheral angiopathy without gangrene: Secondary | ICD-10-CM | POA: Diagnosis not present

## 2016-09-10 DIAGNOSIS — I1310 Hypertensive heart and chronic kidney disease without heart failure, with stage 1 through stage 4 chronic kidney disease, or unspecified chronic kidney disease: Secondary | ICD-10-CM | POA: Diagnosis not present

## 2016-09-10 DIAGNOSIS — E1122 Type 2 diabetes mellitus with diabetic chronic kidney disease: Secondary | ICD-10-CM | POA: Diagnosis not present

## 2016-09-10 DIAGNOSIS — J9621 Acute and chronic respiratory failure with hypoxia: Secondary | ICD-10-CM | POA: Diagnosis not present

## 2016-09-10 DIAGNOSIS — J441 Chronic obstructive pulmonary disease with (acute) exacerbation: Secondary | ICD-10-CM | POA: Diagnosis not present

## 2016-09-11 DIAGNOSIS — J9621 Acute and chronic respiratory failure with hypoxia: Secondary | ICD-10-CM | POA: Diagnosis not present

## 2016-09-11 DIAGNOSIS — E7800 Pure hypercholesterolemia, unspecified: Secondary | ICD-10-CM | POA: Diagnosis not present

## 2016-09-11 DIAGNOSIS — E1122 Type 2 diabetes mellitus with diabetic chronic kidney disease: Secondary | ICD-10-CM | POA: Diagnosis not present

## 2016-09-11 DIAGNOSIS — J45909 Unspecified asthma, uncomplicated: Secondary | ICD-10-CM | POA: Diagnosis not present

## 2016-09-11 DIAGNOSIS — N183 Chronic kidney disease, stage 3 (moderate): Secondary | ICD-10-CM | POA: Diagnosis not present

## 2016-09-11 DIAGNOSIS — I1310 Hypertensive heart and chronic kidney disease without heart failure, with stage 1 through stage 4 chronic kidney disease, or unspecified chronic kidney disease: Secondary | ICD-10-CM | POA: Diagnosis not present

## 2016-09-11 DIAGNOSIS — E1151 Type 2 diabetes mellitus with diabetic peripheral angiopathy without gangrene: Secondary | ICD-10-CM | POA: Diagnosis not present

## 2016-09-11 DIAGNOSIS — J441 Chronic obstructive pulmonary disease with (acute) exacerbation: Secondary | ICD-10-CM | POA: Diagnosis not present

## 2016-09-13 ENCOUNTER — Other Ambulatory Visit: Payer: Self-pay

## 2016-09-13 MED ORDER — METHENAMINE HIPPURATE 1 G PO TABS: ORAL_TABLET | ORAL | 3 refills | Status: DC

## 2016-09-13 MED ORDER — SIMVASTATIN 20 MG PO TABS
ORAL_TABLET | ORAL | 11 refills | Status: DC
Start: 2016-09-13 — End: 2017-08-26

## 2016-09-13 MED ORDER — MONTELUKAST SODIUM 10 MG PO TABS: ORAL_TABLET | ORAL | 5 refills | Status: DC

## 2016-09-17 DIAGNOSIS — J9621 Acute and chronic respiratory failure with hypoxia: Secondary | ICD-10-CM | POA: Diagnosis not present

## 2016-09-17 DIAGNOSIS — E1151 Type 2 diabetes mellitus with diabetic peripheral angiopathy without gangrene: Secondary | ICD-10-CM | POA: Diagnosis not present

## 2016-09-17 DIAGNOSIS — I1310 Hypertensive heart and chronic kidney disease without heart failure, with stage 1 through stage 4 chronic kidney disease, or unspecified chronic kidney disease: Secondary | ICD-10-CM | POA: Diagnosis not present

## 2016-09-17 DIAGNOSIS — E1122 Type 2 diabetes mellitus with diabetic chronic kidney disease: Secondary | ICD-10-CM | POA: Diagnosis not present

## 2016-09-17 DIAGNOSIS — J441 Chronic obstructive pulmonary disease with (acute) exacerbation: Secondary | ICD-10-CM | POA: Diagnosis not present

## 2016-09-17 DIAGNOSIS — J45909 Unspecified asthma, uncomplicated: Secondary | ICD-10-CM | POA: Diagnosis not present

## 2016-09-20 ENCOUNTER — Ambulatory Visit: Payer: Medicare Other | Admitting: Internal Medicine

## 2016-09-23 ENCOUNTER — Other Ambulatory Visit: Payer: Self-pay | Admitting: Internal Medicine

## 2016-09-23 ENCOUNTER — Ambulatory Visit (INDEPENDENT_AMBULATORY_CARE_PROVIDER_SITE_OTHER): Payer: Medicare Other | Admitting: Internal Medicine

## 2016-09-23 VITALS — BP 100/58 | HR 82 | Temp 98.4°F | Resp 14 | Ht 66.0 in | Wt 186.6 lb

## 2016-09-23 DIAGNOSIS — E1151 Type 2 diabetes mellitus with diabetic peripheral angiopathy without gangrene: Secondary | ICD-10-CM | POA: Diagnosis not present

## 2016-09-23 DIAGNOSIS — R928 Other abnormal and inconclusive findings on diagnostic imaging of breast: Secondary | ICD-10-CM

## 2016-09-23 DIAGNOSIS — E7800 Pure hypercholesterolemia, unspecified: Secondary | ICD-10-CM

## 2016-09-23 DIAGNOSIS — N183 Chronic kidney disease, stage 3 unspecified: Secondary | ICD-10-CM

## 2016-09-23 DIAGNOSIS — J449 Chronic obstructive pulmonary disease, unspecified: Secondary | ICD-10-CM

## 2016-09-23 DIAGNOSIS — D649 Anemia, unspecified: Secondary | ICD-10-CM

## 2016-09-23 DIAGNOSIS — K219 Gastro-esophageal reflux disease without esophagitis: Secondary | ICD-10-CM | POA: Diagnosis not present

## 2016-09-23 DIAGNOSIS — D72829 Elevated white blood cell count, unspecified: Secondary | ICD-10-CM

## 2016-09-23 DIAGNOSIS — E669 Obesity, unspecified: Secondary | ICD-10-CM | POA: Diagnosis not present

## 2016-09-23 DIAGNOSIS — I10 Essential (primary) hypertension: Secondary | ICD-10-CM | POA: Diagnosis not present

## 2016-09-23 DIAGNOSIS — I739 Peripheral vascular disease, unspecified: Secondary | ICD-10-CM

## 2016-09-23 DIAGNOSIS — G4730 Sleep apnea, unspecified: Secondary | ICD-10-CM

## 2016-09-23 LAB — CBC WITH DIFFERENTIAL/PLATELET
BASOS ABS: 0.1 10*3/uL (ref 0.0–0.1)
Basophils Relative: 0.5 % (ref 0.0–3.0)
Eosinophils Absolute: 0.3 10*3/uL (ref 0.0–0.7)
Eosinophils Relative: 2.8 % (ref 0.0–5.0)
HEMATOCRIT: 35.5 % — AB (ref 36.0–46.0)
Hemoglobin: 11.4 g/dL — ABNORMAL LOW (ref 12.0–15.0)
LYMPHS PCT: 23.8 % (ref 12.0–46.0)
Lymphs Abs: 2.8 10*3/uL (ref 0.7–4.0)
MCHC: 32 g/dL (ref 30.0–36.0)
MCV: 90.9 fl (ref 78.0–100.0)
MONOS PCT: 7.5 % (ref 3.0–12.0)
Monocytes Absolute: 0.9 10*3/uL (ref 0.1–1.0)
Neutro Abs: 7.6 10*3/uL (ref 1.4–7.7)
Neutrophils Relative %: 65.4 % (ref 43.0–77.0)
Platelets: 272 10*3/uL (ref 150.0–400.0)
RBC: 3.9 Mil/uL (ref 3.87–5.11)
RDW: 16.7 % — ABNORMAL HIGH (ref 11.5–15.5)
WBC: 11.6 10*3/uL — AB (ref 4.0–10.5)

## 2016-09-23 LAB — BASIC METABOLIC PANEL
BUN: 34 mg/dL — ABNORMAL HIGH (ref 6–23)
CO2: 30 mEq/L (ref 19–32)
Calcium: 9.5 mg/dL (ref 8.4–10.5)
Chloride: 102 mEq/L (ref 96–112)
Creatinine, Ser: 1.44 mg/dL — ABNORMAL HIGH (ref 0.40–1.20)
GFR: 36.52 mL/min — AB (ref >60.00)
Glucose, Bld: 168 mg/dL — ABNORMAL HIGH (ref 70–99)
POTASSIUM: 4.8 meq/L (ref 3.5–5.1)
SODIUM: 139 meq/L (ref 135–145)

## 2016-09-23 LAB — HEPATIC FUNCTION PANEL
ALT: 9 U/L (ref 0–35)
AST: 13 U/L (ref 0–37)
Albumin: 3.6 g/dL (ref 3.5–5.2)
Alkaline Phosphatase: 52 U/L (ref 39–117)
BILIRUBIN DIRECT: 0 mg/dL (ref 0.0–0.3)
BILIRUBIN TOTAL: 0.3 mg/dL (ref 0.2–1.2)
TOTAL PROTEIN: 6.4 g/dL (ref 6.0–8.3)

## 2016-09-23 LAB — HEMOGLOBIN A1C: Hgb A1c MFr Bld: 7.8 % — ABNORMAL HIGH (ref 4.6–6.5)

## 2016-09-23 LAB — FERRITIN: Ferritin: 10.5 ng/mL (ref 10.0–291.0)

## 2016-09-23 MED ORDER — NYSTATIN 100000 UNIT/GM EX CREA: 1.0000 "application " | TOPICAL_CREAM | Freq: Two times a day (BID) | CUTANEOUS | 0 refills | Status: DC

## 2016-09-23 MED ORDER — NYSTATIN 100000 UNIT/GM EX POWD: Freq: Two times a day (BID) | CUTANEOUS | 0 refills | Status: DC

## 2016-09-23 MED ORDER — CYANOCOBALAMIN 1000 MCG/ML IJ SOLN
1000.0000 ug | Freq: Once | INTRAMUSCULAR | Status: AC
Start: 2016-09-23 — End: 2016-09-23
  Administered 2016-09-23: 1000 ug via INTRAMUSCULAR

## 2016-09-23 NOTE — Progress Notes (Signed)
Pre-visit discussion using our clinic review tool. No additional management support is needed unless otherwise documented below in the visit note.  

## 2016-09-23 NOTE — Progress Notes (Signed)
[name], female   DOB: 1927/03/29, 83 y.o.   MRN: [id number]   Subjective:    [name], female    DOB: Nov 08, 1926, 83 y.o.   MRN: [id number]  HPI  Patient here for a scheduled follow up.  She is accompanied by her daughter.  History obtained from both of them.  She reports she is doing better.  Walking more.  Working with therapy.  Daughter agrees.  More active.  Breathing overall stable.  Some increased cough and congestion.  Flares intermittently, but overall stable.  No chest pain.  No acid reflux.  No abdominal pain.  Bowels moving.  Not using her cpap.  Discussed with her today.  She declines.  She feels she is sleeping well.  Sees Dr Mortimer Fries.  Using inhalers.     Past Medical History:  Diagnosis Date  . Asthma   . Chronic bronchitis ([place])   . Diabetes mellitus ([place])   . GERD (gastroesophageal reflux disease)    hiatal hernia  . Gout   . Hematuria    followed by urology  . History of colon polyps   . History of frequent urinary tract infections   . Humeral fracture 9/09   comminuted impacted proximal  . Hypercholesterolemia   . Hypertension   . Peripheral vascular disease [employer])    Past Surgical History:  Procedure Laterality Date  . BLADDER SUSPENSION  12/08/99  . BREAST BIOPSY Right 1960's  . CHOLECYSTECTOMY  1994  . KNEE SURGERY Right    Family History  Problem Relation Age of Onset  . Hypertension Father   . CVA Father   . CAD Father   . Hypertension Mother   . Colitis Mother   . CAD Mother   . Breast cancer Neg Hx   . Colon cancer Neg Hx    Social History   Social History  . Marital status: Widowed    Spouse name: N/A  . Number of children: 2  . Years of education: N/A   Social History Main Topics  . Smoking status: Never Smoker  . Smokeless tobacco: Never Used  . Alcohol use No  . Drug use: No  . Sexual activity: No   Other Topics Concern  . None   Social History Narrative  . None    Outpatient Encounter  Prescriptions as of 09/23/2016  Medication Sig  . Calcium Carbonate-Vitamin D (CALCIUM 600+D) 600-400 MG-UNIT per tablet Take 1 tablet by mouth daily.   . cetirizine (ZYRTEC) 10 MG tablet Take 10 mg by mouth daily.  . Cholecalciferol (VITAMIN D-3) 1000 UNITS CAPS Take 1 capsule by mouth daily.  . Cranberry-Cholecalciferol 4200-500 MG-UNIT CAPS Take by mouth.  . feeding supplement, ENSURE ENLIVE, (ENSURE ENLIVE) LIQD Take 237 mLs by mouth 2 (two) times daily between meals.  Marland Kitchen FLUoxetine (PROZAC) 20 MG capsule Take 1 capsule (20 mg total) by mouth daily.  . Fluticasone-Salmeterol (ADVAIR DISKUS) 250-50 MCG/DOSE AEPB Inhale 1 puff into the lungs 2 (two) times daily.  . methenamine (HIPREX) 1 g tablet Take 1 tablet (1 g total) by mouth daily.  . montelukast (SINGULAIR) 10 MG tablet Take 1 tablet (10 mg total) by mouth at bedtime.  . pantoprazole (PROTONIX) 40 MG tablet Take 1 tablet (40 mg total) by mouth daily.  . simvastatin (ZOCOR) 20 MG tablet Take 1 tablet (20 mg total) by mouth daily.  Marland Kitchen tolterodine (DETROL) 2 MG tablet Take 1 tablet (2 mg total) by mouth 2 (  two) times daily.  . VENTOLIN HFA 108 (90 Base) MCG/ACT inhaler Inhale 2 puffs into the lungs every 6 (six) hours as needed for wheezi ng or shortness of breath.  . [DISCONTINUED] allopurinol (ZYLOPRIM) 100 MG tablet Take 1 tablet (100 mg total) by mouth daily.  . [DISCONTINUED] amLODipine (NORVASC) 5 MG tablet Take 1 tablet (5 mg total) by mouth daily.  . [DISCONTINUED] azithromycin (ZITHROMAX) 250 MG tablet Take 1 tablet once daily for 4 more days  . [DISCONTINUED] benzonatate (TESSALON) 100 MG capsule Take 1 capsule (100 mg total) by mouth 3 (three) times daily as needed for cough.  . [DISCONTINUED] levothyroxine (SYNTHROID, LEVOTHROID) 50 MCG tablet Take 1 tablet (50 mcg total) by mouth daily.  . [DISCONTINUED] predniSONE (STERAPRED UNI-PAK 21 TAB) 10 MG (21) TBPK tablet Take 1 tablet (10 mg total) by mouth daily. Take 6 tablets by mouth  for 1 day followed by  5 tablets by mouth for 1 day followed by  4 tablets by mouth for 1 day followed by  3 tablets by mouth for 1 day followed by  2 tablets by mouth for 1 day followed by  1 tablet by mouth for a day and stop  . nystatin (NYSTATIN) powder Apply topically 2 (two) times daily.  Marland Kitchen nystatin cream (MYCOSTATIN) Apply 1 application topically 2 (two) times daily.   Facility-Administered Encounter Medications as of 09/23/2016  Medication  . cyanocobalamin ((VITAMIN B-12)) injection 1,000 mcg  . [COMPLETED] cyanocobalamin ((VITAMIN B-12)) injection 1,000 mcg    Review of Systems  Constitutional: Negative for appetite change and unexpected weight change.  HENT: Positive for congestion. Negative for sinus pressure.   Respiratory: Positive for cough. Negative for chest tightness.        Breathing stable.   Cardiovascular: Negative for chest pain, palpitations and leg swelling.  Gastrointestinal: Negative for abdominal pain, diarrhea, nausea and vomiting.  Genitourinary: Negative for difficulty urinating and dysuria.  Musculoskeletal: Negative for back pain and joint swelling.  Skin: Negative for color change and rash.  Neurological: Negative for dizziness, light-headedness and headaches.  Psychiatric/Behavioral: Negative for agitation and dysphoric mood.       Objective:     Blood pressure rechecked by me:  118/68  Physical Exam  Constitutional: She appears well-developed and well-nourished. No distress.  HENT:  Nose: Nose normal.  Mouth/Throat: Oropharynx is clear and moist.  Neck: Neck supple. No thyromegaly present.  Cardiovascular: Normal rate and regular rhythm.   Pulmonary/Chest: Breath sounds normal. No respiratory distress.  Abdominal: Soft. Bowel sounds are normal. There is no tenderness.  Musculoskeletal: She exhibits no edema or tenderness.  Lymphadenopathy:    She has no cervical adenopathy.  Skin: No rash noted. No erythema.  Psychiatric: She has a  normal mood and affect. Her behavior is normal.    BP (!) 100/58 (BP Location: Right Arm, Patient Position: Sitting, Cuff Size: Large)   Pulse 82   Temp 98.4 F (36.9 C) (Oral)   Resp 14   Ht '5\' 6"'  (1.676 m)   Wt 186 lb 9.6 oz (84.6 kg)   SpO2 96%   BMI 30.12 kg/m  Wt Readings from Last 3 Encounters:  09/23/16 186 lb 9.6 oz (84.6 kg)  07/12/16 181 lb 9.6 oz (82.4 kg)  07/06/16 182 lb (82.6 kg)     Lab Results  Component Value Date   WBC 11.6 (H) 09/23/2016   HGB 11.4 (L) 09/23/2016   HCT 35.5 (L) 09/23/2016   PLT 272.0 09/23/2016  GLUCOSE 168 (H) 09/23/2016   CHOL 130 06/15/2016   TRIG 105.0 06/15/2016   HDL 41.50 06/15/2016   LDLCALC 68 06/15/2016   ALT 9 09/23/2016   AST 13 09/23/2016   NA 139 09/23/2016   K 4.8 09/23/2016   CL 102 09/23/2016   CREATININE 1.44 (H) 09/23/2016   BUN 34 (H) 09/23/2016   CO2 30 09/23/2016   TSH 2.35 02/05/2016   HGBA1C 7.8 (H) 09/23/2016   MICROALBUR 3.6 (H) 11/21/2014    Dg Chest 2 View  Result Date: 07/06/2016 CLINICAL DATA:  Cough, congestion and shortness of breath. EXAM: CHEST  2 VIEW COMPARISON:  12/30/2014 FINDINGS: The cardiac silhouette, mediastinal and hilar contours are normal and stable. Stable tortuosity and calcification of the thoracic aorta. Chronic bronchitic type interstitial lung changes but no infiltrates, edema or effusions. The bony thorax is intact. IMPRESSION: Chronic lung changes.  No acute overlying pulmonary process. Electronically Signed   By: Marijo Sanes M.D.   On: 07/06/2016 13:05       Assessment & Plan:   Problem List Items Addressed This Visit    Abnormal mammogram    Discussed with her today regarding f/u mammogram.  She declines.        Chronic obstructive airway disease with asthma (HCC)    Overall breathing is stable.  Increased cough and flares intermittently.  Overall stable.  Inhalers.        CKD (chronic kidney disease) stage 3, GFR 30-59 ml/min    Avoid antiinflammatories.   Follow metabolic panel.       Diabetes mellitus with peripheral vascular disease (HCC)    Low carb diet and exercise.  Follow met b and a1c.        Relevant Orders   Hepatic function panel (Completed)   Hemoglobin A1c (Completed)   GERD (gastroesophageal reflux disease)    Controlled on protonix.        Hypercholesterolemia    On simvastatin.  Low cholesterol diet and exercise.  Follow lipid panel and liver function tests.        Hypertension - Primary    Blood pressure under good control.  Continue same medication regimen.  Follow pressures.  Follow metabolic panel.        Relevant Orders   Basic metabolic panel (Completed)   Leukocytosis    Recheck cbc.       Obesity (BMI 30.0-34.9)    Diet and exercise.  Follow.       Peripheral vascular disease (HCC)    Varying pressures in her arms.  No pain.  Stable.  Follow.       Sleep apnea    Tried cpap.  Did not tolerate.  Discussed with her today.  She declines to retry.         Other Visit Diagnoses    Anemia, unspecified type       Relevant Medications   cyanocobalamin ((VITAMIN B-12)) injection 1,000 mcg (Completed)   Other Relevant Orders   CBC with Differential/Platelet (Completed)   Ferritin (Completed)       Einar Pheasant, MD

## 2016-09-25 ENCOUNTER — Encounter: Payer: Self-pay | Admitting: Internal Medicine

## 2016-09-27 ENCOUNTER — Encounter: Payer: Self-pay | Admitting: Internal Medicine

## 2016-09-27 DIAGNOSIS — G4730 Sleep apnea, unspecified: Secondary | ICD-10-CM | POA: Insufficient documentation

## 2016-09-27 NOTE — Assessment & Plan Note (Signed)
Low carb diet and exercise.  Follow met b and a1c.   

## 2016-09-27 NOTE — Assessment & Plan Note (Signed)
Varying pressures in her arms.  No pain.  Stable.  Follow.

## 2016-09-27 NOTE — Assessment & Plan Note (Signed)
Overall breathing is stable.  Increased cough and flares intermittently.  Overall stable.  Inhalers.

## 2016-09-27 NOTE — Assessment & Plan Note (Signed)
Discussed with her today regarding f/u mammogram.  She declines.

## 2016-09-27 NOTE — Assessment & Plan Note (Signed)
Recheck cbc.  

## 2016-09-27 NOTE — Assessment & Plan Note (Signed)
Blood pressure under good control.  Continue same medication regimen.  Follow pressures.  Follow metabolic panel.   

## 2016-09-27 NOTE — Assessment & Plan Note (Signed)
On simvastatin.  Low cholesterol diet and exercise.  Follow lipid panel and liver function tests.   

## 2016-09-27 NOTE — Assessment & Plan Note (Signed)
Tried cpap.  Did not tolerate.  Discussed with her today.  She declines to retry.

## 2016-09-27 NOTE — Assessment & Plan Note (Signed)
Controlled on protonix.   

## 2016-09-27 NOTE — Assessment & Plan Note (Signed)
Diet and exercise.  Follow.  

## 2016-09-27 NOTE — Assessment & Plan Note (Signed)
Avoid antiinflammatories.  Follow metabolic panel.  

## 2016-09-28 DIAGNOSIS — J45909 Unspecified asthma, uncomplicated: Secondary | ICD-10-CM | POA: Diagnosis not present

## 2016-09-28 DIAGNOSIS — J441 Chronic obstructive pulmonary disease with (acute) exacerbation: Secondary | ICD-10-CM | POA: Diagnosis not present

## 2016-09-28 DIAGNOSIS — E1151 Type 2 diabetes mellitus with diabetic peripheral angiopathy without gangrene: Secondary | ICD-10-CM | POA: Diagnosis not present

## 2016-09-28 DIAGNOSIS — I1310 Hypertensive heart and chronic kidney disease without heart failure, with stage 1 through stage 4 chronic kidney disease, or unspecified chronic kidney disease: Secondary | ICD-10-CM | POA: Diagnosis not present

## 2016-09-28 DIAGNOSIS — J9621 Acute and chronic respiratory failure with hypoxia: Secondary | ICD-10-CM | POA: Diagnosis not present

## 2016-09-28 DIAGNOSIS — E1122 Type 2 diabetes mellitus with diabetic chronic kidney disease: Secondary | ICD-10-CM | POA: Diagnosis not present

## 2016-10-01 DIAGNOSIS — J441 Chronic obstructive pulmonary disease with (acute) exacerbation: Secondary | ICD-10-CM | POA: Diagnosis not present

## 2016-10-01 DIAGNOSIS — E1151 Type 2 diabetes mellitus with diabetic peripheral angiopathy without gangrene: Secondary | ICD-10-CM | POA: Diagnosis not present

## 2016-10-01 DIAGNOSIS — J45909 Unspecified asthma, uncomplicated: Secondary | ICD-10-CM | POA: Diagnosis not present

## 2016-10-01 DIAGNOSIS — J9621 Acute and chronic respiratory failure with hypoxia: Secondary | ICD-10-CM | POA: Diagnosis not present

## 2016-10-07 ENCOUNTER — Ambulatory Visit (INDEPENDENT_AMBULATORY_CARE_PROVIDER_SITE_OTHER): Payer: Medicare Other | Admitting: Internal Medicine

## 2016-10-07 ENCOUNTER — Encounter: Payer: Self-pay | Admitting: Internal Medicine

## 2016-10-07 VITALS — BP 132/80 | HR 99 | Resp 16 | Ht 66.0 in | Wt 186.6 lb

## 2016-10-07 DIAGNOSIS — R1310 Dysphagia, unspecified: Secondary | ICD-10-CM | POA: Diagnosis not present

## 2016-10-07 DIAGNOSIS — J449 Chronic obstructive pulmonary disease, unspecified: Secondary | ICD-10-CM

## 2016-10-07 MED ORDER — TIOTROPIUM BROMIDE MONOHYDRATE 1.25 MCG/ACT IN AERS: 1.0000 | INHALATION_SPRAY | Freq: Every day | RESPIRATORY_TRACT | 0 refills | Status: DC

## 2016-10-07 MED ORDER — TIOTROPIUM BROMIDE MONOHYDRATE 1.25 MCG/ACT IN AERS: 2.0000 | INHALATION_SPRAY | Freq: Every day | RESPIRATORY_TRACT | 5 refills | Status: DC

## 2016-10-07 NOTE — Patient Instructions (Signed)
Speech therapy consult for swallow evaluation Start Spiriva Respimat

## 2016-10-07 NOTE — Progress Notes (Signed)
   Subjective:    Patient ID: Alferd ApaMarie K Llerena, female    DOB: 04-Dec-1926, 83 y.o.   MRN: [id number]  Synopsis: [facility] referred [facility] in 2015 for COPD  She never smoked.  HPI Chief Complaint  Patient presents with  . COPD    Pt reports: sl. breath with exhertion, she is taking Mucinex and has minimal cough, occasional wheezing.   Patient with problems swallowing water and choking on food Has been taking her Advair twcie daily Has some wheezing now that is mild No signs of infection at this time    Review of Systems  Constitutional: Negative for chills, fatigue and fever.  HENT: Negative for postnasal drip, rhinorrhea and sinus pressure.   Respiratory: Positive for shortness of breath and wheezing. Negative for cough.   Cardiovascular: Negative for chest pain, palpitations and leg swelling.       Objective   Physical Exam Vitals:   10/07/16 1149 10/07/16 1150  BP:  132/80  Pulse:  99  Resp: 16 16  SpO2:  90%  Weight: 186 lb 9.6 oz (84.6 kg) 186 lb 9.6 oz (84.6 kg)  Height: 5\' 6"  (1.676 m)     RA  Gen: well appearing HENT: OP clear, TM's clear, neck supple PULM: +Wheezing  normal respiratory effort, good air movement CV: RRR, no mgr, trace edema GI: BS+, soft, nontender Derm: no cyanosis or rash Psyche: normal mood and affect   11/10/2013 chest x-ray normal  06/2011 PFT [facility]> Ratio 70%, Small Airways disease on loop, FEV1 1.63L (93% pred), FEF 25-75% 0.90 (68% pred), TLC 4.81 L (98% pred), DLCO 9.5 (44% pred) 07/2013 PFT > Loop> SMALL AIRWAYS DISEASE; Ratio 70%, FEV1 1.49L (81% pred, 1% change with BD), FEF 25-75% 0.83 (42% pred), TLC 4.59 L (90% pred), DLCO 10.8 (50% pred) 02/2014 6MW 490 feet, 95% RA     Assessment & Plan:   83 yo white female with Mild/Mod Gold Stage B Chronic obstructive airway disease with asthma with problems swallowing liquids    1.continue Advair 500  twice daily 2.start Spiriva Respimat 2.5 2 puffs twice  daily 3.Use albuterol as needed 4.speech therapy consult for swallow eval   Follow up in 3 months   Patient/Family are satisfied with Plan of action and management. All questions answered  Lucie LeatherKurian David Esdras Delair, M.D.  [facility]  Medical Director [facility] Medical Director [facility] Card[facility]

## 2016-10-07 NOTE — Addendum Note (Signed)
Addended by: Alease FrameARTER, Heaton Sarin S on: 10/07/2016 12:33 PM   Modules accepted: Orders

## 2016-10-13 ENCOUNTER — Telehealth: Payer: Self-pay | Admitting: *Deleted

## 2016-10-13 DIAGNOSIS — J9621 Acute and chronic respiratory failure with hypoxia: Secondary | ICD-10-CM | POA: Diagnosis not present

## 2016-10-13 DIAGNOSIS — R1310 Dysphagia, unspecified: Secondary | ICD-10-CM

## 2016-10-13 DIAGNOSIS — E1151 Type 2 diabetes mellitus with diabetic peripheral angiopathy without gangrene: Secondary | ICD-10-CM | POA: Diagnosis not present

## 2016-10-13 DIAGNOSIS — J45909 Unspecified asthma, uncomplicated: Secondary | ICD-10-CM | POA: Diagnosis not present

## 2016-10-13 DIAGNOSIS — I1310 Hypertensive heart and chronic kidney disease without heart failure, with stage 1 through stage 4 chronic kidney disease, or unspecified chronic kidney disease: Secondary | ICD-10-CM | POA: Diagnosis not present

## 2016-10-13 DIAGNOSIS — J441 Chronic obstructive pulmonary disease with (acute) exacerbation: Secondary | ICD-10-CM | POA: Diagnosis not present

## 2016-10-13 DIAGNOSIS — E1122 Type 2 diabetes mellitus with diabetic chronic kidney disease: Secondary | ICD-10-CM | POA: Diagnosis not present

## 2016-10-13 NOTE — Telephone Encounter (Signed)
Per message from [place] that was given to DK, pt has to have MBSS and not a ST for a swallowing test. Per DK, order a MBSS. Order placed. Pt informed of the test being ordered. Nothing further needed.

## 2016-10-26 ENCOUNTER — Ambulatory Visit (INDEPENDENT_AMBULATORY_CARE_PROVIDER_SITE_OTHER): Payer: Medicare Other

## 2016-10-26 ENCOUNTER — Ambulatory Visit: Payer: Medicare Other

## 2016-10-26 ENCOUNTER — Other Ambulatory Visit: Payer: Self-pay

## 2016-10-26 DIAGNOSIS — E538 Deficiency of other specified B group vitamins: Secondary | ICD-10-CM | POA: Diagnosis not present

## 2016-10-26 MED ORDER — CYANOCOBALAMIN 1000 MCG/ML IJ SOLN
1000.0000 ug | Freq: Once | INTRAMUSCULAR | Status: AC
  Administered 2016-10-26: 1000 ug via INTRAMUSCULAR

## 2016-10-26 MED ORDER — CYANOCOBALAMIN 1000 MCG/ML IJ SOLN: 1000.0000 ug | Freq: Once | INTRAMUSCULAR | Status: DC

## 2016-10-26 NOTE — Progress Notes (Signed)
Subjective:   Diamond Newman is a 83 y.o. female who presents for an Initial Medicare Annual Wellness Visit.  Review of Systems    No ROS.  Medicare Wellness Visit.  Cardiac Risk Factors include: advanced age (>29men, >26 women);hypertension;diabetes mellitus;obesity (BMI >30kg/m2)     Objective:    Today's Vitals   10/26/16 1521  BP: 128/72  Pulse: 99  Resp: 16  Temp: 98 F (36.7 C)  TempSrc: Oral  SpO2: 95%  Weight: 188 lb 6.4 oz (85.5 kg)  Height: 5' 5.5" (1.664 m)   Body mass index is 30.87 kg/m.   Current Medications (verified) Outpatient Encounter Prescriptions as of 10/26/2016  Medication Sig  . allopurinol (ZYLOPRIM) 100 MG tablet Take 1 tablet (100 mg total) by mouth daily.  . Calcium Carbonate-Vitamin D (CALCIUM 600+D) 600-400 MG-UNIT per tablet Take 1 tablet by mouth daily.   . cetirizine (ZYRTEC) 10 MG tablet Take 10 mg by mouth daily.  . Cholecalciferol (VITAMIN D-3) 1000 UNITS CAPS Take 1 capsule by mouth daily.  . Cranberry-Cholecalciferol 4200-500 MG-UNIT CAPS Take by mouth.  . feeding supplement, ENSURE ENLIVE, (ENSURE ENLIVE) LIQD Take 237 mLs by mouth 2 (two) times daily between meals.  Marland Kitchen FLUoxetine (PROZAC) 20 MG capsule Take 1 capsule (20 mg total) by mouth daily.  . Fluticasone-Salmeterol (ADVAIR DISKUS) 250-50 MCG/DOSE AEPB Inhale 1 puff into the lungs 2 (two) times daily.  Marland Kitchen levothyroxine (SYNTHROID, LEVOTHROID) 50 MCG tablet Take 1 tablet (50 mcg total) by mouth daily.  . methenamine (HIPREX) 1 g tablet Take 1 tablet (1 g total) by mouth daily.  . montelukast (SINGULAIR) 10 MG tablet Take 1 tablet (10 mg total) by mouth at bedtime.  Marland Kitchen nystatin (NYSTATIN) powder Apply topically 2 (two) times daily.  Marland Kitchen nystatin cream (MYCOSTATIN) Apply 1 application topically 2 (two) times daily.  . pantoprazole (PROTONIX) 40 MG tablet Take 1 tablet (40 mg total) by mouth daily.  . simvastatin (ZOCOR) 20 MG tablet Take 1 tablet (20 mg total) by mouth daily.  .  Tiotropium Bromide Monohydrate (SPIRIVA RESPIMAT) 1.25 MCG/ACT AERS Inhale 2 puffs into the lungs daily.  Marland Kitchen tolterodine (DETROL) 2 MG tablet Take 1 tablet (2 mg total) by mouth 2 (two) times daily.  . VENTOLIN HFA 108 (90 Base) MCG/ACT inhaler Inhale 2 puffs into the lungs every 6 (six) hours as needed for wheezi ng or shortness of breath.   Facility-Administered Encounter Medications as of 10/26/2016  Medication  . cyanocobalamin ((VITAMIN B-12)) injection 1,000 mcg  . cyanocobalamin ((VITAMIN B-12)) injection 1,000 mcg  . [DISCONTINUED] cyanocobalamin ((VITAMIN B-12)) injection 1,000 mcg    Allergies (verified) Ace inhibitors; Amoxicillin; Codeine; Sulfa antibiotics; Tetracyclines & related; Vibramycin [doxycycline calcium]; and Ciprofloxacin hcl   History: Past Medical History:  Diagnosis Date  . Asthma   . Chronic bronchitis (HCC)   . Diabetes mellitus (HCC)   . GERD (gastroesophageal reflux disease)    hiatal hernia  . Gout   . Hematuria    followed by urology  . History of colon polyps   . History of frequent urinary tract infections   . Humeral fracture 9/09   comminuted impacted proximal  . Hypercholesterolemia   . Hypertension   . Peripheral vascular disease [facility])    Past Surgical History:  Procedure Laterality Date  . BLADDER SUSPENSION  12/08/99  . BREAST BIOPSY Right 1960's  . CHOLECYSTECTOMY  1994  . KNEE SURGERY Right    Family History  Problem Relation Age of Onset  .  Hypertension Father   . CVA Father   . CAD Father   . Hypertension Mother   . Colitis Mother   . CAD Mother   . Breast cancer Neg Hx   . Colon cancer Neg Hx    Social History   Occupational History  . Not on file.   Social History Main Topics  . Smoking status: Never Smoker  . Smokeless tobacco: Never Used  . Alcohol use No  . Drug use: No  . Sexual activity: No    Tobacco Counseling Counseling given: Not Answered   Activities of Daily Living In your present state of  health, do you have any difficulty performing the following activities: 10/26/2016 07/06/2016  Hearing? Y N  Vision? N N  Difficulty concentrating or making decisions? N N  Walking or climbing stairs? Y N  Dressing or bathing? N N  Doing errands, shopping? Y N  Preparing Food and eating ? Y -  Using the Toilet? N -  In the past six months, have you accidently leaked urine? Y -  Do you have problems with loss of bowel control? N -  Managing your Medications? Y -  Managing your Finances? Y -  Housekeeping or managing your Housekeeping? Y -  Some recent data might be hidden    Immunizations and Health Maintenance Immunization History  Administered Date(s) Administered  . Influenza Split 06/22/2012, 03/17/2013  . Influenza, High Dose Seasonal PF 02/05/2016  . Influenza,inj,Quad PF,36+ Mos 05/01/2015  . Influenza-Unspecified 03/04/2014  . Pneumococcal Polysaccharide-23 10/30/2013  . Tdap 08/25/2013   Health Maintenance Due  Topic Date Due  . OPHTHALMOLOGY EXAM  11/05/1936  . DEXA SCAN  11/04/1993  . FOOT EXAM  10/24/2014  . PNA vac Low Risk Adult (2 of 2 - PCV13) 10/31/2014  . URINE MICROALBUMIN  11/21/2015    Patient Care Team: Dale DurhamScott, Charlene, MD as PCP - General (Internal Medicine)  Indicate any recent Medical Services you may have received from other than [facility] providers in the past year (date may be approximate).     Assessment:   This is a routine wellness examination for Diamond Newman. The goal of the wellness visit is to assist the patient how to close the gaps in care and create a preventative care plan for the patient.   Taking calcium VIT D as appropriate/Osteoporosis risk reviewed.  Medications reviewed; taking without issues or barriers.  Safety issues reviewed; smoke detectors in the home. No firearms in the home.  Wears seatbelts when riding with others. Patient does wear sunscreen or protective clothing when in direct sunlight. No violence in the  home.  Depression- PHQ 2 &9 complete.  No signs/symptoms or verbal communication regarding little pleasure in doing things, feeling down, depressed or hopeless. No changes in sleeping, energy, eating, concentrating.  No thoughts of self harm or harm towards others.  Time spent on this topic is 8 minutes.   Patient is alert, normal appearance, oriented to person/place/and time. Correctly identified the president of the [place], recall of 3/3 words, and performing simple calculations.  Patient displays appropriate judgement and can read correct time from watch face.  No new identified risk were noted.  No failures at ADL's or IADL's. Walker in use.  BMI- discussed the importance of a healthy diet, water intake and exercise. Educational material provided.   24 hour diet recall: Breakfast: Bagel, jelly, ensure  Lunch: Chicken salad sandwich, chips, jello Dinner: Rice, green vegetable, fruit Daily fluid intake: 0 cups of caffeine,  6 cups of water  HTN- followed by PCP.  Sleep patterns- Sleeps 6 hours at night. CPAP not in use.  Naps during the day.  B12 level drawn today.  DEXA Scan discussed.  Deferred per patient preference.  TDAP vaccine deferred per patient preference.  Follow up with insurance.  Educational material provided.  Patient Concerns: None at this time. Follow up with PCP as needed.  Hearing/Vision screen Hearing Screening Comments: Followed by [facility] She has hearing aids but does not wear them Vision Screening Comments: Followed by Dr. Clydene Pugh Wears corrective lenses Last OV 2017 Visual acuity not assessed per patient preference since they have regular follow up with the ophthalmologist  Dietary issues and exercise activities discussed: Current Exercise Habits: Home exercise routine, Type of exercise: walking;stretching (home physical therapy.  Standing/chair exercise), Time (Minutes): 30, Frequency (Times/Week): 7, Weekly Exercise (Minutes/Week):  210, Intensity: Mild  Goals    . Healthy Lifestyle          Stay active and continue home therapy exercise, increase as tolerated Stay hydrated Low carb foods      Depression Screen PHQ 2/9 Scores 10/26/2016 07/06/2016 02/05/2016 11/21/2014 10/23/2013 10/30/2012 08/14/2012  PHQ - 2 Score 0 0 0 0 0 0 0    Fall Risk Fall Risk  10/26/2016 07/06/2016 02/05/2016 01/23/2016 11/21/2014  Falls in the past year? No No No Yes Yes  Number falls in past yr: - - - 1 1  Injury with Fall? - - - No No    Cognitive Function: MMSE - Mini Mental State Exam 10/26/2016  Orientation to time 5  Orientation to Place 5  Registration 3  Attention/ Calculation 5  Recall 3  Language- name 2 objects 2  Language- repeat 1  Language- follow 3 step command 3  Language- read & follow direction 1  Write a sentence 1  Copy design 1  Total score 30        Screening Tests Health Maintenance  Topic Date Due  . OPHTHALMOLOGY EXAM  11/05/1936  . DEXA SCAN  11/04/1993  . FOOT EXAM  10/24/2014  . PNA vac Low Risk Adult (2 of 2 - PCV13) 10/31/2014  . URINE MICROALBUMIN  11/21/2015  . MAMMOGRAM  07/31/2035 (Originally 01/21/2016)  . INFLUENZA VACCINE  12/22/2016  . HEMOGLOBIN A1C  03/26/2017  . TETANUS/TDAP  08/26/2023      Plan:    End of life planning; Advance aging; Advanced directives discussed. Copy of current HCPOA/Living Will requested.    I have personally reviewed and noted the following in the patient's chart:   . Medical and social history . Use of alcohol, tobacco or illicit drugs  . Current medications and supplements . Functional ability and status . Nutritional status . Physical activity . Advanced directives . List of other physicians . Hospitalizations, surgeries, and ER visits in previous 12 months . Vitals . Screenings to include cognitive, depression, and falls . Referrals and appointments  In addition, I have reviewed and discussed with patient certain preventive protocols, quality  metrics, and best practice recommendations. A written personalized care plan for preventive services as well as general preventive health recommendations were provided to patient.     OBrien-Blaney, Noell Lorensen L, LPN   11/29/2954    Reviewed above information.  Agree with plan.  Dr Lorin Picket

## 2016-10-26 NOTE — Patient Instructions (Signed)
  Ms. Diamond Newman , Thank you for taking time to come for your Medicare Wellness Visit. I appreciate your ongoing commitment to your health goals. Please review the following plan we discussed and let me know if I can assist you in the future.   Follow up with Dr. Wyvonna PlumScottas needed.    Bring a copy of your Health Care Power of Attorney and/or Living Will to be scanned into chart.  Have a great day!  These are the goals we discussed: Goals    None      This is a list of the screening recommended for you and due dates:  Health Maintenance  Topic Date Due  . Eye exam for diabetics  11[birth year]05/1936  . DEXA scan (bone density measurement)  11/04/1993  . Complete foot exam   10/24/2014  . Pneumonia vaccines (2 of 2 - PCV13) 10/31/2014  . Urine Protein Check  11/21/2015  . Mammogram  07/31/2035*  . Flu Shot  12/22/2016  . Hemoglobin A1C  03/26/2017  . Tetanus Vaccine  08/26/2023  *Topic was postponed. The date shown is not the original due date.

## 2016-10-26 NOTE — Progress Notes (Unsigned)
Vitamin B12 level ordered per Dr. Lorin PicketScott.

## 2016-10-27 LAB — VITAMIN B12: VITAMIN B 12: 572 pg/mL (ref 200–1100)

## 2016-10-28 ENCOUNTER — Other Ambulatory Visit: Payer: Self-pay | Admitting: Internal Medicine

## 2016-10-29 DIAGNOSIS — J45909 Unspecified asthma, uncomplicated: Secondary | ICD-10-CM | POA: Diagnosis not present

## 2016-10-29 DIAGNOSIS — J9621 Acute and chronic respiratory failure with hypoxia: Secondary | ICD-10-CM | POA: Diagnosis not present

## 2016-10-29 DIAGNOSIS — E1122 Type 2 diabetes mellitus with diabetic chronic kidney disease: Secondary | ICD-10-CM | POA: Diagnosis not present

## 2016-10-29 DIAGNOSIS — J441 Chronic obstructive pulmonary disease with (acute) exacerbation: Secondary | ICD-10-CM | POA: Diagnosis not present

## 2016-10-29 DIAGNOSIS — I1310 Hypertensive heart and chronic kidney disease without heart failure, with stage 1 through stage 4 chronic kidney disease, or unspecified chronic kidney disease: Secondary | ICD-10-CM | POA: Diagnosis not present

## 2016-10-29 DIAGNOSIS — E1151 Type 2 diabetes mellitus with diabetic peripheral angiopathy without gangrene: Secondary | ICD-10-CM | POA: Diagnosis not present

## 2016-11-09 DIAGNOSIS — E1122 Type 2 diabetes mellitus with diabetic chronic kidney disease: Secondary | ICD-10-CM | POA: Diagnosis not present

## 2016-11-09 DIAGNOSIS — J45909 Unspecified asthma, uncomplicated: Secondary | ICD-10-CM | POA: Diagnosis not present

## 2016-11-09 DIAGNOSIS — E1151 Type 2 diabetes mellitus with diabetic peripheral angiopathy without gangrene: Secondary | ICD-10-CM | POA: Diagnosis not present

## 2016-11-09 DIAGNOSIS — I1310 Hypertensive heart and chronic kidney disease without heart failure, with stage 1 through stage 4 chronic kidney disease, or unspecified chronic kidney disease: Secondary | ICD-10-CM | POA: Diagnosis not present

## 2016-11-09 DIAGNOSIS — J441 Chronic obstructive pulmonary disease with (acute) exacerbation: Secondary | ICD-10-CM | POA: Diagnosis not present

## 2016-11-09 DIAGNOSIS — J9621 Acute and chronic respiratory failure with hypoxia: Secondary | ICD-10-CM | POA: Diagnosis not present

## 2016-11-10 ENCOUNTER — Telehealth: Payer: Self-pay | Admitting: Internal Medicine

## 2016-11-10 ENCOUNTER — Other Ambulatory Visit: Payer: Self-pay | Admitting: Internal Medicine

## 2016-11-10 NOTE — Telephone Encounter (Signed)
DG barium swallow test cancelled per patient request.

## 2016-11-10 NOTE — Telephone Encounter (Signed)
Pt aware she says she is not able to go today. She will go tomorrow.

## 2016-11-10 NOTE — Telephone Encounter (Signed)
Pt daughter calling stating she does not want to do this test that Dr Belia HemanKasa ordered for her to do on Friday   Please advise

## 2016-11-12 ENCOUNTER — Ambulatory Visit: Payer: Medicare Other

## 2016-11-23 ENCOUNTER — Other Ambulatory Visit: Payer: Self-pay | Admitting: Internal Medicine

## 2016-11-30 ENCOUNTER — Telehealth: Payer: Self-pay | Admitting: Internal Medicine

## 2016-11-30 ENCOUNTER — Ambulatory Visit (INDEPENDENT_AMBULATORY_CARE_PROVIDER_SITE_OTHER): Payer: Medicare Other

## 2016-11-30 DIAGNOSIS — E538 Deficiency of other specified B group vitamins: Secondary | ICD-10-CM

## 2016-11-30 MED ORDER — CYANOCOBALAMIN 1000 MCG/ML IJ SOLN
1000.0000 ug | Freq: Once | INTRAMUSCULAR | Status: AC
  Administered 2016-11-30: 1000 ug via INTRAMUSCULAR

## 2016-11-30 NOTE — Telephone Encounter (Signed)
Pt dropped off a Handicap form to be signed by Dr. Lorin PicketScott. Paper is up front in Dr. Roby LoftsScott's color folder.

## 2016-11-30 NOTE — Telephone Encounter (Signed)
Dr. Lorin PicketScott signed form while pt was here. Pt has signed paper.

## 2016-11-30 NOTE — Progress Notes (Addendum)
Patient came in for B12 injection, received in left deltoid.  Patient tolerated well.   Reviewed.  Dr Lorin PicketScott

## 2016-12-16 ENCOUNTER — Other Ambulatory Visit: Payer: Self-pay | Admitting: Internal Medicine

## 2016-12-31 ENCOUNTER — Ambulatory Visit (INDEPENDENT_AMBULATORY_CARE_PROVIDER_SITE_OTHER): Payer: Medicare Other | Admitting: Internal Medicine

## 2016-12-31 ENCOUNTER — Encounter: Payer: Self-pay | Admitting: Internal Medicine

## 2016-12-31 VITALS — BP 124/62 | HR 91 | Temp 98.6°F | Resp 14 | Ht 66.0 in | Wt 194.0 lb

## 2016-12-31 DIAGNOSIS — K219 Gastro-esophageal reflux disease without esophagitis: Secondary | ICD-10-CM

## 2016-12-31 DIAGNOSIS — J449 Chronic obstructive pulmonary disease, unspecified: Secondary | ICD-10-CM

## 2016-12-31 DIAGNOSIS — G4730 Sleep apnea, unspecified: Secondary | ICD-10-CM

## 2016-12-31 DIAGNOSIS — E538 Deficiency of other specified B group vitamins: Secondary | ICD-10-CM

## 2016-12-31 DIAGNOSIS — E559 Vitamin D deficiency, unspecified: Secondary | ICD-10-CM

## 2016-12-31 DIAGNOSIS — E1151 Type 2 diabetes mellitus with diabetic peripheral angiopathy without gangrene: Secondary | ICD-10-CM | POA: Diagnosis not present

## 2016-12-31 DIAGNOSIS — N183 Chronic kidney disease, stage 3 unspecified: Secondary | ICD-10-CM

## 2016-12-31 DIAGNOSIS — E7800 Pure hypercholesterolemia, unspecified: Secondary | ICD-10-CM | POA: Diagnosis not present

## 2016-12-31 DIAGNOSIS — I739 Peripheral vascular disease, unspecified: Secondary | ICD-10-CM | POA: Diagnosis not present

## 2016-12-31 DIAGNOSIS — I10 Essential (primary) hypertension: Secondary | ICD-10-CM | POA: Diagnosis not present

## 2016-12-31 DIAGNOSIS — E669 Obesity, unspecified: Secondary | ICD-10-CM

## 2016-12-31 DIAGNOSIS — D72829 Elevated white blood cell count, unspecified: Secondary | ICD-10-CM

## 2016-12-31 LAB — HEPATIC FUNCTION PANEL
ALBUMIN: 3.8 g/dL (ref 3.5–5.2)
ALT: 12 U/L (ref 0–35)
AST: 18 U/L (ref 0–37)
Alkaline Phosphatase: 51 U/L (ref 39–117)
Bilirubin, Direct: 0 mg/dL (ref 0.0–0.3)
TOTAL PROTEIN: 6.7 g/dL (ref 6.0–8.3)
Total Bilirubin: 0.5 mg/dL (ref 0.2–1.2)

## 2016-12-31 LAB — BASIC METABOLIC PANEL
BUN: 33 mg/dL — AB (ref 6–23)
CO2: 30 mEq/L (ref 19–32)
CREATININE: 1.39 mg/dL — AB (ref 0.40–1.20)
Calcium: 9.4 mg/dL (ref 8.4–10.5)
Chloride: 104 mEq/L (ref 96–112)
GFR: 38.02 mL/min — AB (ref >60.00)
GLUCOSE: 138 mg/dL — AB (ref 70–99)
Potassium: 4.6 mEq/L (ref 3.5–5.1)
Sodium: 139 mEq/L (ref 135–145)

## 2016-12-31 LAB — LIPID PANEL
Cholesterol: 127 mg/dL (ref 0–200)
HDL: 44 mg/dL (ref >39.00)
LDL Cholesterol: 56 mg/dL (ref 0–99)
NonHDL: 82.83
Total CHOL/HDL Ratio: 3
Triglycerides: 133 mg/dL (ref 0.0–149.0)
VLDL: 26.6 mg/dL (ref 0.0–40.0)

## 2016-12-31 LAB — CBC WITH DIFFERENTIAL/PLATELET
BASOS PCT: 0.5 % (ref 0.0–3.0)
Basophils Absolute: 0 10*3/uL (ref 0.0–0.1)
Eosinophils Absolute: 0.3 10*3/uL (ref 0.0–0.7)
Eosinophils Relative: 3.1 % (ref 0.0–5.0)
HCT: 36.2 % (ref 36.0–46.0)
Hemoglobin: 11.6 g/dL — ABNORMAL LOW (ref 12.0–15.0)
LYMPHS ABS: 2.4 10*3/uL (ref 0.7–4.0)
Lymphocytes Relative: 24.1 % (ref 12.0–46.0)
MCHC: 31.9 g/dL (ref 30.0–36.0)
MCV: 93.3 fl (ref 78.0–100.0)
MONOS PCT: 8.7 % (ref 3.0–12.0)
Monocytes Absolute: 0.9 10*3/uL (ref 0.1–1.0)
NEUTROS ABS: 6.3 10*3/uL (ref 1.4–7.7)
NEUTROS PCT: 63.6 % (ref 43.0–77.0)
Platelets: 253 10*3/uL (ref 150.0–400.0)
RBC: 3.88 Mil/uL (ref 3.87–5.11)
RDW: 17 % — ABNORMAL HIGH (ref 11.5–15.5)
WBC: 9.9 10*3/uL (ref 4.0–10.5)

## 2016-12-31 LAB — HEMOGLOBIN A1C: Hgb A1c MFr Bld: 6.9 % — ABNORMAL HIGH (ref 4.6–6.5)

## 2016-12-31 LAB — MICROALBUMIN / CREATININE URINE RATIO
Creatinine,U: 142.2 mg/dL
MICROALB UR: 10.1 mg/dL — AB (ref 0.0–1.9)
Microalb Creat Ratio: 7.1 mg/g (ref 0.0–30.0)

## 2016-12-31 LAB — HM DIABETES FOOT EXAM

## 2016-12-31 LAB — TSH: TSH: 2.69 u[IU]/mL (ref 0.35–4.50)

## 2016-12-31 MED ORDER — CYANOCOBALAMIN 1000 MCG/ML IJ SOLN
1000.0000 ug | Freq: Once | INTRAMUSCULAR | Status: AC
  Administered 2016-12-31: 1000 ug via INTRAMUSCULAR

## 2016-12-31 NOTE — Progress Notes (Signed)
Pre-visit discussion using our clinic review tool. No additional management support is needed unless otherwise documented below in the visit note.  

## 2016-12-31 NOTE — Progress Notes (Signed)
[name], female   DOB: March 17, 1927, 83 y.o.   MRN: [id number]   Subjective:    [name], female    DOB: Aug 21, 1926, 83 y.o.   MRN: [id number]  HPI  Patient here for a scheduled follow up.  She is accompanied by her daughter.  History obtained from both of them.  She saw Dr Mortimer Fries 09/2016.  Spiriva was added.  She stopped.  Caused increase coughing.  Using advair and rescue inhaler.  Feels her breathing is overall stable.  No chest pain.  No acid reflux.  No abdominal pain.  Bowels moving.  Discussed importance of getting up walking and exercising.  She is not as active.  Discussed diet and exercise.  Discussed the need to check her sugars.  Weight has increased.    Past Medical History:  Diagnosis Date  . Asthma   . Chronic bronchitis ([place])   . Diabetes mellitus ([place])   . GERD (gastroesophageal reflux disease)    hiatal hernia  . Gout   . Hematuria    followed by urology  . History of colon polyps   . History of frequent urinary tract infections   . Humeral fracture 9/09   comminuted impacted proximal  . Hypercholesterolemia   . Hypertension   . Peripheral vascular disease [facility])    Past Surgical History:  Procedure Laterality Date  . BLADDER SUSPENSION  12/08/99  . BREAST BIOPSY Right 1960's  . CHOLECYSTECTOMY  1994  . KNEE SURGERY Right    Family History  Problem Relation Age of Onset  . Hypertension Father   . CVA Father   . CAD Father   . Hypertension Mother   . Colitis Mother   . CAD Mother   . Breast cancer Neg Hx   . Colon cancer Neg Hx    Social History   Social History  . Marital status: Widowed    Spouse name: N/A  . Number of children: 2  . Years of education: N/A   Social History Main Topics  . Smoking status: Never Smoker  . Smokeless tobacco: Never Used  . Alcohol use No  . Drug use: No  . Sexual activity: No   Other Topics Concern  . None   Social History Narrative  . None    Outpatient Encounter Prescriptions  as of 12/31/2016  Medication Sig  . ADVAIR DISKUS 250-50 MCG/DOSE AEPB Inhale 1 puff into the lungs 2 (two) times daily.  Marland Kitchen allopurinol (ZYLOPRIM) 100 MG tablet Take 1 tablet (100 mg total) by mouth daily.  . Calcium Carbonate-Vitamin D (CALCIUM 600+D) 600-400 MG-UNIT per tablet Take 1 tablet by mouth daily.   . cetirizine (ZYRTEC) 10 MG tablet Take 10 mg by mouth daily.  . Cholecalciferol (VITAMIN D-3) 1000 UNITS CAPS Take 1 capsule by mouth daily.  . Cranberry-Cholecalciferol 4200-500 MG-UNIT CAPS Take by mouth.  . feeding supplement, ENSURE ENLIVE, (ENSURE ENLIVE) LIQD Take 237 mLs by mouth 2 (two) times daily between meals.  Marland Kitchen FLUoxetine (PROZAC) 20 MG capsule Take 1 capsule (20 mg total) by mouth daily.  Marland Kitchen levothyroxine (SYNTHROID, LEVOTHROID) 50 MCG tablet Take 1 tablet (50 mcg total) by mouth daily.  . methenamine (HIPREX) 1 g tablet Take 1 tablet (1 g total) by mouth daily.  . montelukast (SINGULAIR) 10 MG tablet Take 1 tablet (10 mg total) by mouth at bedtime.  Marland Kitchen nystatin (NYSTATIN) powder Apply topically 2 (two) times daily.  Marland Kitchen nystatin cream (MYCOSTATIN) Apply 1  application topically 2 (two) times daily.  . pantoprazole (PROTONIX) 40 MG tablet Take 1 tablet (40 mg total) by mouth daily.  . simvastatin (ZOCOR) 20 MG tablet Take 1 tablet (20 mg total) by mouth daily.  Marland Kitchen tolterodine (DETROL) 2 MG tablet Take 1 tablet (2 mg total) by mouth 2 (two) times daily.  . VENTOLIN HFA 108 (90 Base) MCG/ACT inhaler Inhale 2 puffs into the lungs every 6 (six) hours as needed for wheezi ng or shortness of breath.  . [DISCONTINUED] Tiotropium Bromide Monohydrate (SPIRIVA RESPIMAT) 1.25 MCG/ACT AERS Inhale 2 puffs into the lungs daily.  . [EXPIRED] cyanocobalamin ((VITAMIN B-12)) injection 1,000 mcg    No facility-administered encounter medications on file as of 12/31/2016.     Review of Systems  Constitutional: Negative for appetite change.       Eating sandwiches and chips.  Not watching her  diet.  Increased weight.    HENT: Negative for congestion and sinus pressure.   Respiratory: Negative for chest tightness.        Breathing stable.  Cough stable.    Cardiovascular: Negative for chest pain, palpitations and leg swelling.  Gastrointestinal: Negative for abdominal pain, diarrhea, nausea and vomiting.  Genitourinary: Negative for difficulty urinating and dysuria.  Musculoskeletal: Negative for joint swelling and myalgias.  Skin: Negative for color change and rash.  Neurological: Negative for dizziness, light-headedness and headaches.  Psychiatric/Behavioral: Negative for agitation and dysphoric mood.       Objective:     Blood pressure rechecked by me:  118/70  Physical Exam  Constitutional: She appears well-developed and well-nourished. No distress.  HENT:  Nose: Nose normal.  Mouth/Throat: Oropharynx is clear and moist.  Neck: Neck supple. No thyromegaly present.  Cardiovascular: Normal rate and regular rhythm.   Pulmonary/Chest: Breath sounds normal. No respiratory distress.  Abdominal: Soft. Bowel sounds are normal. There is no tenderness.  Musculoskeletal: She exhibits no edema or tenderness.  Feet:  Decreased sensation to pin prick toes.  Sensation improved as move up foot to ankle.    Lymphadenopathy:    She has no cervical adenopathy.  Skin: No rash noted. No erythema.  Psychiatric: She has a normal mood and affect. Her behavior is normal.    BP 124/62 (BP Location: Left Arm, Patient Position: Sitting, Cuff Size: Large)   Pulse 91   Temp 98.6 F (37 C) (Oral)   Resp 14   Ht _0  (1.676 m)   Wt 194 lb (88 kg)   SpO2 95%   BMI 31.31 kg/m  Wt Readings from Last 3 Encounters:  12/31/16 194 lb (88 kg)  10/26/16 188 lb 6.4 oz (85.5 kg)  10/07/16 186 lb 9.6 oz (84.6 kg)     Lab Results  Component Value Date   WBC 9.9 12/31/2016   HGB 11.6 (L) 12/31/2016   HCT 36.2 12/31/2016   PLT 253.0 12/31/2016   GLUCOSE 138 (H) 12/31/2016   CHOL 127  12/31/2016   TRIG 133.0 12/31/2016   HDL 44.00 12/31/2016   LDLCALC 56 12/31/2016   ALT 12 12/31/2016   AST 18 12/31/2016   NA 139 12/31/2016   K 4.6 12/31/2016   CL 104 12/31/2016   CREATININE 1.39 (H) 12/31/2016   BUN 33 (H) 12/31/2016   CO2 30 12/31/2016   TSH 2.69 12/31/2016   HGBA1C 6.9 (H) 12/31/2016   MICROALBUR 10.1 (H) 12/31/2016    Dg Chest 2 View  Result Date: 07/06/2016 CLINICAL DATA:  Cough, congestion and shortness  of breath. EXAM: CHEST  2 VIEW COMPARISON:  12/30/2014 FINDINGS: The cardiac silhouette, mediastinal and hilar contours are normal and stable. Stable tortuosity and calcification of the thoracic aorta. Chronic bronchitic type interstitial lung changes but no infiltrates, edema or effusions. The bony thorax is intact. IMPRESSION: Chronic lung changes.  No acute overlying pulmonary process. Electronically Signed   By: Marijo Sanes M.D.   On: 07/06/2016 13:05       Assessment & Plan:   Problem List Items Addressed This Visit    Chronic obstructive airway disease with asthma ([place])    Just evaluated by Dr Mortimer Fries 09/2016.  Felt spiriva made her cough.  Has stopped.  Feels breathing stable.  Follow.        CKD (chronic kidney disease) stage 3, GFR 30-59 ml/min    Avoid antiinflammatories.  Follow met b.        Diabetes mellitus with peripheral vascular disease ([place])    Discussed low carb diet and exercise.  Discussed the need to not continue to gain weight.  Glucometer given.  Instructed on how to check sugars.  Check and record sugars.  Follow met b and a1c.        Relevant Orders   Hemoglobin A1c (Completed)   Basic metabolic panel (Completed)   Microalbumin / creatinine urine ratio (Completed)   GERD (gastroesophageal reflux disease)    Controlled on protonix.        Hypercholesterolemia    On simvastatin.  Low cholesterol diet and exercise.  Follow lipid panel and liver function tests.        Relevant Orders   Hepatic function panel (Completed)     Lipid panel (Completed)   Hypertension    Blood pressure under good control.  Continue same medication regimen.  Follow pressures.  Follow metabolic panel.        Relevant Orders   TSH (Completed)   Leukocytosis - Primary    Recheck cbc.        Relevant Orders   CBC with Differential/Platelet (Completed)   Obesity (BMI 30.0-34.9)    Discussed diet and exercise.  Follow.        Peripheral vascular disease (HCC)    Varying pressures in her arms.  Unchanged.  No pain.  Continue risk factor modification.       Sleep apnea    Tried cpap.  Did not tolerated.  Declines to retry.        Vitamin B12 deficiency    Continue b12 injections.        Relevant Medications   cyanocobalamin ((VITAMIN B-12)) injection 1,000 mcg (Completed)   Vitamin D deficiency    Continue supplements.  Follow vitamin D level.            Einar Pheasant, MD

## 2017-01-02 ENCOUNTER — Encounter: Payer: Self-pay | Admitting: Internal Medicine

## 2017-01-02 NOTE — Assessment & Plan Note (Signed)
Controlled on protonix.   

## 2017-01-02 NOTE — Assessment & Plan Note (Signed)
Varying pressures in her arms.  Unchanged.  No pain.  Continue risk factor modification.

## 2017-01-02 NOTE — Assessment & Plan Note (Signed)
Blood pressure under good control.  Continue same medication regimen.  Follow pressures.  Follow metabolic panel.   

## 2017-01-02 NOTE — Assessment & Plan Note (Signed)
Tried cpap.  Did not tolerated.  Declines to retry.

## 2017-01-02 NOTE — Assessment & Plan Note (Signed)
Discussed diet and exercise.  Follow.  

## 2017-01-02 NOTE — Assessment & Plan Note (Signed)
On simvastatin.  Low cholesterol diet and exercise.  Follow lipid panel and liver function tests.   

## 2017-01-02 NOTE — Assessment & Plan Note (Signed)
Discussed low carb diet and exercise.  Discussed the need to not continue to gain weight.  Glucometer given.  Instructed on how to check sugars.  Check and record sugars.  Follow met b and a1c.

## 2017-01-02 NOTE — Assessment & Plan Note (Signed)
Just evaluated by Dr Belia HemanKasa 09/2016.  Felt spiriva made her cough.  Has stopped.  Feels breathing stable.  Follow.

## 2017-01-02 NOTE — Assessment & Plan Note (Signed)
Continue supplements.  Follow vitamin D level.    

## 2017-01-02 NOTE — Assessment & Plan Note (Signed)
Continue b12 injections.  

## 2017-01-02 NOTE — Assessment & Plan Note (Signed)
Recheck cbc.  

## 2017-01-02 NOTE — Assessment & Plan Note (Signed)
Avoid antiinflammatories.  Follow met b.

## 2017-01-26 ENCOUNTER — Other Ambulatory Visit: Payer: Self-pay | Admitting: Internal Medicine

## 2017-02-02 ENCOUNTER — Ambulatory Visit (INDEPENDENT_AMBULATORY_CARE_PROVIDER_SITE_OTHER): Payer: Medicare Other | Admitting: *Deleted

## 2017-02-02 DIAGNOSIS — E538 Deficiency of other specified B group vitamins: Secondary | ICD-10-CM | POA: Diagnosis not present

## 2017-02-02 MED ORDER — CYANOCOBALAMIN 1000 MCG/ML IJ SOLN
1000.0000 ug | Freq: Once | INTRAMUSCULAR | Status: AC
  Administered 2017-02-02: 1000 ug via INTRAMUSCULAR

## 2017-02-02 NOTE — Progress Notes (Addendum)
Patient presented for B 12 injection to left deltoid, patient voiced no concerns nor showed any signs of distress during injection   I have reviewed the above information and agree with above.   Teresa Tullo, MD 

## 2017-02-08 ENCOUNTER — Other Ambulatory Visit: Payer: Self-pay | Admitting: Internal Medicine

## 2017-02-08 NOTE — Telephone Encounter (Signed)
Last o/v 12/31/16 Next o/v 03/08/17 Last refill 01/07/17

## 2017-02-09 NOTE — Telephone Encounter (Signed)
ok'd rx for methenamine #30 with 2 refills.

## 2017-02-14 ENCOUNTER — Other Ambulatory Visit: Payer: Self-pay | Admitting: Internal Medicine

## 2017-02-22 ENCOUNTER — Other Ambulatory Visit: Payer: Self-pay | Admitting: Internal Medicine

## 2017-02-23 ENCOUNTER — Emergency Department: Payer: Medicare Other

## 2017-02-23 ENCOUNTER — Emergency Department
Admission: EM | Admit: 2017-02-23 | Discharge: 2017-02-23 | Disposition: A | Discharge status: Home / Self Care | Payer: Medicare Other | Attending: Emergency Medicine | Admitting: Emergency Medicine

## 2017-02-23 DIAGNOSIS — I129 Hypertensive chronic kidney disease with stage 1 through stage 4 chronic kidney disease, or unspecified chronic kidney disease: Secondary | ICD-10-CM | POA: Insufficient documentation

## 2017-02-23 DIAGNOSIS — S59911A Unspecified injury of right forearm, initial encounter: Secondary | ICD-10-CM | POA: Diagnosis present

## 2017-02-23 DIAGNOSIS — Y929 Unspecified place or not applicable: Secondary | ICD-10-CM | POA: Insufficient documentation

## 2017-02-23 DIAGNOSIS — Z79899 Other long term (current) drug therapy: Secondary | ICD-10-CM | POA: Diagnosis not present

## 2017-02-23 DIAGNOSIS — E1122 Type 2 diabetes mellitus with diabetic chronic kidney disease: Secondary | ICD-10-CM | POA: Insufficient documentation

## 2017-02-23 DIAGNOSIS — W010XXA Fall on same level from slipping, tripping and stumbling without subsequent striking against object, initial encounter: Secondary | ICD-10-CM | POA: Insufficient documentation

## 2017-02-23 DIAGNOSIS — N183 Chronic kidney disease, stage 3 (moderate): Secondary | ICD-10-CM | POA: Diagnosis not present

## 2017-02-23 DIAGNOSIS — Y999 Unspecified external cause status: Secondary | ICD-10-CM | POA: Diagnosis not present

## 2017-02-23 DIAGNOSIS — S52501A Unspecified fracture of the lower end of right radius, initial encounter for closed fracture: Secondary | ICD-10-CM | POA: Diagnosis not present

## 2017-02-23 DIAGNOSIS — J45909 Unspecified asthma, uncomplicated: Secondary | ICD-10-CM | POA: Diagnosis not present

## 2017-02-23 DIAGNOSIS — M25531 Pain in right wrist: Secondary | ICD-10-CM | POA: Diagnosis not present

## 2017-02-23 DIAGNOSIS — Y9301 Activity, walking, marching and hiking: Secondary | ICD-10-CM | POA: Insufficient documentation

## 2017-02-23 DIAGNOSIS — W1830XA Fall on same level, unspecified, initial encounter: Secondary | ICD-10-CM | POA: Diagnosis not present

## 2017-02-23 DIAGNOSIS — S52601A Unspecified fracture of lower end of right ulna, initial encounter for closed fracture: Secondary | ICD-10-CM | POA: Insufficient documentation

## 2017-02-23 NOTE — ED Triage Notes (Signed)
Pt to the er via ems for injury sustained in a fall this morning. Pt fell backwards and hit her right wrist on the wall. Pt was able to get herself out of the floor prior to EMS arrival. NO LOC. Swelling to right wrist. Able to move fingers.

## 2017-02-23 NOTE — ED Provider Notes (Addendum)
[facility] Emergency Department Provider Note  ____________________________________________   First MD Initiated Contact with Patient 02/23/17 [phone]     (approximate)  I have reviewed the triage vital signs and the nursing notes.   HISTORY  Chief Complaint Fall   HPI Diamond Newman is a 83 y.o. female with a history of hypertension and diabetes who walks her baseline with a walker who is presenting to the emergency department today with a fall onto her right wrist. She says that she was walking without her walker this morning when she lost her footing and fell forward onto her right wrist as she was trying to break her fall. She denies hitting her head orlosing consciousness.she says that her right thumb feels slightly numb but that she is having only minimal pain at rest at this time. She has home health from [address] and her son helps at home most nights.   Past Medical History:  Diagnosis Date  . Asthma   . Chronic bronchitis (HCC)   . Diabetes mellitus (HCC)   . GERD (gastroesophageal reflux disease)    hiatal hernia  . Gout   . Hematuria    followed by urology  . History of colon polyps   . History of frequent urinary tract infections   . Humeral fracture 9/09   comminuted impacted proximal  . Hypercholesterolemia   . Hypertension   . Peripheral vascular disease [facility])     Patient Active Problem List   Diagnosis Date Noted  . Sleep apnea 09/27/2016  . Bronchitis 07/06/2016  . Acute respiratory failure with hypoxia (HCC) 07/06/2016  . Daytime somnolence 08/03/2015  . Fall against object 12/30/2014  . CKD (chronic kidney disease) stage 3, GFR 30-59 ml/min (HCC) 11/24/2014  . Abnormal mammogram 01/25/2014  . Muscular deconditioning 12/03/2013  . Leukocytosis 11/27/2013  . Obesity (BMI 30.0-34.9) 10/28/2013  . Chronic obstructive airway disease with asthma (HCC) 08/14/2013  . Lower extremity edema 08/05/2013  . Cough 04/22/2013  . Disorder of  kidney and ureter 07/28/2012  . Urinary urgency 07/28/2012  . Vitamin B12 deficiency 07/18/2012  . Osteopenia 05/27/2012  . Vitamin D deficiency 05/27/2012  . Hypertension 05/25/2012  . Hypercholesterolemia 05/25/2012  . Peripheral vascular disease (HCC) 05/25/2012  . History of frequent urinary tract infections 05/25/2012  . GERD (gastroesophageal reflux disease) 05/25/2012  . Diabetes mellitus with peripheral vascular disease (HCC) 05/25/2012  . Gout 05/25/2012  . Incontinence of feces 06/03/2011  . Mixed urge and stress incontinence 03/22/2011  . Atrophic vaginitis 03/22/2011    Past Surgical History:  Procedure Laterality Date  . BLADDER SUSPENSION  12/08/99  . BREAST BIOPSY Right 1960's  . CHOLECYSTECTOMY  1994  . KNEE SURGERY Right     Prior to Admission medications   Medication Sig Start Date End Date Taking? Authorizing Provider  ADVAIR DISKUS 250-50 MCG/DOSE AEPB Inhale 1 puff into the lungs 2 (two) times daily. 12/16/16   Dale Gilman, MD  allopurinol (ZYLOPRIM) 100 MG tablet Take 1 tablet (100 mg total) by mouth daily. 01/26/17   Dale Collyer, MD  Calcium Carbonate-Vitamin D (CALCIUM 600+D) 600-400 MG-UNIT per tablet Take 1 tablet by mouth daily.     [provider]  cetirizine (ZYRTEC) 10 MG tablet Take 10 mg by mouth daily.    [provider]  Cholecalciferol (VITAMIN D-3) 1000 UNITS CAPS Take 1 capsule by mouth daily.    [provider]  Cranberry-Cholecalciferol 4200-500 MG-UNIT CAPS Take by mouth.  [provider]  feeding supplement, ENSURE ENLIVE, (ENSURE ENLIVE) LIQD Take 237 mLs by mouth 2 (two) times daily between meals. 07/09/16   Ramonita Lab, MD  FLUoxetine (PROZAC) 20 MG capsule Take 1 capsule (20 mg total) by mouth daily. 02/15/17   Dale [place], MD  levothyroxine (SYNTHROID, LEVOTHROID) 50 MCG tablet Take 1 tablet (50 mcg total) by mouth daily. 01/26/17   Dale [place], MD  methenamine (HIPREX) 1 g tablet Take 1  tablet (1 g total) by mouth daily. 02/09/17   Dale [place], MD  montelukast (SINGULAIR) 10 MG tablet Take 1 tablet (10 mg total) by mouth at bedtime. 09/13/16   Dale [place], MD  nystatin (NYSTATIN) powder Apply topically 2 (two) times daily. 09/23/16   Dale [place], MD  nystatin cream (MYCOSTATIN) Apply 1 application topically 2 (two) times daily. 09/23/16   Dale [place], MD  pantoprazole (PROTONIX) 40 MG tablet Take 1 tablet (40 mg total) by mouth daily. 02/15/17   Dale [place], MD  simvastatin (ZOCOR) 20 MG tablet Take 1 tablet (20 mg total) by mouth daily. 09/13/16   Dale [place], MD  tolterodine (DETROL) 2 MG tablet Take 1 tablet (2 mg total) by mouth 2 (two) times daily. 02/22/17   Dale [place], MD  VENTOLIN HFA 108 (90 Base) MCG/ACT inhaler Inhale 2 puffs into the lungs every 6 (six) hours as needed for wheezi ng or shortness of breath. 07/26/16   Dale Johnson, MD    Allergies Ace inhibitors; Amoxicillin; Codeine; Sulfa antibiotics; Tetracyclines & related; Vibramycin [doxycycline calcium]; and Ciprofloxacin hcl  Family History  Problem Relation Age of Onset  . Hypertension Father   . CVA Father   . CAD Father   . Hypertension Mother   . Colitis Mother   . CAD Mother   . Breast cancer Neg Hx   . Colon cancer Neg Hx     Social History Social History  Substance Use Topics  . Smoking status: Never Smoker  . Smokeless tobacco: Never Used  . Alcohol use No    Review of Systems  Constitutional: No fever/chills Eyes: No visual changes. ENT: No sore throat. Cardiovascular: Denies chest pain. Respiratory: Denies shortness of breath. Gastrointestinal: No abdominal pain.  No nausea, no vomiting.  No diarrhea.  No constipation. Genitourinary: Negative for dysuria. Musculoskeletal: Negative for back pain. Skin: Negative for rash. Neurological: Negative for headaches, focal weakness or numbness.   ____________________________________________   PHYSICAL  EXAM:  VITAL SIGNS: ED Triage Vitals  Enc Vitals Group     BP 02/23/17 0630 (!) 144/105     Pulse Rate 02/23/17 0630 94     Resp 02/23/17 0630 16     Temp 02/23/17 0630 98.5 F (36.9 C)     Temp Source 02/23/17 0630 Oral     SpO2 02/23/17 0630 97 %     Weight 02/23/17 0631 180 lb (81.6 kg)     Height 02/23/17 0631  (1.727 m)     Head Circumference --      Peak Flow --      Pain Score 02/23/17 0629 5     Pain Loc --      Pain Edu? --      Excl. in GC? --     Constitutional: Alert and oriented. Well appearing and in no acute distress. Eyes: Conjunctivae are normal.  Head: Atraumatic. Nose: No congestion/rhinnorhea. Mouth/Throat: Mucous membranes are moist.  Neck: No stridor.  no tenderness to palpation to the midline cervical spine. No  deformity or step-off. Cardiovascular: Normal rate, regular rhythm. Grossly normal heart sounds.  Good peripheral circulation. Respiratory: Normal respiratory effort.  No retractions. Lungs CTAB. Gastrointestinal: Soft and nontender. No distention.  Musculoskeletal:  Swelling to the dorsum of the right wrist. Mild tenderness to palpation over the dorsum of the right wrist. No ecchymosis. Radial pulses present and there is a brisk capillary refill to the nailbeds. Sensation is intact to light touch to all fingers on the right hand including the thumb. There is equal movement of all range of motion of all the fingers to active motion.  No lower extremity tenderness nor edema.  No joint effusions. Neurologic:  Normal speech and language. No gross focal neurologic deficits are appreciated. Skin:  Skin is warm, dry and intact. No rash noted. Psychiatric: Mood and affect are normal. Speech and behavior are normal.  ____________________________________________   LABS (all labs ordered are listed, but only abnormal results are displayed)  Labs Reviewed - No data to  display ____________________________________________  EKG   ____________________________________________  RADIOLOGY   IMPRESSION: Comminuted fractures of the distal right radial metaphysis with dorsal angulation of the distal fracture fragments. Possible nondisplaced fracture of the base of the ulnar styloid process.   Electronically Signed By: Burman Nieves M.D. On: 02/23/2017 06:58            ____________________________________________   PROCEDURES  Procedure(s) performed:   Procedures  Critical Care performed:   ____________________________________________   INITIAL IMPRESSION / ASSESSMENT AND PLAN / ED COURSE  Pertinent labs & imaging results that were available during my care of the patient were reviewed by me and considered in my medical decision making (see chart for details).  ----------------------------------------- 8:00 AM on 02/23/2017 -----------------------------------------  I discussed the case with Dr. Joice Lofts, the orthopedist on call. He agrees that this fracture does not require a reduction. I discussed this with the patient who is now splinted with a sugar tong splint. She says that she is comfortable in the splint and that her wrist actually feels better with stabilization. She says the splint does not feel too tight. She is able to range her fingers. She has sensation to all fingers to light touch. She has brisk capillary refill. She is aware there may be asymmetry between the right and left wrists secondary to having this fracture. She'll be following up in the office with orthopedics. She is calm that that she will be okay going home because she already has supports at home to help her with her activities of daily living. I also spent a plan to the son. They're understanding of the plan one to comply.      ____________________________________________   FINAL CLINICAL IMPRESSION(S) / ED DIAGNOSES  distal radius and ulnar  fractures.    NEW MEDICATIONS STARTED DURING THIS VISIT:  New Prescriptions   No medications on file     Note:  This document was prepared using Dragon voice recognition software and may include unintentional dictation errors.     Schaevitz, Myra Rude, MD 02/23/17 [phone]  patient without any distress at this time. We discussed using Tylenol as well as other NSAIDs such as Aleve for pain relief. I believe that this patient's age and with her ambulatory baseline being with a walker that a more powerful medication may be detrimental and cause more harm than good.   Myrna Blazer, MD 02/23/17 [phone]

## 2017-02-23 NOTE — ED Notes (Signed)
Family at bedside. 

## 2017-02-23 NOTE — ED Notes (Addendum)
Ice pack applied. Pt c/o tenderness to the upper back between the shoulders where her back hit the wall. Pt has an abrasion in the shape of a triangle. No bleeding noted. Pt has swelling to the right wrist. Good cap refill. Denies head injury. No obvious deformity

## 2017-02-28 DIAGNOSIS — H353131 Nonexudative age-related macular degeneration, bilateral, early dry stage: Secondary | ICD-10-CM | POA: Diagnosis not present

## 2017-03-01 DIAGNOSIS — S52531A Colles' fracture of right radius, initial encounter for closed fracture: Secondary | ICD-10-CM | POA: Diagnosis not present

## 2017-03-01 DIAGNOSIS — S52614A Nondisplaced fracture of right ulna styloid process, initial encounter for closed fracture: Secondary | ICD-10-CM | POA: Diagnosis not present

## 2017-03-01 DIAGNOSIS — M25531 Pain in right wrist: Secondary | ICD-10-CM | POA: Diagnosis not present

## 2017-03-08 ENCOUNTER — Other Ambulatory Visit: Payer: Self-pay | Admitting: Internal Medicine

## 2017-03-08 ENCOUNTER — Ambulatory Visit (INDEPENDENT_AMBULATORY_CARE_PROVIDER_SITE_OTHER): Payer: Medicare Other | Admitting: *Deleted

## 2017-03-08 DIAGNOSIS — Z23 Encounter for immunization: Secondary | ICD-10-CM

## 2017-03-08 DIAGNOSIS — E538 Deficiency of other specified B group vitamins: Secondary | ICD-10-CM | POA: Diagnosis not present

## 2017-03-08 DIAGNOSIS — S52614D Nondisplaced fracture of right ulna styloid process, subsequent encounter for closed fracture with routine healing: Secondary | ICD-10-CM | POA: Diagnosis not present

## 2017-03-08 MED ORDER — CYANOCOBALAMIN 1000 MCG/ML IJ SOLN
1000.0000 ug | Freq: Once | INTRAMUSCULAR | Status: AC
  Administered 2017-03-08: 1000 ug via INTRAMUSCULAR

## 2017-03-08 MED ORDER — NYSTATIN 100000 UNIT/GM EX CREA: 1.0000 "application " | TOPICAL_CREAM | Freq: Two times a day (BID) | CUTANEOUS | 0 refills | Status: DC

## 2017-03-08 MED ORDER — NYSTATIN 100000 UNIT/GM EX POWD: Freq: Two times a day (BID) | CUTANEOUS | 0 refills | Status: DC

## 2017-03-08 NOTE — Progress Notes (Addendum)
Patient presented for B 12 injection to right deltoid, patient voiced no concerns nor showed any signs of distress during injection.  Reviewed.  Dr Scott 

## 2017-03-14 DIAGNOSIS — G5601 Carpal tunnel syndrome, right upper limb: Secondary | ICD-10-CM | POA: Diagnosis not present

## 2017-03-14 DIAGNOSIS — M25531 Pain in right wrist: Secondary | ICD-10-CM | POA: Diagnosis not present

## 2017-03-15 ENCOUNTER — Encounter
Admission: RE | Admit: 2017-03-15 | Discharge: 2017-03-15 | Disposition: A | Discharge status: Home / Self Care | Payer: Medicare Other | Source: Ambulatory Visit | Attending: Orthopedic Surgery | Admitting: Orthopedic Surgery

## 2017-03-15 DIAGNOSIS — E119 Type 2 diabetes mellitus without complications: Secondary | ICD-10-CM | POA: Diagnosis not present

## 2017-03-15 DIAGNOSIS — I739 Peripheral vascular disease, unspecified: Secondary | ICD-10-CM | POA: Diagnosis not present

## 2017-03-15 DIAGNOSIS — Z885 Allergy status to narcotic agent status: Secondary | ICD-10-CM | POA: Diagnosis not present

## 2017-03-15 DIAGNOSIS — M25531 Pain in right wrist: Secondary | ICD-10-CM | POA: Diagnosis present

## 2017-03-15 DIAGNOSIS — I10 Essential (primary) hypertension: Secondary | ICD-10-CM | POA: Diagnosis not present

## 2017-03-15 DIAGNOSIS — Z883 Allergy status to other anti-infective agents status: Secondary | ICD-10-CM | POA: Diagnosis not present

## 2017-03-15 DIAGNOSIS — E7800 Pure hypercholesterolemia, unspecified: Secondary | ICD-10-CM | POA: Diagnosis not present

## 2017-03-15 DIAGNOSIS — K219 Gastro-esophageal reflux disease without esophagitis: Secondary | ICD-10-CM | POA: Diagnosis not present

## 2017-03-15 DIAGNOSIS — Z881 Allergy status to other antibiotic agents status: Secondary | ICD-10-CM | POA: Diagnosis not present

## 2017-03-15 DIAGNOSIS — G4730 Sleep apnea, unspecified: Secondary | ICD-10-CM | POA: Diagnosis not present

## 2017-03-15 DIAGNOSIS — J449 Chronic obstructive pulmonary disease, unspecified: Secondary | ICD-10-CM | POA: Diagnosis not present

## 2017-03-15 DIAGNOSIS — S52531A Colles' fracture of right radius, initial encounter for closed fracture: Secondary | ICD-10-CM | POA: Diagnosis not present

## 2017-03-15 DIAGNOSIS — E039 Hypothyroidism, unspecified: Secondary | ICD-10-CM | POA: Diagnosis not present

## 2017-03-15 DIAGNOSIS — F329 Major depressive disorder, single episode, unspecified: Secondary | ICD-10-CM | POA: Diagnosis not present

## 2017-03-15 DIAGNOSIS — G5601 Carpal tunnel syndrome, right upper limb: Secondary | ICD-10-CM | POA: Diagnosis not present

## 2017-03-15 DIAGNOSIS — Z79899 Other long term (current) drug therapy: Secondary | ICD-10-CM | POA: Diagnosis not present

## 2017-03-15 DIAGNOSIS — Z882 Allergy status to sulfonamides status: Secondary | ICD-10-CM | POA: Diagnosis not present

## 2017-03-15 HISTORY — DX: Hypothyroidism, unspecified: E03.9

## 2017-03-15 HISTORY — DX: Chronic obstructive pulmonary disease, unspecified: J44.9

## 2017-03-15 HISTORY — DX: Depression, unspecified: F32.A

## 2017-03-15 HISTORY — DX: Sleep apnea, unspecified: G47.30

## 2017-03-15 HISTORY — DX: Major depressive disorder, single episode, unspecified: F32.9

## 2017-03-15 LAB — CBC
HEMATOCRIT: 35.4 % (ref 35.0–47.0)
Hemoglobin: 11.4 g/dL — ABNORMAL LOW (ref 12.0–16.0)
MCH: 29.8 pg (ref 26.0–34.0)
MCHC: 32.2 g/dL (ref 32.0–36.0)
MCV: 92.3 fL (ref 80.0–100.0)
Platelets: 246 10*3/uL (ref 150–440)
RBC: 3.83 MIL/uL (ref 3.80–5.20)
RDW: 16.1 % — AB (ref 11.5–14.5)
WBC: 7.7 10*3/uL (ref 3.6–11.0)

## 2017-03-15 LAB — BASIC METABOLIC PANEL
ANION GAP: 9 (ref 5–15)
BUN: 31 mg/dL — ABNORMAL HIGH (ref 6–20)
CALCIUM: 9 mg/dL (ref 8.9–10.3)
CO2: 27 mmol/L (ref 22–32)
Chloride: 101 mmol/L (ref 101–111)
Creatinine, Ser: 1.26 mg/dL — ABNORMAL HIGH (ref 0.44–1.00)
GFR, EST AFRICAN AMERICAN: 43 mL/min — AB (ref >60)
GFR, EST NON AFRICAN AMERICAN: 37 mL/min — AB (ref >60)
GLUCOSE: 153 mg/dL — AB (ref 65–99)
POTASSIUM: 4.2 mmol/L (ref 3.5–5.1)
Sodium: 137 mmol/L (ref 135–145)

## 2017-03-15 NOTE — Pre-Procedure Instructions (Signed)
Patient and daughter state that no one has ever told patient that she has diabetes.

## 2017-03-15 NOTE — Patient Instructions (Signed)
Your procedure is scheduled on: March 17, 2017  Report to [facility]  To find out your arrival time please call [phone] between 1PM - 3PM on Wednesday, March 16, 2017  Remember: Instructions that are not followed completely may result in serious medical risk, up to and including death, or upon the discretion of your surgeon and anesthesiologist your surgery may need to be rescheduled.     _X__ 1. Do not eat food after midnight the night before your procedure.                 No gum chewing or hard candies. You may drink clear liquids up to 2 hours                 before you are scheduled to arrive for your surgery- DO not drink clear                 liquids within 2 hours of the start of your surgery.                 Clear Liquids include:  water, apple juice without pulp, clear carbohydrate                 drink such as Clearfast of Gartorade, Black Coffee or Tea (Do not add                 anything to coffee or tea).     _X__ 2.  No Alcohol for 24 hours before or after surgery.   _X__ 3.  Do Not Smoke or use e-cigarettes For 24 Hours Prior to Your Surgery.                 Do not use any chewable tobacco products for at least 6 hours prior to                 surgery.  ____  4.  Bring all medications with you on the day of surgery if instructed.   ____  5.  Notify your doctor if there is any change in your medical condition      (cold, fever, infections).     Do not wear jewelry, make-up, hairpins, clips or nail polish. Do not wear lotions, powders, or perfumes. You may wear deodorant. Do not shave 48 hours prior to surgery. Men may shave face and neck. Do not bring valuables to the hospital.    [facility] is not responsible for any belongings or valuables.  Contacts, dentures or bridgework may not be worn into surgery. Leave your suitcase in the car. After surgery it may be brought to your room. For patients admitted to the  hospital, discharge time is determined by your treatment team.   Patients discharged the day of surgery will not be allowed to drive home.   Please read over the following fact sheets that you were given:   PREPARING FOR SURGERY   __X__ Take these medicines the morning of surgery with A SIP OF WATER:    1.PROTONIX  2. ADVAIR  3. ZYRTEC  4. PROZAC  5. SYNTHROID  6. DETROL             7. HIPREX             8. VENTOLIN  ____ Fleet Enema (as directed)   __X__ Use CHG Soap as directed  __X__ Use inhalers on the day of surgery  ____ Stop metformin 2 days prior  to surgery    ____ Take 1/2 of usual insulin dose the night before surgery. No insulin the morning          of surgery.   _X___ Stop Coumadin/Plavix/ASPIRIN AS OF TODAY    __X__ Stop Anti-inflammatories AS OF TODAY.  THIS INCLUDES IBUPROFEN/MOTRIN/ALEVE/ADVIL   _X_ Stop supplements until after surgery.    ____ Bring C-Pap to the hospital.   BRING ADVANCE DIRECTIVE TO HOSPITAL SO WE CAN MAKE A COPY TO PUT INTO THE CHART.

## 2017-03-16 NOTE — Pre-Procedure Instructions (Signed)
Dr. Karlton LemonKarenz approved proceeding with surgery after reviewing the EKG.

## 2017-03-16 NOTE — Pre-Procedure Instructions (Signed)
Met B and CBC results sent to Dr. Rudene Christians and Anesthesia for review.

## 2017-03-17 ENCOUNTER — Encounter
Admission: RE | Disposition: A | Discharge status: Home / Self Care | Payer: Self-pay | Source: Ambulatory Visit | Attending: Orthopedic Surgery

## 2017-03-17 ENCOUNTER — Encounter: Payer: Self-pay | Admitting: *Deleted

## 2017-03-17 ENCOUNTER — Ambulatory Visit: Payer: Medicare Other

## 2017-03-17 ENCOUNTER — Ambulatory Visit: Payer: Medicare Other | Admitting: Anesthesiology

## 2017-03-17 ENCOUNTER — Ambulatory Visit
Admission: RE | Admit: 2017-03-17 | Discharge: 2017-03-17 | Disposition: A | Discharge status: Home / Self Care | Payer: Medicare Other | Source: Ambulatory Visit | Attending: Orthopedic Surgery | Admitting: Orthopedic Surgery

## 2017-03-17 DIAGNOSIS — I739 Peripheral vascular disease, unspecified: Secondary | ICD-10-CM | POA: Diagnosis not present

## 2017-03-17 DIAGNOSIS — E7800 Pure hypercholesterolemia, unspecified: Secondary | ICD-10-CM | POA: Insufficient documentation

## 2017-03-17 DIAGNOSIS — K219 Gastro-esophageal reflux disease without esophagitis: Secondary | ICD-10-CM | POA: Insufficient documentation

## 2017-03-17 DIAGNOSIS — I10 Essential (primary) hypertension: Secondary | ICD-10-CM | POA: Insufficient documentation

## 2017-03-17 DIAGNOSIS — Z79899 Other long term (current) drug therapy: Secondary | ICD-10-CM | POA: Insufficient documentation

## 2017-03-17 DIAGNOSIS — Z882 Allergy status to sulfonamides status: Secondary | ICD-10-CM | POA: Insufficient documentation

## 2017-03-17 DIAGNOSIS — F329 Major depressive disorder, single episode, unspecified: Secondary | ICD-10-CM | POA: Insufficient documentation

## 2017-03-17 DIAGNOSIS — G4730 Sleep apnea, unspecified: Secondary | ICD-10-CM | POA: Diagnosis not present

## 2017-03-17 DIAGNOSIS — E119 Type 2 diabetes mellitus without complications: Secondary | ICD-10-CM | POA: Diagnosis not present

## 2017-03-17 DIAGNOSIS — J449 Chronic obstructive pulmonary disease, unspecified: Secondary | ICD-10-CM | POA: Diagnosis not present

## 2017-03-17 DIAGNOSIS — Z885 Allergy status to narcotic agent status: Secondary | ICD-10-CM | POA: Insufficient documentation

## 2017-03-17 DIAGNOSIS — G5601 Carpal tunnel syndrome, right upper limb: Secondary | ICD-10-CM | POA: Insufficient documentation

## 2017-03-17 DIAGNOSIS — Z9889 Other specified postprocedural states: Secondary | ICD-10-CM

## 2017-03-17 DIAGNOSIS — S52531A Colles' fracture of right radius, initial encounter for closed fracture: Secondary | ICD-10-CM | POA: Insufficient documentation

## 2017-03-17 DIAGNOSIS — Z8781 Personal history of (healed) traumatic fracture: Secondary | ICD-10-CM

## 2017-03-17 DIAGNOSIS — Z883 Allergy status to other anti-infective agents status: Secondary | ICD-10-CM | POA: Insufficient documentation

## 2017-03-17 DIAGNOSIS — E039 Hypothyroidism, unspecified: Secondary | ICD-10-CM | POA: Diagnosis not present

## 2017-03-17 DIAGNOSIS — M25531 Pain in right wrist: Secondary | ICD-10-CM | POA: Diagnosis not present

## 2017-03-17 DIAGNOSIS — S52501A Unspecified fracture of the lower end of right radius, initial encounter for closed fracture: Secondary | ICD-10-CM | POA: Diagnosis not present

## 2017-03-17 DIAGNOSIS — Z881 Allergy status to other antibiotic agents status: Secondary | ICD-10-CM | POA: Insufficient documentation

## 2017-03-17 HISTORY — PX: CARPAL TUNNEL RELEASE: SHX101

## 2017-03-17 HISTORY — PX: ORIF WRIST FRACTURE: SHX2133

## 2017-03-17 LAB — GLUCOSE, CAPILLARY
GLUCOSE-CAPILLARY: 112 mg/dL — AB (ref 65–99)
Glucose-Capillary: 120 mg/dL — ABNORMAL HIGH (ref 65–99)

## 2017-03-17 SURGERY — OPEN REDUCTION INTERNAL FIXATION (ORIF) WRIST FRACTURE
Anesthesia: Regional | Laterality: Right

## 2017-03-17 MED ORDER — SODIUM CHLORIDE 0.9 % IV SOLN
INTRAVENOUS | Status: DC
  Administered 2017-03-17: 08:00:00 via INTRAVENOUS

## 2017-03-17 MED ORDER — PROPOFOL 10 MG/ML IV BOLUS
INTRAVENOUS | Status: DC | PRN
  Administered 2017-03-17: 5 mg via INTRAVENOUS
  Administered 2017-03-17 (×2): 10 mg via INTRAVENOUS

## 2017-03-17 MED ORDER — KETAMINE HCL 50 MG/ML IJ SOLN
INTRAMUSCULAR | Status: DC | PRN
  Administered 2017-03-17: 25 mg via INTRAMUSCULAR

## 2017-03-17 MED ORDER — MIDAZOLAM HCL 2 MG/2ML IJ SOLN
INTRAMUSCULAR | Status: AC
Start: 2017-03-17 — End: 2017-03-17
  Filled 2017-03-17: qty 2

## 2017-03-17 MED ORDER — CHLORHEXIDINE GLUCONATE 4 % EX LIQD: Freq: Once | CUTANEOUS | Status: DC

## 2017-03-17 MED ORDER — LIDOCAINE HCL (PF) 0.5 % IJ SOLN
INTRAMUSCULAR | Status: DC | PRN
  Administered 2017-03-17: 50 mL via INTRAVENOUS

## 2017-03-17 MED ORDER — METOPROLOL TARTRATE 5 MG/5ML IV SOLN
INTRAVENOUS | Status: DC | PRN
  Administered 2017-03-17: 2 mg via INTRAVENOUS

## 2017-03-17 MED ORDER — CLINDAMYCIN PHOSPHATE 900 MG/50ML IV SOLN
900.0000 mg | Freq: Once | INTRAVENOUS | Status: AC
  Administered 2017-03-17: 900 mg via INTRAVENOUS

## 2017-03-17 MED ORDER — PROPOFOL 500 MG/50ML IV EMUL
INTRAVENOUS | Status: DC | PRN
  Administered 2017-03-17: 30 ug/kg/min via INTRAVENOUS

## 2017-03-17 MED ORDER — DEXAMETHASONE SODIUM PHOSPHATE 10 MG/ML IJ SOLN
INTRAMUSCULAR | Status: DC | PRN
  Administered 2017-03-17: 5 mg via INTRAVENOUS

## 2017-03-17 MED ORDER — KETAMINE HCL 50 MG/ML IJ SOLN
INTRAMUSCULAR | Status: AC
  Filled 2017-03-17: qty 10

## 2017-03-17 MED ORDER — ONDANSETRON HCL 4 MG/2ML IJ SOLN
4.0000 mg | Freq: Once | INTRAMUSCULAR | Status: DC | PRN
Start: 2017-03-17 — End: 2017-03-17

## 2017-03-17 MED ORDER — METHYLPREDNISOLONE SODIUM SUCC 125 MG IJ SOLR
INTRAMUSCULAR | Status: AC
  Filled 2017-03-17: qty 2

## 2017-03-17 MED ORDER — FENTANYL CITRATE (PF) 100 MCG/2ML IJ SOLN
INTRAMUSCULAR | Status: DC | PRN
  Administered 2017-03-17: 50 ug via INTRAVENOUS

## 2017-03-17 MED ORDER — MIDAZOLAM HCL 2 MG/2ML IJ SOLN
INTRAMUSCULAR | Status: AC
  Filled 2017-03-17: qty 2

## 2017-03-17 MED ORDER — IPRATROPIUM-ALBUTEROL 0.5-2.5 (3) MG/3ML IN SOLN
RESPIRATORY_TRACT | Status: AC
  Administered 2017-03-17: 3 mL
  Filled 2017-03-17: qty 3

## 2017-03-17 MED ORDER — MIDAZOLAM HCL 2 MG/2ML IJ SOLN
INTRAMUSCULAR | Status: DC | PRN
  Administered 2017-03-17 (×2): 1 mg via INTRAVENOUS

## 2017-03-17 MED ORDER — FENTANYL CITRATE (PF) 100 MCG/2ML IJ SOLN
INTRAMUSCULAR | Status: AC
  Filled 2017-03-17: qty 2

## 2017-03-17 MED ORDER — GLYCOPYRROLATE 0.2 MG/ML IJ SOLN
INTRAMUSCULAR | Status: DC | PRN
  Administered 2017-03-17: 0.2 mg via INTRAVENOUS

## 2017-03-17 MED ORDER — FENTANYL CITRATE (PF) 100 MCG/2ML IJ SOLN: 25.0000 ug | INTRAMUSCULAR | Status: DC | PRN

## 2017-03-17 MED ORDER — ALBUTEROL SULFATE HFA 108 (90 BASE) MCG/ACT IN AERS
INHALATION_SPRAY | RESPIRATORY_TRACT | Status: DC | PRN
  Administered 2017-03-17: 5 via RESPIRATORY_TRACT

## 2017-03-17 MED ORDER — METHYLPREDNISOLONE SODIUM SUCC 125 MG IJ SOLR
INTRAMUSCULAR | Status: DC | PRN
  Administered 2017-03-17: 125 mg via INTRAVENOUS

## 2017-03-17 MED ORDER — LIDOCAINE HCL (PF) 0.5 % IJ SOLN
INTRAMUSCULAR | Status: AC
  Filled 2017-03-17: qty 50

## 2017-03-17 MED ORDER — BUPIVACAINE HCL 0.5 % IJ SOLN
INTRAMUSCULAR | Status: DC | PRN
  Administered 2017-03-17: 18 mL

## 2017-03-17 MED ORDER — CLINDAMYCIN PHOSPHATE 900 MG/50ML IV SOLN
INTRAVENOUS | Status: AC
  Filled 2017-03-17: qty 50

## 2017-03-17 MED ORDER — IPRATROPIUM-ALBUTEROL 0.5-2.5 (3) MG/3ML IN SOLN: 3.0000 mL | Freq: Once | RESPIRATORY_TRACT | Status: DC

## 2017-03-17 MED ORDER — PROPOFOL 10 MG/ML IV BOLUS
INTRAVENOUS | Status: AC
  Filled 2017-03-17: qty 20

## 2017-03-17 MED ORDER — KETOROLAC TROMETHAMINE 30 MG/ML IJ SOLN
INTRAMUSCULAR | Status: DC | PRN
  Administered 2017-03-17: 30 mg via INTRAVENOUS

## 2017-03-17 MED ORDER — HYDROCODONE-ACETAMINOPHEN 5-325 MG PO TABS: 1.0000 | ORAL_TABLET | Freq: Four times a day (QID) | ORAL | 0 refills | Status: DC | PRN

## 2017-03-17 SURGICAL SUPPLY — 39 items
BANDAGE ACE 3X5.8 VEL STRL LF (GAUZE/BANDAGES/DRESSINGS) ×3 IMPLANT
BANDAGE ACE 4X5 VEL STRL LF (GAUZE/BANDAGES/DRESSINGS) ×3 IMPLANT
BANDAGE ELASTIC 4 LF NS (GAUZE/BANDAGES/DRESSINGS) ×3 IMPLANT
CANISTER SUCT 1200ML W/VALVE (MISCELLANEOUS) ×3 IMPLANT
CHLORAPREP W/TINT 26ML (MISCELLANEOUS) ×3 IMPLANT
CUFF TOURN 18 STER (MISCELLANEOUS) IMPLANT
DRAPE FLUOR MINI C-ARM 54X84 (DRAPES) ×3 IMPLANT
ELECT CAUTERY BLADE 6.4 (BLADE) IMPLANT
ELECT CAUTERY NEEDLE 2.0 MIC (NEEDLE) IMPLANT
ELECT REM PT RETURN 9FT ADLT (ELECTROSURGICAL) ×3
ELECTRODE REM PT RTRN 9FT ADLT (ELECTROSURGICAL) ×1 IMPLANT
GAUZE PETRO XEROFOAM 1X8 (MISCELLANEOUS) ×3 IMPLANT
GAUZE SPONGE 4X4 12PLY STRL (GAUZE/BANDAGES/DRESSINGS) ×3 IMPLANT
GLOVE SURG SYN 9.0  PF PI (GLOVE) ×2
GLOVE SURG SYN 9.0 PF PI (GLOVE) ×1 IMPLANT
GOWN SRG 2XL LVL 4 RGLN SLV (GOWNS) ×1 IMPLANT
GOWN STRL NON-REIN 2XL LVL4 (GOWNS) ×2
GOWN STRL REUS W/ TWL LRG LVL3 (GOWN DISPOSABLE) ×1 IMPLANT
GOWN STRL REUS W/TWL LRG LVL3 (GOWN DISPOSABLE) ×2
KIT RM TURNOVER STRD PROC AR (KITS) ×3 IMPLANT
NEEDLE FILTER BLUNT 18X 1/2SAF (NEEDLE) ×2
NEEDLE FILTER BLUNT 18X1 1/2 (NEEDLE) ×1 IMPLANT
NS IRRIG 500ML POUR BTL (IV SOLUTION) ×3 IMPLANT
PACK EXTREMITY ARMC (MISCELLANEOUS) ×3 IMPLANT
PAD CAST CTTN 4X4 STRL (SOFTGOODS) ×1 IMPLANT
PADDING CAST COTTON 4X4 STRL (SOFTGOODS) ×2
PEG SUBCHONDRAL SMOOTH 2.0X16 (Peg) ×3 IMPLANT
PEG SUBCHONDRAL SMOOTH 2.0X18 (Peg) ×3 IMPLANT
PLATE SHORT 21.6X48.9 NRRW RT (Plate) ×3 IMPLANT
SCREW CORT 3.5X10 LNG (Screw) ×3 IMPLANT
SCREW MULTI DIRECT 20MM (Screw) ×3 IMPLANT
SCREW MULTI DIRECT 22MM (Screw) ×3 IMPLANT
SCREW MULTI DIRECT 24MM (Screw) ×6 IMPLANT
SPLINT CAST 1 STEP 3X12 (MISCELLANEOUS) ×3 IMPLANT
SUT ETHILON 4-0 (SUTURE) ×2
SUT ETHILON 4-0 FS2 18XMFL BLK (SUTURE) ×1
SUT VICRYL 3-0 27IN (SUTURE) ×3 IMPLANT
SUTURE ETHLN 4-0 FS2 18XMF BLK (SUTURE) ×1 IMPLANT
SYR 3ML LL SCALE MARK (SYRINGE) ×3 IMPLANT

## 2017-03-17 NOTE — Op Note (Signed)
03/17/2017  9:40 AM  PATIENT:  Diamond Newman  83 y.o. female  PRE-OPERATIVE DIAGNOSIS:  RIGHT WRIST PAIN right carpal tunnel syndrome displaced distal radius fracture 2 part  POST-OPERATIVE DIAGNOSIS:  RIGHT WRIST PAIN same  PROCEDURE:  Procedure(s): OPEN REDUCTION INTERNAL FIXATION (ORIF) WRIST FRACTURE (Right) CARPAL TUNNEL RELEASE (Right)  SURGEON: Leitha SchullerMichael J Lowell Mcgurk, MD  ASSISTANTS: None  ANESTHESIA:   Bier block  EBL:  No intake/output data recorded.  BLOOD ADMINISTERED:none  DRAINS: none   LOCAL MEDICATIONS USED:  MARCAINE     SPECIMEN:  No Specimen  DISPOSITION OF SPECIMEN:  N/A  COUNTS:  YES  TOURNIQUET:   Total Tourniquet Time Documented: Upper Arm (Right) - 43 minutes Total: Upper Arm (Right) - 43 minutes   IMPLANTS: [employer] hand innovations short narrow DVR with multiple screws and pegs  DICTATION: .Dragon Dictation patient brought the operating room and after Bier block was given the arm was prepped and draped in sterile fashion. After patient identification and timeout procedure were completed approximately 2 cm incision made in line with the ring metacarpal. The transverse carpal ligament was identified and opened with a vascular hemostat underlying this to protect the underlying structures and with scissors releases carried out proximally and distally until there is no visible pressure on the nerve. The wound was irrigated and then infiltrated with 10 cc of half percent Sensorcaine for postop analgesia and closed with simple interrupted 5-0 nylon skin sutures. The distal radius was then approached with a level were approach centered over the FCR tendon the tendon sheath was incised and the tendon retracted radially deep fascia incised and the pronator elevated off the distal fragment and shaft. The fracture site was examined and still had some mobility and with fingertrap traction off the end of the bed close reduction could be utilized to improve alignment so a  distal first approach was utilized with the plate. The plate was placed in the appropriate position and then multiple smooth pegs and multidirectional screws were placed and the distal fragment the plate was then brought down to the shaft with 310 mm screws and near anatomic alignment was obtained. The edge of the plate was close to the joint space but on fluoroscopic views different angles there did not appear to be penetration into the joint with this pegs or screws. At this point the tourniquet started going down to release the Bier block and the wounds were irrigated and then closed with 3-0 Vicryl subcutaneously and 5-0 nylon for the skin Xeroform 4 x 4 web roll volar splint and Ace wrap then applied  PLAN OF CARE: Discharge to home after PACU  PATIENT DISPOSITION:  PACU - hemodynamically stable.

## 2017-03-17 NOTE — H&P (Signed)
Reviewed paper H+P, will be scanned into chart. No changes noted.  

## 2017-03-17 NOTE — Anesthesia Preprocedure Evaluation (Signed)
Anesthesia Evaluation  Patient identified by MRN, date of birth, ID band Patient awake    Reviewed: Allergy & Precautions, NPO status , Patient's Chart, lab work & pertinent test results  Airway Mallampati: II  TM Distance: <3 FB     Dental  (+) Caps, Chipped   Pulmonary asthma , sleep apnea , COPD,  COPD inhaler,     + wheezing      Cardiovascular hypertension, Pt. on medications + Peripheral Vascular Disease  Normal cardiovascular exam     Neuro/Psych PSYCHIATRIC DISORDERS Depression    GI/Hepatic GERD  Medicated,  Endo/Other  diabetes, Well Controlled, Type 2Hypothyroidism   Renal/GU Renal InsufficiencyRenal disease  negative genitourinary   Musculoskeletal   Abdominal Normal abdominal exam  (+)   Peds negative pediatric ROS (+)  Hematology   Anesthesia Other Findings   Reproductive/Obstetrics                             Anesthesia Physical Anesthesia Plan  ASA: III  Anesthesia Plan: Bier Block   Post-op Pain Management:    Induction: Intravenous  PONV Risk Score and Plan:   Airway Management Planned: Nasal Cannula  Additional Equipment:   Intra-op Plan:   Post-operative Plan:   Informed Consent: I have reviewed the patients History and Physical, chart, labs and discussed the procedure including the risks, benefits and alternatives for the proposed anesthesia with the patient or authorized representative who has indicated his/her understanding and acceptance.   Dental advisory given  Plan Discussed with: CRNA and Surgeon  Anesthesia Plan Comments:         Anesthesia Quick Evaluation

## 2017-03-17 NOTE — Transfer of Care (Signed)
Immediate Anesthesia Transfer of Care Note  Patient: Diamond Newman  Procedure(s) Performed: OPEN REDUCTION INTERNAL FIXATION (ORIF) WRIST FRACTURE (Right ) CARPAL TUNNEL RELEASE (Right )  Patient Location: PACU  Anesthesia Type:Regional  Level of Consciousness: awake, alert  and oriented  Airway & Oxygen Therapy: Patient Spontanous Breathing and Patient connected to face mask oxygen  Post-op Assessment: Report given to RN and Post -op Vital signs reviewed and stable  Post vital signs: Reviewed and stable  Last Vitals:  Vitals:   03/17/17 0723 03/17/17 0940  BP: (!) 146/104   Pulse: 99   Resp: 18 (!) 22  Temp: (!) 36.3 C 36.9 C  SpO2: 95%     Last Pain:  Vitals:   03/17/17 0723  TempSrc: Tympanic  PainSc: 0-No pain         Complications: No apparent anesthesia complications

## 2017-03-17 NOTE — Anesthesia Procedure Notes (Signed)
Anesthesia Regional Block: Bier block (IV Regional)   Pre-Anesthetic Checklist: ,, timeout performed, Correct Patient, Correct Site, Correct Laterality, Correct Procedure, Correct Position, site marked, Risks and benefits discussed,  Surgical consent,  Pre-op evaluation,  At surgeon's request and post-op pain management   Prep: alcohol swabs       Needles:  Injection technique: Single-shot      Needle Gauge: 20     Additional Needles:   Procedures:,,,,, intact distal pulses, Esmarch exsanguination,, #20gu IV placed and double tourniquet utilized  Narrative:  Injection made incrementally with aspirations every 5 mL.  Performed by: Personally   Additional Notes: The patient tolerated the procedure well.

## 2017-03-17 NOTE — Anesthesia Post-op Follow-up Note (Signed)
Anesthesia QCDR form completed.        

## 2017-03-17 NOTE — Anesthesia Procedure Notes (Signed)
Date/Time: 03/17/2017 8:33 AM Performed by: Junious SilkNOLES, Malaney Mcbean Pre-anesthesia Checklist: Patient identified, Emergency Drugs available, Suction available, Patient being monitored and Timeout performed Oxygen Delivery Method: Simple face mask

## 2017-03-17 NOTE — Discharge Instructions (Addendum)
Keep arm elevated as much as possible. Work fingers is much as possible. Pain medicine as directed. Loosen Ace wrap if fingers swell  AMBULATORY SURGERY  DISCHARGE INSTRUCTIONS   1) The drugs that you were given will stay in your system until tomorrow so for the next 24 hours you should not:  A) Drive an automobile B) Make any legal decisions C) Drink any alcoholic beverage   2) You may resume regular meals tomorrow.  Today it is better to start with liquids and gradually work up to solid foods.  You may eat anything you prefer, but it is better to start with liquids, then soup and crackers, and gradually work up to solid foods.   3) Please notify your doctor immediately if you have any unusual bleeding, trouble breathing, redness and pain at the surgery site, drainage, fever, or pain not relieved by medication.    4) Additional Instructions: TAKE A STOOL SOFTENER TWICE A DAY WHILE TAKING NARCOTIC PAIN MEDICINE TO PREVENT CONSTIPATION   Please contact your physician with any problems or Same Day Surgery at [phone], Monday through Friday 6 am to 4 pm, or [place] at [facility] Main number at [phone].

## 2017-03-18 ENCOUNTER — Encounter: Payer: Self-pay | Admitting: Orthopedic Surgery

## 2017-03-18 NOTE — Anesthesia Postprocedure Evaluation (Signed)
Anesthesia Post Note  Patient: Diamond Newman  Procedure(s) Performed: OPEN REDUCTION INTERNAL FIXATION (ORIF) WRIST FRACTURE (Right ) CARPAL TUNNEL RELEASE (Right )  Patient location during evaluation: PACU Anesthesia Type: Bier Block Level of consciousness: awake and alert and oriented Pain management: pain level controlled Vital Signs Assessment: post-procedure vital signs reviewed and stable Respiratory status: spontaneous breathing Cardiovascular status: blood pressure returned to baseline Anesthetic complications: no     Last Vitals:  Vitals:   03/17/17 1057 03/17/17 1146  BP: (!) 152/88 (!) 174/95  Pulse: 89 82  Resp: 20 20  Temp:    SpO2: 95% 94%    Last Pain:  Vitals:   03/18/17 0832  TempSrc:   PainSc: 0-No pain                 Sherial Ebrahim

## 2017-03-24 ENCOUNTER — Other Ambulatory Visit: Payer: Self-pay | Admitting: Internal Medicine

## 2017-03-25 ENCOUNTER — Other Ambulatory Visit: Payer: Self-pay | Admitting: Internal Medicine

## 2017-04-01 DIAGNOSIS — Z9889 Other specified postprocedural states: Secondary | ICD-10-CM | POA: Diagnosis not present

## 2017-04-11 ENCOUNTER — Other Ambulatory Visit: Payer: Self-pay

## 2017-04-11 ENCOUNTER — Ambulatory Visit (INDEPENDENT_AMBULATORY_CARE_PROVIDER_SITE_OTHER): Payer: Medicare Other | Admitting: Internal Medicine

## 2017-04-11 ENCOUNTER — Encounter: Payer: Self-pay | Admitting: Internal Medicine

## 2017-04-11 VITALS — BP 120/74 | HR 100 | Temp 97.5°F | Resp 16 | Ht 66.0 in | Wt 192.4 lb

## 2017-04-11 DIAGNOSIS — E538 Deficiency of other specified B group vitamins: Secondary | ICD-10-CM

## 2017-04-11 DIAGNOSIS — E669 Obesity, unspecified: Secondary | ICD-10-CM | POA: Diagnosis not present

## 2017-04-11 DIAGNOSIS — I739 Peripheral vascular disease, unspecified: Secondary | ICD-10-CM | POA: Diagnosis not present

## 2017-04-11 DIAGNOSIS — E7800 Pure hypercholesterolemia, unspecified: Secondary | ICD-10-CM

## 2017-04-11 DIAGNOSIS — E1151 Type 2 diabetes mellitus with diabetic peripheral angiopathy without gangrene: Secondary | ICD-10-CM | POA: Diagnosis not present

## 2017-04-11 DIAGNOSIS — N183 Chronic kidney disease, stage 3 unspecified: Secondary | ICD-10-CM

## 2017-04-11 DIAGNOSIS — K219 Gastro-esophageal reflux disease without esophagitis: Secondary | ICD-10-CM | POA: Diagnosis not present

## 2017-04-11 DIAGNOSIS — E559 Vitamin D deficiency, unspecified: Secondary | ICD-10-CM

## 2017-04-11 DIAGNOSIS — D649 Anemia, unspecified: Secondary | ICD-10-CM | POA: Diagnosis not present

## 2017-04-11 DIAGNOSIS — I10 Essential (primary) hypertension: Secondary | ICD-10-CM

## 2017-04-11 DIAGNOSIS — G4730 Sleep apnea, unspecified: Secondary | ICD-10-CM | POA: Diagnosis not present

## 2017-04-11 MED ORDER — FLUTICASONE-SALMETEROL 250-50 MCG/DOSE IN AEPB: 1.0000 | INHALATION_SPRAY | Freq: Two times a day (BID) | RESPIRATORY_TRACT | 0 refills | Status: DC

## 2017-04-11 MED ORDER — NYSTATIN 100000 UNIT/GM EX CREA: 1.0000 "application " | TOPICAL_CREAM | Freq: Two times a day (BID) | CUTANEOUS | 1 refills | Status: DC

## 2017-04-11 NOTE — Progress Notes (Signed)
[name], female   DOB: 1927-02-24, 83 y.o.   MRN: [id number]   Subjective:    [name], female    DOB: Mar 01, 1927, 83 y.o.   MRN: [id number]  HPI  Patient with past history of chronic bronchitis, COPD, diabetes, hypercholesterolemia and hypertension.  She comes in today to follow up on these issues as well as for a complete physical exam.   Is s/p right wrist ORIF with carpal tunnel release 03/17/17.  She is accompanied by her daughter.  History obtained from both of them.  Reports things are stable.  Breathing stable.  No chest pain.  No acid reflux.  No abdominal pain.  Bowels moving.  No urine change.  Has caretaker. Makes her meals, etc.  Daughter or son stay with her at night.     Past Medical History:  Diagnosis Date  . Asthma   . Chronic bronchitis ([place])   . COPD (chronic obstructive pulmonary disease) (HCC)    uses inhalers  . Depression   . Diabetes mellitus ([place])   . GERD (gastroesophageal reflux disease)    hiatal hernia  . Gout   . Hematuria    followed by urology  . History of colon polyps   . History of frequent urinary tract infections   . Humeral fracture 9/09   comminuted impacted proximal  . Hypercholesterolemia   . Hypertension   . Hypothyroidism   . Peripheral vascular disease ([place])   . Sleep apnea    does not use cpap as it did not benefit her   Past Surgical History:  Procedure Laterality Date  . BLADDER SUSPENSION  12/08/99  . BREAST BIOPSY Right 1960's  . CARPAL TUNNEL RELEASE Right 03/17/2017   Procedure: CARPAL TUNNEL RELEASE;  Surgeon: Hessie Knows, MD;  Location: [facility] ORS;  Service: Orthopedics;  Laterality: Right;  . CHOLECYSTECTOMY  1994  . COLONOSCOPY W/ POLYPECTOMY    . EYE SURGERY Bilateral    cataract extractions  . KNEE SURGERY Right 2013   arthroscopy  . ORIF WRIST FRACTURE Right 03/17/2017   Procedure: OPEN REDUCTION INTERNAL FIXATION (ORIF) WRIST FRACTURE;  Surgeon: Hessie Knows, MD;  Location: [facility]  ORS;  Service: Orthopedics;  Laterality: Right;   Family History  Problem Relation Age of Onset  . Hypertension Father   . CVA Father   . CAD Father   . Hypertension Mother   . Colitis Mother   . CAD Mother   . Breast cancer Neg Hx   . Colon cancer Neg Hx    Social History   Socioeconomic History  . Marital status: Widowed    Spouse name: None  . Number of children: 2  . Years of education: None  . Highest education level: None  Social Needs  . Financial resource strain: None  . Food insecurity - worry: None  . Food insecurity - inability: None  . Transportation needs - medical: None  . Transportation needs - non-medical: None  Occupational History  . None  Tobacco Use  . Smoking status: Never Smoker  . Smokeless tobacco: Never Used  Substance and Sexual Activity  . Alcohol use: No    Alcohol/week: 0.0 oz  . Drug use: No  . Sexual activity: No  Other Topics Concern  . None  Social History Narrative  . None    Outpatient Encounter Medications as of 04/11/2017  Medication Sig  . allopurinol (ZYLOPRIM) 100 MG tablet Take 1 tablet (100 mg total) by mouth  daily.  . Calcium Carbonate-Vitamin D (CALCIUM 600+D) 600-400 MG-UNIT per tablet Take 1 tablet by mouth daily.   . cetirizine (ZYRTEC) 10 MG tablet Take 10 mg by mouth daily.  . Cholecalciferol (VITAMIN D-3) 1000 UNITS CAPS Take 1 capsule by mouth daily.  . Cranberry-Cholecalciferol 4200-500 MG-UNIT CAPS Take 1 capsule by mouth daily.   . feeding supplement, ENSURE ENLIVE, (ENSURE ENLIVE) LIQD Take 237 mLs by mouth 2 (two) times daily between meals.  Marland Kitchen FLUoxetine (PROZAC) 20 MG capsule Take 1 capsule (20 mg total) by mouth daily.  . Fluticasone-Salmeterol (ADVAIR DISKUS) 250-50 MCG/DOSE AEPB Inhale 1 puff 2 (two) times daily into the lungs.  Marland Kitchen levothyroxine (SYNTHROID, LEVOTHROID) 50 MCG tablet Take 1 tablet (50 mcg total) by mouth daily.  . methenamine (HIPREX) 1 g tablet Take 1 tablet (1 g total) by mouth daily.    . montelukast (SINGULAIR) 10 MG tablet Take 1 tablet (10 mg total) by mouth at bedtime.  Marland Kitchen nystatin (NYSTATIN) powder Apply topically 2 (two) times daily.  Marland Kitchen nystatin cream (MYCOSTATIN) Apply 1 application 2 (two) times daily topically.  . pantoprazole (PROTONIX) 40 MG tablet Take 1 tablet (40 mg total) by mouth daily.  . simvastatin (ZOCOR) 20 MG tablet Take 1 tablet (20 mg total) by mouth daily. (Patient taking differently: Take 1 tablet (20 mg total) by mouth daily, in the evening)  . tolterodine (DETROL) 2 MG tablet Take 1 tablet (2 mg total) by mouth 2 (two) times daily.  . VENTOLIN HFA 108 (90 Base) MCG/ACT inhaler Inhale 2 puffs into the lungs every 6 (six) hours as needed for wheezi ng or shortness of breath.  . [DISCONTINUED] nystatin cream (MYCOSTATIN) Apply 1 application topically 2 (two) times daily.  . [DISCONTINUED] ADVAIR DISKUS 250-50 MCG/DOSE AEPB Inhale 1 puff into the lungs 2 (two) times daily.  . [DISCONTINUED] HYDROcodone-acetaminophen (NORCO) 5-325 MG tablet Take 1 tablet by mouth every 6 (six) hours as needed for moderate pain.  . [EXPIRED] cyanocobalamin ((VITAMIN B-12)) injection 1,000 mcg    No facility-administered encounter medications on file as of 04/11/2017.     Review of Systems  Constitutional: Negative for appetite change and unexpected weight change.  HENT: Negative for congestion and sinus pressure.   Eyes: Negative for pain and visual disturbance.  Respiratory: Negative for chest tightness.        Breathing stable.  No increased cough.    Cardiovascular: Negative for chest pain, palpitations and leg swelling.  Gastrointestinal: Negative for abdominal pain, diarrhea, nausea and vomiting.  Genitourinary: Negative for difficulty urinating and dysuria.  Musculoskeletal: Negative for back pain and joint swelling.  Skin: Negative for color change and rash.  Neurological: Negative for dizziness, light-headedness and headaches.  Hematological: Negative for  adenopathy. Does not bruise/bleed easily.  Psychiatric/Behavioral: Negative for agitation and dysphoric mood.       Objective:     Blood pressure rechecked by me:  120/74  Physical Exam  Constitutional: She is oriented to person, place, and time. She appears well-developed and well-nourished. No distress.  HENT:  Nose: Nose normal.  Mouth/Throat: Oropharynx is clear and moist.  Eyes: Right eye exhibits no discharge. Left eye exhibits no discharge. No scleral icterus.  Neck: Neck supple. No thyromegaly present.  Cardiovascular: Normal rate and regular rhythm.  Pulmonary/Chest: Breath sounds normal. No accessory muscle usage. No tachypnea. No respiratory distress. She has no decreased breath sounds. She has no wheezes. She has no rhonchi. Right breast exhibits no inverted nipple, no mass,  no nipple discharge and no tenderness (no axillary adenopathy). Left breast exhibits no inverted nipple, no mass, no nipple discharge and no tenderness (no axilarry adenopathy).  Abdominal: Soft. Bowel sounds are normal. There is no tenderness.  Musculoskeletal: She exhibits no edema or tenderness.  Lymphadenopathy:    She has no cervical adenopathy.  Neurological: She is alert and oriented to person, place, and time.  Skin: Skin is warm. No rash noted. No erythema.  Psychiatric: She has a normal mood and affect. Her behavior is normal.    BP 120/74   Pulse 100   Temp (!) 97.5 F (36.4 C) (Oral)   Resp 16   Ht '5\' 6"'  (1.676 m)   Wt 192 lb 6.4 oz (87.3 kg)   SpO2 95%   BMI 31.05 kg/m  Wt Readings from Last 3 Encounters:  04/11/17 192 lb 6.4 oz (87.3 kg)  03/17/17 194 lb (88 kg)  03/15/17 194 lb (88 kg)     Lab Results  Component Value Date   WBC 7.7 03/15/2017   HGB 11.4 (L) 03/15/2017   HCT 35.4 03/15/2017   PLT 246 03/15/2017   GLUCOSE 153 (H) 03/15/2017   CHOL 127 12/31/2016   TRIG 133.0 12/31/2016   HDL 44.00 12/31/2016   LDLCALC 56 12/31/2016   ALT 12 12/31/2016   AST 18  12/31/2016   NA 137 03/15/2017   K 4.2 03/15/2017   CL 101 03/15/2017   CREATININE 1.26 (H) 03/15/2017   BUN 31 (H) 03/15/2017   CO2 27 03/15/2017   TSH 2.69 12/31/2016   HGBA1C 6.9 (H) 12/31/2016   MICROALBUR 10.1 (H) 12/31/2016       Assessment & Plan:   Problem List Items Addressed This Visit    CKD (chronic kidney disease) stage 3, GFR 30-59 ml/min (HCC)    Avoid antiinflammatories.  Follow met b.        Diabetes mellitus with peripheral vascular disease (HCC)    Low carb diet and exercise.  Follow met b and a1c.        Relevant Orders   Hemoglobin A1c   GERD (gastroesophageal reflux disease)    Controlled on protonix.  Follow.       Hypercholesterolemia    On simvastatin.  Low cholesterol diet and exercise.  Follow lipid panel and liver function tests.        Relevant Orders   Hepatic function panel   Lipid panel   Hypertension    Blood pressure rechecked by me as outlined.  Overall under good control.  Follow met b and a1c.        Relevant Orders   Basic metabolic panel   Obesity (BMI 30.0-34.9)    Discussed diet and exercise.  Follow.        Peripheral vascular disease (HCC)    Varying pressures in her arms.  Unchanged.  Continue risk factor modification.        Sleep apnea    Tried cpap.  Did not tolerate.  Declines to retry.  Follow.        Vitamin B12 deficiency    Continue B12 injections.        Vitamin D deficiency    Follow vitamin D level.         Other Visit Diagnoses    B12 deficiency    -  Primary   Relevant Medications   cyanocobalamin ((VITAMIN B-12)) injection 1,000 mcg (Completed)   Anemia, unspecified type  Relevant Medications   cyanocobalamin ((VITAMIN B-12)) injection 1,000 mcg (Completed)   Other Relevant Orders   CBC with Differential/Platelet   Ferritin       Einar Pheasant, MD

## 2017-04-12 MED ORDER — CYANOCOBALAMIN 1000 MCG/ML IJ SOLN
1000.0000 ug | Freq: Once | INTRAMUSCULAR | Status: AC
  Administered 2017-04-11: 1000 ug via INTRAMUSCULAR

## 2017-04-14 ENCOUNTER — Encounter: Payer: Self-pay | Admitting: Internal Medicine

## 2017-04-14 NOTE — Assessment & Plan Note (Signed)
On simvastatin.  Low cholesterol diet and exercise.  Follow lipid panel and liver function tests.   

## 2017-04-14 NOTE — Assessment & Plan Note (Signed)
Blood pressure rechecked by me as outlined.  Overall under good control.  Follow met b and a1c.

## 2017-04-14 NOTE — Assessment & Plan Note (Signed)
Continue B12 injections.   

## 2017-04-14 NOTE — Assessment & Plan Note (Signed)
Follow vitamin D level.  

## 2017-04-14 NOTE — Assessment & Plan Note (Signed)
Controlled on protonix.  Follow.   

## 2017-04-14 NOTE — Assessment & Plan Note (Signed)
Low carb diet and exercise.  Follow met b and a1c.   

## 2017-04-14 NOTE — Assessment & Plan Note (Signed)
Avoid antiinflammatories.  Follow met b.

## 2017-04-14 NOTE — Assessment & Plan Note (Signed)
Discussed diet and exercise.  Follow.  

## 2017-04-14 NOTE — Assessment & Plan Note (Signed)
Tried cpap.  Did not tolerate.  Declines to retry.  Follow.

## 2017-04-14 NOTE — Assessment & Plan Note (Signed)
Varying pressures in her arms.  Unchanged.  Continue risk factor modification.

## 2017-04-21 ENCOUNTER — Other Ambulatory Visit: Payer: Self-pay

## 2017-04-21 ENCOUNTER — Other Ambulatory Visit: Payer: Self-pay | Admitting: Internal Medicine

## 2017-04-21 MED ORDER — TOLTERODINE TARTRATE 2 MG PO TABS
2.0000 mg | ORAL_TABLET | Freq: Two times a day (BID) | ORAL | 0 refills | Status: DC
Start: 2017-04-21 — End: 2017-07-07

## 2017-04-22 DIAGNOSIS — Z8781 Personal history of (healed) traumatic fracture: Secondary | ICD-10-CM | POA: Diagnosis not present

## 2017-04-22 DIAGNOSIS — Z967 Presence of other bone and tendon implants: Secondary | ICD-10-CM | POA: Diagnosis not present

## 2017-05-05 ENCOUNTER — Other Ambulatory Visit (INDEPENDENT_AMBULATORY_CARE_PROVIDER_SITE_OTHER): Payer: Medicare Other

## 2017-05-05 DIAGNOSIS — D649 Anemia, unspecified: Secondary | ICD-10-CM | POA: Diagnosis not present

## 2017-05-05 DIAGNOSIS — E7800 Pure hypercholesterolemia, unspecified: Secondary | ICD-10-CM

## 2017-05-05 DIAGNOSIS — E1151 Type 2 diabetes mellitus with diabetic peripheral angiopathy without gangrene: Secondary | ICD-10-CM | POA: Diagnosis not present

## 2017-05-05 DIAGNOSIS — I10 Essential (primary) hypertension: Secondary | ICD-10-CM | POA: Diagnosis not present

## 2017-05-05 LAB — HEPATIC FUNCTION PANEL
ALK PHOS: 59 U/L (ref 39–117)
ALT: 13 U/L (ref 0–35)
AST: 18 U/L (ref 0–37)
Albumin: 3.7 g/dL (ref 3.5–5.2)
BILIRUBIN DIRECT: 0.1 mg/dL (ref 0.0–0.3)
BILIRUBIN TOTAL: 0.6 mg/dL (ref 0.2–1.2)
Total Protein: 6.7 g/dL (ref 6.0–8.3)

## 2017-05-05 LAB — BASIC METABOLIC PANEL
BUN: 24 mg/dL — AB (ref 6–23)
CALCIUM: 9.3 mg/dL (ref 8.4–10.5)
CO2: 30 mEq/L (ref 19–32)
Chloride: 104 mEq/L (ref 96–112)
Creatinine, Ser: 1.28 mg/dL — ABNORMAL HIGH (ref 0.40–1.20)
GFR: 41.78 mL/min — AB (ref >60.00)
GLUCOSE: 131 mg/dL — AB (ref 70–99)
Potassium: 4.5 mEq/L (ref 3.5–5.1)
Sodium: 141 mEq/L (ref 135–145)

## 2017-05-05 LAB — LIPID PANEL
CHOLESTEROL: 129 mg/dL (ref 0–200)
HDL: 42.1 mg/dL (ref >39.00)
LDL Cholesterol: 54 mg/dL (ref 0–99)
NONHDL: 86.42
Total CHOL/HDL Ratio: 3
Triglycerides: 162 mg/dL — ABNORMAL HIGH (ref 0.0–149.0)
VLDL: 32.4 mg/dL (ref 0.0–40.0)

## 2017-05-05 LAB — CBC WITH DIFFERENTIAL/PLATELET
BASOS ABS: 0 10*3/uL (ref 0.0–0.1)
BASOS PCT: 0.5 % (ref 0.0–3.0)
EOS PCT: 4 % (ref 0.0–5.0)
Eosinophils Absolute: 0.3 10*3/uL (ref 0.0–0.7)
HEMATOCRIT: 38.9 % (ref 36.0–46.0)
Hemoglobin: 12.4 g/dL (ref 12.0–15.0)
LYMPHS PCT: 31.7 % (ref 12.0–46.0)
Lymphs Abs: 2.6 10*3/uL (ref 0.7–4.0)
MCHC: 31.8 g/dL (ref 30.0–36.0)
MCV: 94.6 fl (ref 78.0–100.0)
MONOS PCT: 7.8 % (ref 3.0–12.0)
Monocytes Absolute: 0.6 10*3/uL (ref 0.1–1.0)
NEUTROS ABS: 4.6 10*3/uL (ref 1.4–7.7)
Neutrophils Relative %: 56 % (ref 43.0–77.0)
PLATELETS: 275 10*3/uL (ref 150.0–400.0)
RBC: 4.12 Mil/uL (ref 3.87–5.11)
RDW: 15.9 % — ABNORMAL HIGH (ref 11.5–15.5)
WBC: 8.3 10*3/uL (ref 4.0–10.5)

## 2017-05-05 LAB — HEMOGLOBIN A1C: HEMOGLOBIN A1C: 6.7 % — AB (ref 4.6–6.5)

## 2017-05-05 LAB — FERRITIN: Ferritin: 15.6 ng/mL (ref 10.0–291.0)

## 2017-05-06 ENCOUNTER — Encounter: Payer: Self-pay | Admitting: Internal Medicine

## 2017-05-11 ENCOUNTER — Other Ambulatory Visit: Payer: Self-pay | Admitting: Internal Medicine

## 2017-05-12 ENCOUNTER — Ambulatory Visit (INDEPENDENT_AMBULATORY_CARE_PROVIDER_SITE_OTHER): Payer: Medicare Other | Admitting: *Deleted

## 2017-05-12 DIAGNOSIS — E538 Deficiency of other specified B group vitamins: Secondary | ICD-10-CM | POA: Diagnosis not present

## 2017-05-12 MED ORDER — CYANOCOBALAMIN 1000 MCG/ML IJ SOLN
1000.0000 ug | Freq: Once | INTRAMUSCULAR | Status: AC
  Administered 2017-05-12: 1000 ug via INTRAMUSCULAR

## 2017-05-12 NOTE — Progress Notes (Addendum)
Patient presented for B 12 injection to right deltoid, patient voiced no concerns nor showed any signs of distress during injection.  Reviewed.  Dr Scott 

## 2017-05-25 ENCOUNTER — Other Ambulatory Visit: Payer: Self-pay | Admitting: Internal Medicine

## 2017-06-03 ENCOUNTER — Other Ambulatory Visit: Payer: Self-pay | Admitting: Internal Medicine

## 2017-06-15 ENCOUNTER — Ambulatory Visit (INDEPENDENT_AMBULATORY_CARE_PROVIDER_SITE_OTHER): Payer: Medicare Other | Admitting: *Deleted

## 2017-06-15 DIAGNOSIS — E538 Deficiency of other specified B group vitamins: Secondary | ICD-10-CM

## 2017-06-15 MED ORDER — CYANOCOBALAMIN 1000 MCG/ML IJ SOLN
1000.0000 ug | Freq: Once | INTRAMUSCULAR | Status: AC
Start: 2017-06-15 — End: 2017-06-15
  Administered 2017-06-15: 1000 ug via INTRAMUSCULAR

## 2017-06-15 NOTE — Progress Notes (Addendum)
Patient presented for B 12 injection to right deltoid, patient voiced no concerns nor showed any signs of distress during injection.  Reviewed.  Dr Scott 

## 2017-06-29 ENCOUNTER — Other Ambulatory Visit: Payer: Self-pay

## 2017-06-29 ENCOUNTER — Emergency Department: Payer: Medicare Other

## 2017-06-29 ENCOUNTER — Emergency Department
Admission: EM | Admit: 2017-06-29 | Discharge: 2017-06-29 | Disposition: A | Discharge status: Home / Self Care | Payer: Medicare Other | Attending: Emergency Medicine | Admitting: Emergency Medicine

## 2017-06-29 DIAGNOSIS — E1122 Type 2 diabetes mellitus with diabetic chronic kidney disease: Secondary | ICD-10-CM | POA: Insufficient documentation

## 2017-06-29 DIAGNOSIS — Z79899 Other long term (current) drug therapy: Secondary | ICD-10-CM | POA: Insufficient documentation

## 2017-06-29 DIAGNOSIS — S42292A Other displaced fracture of upper end of left humerus, initial encounter for closed fracture: Secondary | ICD-10-CM | POA: Diagnosis not present

## 2017-06-29 DIAGNOSIS — S42202A Unspecified fracture of upper end of left humerus, initial encounter for closed fracture: Secondary | ICD-10-CM | POA: Diagnosis not present

## 2017-06-29 DIAGNOSIS — W19XXXA Unspecified fall, initial encounter: Secondary | ICD-10-CM | POA: Diagnosis not present

## 2017-06-29 DIAGNOSIS — Y929 Unspecified place or not applicable: Secondary | ICD-10-CM | POA: Insufficient documentation

## 2017-06-29 DIAGNOSIS — J449 Chronic obstructive pulmonary disease, unspecified: Secondary | ICD-10-CM | POA: Insufficient documentation

## 2017-06-29 DIAGNOSIS — M25512 Pain in left shoulder: Secondary | ICD-10-CM | POA: Diagnosis not present

## 2017-06-29 DIAGNOSIS — E039 Hypothyroidism, unspecified: Secondary | ICD-10-CM | POA: Insufficient documentation

## 2017-06-29 DIAGNOSIS — M25522 Pain in left elbow: Secondary | ICD-10-CM | POA: Diagnosis not present

## 2017-06-29 DIAGNOSIS — N183 Chronic kidney disease, stage 3 (moderate): Secondary | ICD-10-CM | POA: Diagnosis not present

## 2017-06-29 DIAGNOSIS — J45909 Unspecified asthma, uncomplicated: Secondary | ICD-10-CM | POA: Diagnosis not present

## 2017-06-29 DIAGNOSIS — S4992XA Unspecified injury of left shoulder and upper arm, initial encounter: Secondary | ICD-10-CM | POA: Diagnosis present

## 2017-06-29 DIAGNOSIS — I129 Hypertensive chronic kidney disease with stage 1 through stage 4 chronic kidney disease, or unspecified chronic kidney disease: Secondary | ICD-10-CM | POA: Diagnosis not present

## 2017-06-29 DIAGNOSIS — Y939 Activity, unspecified: Secondary | ICD-10-CM | POA: Insufficient documentation

## 2017-06-29 DIAGNOSIS — Y999 Unspecified external cause status: Secondary | ICD-10-CM | POA: Diagnosis not present

## 2017-06-29 DIAGNOSIS — W1830XA Fall on same level, unspecified, initial encounter: Secondary | ICD-10-CM | POA: Diagnosis not present

## 2017-06-29 DIAGNOSIS — I10 Essential (primary) hypertension: Secondary | ICD-10-CM | POA: Diagnosis not present

## 2017-06-29 DIAGNOSIS — S59902A Unspecified injury of left elbow, initial encounter: Secondary | ICD-10-CM | POA: Diagnosis not present

## 2017-06-29 LAB — CBC WITH DIFFERENTIAL/PLATELET
BASOS ABS: 0 10*3/uL (ref 0–0.1)
Basophils Relative: 1 %
Eosinophils Absolute: 0.2 10*3/uL (ref 0–0.7)
Eosinophils Relative: 3 %
HEMATOCRIT: 36.8 % (ref 35.0–47.0)
Hemoglobin: 12 g/dL (ref 12.0–16.0)
LYMPHS ABS: 1.5 10*3/uL (ref 1.0–3.6)
LYMPHS PCT: 22 %
MCH: 30 pg (ref 26.0–34.0)
MCHC: 32.6 g/dL (ref 32.0–36.0)
MCV: 91.9 fL (ref 80.0–100.0)
MONO ABS: 0.6 10*3/uL (ref 0.2–0.9)
MONOS PCT: 8 %
NEUTROS ABS: 4.7 10*3/uL (ref 1.4–6.5)
Neutrophils Relative %: 66 %
Platelets: 239 10*3/uL (ref 150–440)
RBC: 4.01 MIL/uL (ref 3.80–5.20)
RDW: 15.1 % — ABNORMAL HIGH (ref 11.5–14.5)
WBC: 7.1 10*3/uL (ref 3.6–11.0)

## 2017-06-29 LAB — BASIC METABOLIC PANEL
Anion gap: 9 (ref 5–15)
BUN: 30 mg/dL — AB (ref 6–20)
CHLORIDE: 106 mmol/L (ref 101–111)
CO2: 26 mmol/L (ref 22–32)
Calcium: 9.3 mg/dL (ref 8.9–10.3)
Creatinine, Ser: 1.19 mg/dL — ABNORMAL HIGH (ref 0.44–1.00)
GFR calc Af Amer: 46 mL/min — ABNORMAL LOW (ref >60)
GFR calc non Af Amer: 40 mL/min — ABNORMAL LOW (ref >60)
GLUCOSE: 142 mg/dL — AB (ref 65–99)
POTASSIUM: 4.4 mmol/L (ref 3.5–5.1)
Sodium: 141 mmol/L (ref 135–145)

## 2017-06-29 MED ORDER — OXYCODONE-ACETAMINOPHEN 5-325 MG PO TABS
1.0000 | ORAL_TABLET | Freq: Once | ORAL | Status: AC
  Administered 2017-06-29: 1 via ORAL
  Filled 2017-06-29: qty 1

## 2017-06-29 MED ORDER — OXYCODONE-ACETAMINOPHEN 5-325 MG PO TABS: 1.0000 | ORAL_TABLET | Freq: Four times a day (QID) | ORAL | 0 refills | Status: DC | PRN

## 2017-06-29 NOTE — ED Provider Notes (Signed)
[facility] Emergency Department Provider Note   ____________________________________________   First MD Initiated Contact with Patient 06/29/17 1941     (approximate)  I have reviewed the triage vital signs and the nursing notes.   HISTORY  Chief Complaint Fall and Shoulder Pain (Left)    HPI Diamond Newman is a 84 y.o. female patient reports she was in her bathroom when she lost her balance and fell hitting her shoulder. She denies hitting her head she did not pass out she has no nausea vomiting headache or any other problems. She does not have any neck pain. Shoulders are tender on palpation. Pulses present in the wrist patient denies any numbness or weakness in the hand.   Past Medical History:  Diagnosis Date  . Asthma   . Chronic bronchitis (HCC)   . COPD (chronic obstructive pulmonary disease) (HCC)    uses inhalers  . Depression   . Diabetes mellitus (HCC)   . GERD (gastroesophageal reflux disease)    hiatal hernia  . Gout   . Hematuria    followed by urology  . History of colon polyps   . History of frequent urinary tract infections   . Humeral fracture 9/09   comminuted impacted proximal  . Hypercholesterolemia   . Hypertension   . Hypothyroidism   . Peripheral vascular disease (HCC)   . Sleep apnea    does not use cpap as it did not benefit her    Patient Active Problem List   Diagnosis Date Noted  . Sleep apnea 09/27/2016  . Bronchitis 07/06/2016  . Acute respiratory failure with hypoxia (HCC) 07/06/2016  . Daytime somnolence 08/03/2015  . Fall against object 12/30/2014  . CKD (chronic kidney disease) stage 3, GFR 30-59 ml/min (HCC) 11/24/2014  . Abnormal mammogram 01/25/2014  . Muscular deconditioning 12/03/2013  . Leukocytosis 11/27/2013  . Obesity (BMI 30.0-34.9) 10/28/2013  . Chronic obstructive airway disease with asthma (HCC) 08/14/2013  . Lower extremity edema 08/05/2013  . Cough 04/22/2013  . Disorder of kidney  and ureter 07/28/2012  . Urinary urgency 07/28/2012  . Vitamin B12 deficiency 07/18/2012  . Osteopenia 05/27/2012  . Vitamin D deficiency 05/27/2012  . Hypertension 05/25/2012  . Hypercholesterolemia 05/25/2012  . Peripheral vascular disease (HCC) 05/25/2012  . History of frequent urinary tract infections 05/25/2012  . GERD (gastroesophageal reflux disease) 05/25/2012  . Diabetes mellitus with peripheral vascular disease (HCC) 05/25/2012  . Gout 05/25/2012  . Incontinence of feces 06/03/2011  . Mixed urge and stress incontinence 03/22/2011  . Atrophic vaginitis 03/22/2011    Past Surgical History:  Procedure Laterality Date  . BLADDER SUSPENSION  12/08/99  . BREAST BIOPSY Right 1960's  . CARPAL TUNNEL RELEASE Right 03/17/2017   Procedure: CARPAL TUNNEL RELEASE;  Surgeon: Kennedy BuckerMenz, Michael, MD;  Location: [facility] ORS;  Service: Orthopedics;  Laterality: Right;  . CHOLECYSTECTOMY  1994  . COLONOSCOPY W/ POLYPECTOMY    . EYE SURGERY Bilateral    cataract extractions  . KNEE SURGERY Right 2013   arthroscopy  . ORIF WRIST FRACTURE Right 03/17/2017   Procedure: OPEN REDUCTION INTERNAL FIXATION (ORIF) WRIST FRACTURE;  Surgeon: Kennedy BuckerMenz, Michael, MD;  Location: [facility] ORS;  Service: Orthopedics;  Laterality: Right;    Prior to Admission medications   Medication Sig Start Date End Date Taking? Authorizing Provider  ADVAIR DISKUS 250-50 MCG/DOSE AEPB Inhale 1 puff into the lungs 2 (two) times daily. 06/03/17  Yes Dale DurhamScott, Charlene, MD  allopurinol (ZYLOPRIM) 100 MG tablet Take  1 tablet (100 mg total) by mouth daily. 04/22/17  Yes Dale [place], MD  Calcium Carbonate-Vitamin D (CALCIUM 600+D) 600-400 MG-UNIT per tablet Take 1 tablet by mouth daily.    Yes [provider]  cetirizine (ZYRTEC) 10 MG tablet Take 10 mg by mouth daily.   Yes [provider]  Cholecalciferol (VITAMIN D-3) 1000 UNITS CAPS Take 1 capsule by mouth daily.   Yes [provider]    Cranberry-Cholecalciferol 4200-500 MG-UNIT CAPS Take 1 capsule by mouth daily.    Yes [provider]  feeding supplement, ENSURE ENLIVE, (ENSURE ENLIVE) LIQD Take 237 mLs by mouth 2 (two) times daily between meals. 07/09/16  Yes Gouru, Deanna Artis, MD  FLUoxetine (PROZAC) 20 MG capsule Take 1 capsule (20 mg total) by mouth daily. 05/11/17  Yes Dale [place], MD  levothyroxine (SYNTHROID, LEVOTHROID) 50 MCG tablet Take 1 tablet (50 mcg total) by mouth daily. 05/25/17  Yes Dale [place], MD  methenamine (HIPREX) 1 g tablet Take 1 tablet (1 g total) by mouth daily. 02/09/17  Yes Dale [place], MD  montelukast (SINGULAIR) 10 MG tablet Take 1 tablet (10 mg total) by mouth at bedtime. 09/13/16  Yes Dale [place], MD  nystatin cream (MYCOSTATIN) Apply 1 application 2 (two) times daily topically. 04/11/17  Yes Dale [place], MD  pantoprazole (PROTONIX) 40 MG tablet Take 1 tablet (40 mg total) by mouth daily. 05/11/17  Yes Dale Tumacacori-Carmen, MD  simvastatin (ZOCOR) 20 MG tablet Take 1 tablet (20 mg total) by mouth daily. Patient taking differently: Take 1 tablet (20 mg total) by mouth daily, in the evening 09/13/16  Yes Dale [place], MD  tolterodine (DETROL) 2 MG tablet Take 1 tablet (2 mg total) by mouth 2 (two) times daily. 04/21/17  Yes Dale Graham, MD  VENTOLIN HFA 108 (90 Base) MCG/ACT inhaler Inhale 2 puffs into the lungs every 6 (six) hours as needed for wheezi ng or shortness of breath. 07/26/16  Yes Dale [place], MD  nystatin (NYSTATIN) powder Apply topically 2 (two) times daily. Patient not taking: Reported on 06/29/2017 03/08/17   Dale [place], MD  oxyCODONE-acetaminophen (PERCOCET/ROXICET) 5-325 MG tablet Take 1 tablet by mouth every 6 (six) hours as needed for severe pain. 06/29/17   Arnaldo Natal, MD    Allergies Ace inhibitors; Amoxicillin; Ciprofloxacin hcl; Codeine; Sulfa antibiotics; Tetracyclines & related; and Vibramycin [doxycycline calcium]  Family History  Problem  Relation Age of Onset  . Hypertension Father   . CVA Father   . CAD Father   . Hypertension Mother   . Colitis Mother   . CAD Mother   . Breast cancer Neg Hx   . Colon cancer Neg Hx     Social History Social History   Tobacco Use  . Smoking status: Never Smoker  . Smokeless tobacco: Never Used  Substance Use Topics  . Alcohol use: No    Alcohol/week: 0.0 oz  . Drug use: No    Review of Systems  Constitutional: No fever/chills Eyes: No visual changes. ENT: No sore throat. Cardiovascular: Denies chest pain. Respiratory: Denies shortness of breath. Gastrointestinal: No abdominal pain.  No nausea, no vomiting.  No diarrhea.  No constipation. Genitourinary: Negative for dysuria. Musculoskeletal: Negative for back pain. Skin: Negative for rash. Neurological: Negative for headaches, focal weakness  ____________________________________________   PHYSICAL EXAM:  VITAL SIGNS: ED Triage Vitals  Enc Vitals Group     BP      Pulse      Resp  Temp      Temp src      SpO2      Weight      Height      Head Circumference      Peak Flow      Pain Score      Pain Loc      Pain Edu?      Excl. in GC?     Constitutional: Alert and oriented. Well appearing and in no acute distress. Eyes: Conjunctivae are normal.  Head: Atraumatic. Nose: No congestion/rhinnorhea. Mouth/Throat: Mucous membranes are moist.  Oropharynx non-erythematous. Neck: No stridor.   Cardiovascular: Normal rate, regular rhythm. Grossly normal heart sounds.  Good peripheral circulation. Respiratory: Normal respiratory effort.  No retractions. Lungs CTAB. Gastrointestinal: Soft and nontender. No distention. No abdominal bruits. No CVA tenderness. }Musculoskeletal: No lower extremity tenderness nor edema.patient does have a lot of pain in the left shoulder and seems to have little bit of pain in the left elbow as well. Neurologic:  Normal speech and language. No gross focal neurologic deficits are  appreciated. No gait instability. Skin:  Skin is warm, dry and intact. No rash noted. Psychiatric: Mood and affect are normal. Speech and behavior are normal.  ____________________________________________   LABS (all labs ordered are listed, but only abnormal results are displayed)  Labs Reviewed  BASIC METABOLIC PANEL - Abnormal; Notable for the following components:      Result Value   Glucose, Bld 142 (*)    BUN 30 (*)    Creatinine, Ser 1.19 (*)    GFR calc non Af Amer 40 (*)    GFR calc Af Amer 46 (*)    All other components within normal limits  CBC WITH DIFFERENTIAL/PLATELET - Abnormal; Notable for the following components:   RDW 15.1 (*)    All other components within normal limits   ____________________________________________  EKG  EKG shows multifocal P waves at a rate of 98.left axis no acute changes ____________________________________________  RADIOLOGY  ED MD interpretation:  comminuted head and very humerus fracture. There is no other fractures that I can see.  Official radiology report(s): Dg Elbow Complete Left  Result Date: 06/29/2017 CLINICAL DATA:  Larey Seat in bathroom onto LEFT shoulder and elbow, lost her balance, pain EXAM: LEFT ELBOW - COMPLETE 3+ VIEW COMPARISON:  12/16/2009 FINDINGS: Osseous demineralization. Joint spaces preserved. No acute fracture, dislocation, or bone destruction. Proximal radial fracture seen on the previous exam no longer identified. No elbow joint effusion. IMPRESSION: No acute osseous abnormalities. Electronically Signed   By: Ulyses Southward M.D.   On: 06/29/2017 20:25   Dg Shoulder Left  Result Date: 06/29/2017 CLINICAL DATA:  Larey Seat, pain EXAM: LEFT SHOULDER - 2+ VIEW FINDINGS: Comminuted LEFT humeral head fracture. No dislocation. Slight avulsion of the greater tuberosity. Clavicle and acromion appear intact. Osteopenia. IMPRESSION: Comminuted LEFT humeral head fracture without dislocation. Electronically Signed   By: Elsie Stain  M.D.   On: 06/29/2017 20:24    ____________________________________________   PROCEDURES  Procedure(s) performed:   Procedures  Critical Care performed:   ____________________________________________   INITIAL IMPRESSION / ASSESSMENT AND PLAN / ED COURSE           ____________________________________________   FINAL CLINICAL IMPRESSION(S) / ED DIAGNOSES  Final diagnoses:  Closed fracture of proximal end of left humerus, unspecified fracture morphology, initial encounter     ED Discharge Orders        Ordered    oxyCODONE-acetaminophen (PERCOCET/ROXICET) 5-325 MG tablet  Every 6 hours PRN     06/29/17 2144       Note:  This document was prepared using Dragon voice recognition software and may include unintentional dictation errors.    Arnaldo Natal, MD 06/29/17 2146

## 2017-06-29 NOTE — Discharge Instructions (Signed)
Please call Dr.Poggi the orthopedic surgeon. Telemetry office that you were seen in the emergency room for shoulder fracture and you have a shoulder immobilizer on. They should be able to get you an appointment in about a week. Use the Percocet 1 pill 4 times a day as needed for pain. Be very careful the Percocet can make you woozy. I do not want you to fall again. Do not drive on the Percocet. The Percocet can also make you constipated. Please return for any weakness or numbness in the hand or if your hand gets blue or white or cold.

## 2017-06-29 NOTE — ED Notes (Signed)
Patient transported to X-ray 

## 2017-06-29 NOTE — ED Triage Notes (Signed)
Per EMS, pt fell in bathroom falling onto left shoulder. Pt reports she "lost her balance", denies LOC or head inj. EMS reports giving of fentanyl IM with improvement to pain. Pt able to move hand and fingers, pulse intact. Pt states increased pain when moving left shoulder. Pt A&O at this time, able to answer questions.

## 2017-07-01 ENCOUNTER — Other Ambulatory Visit: Payer: Self-pay | Admitting: Internal Medicine

## 2017-07-01 ENCOUNTER — Encounter: Payer: Self-pay | Admitting: Internal Medicine

## 2017-07-01 NOTE — Telephone Encounter (Signed)
Copied from CRM [id number]. Topic: Referral - Status >> Jul 01, 2017  2:03 PM Arlyss Gandyichardson, Taren N, NT wrote: Reason for CRM: Clydie BraunKaren from [facility] calling to check on getting a referral for this pt for home health. Pt fell last week and was in the emergency room and d/c with no home health. Family would like therapy for her due to pt not doing well since she fell. CB#: [phone]

## 2017-07-02 ENCOUNTER — Telehealth: Payer: Self-pay | Admitting: Internal Medicine

## 2017-07-02 NOTE — Telephone Encounter (Signed)
My chart message sent to pt (pts daughter), for update.

## 2017-07-02 NOTE — Telephone Encounter (Signed)
I called and spoke to pts daughter Diamond Newman(Diamond Newman).  Discussed the fall and care after.  Discussed physical therapy.  She has an appt with ortho Wednesday.  Would like ortho's recommendations (given she has a fracture).  Daughter was in agreement.  Please notify Clydie BraunKaren that daughter was comfortable with waiting until the appt with ortho and then seeing what they recommended regarding physical therapy.   Let me know if any problems or questions.

## 2017-07-04 NOTE — Telephone Encounter (Signed)
Called Diamond Newman. Unable to leave message due to mail box being full.

## 2017-07-04 NOTE — Telephone Encounter (Signed)
Spoke with Clydie BraunKaren to let her know they are wanting to wait until eval with ortho to decide about physical therapy

## 2017-07-04 NOTE — Telephone Encounter (Signed)
Last OV 03/2017  Last refill 01/2017 #30 with 2 refills.  Ok to fill?

## 2017-07-04 NOTE — Telephone Encounter (Signed)
Clydie BraunKaren from [facility] is calling back, requesting call back [phone]

## 2017-07-05 NOTE — Telephone Encounter (Signed)
ok'd rx for methenamine.

## 2017-07-06 DIAGNOSIS — S42295A Other nondisplaced fracture of upper end of left humerus, initial encounter for closed fracture: Secondary | ICD-10-CM | POA: Diagnosis not present

## 2017-07-06 DIAGNOSIS — W1830XA Fall on same level, unspecified, initial encounter: Secondary | ICD-10-CM | POA: Diagnosis not present

## 2017-07-07 ENCOUNTER — Other Ambulatory Visit: Payer: Self-pay | Admitting: Internal Medicine

## 2017-07-08 MED ORDER — TOLTERODINE TARTRATE 2 MG PO TABS: 2.0000 mg | ORAL_TABLET | Freq: Two times a day (BID) | ORAL | 0 refills | Status: DC

## 2017-07-09 DIAGNOSIS — S52501D Unspecified fracture of the lower end of right radius, subsequent encounter for closed fracture with routine healing: Secondary | ICD-10-CM | POA: Diagnosis not present

## 2017-07-09 DIAGNOSIS — S52102D Unspecified fracture of upper end of left radius, subsequent encounter for closed fracture with routine healing: Secondary | ICD-10-CM | POA: Diagnosis not present

## 2017-07-09 DIAGNOSIS — I10 Essential (primary) hypertension: Secondary | ICD-10-CM | POA: Diagnosis not present

## 2017-07-09 DIAGNOSIS — I739 Peripheral vascular disease, unspecified: Secondary | ICD-10-CM | POA: Diagnosis not present

## 2017-07-09 DIAGNOSIS — S42295D Other nondisplaced fracture of upper end of left humerus, subsequent encounter for fracture with routine healing: Secondary | ICD-10-CM | POA: Diagnosis not present

## 2017-07-09 DIAGNOSIS — Z9181 History of falling: Secondary | ICD-10-CM | POA: Diagnosis not present

## 2017-07-11 DIAGNOSIS — S42295D Other nondisplaced fracture of upper end of left humerus, subsequent encounter for fracture with routine healing: Secondary | ICD-10-CM | POA: Diagnosis not present

## 2017-07-11 DIAGNOSIS — S52501D Unspecified fracture of the lower end of right radius, subsequent encounter for closed fracture with routine healing: Secondary | ICD-10-CM | POA: Diagnosis not present

## 2017-07-11 DIAGNOSIS — I10 Essential (primary) hypertension: Secondary | ICD-10-CM | POA: Diagnosis not present

## 2017-07-11 DIAGNOSIS — Z9181 History of falling: Secondary | ICD-10-CM | POA: Diagnosis not present

## 2017-07-11 DIAGNOSIS — S52102D Unspecified fracture of upper end of left radius, subsequent encounter for closed fracture with routine healing: Secondary | ICD-10-CM | POA: Diagnosis not present

## 2017-07-11 DIAGNOSIS — I739 Peripheral vascular disease, unspecified: Secondary | ICD-10-CM | POA: Diagnosis not present

## 2017-07-12 ENCOUNTER — Other Ambulatory Visit: Payer: Self-pay | Admitting: Internal Medicine

## 2017-07-12 DIAGNOSIS — S52501D Unspecified fracture of the lower end of right radius, subsequent encounter for closed fracture with routine healing: Secondary | ICD-10-CM | POA: Diagnosis not present

## 2017-07-12 DIAGNOSIS — Z9181 History of falling: Secondary | ICD-10-CM | POA: Diagnosis not present

## 2017-07-12 DIAGNOSIS — S42295D Other nondisplaced fracture of upper end of left humerus, subsequent encounter for fracture with routine healing: Secondary | ICD-10-CM | POA: Diagnosis not present

## 2017-07-12 DIAGNOSIS — S52102D Unspecified fracture of upper end of left radius, subsequent encounter for closed fracture with routine healing: Secondary | ICD-10-CM | POA: Diagnosis not present

## 2017-07-12 DIAGNOSIS — I739 Peripheral vascular disease, unspecified: Secondary | ICD-10-CM | POA: Diagnosis not present

## 2017-07-12 DIAGNOSIS — I10 Essential (primary) hypertension: Secondary | ICD-10-CM | POA: Diagnosis not present

## 2017-07-13 MED ORDER — FLUOXETINE HCL 20 MG PO CAPS: 20.0000 mg | ORAL_CAPSULE | Freq: Every day | ORAL | 0 refills | Status: DC

## 2017-07-13 MED ORDER — PANTOPRAZOLE SODIUM 40 MG PO TBEC: 40.0000 mg | DELAYED_RELEASE_TABLET | Freq: Every day | ORAL | 0 refills | Status: DC

## 2017-07-15 DIAGNOSIS — S42295D Other nondisplaced fracture of upper end of left humerus, subsequent encounter for fracture with routine healing: Secondary | ICD-10-CM | POA: Diagnosis not present

## 2017-07-15 DIAGNOSIS — I739 Peripheral vascular disease, unspecified: Secondary | ICD-10-CM | POA: Diagnosis not present

## 2017-07-15 DIAGNOSIS — Z9181 History of falling: Secondary | ICD-10-CM | POA: Diagnosis not present

## 2017-07-15 DIAGNOSIS — S52501D Unspecified fracture of the lower end of right radius, subsequent encounter for closed fracture with routine healing: Secondary | ICD-10-CM | POA: Diagnosis not present

## 2017-07-15 DIAGNOSIS — I10 Essential (primary) hypertension: Secondary | ICD-10-CM | POA: Diagnosis not present

## 2017-07-15 DIAGNOSIS — S52102D Unspecified fracture of upper end of left radius, subsequent encounter for closed fracture with routine healing: Secondary | ICD-10-CM | POA: Diagnosis not present

## 2017-07-17 ENCOUNTER — Other Ambulatory Visit: Payer: Self-pay | Admitting: Internal Medicine

## 2017-07-18 MED ORDER — FLUTICASONE-SALMETEROL 250-50 MCG/DOSE IN AEPB: 1.0000 | INHALATION_SPRAY | Freq: Two times a day (BID) | RESPIRATORY_TRACT | 0 refills | Status: DC

## 2017-07-18 MED ORDER — LEVOTHYROXINE SODIUM 50 MCG PO TABS: 50.0000 ug | ORAL_TABLET | Freq: Every day | ORAL | 0 refills | Status: DC

## 2017-07-19 DIAGNOSIS — I10 Essential (primary) hypertension: Secondary | ICD-10-CM | POA: Diagnosis not present

## 2017-07-19 DIAGNOSIS — I739 Peripheral vascular disease, unspecified: Secondary | ICD-10-CM | POA: Diagnosis not present

## 2017-07-19 DIAGNOSIS — S52501D Unspecified fracture of the lower end of right radius, subsequent encounter for closed fracture with routine healing: Secondary | ICD-10-CM | POA: Diagnosis not present

## 2017-07-19 DIAGNOSIS — S42295D Other nondisplaced fracture of upper end of left humerus, subsequent encounter for fracture with routine healing: Secondary | ICD-10-CM | POA: Diagnosis not present

## 2017-07-19 DIAGNOSIS — Z9181 History of falling: Secondary | ICD-10-CM | POA: Diagnosis not present

## 2017-07-19 DIAGNOSIS — S52102D Unspecified fracture of upper end of left radius, subsequent encounter for closed fracture with routine healing: Secondary | ICD-10-CM | POA: Diagnosis not present

## 2017-07-20 ENCOUNTER — Ambulatory Visit: Payer: Medicare Other

## 2017-07-20 DIAGNOSIS — I10 Essential (primary) hypertension: Secondary | ICD-10-CM | POA: Diagnosis not present

## 2017-07-20 DIAGNOSIS — S52501D Unspecified fracture of the lower end of right radius, subsequent encounter for closed fracture with routine healing: Secondary | ICD-10-CM | POA: Diagnosis not present

## 2017-07-20 DIAGNOSIS — I739 Peripheral vascular disease, unspecified: Secondary | ICD-10-CM | POA: Diagnosis not present

## 2017-07-20 DIAGNOSIS — Z9181 History of falling: Secondary | ICD-10-CM | POA: Diagnosis not present

## 2017-07-20 DIAGNOSIS — S52102D Unspecified fracture of upper end of left radius, subsequent encounter for closed fracture with routine healing: Secondary | ICD-10-CM | POA: Diagnosis not present

## 2017-07-20 DIAGNOSIS — S42295D Other nondisplaced fracture of upper end of left humerus, subsequent encounter for fracture with routine healing: Secondary | ICD-10-CM | POA: Diagnosis not present

## 2017-07-22 ENCOUNTER — Other Ambulatory Visit: Payer: Self-pay | Admitting: Internal Medicine

## 2017-07-22 DIAGNOSIS — S42295D Other nondisplaced fracture of upper end of left humerus, subsequent encounter for fracture with routine healing: Secondary | ICD-10-CM | POA: Diagnosis not present

## 2017-07-22 DIAGNOSIS — I10 Essential (primary) hypertension: Secondary | ICD-10-CM | POA: Diagnosis not present

## 2017-07-22 DIAGNOSIS — S52501D Unspecified fracture of the lower end of right radius, subsequent encounter for closed fracture with routine healing: Secondary | ICD-10-CM | POA: Diagnosis not present

## 2017-07-22 DIAGNOSIS — S52102D Unspecified fracture of upper end of left radius, subsequent encounter for closed fracture with routine healing: Secondary | ICD-10-CM | POA: Diagnosis not present

## 2017-07-22 DIAGNOSIS — Z9181 History of falling: Secondary | ICD-10-CM | POA: Diagnosis not present

## 2017-07-22 DIAGNOSIS — I739 Peripheral vascular disease, unspecified: Secondary | ICD-10-CM | POA: Diagnosis not present

## 2017-07-25 DIAGNOSIS — I739 Peripheral vascular disease, unspecified: Secondary | ICD-10-CM | POA: Diagnosis not present

## 2017-07-25 DIAGNOSIS — S52102D Unspecified fracture of upper end of left radius, subsequent encounter for closed fracture with routine healing: Secondary | ICD-10-CM | POA: Diagnosis not present

## 2017-07-25 DIAGNOSIS — S42295D Other nondisplaced fracture of upper end of left humerus, subsequent encounter for fracture with routine healing: Secondary | ICD-10-CM | POA: Diagnosis not present

## 2017-07-25 DIAGNOSIS — S52501D Unspecified fracture of the lower end of right radius, subsequent encounter for closed fracture with routine healing: Secondary | ICD-10-CM | POA: Diagnosis not present

## 2017-07-25 DIAGNOSIS — Z9181 History of falling: Secondary | ICD-10-CM | POA: Diagnosis not present

## 2017-07-25 DIAGNOSIS — I10 Essential (primary) hypertension: Secondary | ICD-10-CM | POA: Diagnosis not present

## 2017-07-25 MED ORDER — LEVOTHYROXINE SODIUM 50 MCG PO TABS: 50.0000 ug | ORAL_TABLET | Freq: Every day | ORAL | 4 refills | Status: DC

## 2017-07-27 ENCOUNTER — Encounter: Payer: Self-pay | Admitting: Internal Medicine

## 2017-07-27 DIAGNOSIS — S52102D Unspecified fracture of upper end of left radius, subsequent encounter for closed fracture with routine healing: Secondary | ICD-10-CM | POA: Diagnosis not present

## 2017-07-27 DIAGNOSIS — Z9181 History of falling: Secondary | ICD-10-CM | POA: Diagnosis not present

## 2017-07-27 DIAGNOSIS — I739 Peripheral vascular disease, unspecified: Secondary | ICD-10-CM | POA: Diagnosis not present

## 2017-07-27 DIAGNOSIS — S42295D Other nondisplaced fracture of upper end of left humerus, subsequent encounter for fracture with routine healing: Secondary | ICD-10-CM | POA: Diagnosis not present

## 2017-07-27 DIAGNOSIS — S52501D Unspecified fracture of the lower end of right radius, subsequent encounter for closed fracture with routine healing: Secondary | ICD-10-CM | POA: Diagnosis not present

## 2017-07-27 DIAGNOSIS — I10 Essential (primary) hypertension: Secondary | ICD-10-CM | POA: Diagnosis not present

## 2017-07-28 ENCOUNTER — Ambulatory Visit (INDEPENDENT_AMBULATORY_CARE_PROVIDER_SITE_OTHER): Payer: Medicare Other | Admitting: Internal Medicine

## 2017-07-28 ENCOUNTER — Encounter: Payer: Self-pay | Admitting: Internal Medicine

## 2017-07-28 VITALS — BP 110/62 | HR 97 | Temp 98.7°F

## 2017-07-28 DIAGNOSIS — I739 Peripheral vascular disease, unspecified: Secondary | ICD-10-CM | POA: Diagnosis not present

## 2017-07-28 DIAGNOSIS — S52501D Unspecified fracture of the lower end of right radius, subsequent encounter for closed fracture with routine healing: Secondary | ICD-10-CM | POA: Diagnosis not present

## 2017-07-28 DIAGNOSIS — S42295D Other nondisplaced fracture of upper end of left humerus, subsequent encounter for fracture with routine healing: Secondary | ICD-10-CM | POA: Diagnosis not present

## 2017-07-28 DIAGNOSIS — J449 Chronic obstructive pulmonary disease, unspecified: Secondary | ICD-10-CM

## 2017-07-28 DIAGNOSIS — R3915 Urgency of urination: Secondary | ICD-10-CM | POA: Diagnosis not present

## 2017-07-28 DIAGNOSIS — S52102D Unspecified fracture of upper end of left radius, subsequent encounter for closed fracture with routine healing: Secondary | ICD-10-CM | POA: Diagnosis not present

## 2017-07-28 DIAGNOSIS — J4489 Other specified chronic obstructive pulmonary disease: Secondary | ICD-10-CM

## 2017-07-28 DIAGNOSIS — I10 Essential (primary) hypertension: Secondary | ICD-10-CM | POA: Diagnosis not present

## 2017-07-28 DIAGNOSIS — Z9181 History of falling: Secondary | ICD-10-CM | POA: Diagnosis not present

## 2017-07-28 LAB — URINALYSIS, ROUTINE W REFLEX MICROSCOPIC
Bilirubin Urine: NEGATIVE
KETONES UR: NEGATIVE
NITRITE: NEGATIVE
PH: 6 (ref 5.0–8.0)
Specific Gravity, Urine: 1.01 (ref 1.000–1.030)
TOTAL PROTEIN, URINE-UPE24: NEGATIVE
URINE GLUCOSE: NEGATIVE
UROBILINOGEN UA: 0.2 (ref 0.0–1.0)

## 2017-07-28 MED ORDER — CEFDINIR 300 MG PO CAPS: 300.0000 mg | ORAL_CAPSULE | Freq: Two times a day (BID) | ORAL | 0 refills | Status: DC

## 2017-07-28 MED ORDER — PREDNISONE 20 MG PO TABS: 40.0000 mg | ORAL_TABLET | Freq: Every day | ORAL | 0 refills | Status: DC

## 2017-07-28 NOTE — Progress Notes (Signed)
Pre visit review using our clinic review tool, if applicable. No additional management support is needed unless otherwise documented below in the visit note. 

## 2017-07-28 NOTE — Patient Instructions (Signed)
Use Albuterol inhaler 1-2 puffs every 4-6 hours with Advair  Use prednisone 40 mg daily in am x 1 week  Cefdinir 300 mg 2x per day 1 week  Call back if needed   Chronic Obstructive Pulmonary Disease Exacerbation Chronic obstructive pulmonary disease (COPD) is a common lung problem. In COPD, the flow of air from the lungs is limited. COPD exacerbations are times that breathing gets worse and you need extra treatment. Without treatment they can be life threatening. If they happen often, your lungs can become more damaged. If your COPD gets worse, your doctor may treat you with:  Medicines.  Oxygen.  Different ways to clear your airway, such as using a mask.  Follow these instructions at home:  Do not smoke.  Avoid tobacco smoke and other things that bother your lungs.  If given, take your antibiotic medicine as told. Finish the medicine even if you start to feel better.  Only take medicines as told by your doctor.  Drink enough fluids to keep your pee (urine) clear or pale yellow (unless your doctor has told you not to).  Use a cool mist machine (vaporizer).  If you use oxygen or a machine that turns liquid medicine into a mist (nebulizer), continue to use them as told.  Keep up with shots (vaccinations) as told by your doctor.  Exercise regularly.  Eat healthy foods.  Keep all doctor visits as told. Get help right away if:  You are very short of breath and it gets worse.  You have trouble talking.  You have bad chest pain.  You have blood in your spit (sputum).  You have a fever.  You keep throwing up (vomiting).  You feel weak, or you pass out (faint).  You feel confused.  You keep getting worse. This information is not intended to replace advice given to you by your health care provider. Make sure you discuss any questions you have with your health care provider. Document Released: 04/29/2011 Document Revised: 10/16/2015 Document Reviewed:  01/12/2013 Elsevier Interactive Patient Education  2017 [employer]

## 2017-07-28 NOTE — Addendum Note (Signed)
Addended by: Penne LashWIGGINS, Kendalynn Wideman N on: 07/28/2017 02:54 PM   Modules accepted: Orders

## 2017-07-28 NOTE — Progress Notes (Signed)
Chief Complaint  Patient presents with  . Urinary Tract Infection  . Cough    COPD   Sick visit w/ daughter who gives most of history  1. C/o urinary urgency w/o dysuria, frequency. She also reports when she goes she will have urgency but not urinate much. She does wear depends and only changes 1x per day. Sxs x 1 day  2. Daughter also reports pt more tired and sleepy and when working with [facility] sx's and with hx rec she be checked for UTI.  Daughter reports pt has had reduced water and po intake 3. Pt also with cough, congestion w/o fever with some productive sputum green tried Mucinex OTC and also Advair inhaler but not really using albuterol inhaler with h/o COPD.    Review of Systems  Constitutional: Negative for fever.  HENT: Negative for hearing loss.   Respiratory: Positive for cough, sputum production, shortness of breath and wheezing.   Cardiovascular: Negative for chest pain.  Gastrointestinal: Negative for abdominal pain.  Genitourinary: Positive for urgency. Negative for dysuria and frequency.  Musculoskeletal:       +left shoulder fracture   Skin: Negative for rash.   Past Medical History:  Diagnosis Date  . Asthma   . Chronic bronchitis ([place])   . COPD (chronic obstructive pulmonary disease) ([facility])    uses inhalers  . Depression   . Diabetes mellitus ([place])   . GERD (gastroesophageal reflux disease)    hiatal hernia  . Gout   . Hematuria    followed by urology  . History of colon polyps   . History of frequent urinary tract infections   . Humeral fracture 9/09   comminuted impacted proximal  . Hypercholesterolemia   . Hypertension   . Hypothyroidism   . Peripheral vascular disease ([place])   . Sleep apnea    does not use cpap as it did not benefit her   Past Surgical History:  Procedure Laterality Date  . BLADDER SUSPENSION  12/08/99  . BREAST BIOPSY Right 1960's  . CARPAL TUNNEL RELEASE Right 03/17/2017   Procedure: CARPAL TUNNEL RELEASE;  Surgeon: Hessie Knows, MD;  Location: [facility] ORS;  Service: Orthopedics;  Laterality: Right;  . CHOLECYSTECTOMY  1994  . COLONOSCOPY W/ POLYPECTOMY    . EYE SURGERY Bilateral    cataract extractions  . KNEE SURGERY Right 2013   arthroscopy  . ORIF WRIST FRACTURE Right 03/17/2017   Procedure: OPEN REDUCTION INTERNAL FIXATION (ORIF) WRIST FRACTURE;  Surgeon: Hessie Knows, MD;  Location: [facility] ORS;  Service: Orthopedics;  Laterality: Right;   Family History  Problem Relation Age of Onset  . Hypertension Father   . CVA Father   . CAD Father   . Hypertension Mother   . Colitis Mother   . CAD Mother   . Breast cancer Neg Hx   . Colon cancer Neg Hx    Social History   Socioeconomic History  . Marital status: Widowed    Spouse name: Not on file  . Number of children: 2  . Years of education: Not on file  . Highest education level: Not on file  Social Needs  . Financial resource strain: Not on file  . Food insecurity - worry: Not on file  . Food insecurity - inability: Not on file  . Transportation needs - medical: Not on file  . Transportation needs - non-medical: Not on file  Occupational History  . Not on file  Tobacco Use  . Smoking status: Never  Smoker  . Smokeless tobacco: Never Used  Substance and Sexual Activity  . Alcohol use: No    Alcohol/week: 0.0 oz  . Drug use: No  . Sexual activity: No  Other Topics Concern  . Not on file  Social History Narrative  . Not on file   Current Meds  Medication Sig  . allopurinol (ZYLOPRIM) 100 MG tablet Take 1 tablet (100 mg total) by mouth daily.  . Calcium Carbonate-Vitamin D (CALCIUM 600+D) 600-400 MG-UNIT per tablet Take 1 tablet by mouth daily.   . cetirizine (ZYRTEC) 10 MG tablet Take 10 mg by mouth daily.  . Cholecalciferol (VITAMIN D-3) 1000 UNITS CAPS Take 1 capsule by mouth daily.  . Cranberry-Cholecalciferol 4200-500 MG-UNIT CAPS Take 1 capsule by mouth daily.   . feeding supplement, ENSURE ENLIVE, (ENSURE ENLIVE) LIQD Take 237  mLs by mouth 2 (two) times daily between meals.  Marland Kitchen FLUoxetine (PROZAC) 20 MG capsule Take 1 capsule (20 mg total) by mouth daily.  . Fluticasone-Salmeterol (ADVAIR DISKUS) 250-50 MCG/DOSE AEPB Inhale 1 puff into the lungs 2 (two) times daily.  Marland Kitchen levothyroxine (SYNTHROID, LEVOTHROID) 50 MCG tablet Take 1 tablet (50 mcg total) by mouth daily before breakfast.  . methenamine (HIPREX) 1 g tablet Take 1 tablet (1 g total) by mouth daily.  . montelukast (SINGULAIR) 10 MG tablet Take 1 tablet (10 mg total) by mouth at bedtime.  Marland Kitchen nystatin (NYSTATIN) powder Apply topically 2 (two) times daily.  Marland Kitchen nystatin cream (MYCOSTATIN) Apply 1 application 2 (two) times daily topically.  Marland Kitchen oxyCODONE-acetaminophen (PERCOCET/ROXICET) 5-325 MG tablet Take 1 tablet by mouth every 6 (six) hours as needed for severe pain.  . pantoprazole (PROTONIX) 40 MG tablet Take 1 tablet (40 mg total) by mouth daily.  . simvastatin (ZOCOR) 20 MG tablet Take 1 tablet (20 mg total) by mouth daily. (Patient taking differently: Take 1 tablet (20 mg total) by mouth daily, in the evening)  . tolterodine (DETROL) 2 MG tablet Take 1 tablet (2 mg total) by mouth 2 (two) times daily.  . VENTOLIN HFA 108 (90 Base) MCG/ACT inhaler Inhale 2 puffs into the lungs every 6 (six) hours as needed for wheezi ng or shortness of breath.   Allergies  Allergen Reactions  . Ace Inhibitors Cough  . Amoxicillin Rash  . Ciprofloxacin Hcl Rash  . Codeine Rash  . Sulfa Antibiotics Other (See Comments)    GI upset  . Tetracyclines & Related Rash  . Vibramycin [Doxycycline Calcium] Rash   Recent Results (from the past 2160 hour(s))  Basic metabolic panel     Status: Abnormal   Collection Time: 05/05/17  9:28 AM  Result Value Ref Range   Sodium 141 135 - 145 mEq/L   Potassium 4.5 3.5 - 5.1 mEq/L   Chloride 104 96 - 112 mEq/L   CO2 30 19 - 32 mEq/L   Glucose, Bld 131 (H) 70 - 99 mg/dL   BUN 24 (H) 6 - 23 mg/dL   Creatinine, Ser 1.28 (H) 0.40 - 1.20 mg/dL    Calcium 9.3 8.4 - 10.5 mg/dL   GFR 41.78 (L) >60.00 mL/min  Lipid panel     Status: Abnormal   Collection Time: 05/05/17  9:28 AM  Result Value Ref Range   Cholesterol 129 0 - 200 mg/dL    Comment: ATP III Classification       Desirable:  < 200 mg/dL               Borderline High:  200 - 239 mg/dL          High:  > = 240 mg/dL   Triglycerides 162.0 (H) 0.0 - 149.0 mg/dL    Comment: Normal:  <150 mg/dLBorderline High:  150 - 199 mg/dL   HDL 42.10 >39.00 mg/dL   VLDL 32.4 0.0 - 40.0 mg/dL   LDL Cholesterol 54 0 - 99 mg/dL   Total CHOL/HDL Ratio 3     Comment:                Men          Women1/2 Average Risk     3.4          3.3Average Risk          5.0          4.42X Average Risk          9.6          7.13X Average Risk          15.0          11.0                       NonHDL 86.42     Comment: NOTE:  Non-HDL goal should be 30 mg/dL higher than patient's LDL goal (i.e. LDL goal of < 70 mg/dL, would have non-HDL goal of < 100 mg/dL)  Hepatic function panel     Status: None   Collection Time: 05/05/17  9:28 AM  Result Value Ref Range   Total Bilirubin 0.6 0.2 - 1.2 mg/dL   Bilirubin, Direct 0.1 0.0 - 0.3 mg/dL   Alkaline Phosphatase 59 39 - 117 U/L   AST 18 0 - 37 U/L   ALT 13 0 - 35 U/L   Total Protein 6.7 6.0 - 8.3 g/dL   Albumin 3.7 3.5 - 5.2 g/dL  Hemoglobin A1c     Status: Abnormal   Collection Time: 05/05/17  9:28 AM  Result Value Ref Range   Hgb A1c MFr Bld 6.7 (H) 4.6 - 6.5 %    Comment: Glycemic Control Guidelines for People with Diabetes:Non Diabetic:  <6%Goal of Therapy: <7%Additional Action Suggested:  >8%   Ferritin     Status: None   Collection Time: 05/05/17  9:28 AM  Result Value Ref Range   Ferritin 15.6 10.0 - 291.0 ng/mL  CBC with Differential/Platelet     Status: Abnormal   Collection Time: 05/05/17  9:28 AM  Result Value Ref Range   WBC 8.3 4.0 - 10.5 K/uL   RBC 4.12 3.87 - 5.11 Mil/uL   Hemoglobin 12.4 12.0 - 15.0 g/dL   HCT 38.9 36.0 - 46.0 %   MCV  94.6 78.0 - 100.0 fl   MCHC 31.8 30.0 - 36.0 g/dL   RDW 15.9 (H) 11.5 - 15.5 %   Platelets 275.0 150.0 - 400.0 K/uL   Neutrophils Relative % 56.0 43.0 - 77.0 %   Lymphocytes Relative 31.7 12.0 - 46.0 %   Monocytes Relative 7.8 3.0 - 12.0 %   Eosinophils Relative 4.0 0.0 - 5.0 %   Basophils Relative 0.5 0.0 - 3.0 %   Neutro Abs 4.6 1.4 - 7.7 K/uL   Lymphs Abs 2.6 0.7 - 4.0 K/uL   Monocytes Absolute 0.6 0.1 - 1.0 K/uL   Eosinophils Absolute 0.3 0.0 - 0.7 K/uL   Basophils Absolute 0.0 0.0 - 0.1 K/uL  Basic metabolic panel     Status: Abnormal  Collection Time: 06/29/17  8:23 PM  Result Value Ref Range   Sodium 141 135 - 145 mmol/L   Potassium 4.4 3.5 - 5.1 mmol/L   Chloride 106 101 - 111 mmol/L   CO2 26 22 - 32 mmol/L   Glucose, Bld 142 (H) 65 - 99 mg/dL   BUN 30 (H) 6 - 20 mg/dL   Creatinine, Ser 1.19 (H) 0.44 - 1.00 mg/dL   Calcium 9.3 8.9 - 10.3 mg/dL   GFR calc non Af Amer 40 (L) >60 mL/min   GFR calc Af Amer 46 (L) >60 mL/min    Comment: (NOTE) The eGFR has been calculated using the CKD EPI equation. This calculation has not been validated in all clinical situations. eGFR's persistently <60 mL/min signify possible Chronic Kidney Disease.    Anion gap 9 5 - 15    Comment: Performed at [facility], [address] [place], [place] [address]  CBC with Differential     Status: Abnormal   Collection Time: 06/29/17  8:23 PM  Result Value Ref Range   WBC 7.1 3.6 - 11.0 K/uL   RBC 4.01 3.80 - 5.20 MIL/uL   Hemoglobin 12.0 12.0 - 16.0 g/dL   HCT 36.8 35.0 - 47.0 %   MCV 91.9 80.0 - 100.0 fL   MCH 30.0 26.0 - 34.0 pg   MCHC 32.6 32.0 - 36.0 g/dL   RDW 15.1 (H) 11.5 - 14.5 %   Platelets 239 150 - 440 K/uL   Neutrophils Relative % 66 %   Neutro Abs 4.7 1.4 - 6.5 K/uL   Lymphocytes Relative 22 %   Lymphs Abs 1.5 1.0 - 3.6 K/uL   Monocytes Relative 8 %   Monocytes Absolute 0.6 0.2 - 0.9 K/uL   Eosinophils Relative 3 %   Eosinophils Absolute 0.2 0 - 0.7 K/uL    Basophils Relative 1 %   Basophils Absolute 0.0 0 - 0.1 K/uL    Comment: Performed at [facility], [address] [place], [place] [address]   Objective  There is no height or weight on file to calculate BMI. Wt Readings from Last 3 Encounters:  06/29/17 190 lb (86.2 kg)  04/11/17 192 lb 6.4 oz (87.3 kg)  03/17/17 194 lb (88 kg)   Temp Readings from Last 3 Encounters:  07/28/17 98.7 F (37.1 C) (Oral)  06/29/17 97.9 F (36.6 C) (Oral)  04/11/17 (!) 97.5 F (36.4 C) (Oral)   BP Readings from Last 3 Encounters:  07/28/17 110/62  06/29/17 (!) 158/80  04/14/17 120/74   Pulse Readings from Last 3 Encounters:  07/28/17 97  06/29/17 94  04/11/17 100   O2 sat room air 93% Physical Exam  Constitutional: She is oriented to person, place, and time and well-developed, well-nourished, and in no distress. Vital signs are normal.  HENT:  Head: Normocephalic and atraumatic.  Mouth/Throat: Oropharynx is clear and moist and mucous membranes are normal.  Eyes: Conjunctivae are normal. Pupils are equal, round, and reactive to light.  Cardiovascular: Normal rate, regular rhythm and normal heart sounds.  Pulmonary/Chest: Effort normal. She has wheezes.  +course breath sounds b/l   Abdominal: There is no tenderness.  Neurological: She is alert and oriented to person, place, and time. Gait normal.  In wheelchair today   Skin: Skin is warm, dry and intact.  Psychiatric: Mood, memory, affect and judgment normal.  Nursing note and vitals reviewed.   Assessment   1. C/w UTI  2. COPD/bronchitis exacerbation  Plan  1. Pt to bring back UA and culture before starting Abx Allergic to a lot of meds will try Cefdinir given #1 and 2 call if side effects  Increase water intake  2. Prednisone, rec use albuterol prn, advair she is using bid, add cefdinir and cont mucinex  F/u with PCP as sch 3/19 Provider: Dr. Olivia Mackie McLean-Scocuzza-Internal Medicine

## 2017-07-31 ENCOUNTER — Encounter: Payer: Self-pay | Admitting: Internal Medicine

## 2017-07-31 LAB — URINE CULTURE
MICRO NUMBER:: 90294379
SPECIMEN QUALITY: ADEQUATE

## 2017-08-01 DIAGNOSIS — S52501D Unspecified fracture of the lower end of right radius, subsequent encounter for closed fracture with routine healing: Secondary | ICD-10-CM | POA: Diagnosis not present

## 2017-08-01 DIAGNOSIS — S52102D Unspecified fracture of upper end of left radius, subsequent encounter for closed fracture with routine healing: Secondary | ICD-10-CM | POA: Diagnosis not present

## 2017-08-01 DIAGNOSIS — S42295D Other nondisplaced fracture of upper end of left humerus, subsequent encounter for fracture with routine healing: Secondary | ICD-10-CM | POA: Diagnosis not present

## 2017-08-01 DIAGNOSIS — I10 Essential (primary) hypertension: Secondary | ICD-10-CM | POA: Diagnosis not present

## 2017-08-01 DIAGNOSIS — Z9181 History of falling: Secondary | ICD-10-CM | POA: Diagnosis not present

## 2017-08-01 DIAGNOSIS — I739 Peripheral vascular disease, unspecified: Secondary | ICD-10-CM | POA: Diagnosis not present

## 2017-08-04 DIAGNOSIS — Z9181 History of falling: Secondary | ICD-10-CM | POA: Diagnosis not present

## 2017-08-04 DIAGNOSIS — I739 Peripheral vascular disease, unspecified: Secondary | ICD-10-CM | POA: Diagnosis not present

## 2017-08-04 DIAGNOSIS — I10 Essential (primary) hypertension: Secondary | ICD-10-CM | POA: Diagnosis not present

## 2017-08-04 DIAGNOSIS — S52102D Unspecified fracture of upper end of left radius, subsequent encounter for closed fracture with routine healing: Secondary | ICD-10-CM | POA: Diagnosis not present

## 2017-08-04 DIAGNOSIS — S42295D Other nondisplaced fracture of upper end of left humerus, subsequent encounter for fracture with routine healing: Secondary | ICD-10-CM | POA: Diagnosis not present

## 2017-08-04 DIAGNOSIS — S52501D Unspecified fracture of the lower end of right radius, subsequent encounter for closed fracture with routine healing: Secondary | ICD-10-CM | POA: Diagnosis not present

## 2017-08-08 DIAGNOSIS — S42295D Other nondisplaced fracture of upper end of left humerus, subsequent encounter for fracture with routine healing: Secondary | ICD-10-CM | POA: Diagnosis not present

## 2017-08-08 DIAGNOSIS — I739 Peripheral vascular disease, unspecified: Secondary | ICD-10-CM | POA: Diagnosis not present

## 2017-08-08 DIAGNOSIS — Z9181 History of falling: Secondary | ICD-10-CM | POA: Diagnosis not present

## 2017-08-08 DIAGNOSIS — S52501D Unspecified fracture of the lower end of right radius, subsequent encounter for closed fracture with routine healing: Secondary | ICD-10-CM | POA: Diagnosis not present

## 2017-08-08 DIAGNOSIS — S52102D Unspecified fracture of upper end of left radius, subsequent encounter for closed fracture with routine healing: Secondary | ICD-10-CM | POA: Diagnosis not present

## 2017-08-08 DIAGNOSIS — I10 Essential (primary) hypertension: Secondary | ICD-10-CM | POA: Diagnosis not present

## 2017-08-08 DIAGNOSIS — M25512 Pain in left shoulder: Secondary | ICD-10-CM | POA: Diagnosis not present

## 2017-08-08 DIAGNOSIS — M25542 Pain in joints of left hand: Secondary | ICD-10-CM | POA: Diagnosis not present

## 2017-08-08 DIAGNOSIS — M65351 Trigger finger, right little finger: Secondary | ICD-10-CM | POA: Diagnosis not present

## 2017-08-09 ENCOUNTER — Encounter: Payer: Self-pay | Admitting: Internal Medicine

## 2017-08-09 ENCOUNTER — Ambulatory Visit (INDEPENDENT_AMBULATORY_CARE_PROVIDER_SITE_OTHER): Payer: Medicare Other | Admitting: Internal Medicine

## 2017-08-09 DIAGNOSIS — N183 Chronic kidney disease, stage 3 unspecified: Secondary | ICD-10-CM

## 2017-08-09 DIAGNOSIS — E669 Obesity, unspecified: Secondary | ICD-10-CM

## 2017-08-09 DIAGNOSIS — I739 Peripheral vascular disease, unspecified: Secondary | ICD-10-CM | POA: Diagnosis not present

## 2017-08-09 DIAGNOSIS — E559 Vitamin D deficiency, unspecified: Secondary | ICD-10-CM

## 2017-08-09 DIAGNOSIS — I10 Essential (primary) hypertension: Secondary | ICD-10-CM | POA: Diagnosis not present

## 2017-08-09 DIAGNOSIS — E66811 Obesity, class 1: Secondary | ICD-10-CM

## 2017-08-09 DIAGNOSIS — E538 Deficiency of other specified B group vitamins: Secondary | ICD-10-CM | POA: Diagnosis not present

## 2017-08-09 DIAGNOSIS — E1151 Type 2 diabetes mellitus with diabetic peripheral angiopathy without gangrene: Secondary | ICD-10-CM | POA: Diagnosis not present

## 2017-08-09 DIAGNOSIS — J449 Chronic obstructive pulmonary disease, unspecified: Secondary | ICD-10-CM

## 2017-08-09 DIAGNOSIS — J4489 Other specified chronic obstructive pulmonary disease: Secondary | ICD-10-CM

## 2017-08-09 DIAGNOSIS — E7800 Pure hypercholesterolemia, unspecified: Secondary | ICD-10-CM | POA: Diagnosis not present

## 2017-08-09 DIAGNOSIS — K219 Gastro-esophageal reflux disease without esophagitis: Secondary | ICD-10-CM

## 2017-08-09 MED ORDER — PANTOPRAZOLE SODIUM 40 MG PO TBEC: 40.0000 mg | DELAYED_RELEASE_TABLET | Freq: Every day | ORAL | 3 refills | Status: DC

## 2017-08-09 MED ORDER — FLUTICASONE-SALMETEROL 250-50 MCG/DOSE IN AEPB: 1.0000 | INHALATION_SPRAY | Freq: Two times a day (BID) | RESPIRATORY_TRACT | 3 refills | Status: DC

## 2017-08-09 MED ORDER — TOLTERODINE TARTRATE 2 MG PO TABS: 2.0000 mg | ORAL_TABLET | Freq: Two times a day (BID) | ORAL | 3 refills | Status: DC

## 2017-08-09 MED ORDER — NYSTATIN 100000 UNIT/ML MT SUSP: 5.0000 mL | Freq: Three times a day (TID) | OROMUCOSAL | 0 refills | Status: DC

## 2017-08-09 MED ORDER — FLUOXETINE HCL 20 MG PO CAPS: 20.0000 mg | ORAL_CAPSULE | Freq: Every day | ORAL | 3 refills | Status: DC

## 2017-08-09 NOTE — Progress Notes (Signed)
[name], female   DOB: 11/01/1926, 84 y.o.   MRN: [id number]   Subjective:    [name], female    DOB: August 12, 1926, 84 y.o.   MRN: [id number]  HPI  Patient here for a scheduled follow up.  She is accompanied by her daughter.  History obtained from both of them.  She was evaluated by Dr Aundra Dubin 07/28/17.  Diagnosed with UTI and was treated for COPD exacerbation.  Took abx, prednisone and using her inahalers.  Breathing is better.  Cough is better.  Eating .  No nausea or vomiting.  Bowels moving.  No urine change.  Is s/p left shoulder injury.  Diagnosed with closed nondisplaced fracture of proximal end of left humerus.  Seeing ortho.  Wearing a splint.  Planning to start therapy.  Is s/p wrist injection.  Does report a coating on her tongue.  Has been on prednisone and abx.  Also using inhalers.  Making her tongue uncomfortable.     Past Medical History:  Diagnosis Date  . Asthma   . Chronic bronchitis ([place])   . COPD (chronic obstructive pulmonary disease) (HCC)    uses inhalers  . Depression   . Diabetes mellitus ([place])   . GERD (gastroesophageal reflux disease)    hiatal hernia  . Gout   . Hematuria    followed by urology  . History of colon polyps   . History of frequent urinary tract infections   . Humeral fracture 9/09   comminuted impacted proximal  . Hypercholesterolemia   . Hypertension   . Hypothyroidism   . Peripheral vascular disease ([place])   . Sleep apnea    does not use cpap as it did not benefit her   Past Surgical History:  Procedure Laterality Date  . BLADDER SUSPENSION  12/08/99  . BREAST BIOPSY Right 1960's  . CARPAL TUNNEL RELEASE Right 03/17/2017   Procedure: CARPAL TUNNEL RELEASE;  Surgeon: Hessie Knows, MD;  Location: [facility] ORS;  Service: Orthopedics;  Laterality: Right;  . CHOLECYSTECTOMY  1994  . COLONOSCOPY W/ POLYPECTOMY    . EYE SURGERY Bilateral    cataract extractions  . KNEE SURGERY Right 2013   arthroscopy  . ORIF  WRIST FRACTURE Right 03/17/2017   Procedure: OPEN REDUCTION INTERNAL FIXATION (ORIF) WRIST FRACTURE;  Surgeon: Hessie Knows, MD;  Location: [facility] ORS;  Service: Orthopedics;  Laterality: Right;   Family History  Problem Relation Age of Onset  . Hypertension Father   . CVA Father   . CAD Father   . Hypertension Mother   . Colitis Mother   . CAD Mother   . Breast cancer Neg Hx   . Colon cancer Neg Hx    Social History   Socioeconomic History  . Marital status: Widowed    Spouse name: Not on file  . Number of children: 2  . Years of education: Not on file  . Highest education level: Not on file  Occupational History  . Not on file  Social Needs  . Financial resource strain: Not on file  . Food insecurity:    Worry: Not on file    Inability: Not on file  . Transportation needs:    Medical: Not on file    Non-medical: Not on file  Tobacco Use  . Smoking status: Never Smoker  . Smokeless tobacco: Never Used  Substance and Sexual Activity  . Alcohol use: No    Alcohol/week: 0.0 oz  . Drug use:  No  . Sexual activity: Never  Lifestyle  . Physical activity:    Days per week: Not on file    Minutes per session: Not on file  . Stress: Not on file  Relationships  . Social connections:    Talks on phone: Not on file    Gets together: Not on file    Attends religious service: Not on file    Active member of club or organization: Not on file    Attends meetings of clubs or organizations: Not on file    Relationship status: Not on file  Other Topics Concern  . Not on file  Social History Narrative  . Not on file    Outpatient Encounter Medications as of 08/09/2017  Medication Sig  . allopurinol (ZYLOPRIM) 100 MG tablet Take 1 tablet (100 mg total) by mouth daily.  . Calcium Carbonate-Vitamin D (CALCIUM 600+D) 600-400 MG-UNIT per tablet Take 1 tablet by mouth daily.   . cetirizine (ZYRTEC) 10 MG tablet Take 10 mg by mouth daily.  . Cholecalciferol (VITAMIN D-3) 1000 UNITS  CAPS Take 1 capsule by mouth daily.  . Cranberry-Cholecalciferol 4200-500 MG-UNIT CAPS Take 1 capsule by mouth daily.   Marland Kitchen FLUoxetine (PROZAC) 20 MG capsule Take 1 capsule (20 mg total) by mouth daily.  . Fluticasone-Salmeterol (ADVAIR DISKUS) 250-50 MCG/DOSE AEPB Inhale 1 puff into the lungs 2 (two) times daily.  Marland Kitchen levothyroxine (SYNTHROID, LEVOTHROID) 50 MCG tablet Take 1 tablet (50 mcg total) by mouth daily before breakfast.  . methenamine (HIPREX) 1 g tablet Take 1 tablet (1 g total) by mouth daily.  . montelukast (SINGULAIR) 10 MG tablet Take 1 tablet (10 mg total) by mouth at bedtime.  Marland Kitchen nystatin (NYSTATIN) powder Apply topically 2 (two) times daily.  Marland Kitchen nystatin cream (MYCOSTATIN) Apply 1 application 2 (two) times daily topically.  . pantoprazole (PROTONIX) 40 MG tablet Take 1 tablet (40 mg total) by mouth daily.  . simvastatin (ZOCOR) 20 MG tablet Take 1 tablet (20 mg total) by mouth daily. (Patient taking differently: Take 1 tablet (20 mg total) by mouth daily, in the evening)  . tolterodine (DETROL) 2 MG tablet Take 1 tablet (2 mg total) by mouth 2 (two) times daily.  . VENTOLIN HFA 108 (90 Base) MCG/ACT inhaler Inhale 2 puffs into the lungs every 6 (six) hours as needed for wheezi ng or shortness of breath.  . [DISCONTINUED] FLUoxetine (PROZAC) 20 MG capsule Take 1 capsule (20 mg total) by mouth daily.  . [DISCONTINUED] Fluticasone-Salmeterol (ADVAIR DISKUS) 250-50 MCG/DOSE AEPB Inhale 1 puff into the lungs 2 (two) times daily.  . [DISCONTINUED] pantoprazole (PROTONIX) 40 MG tablet Take 1 tablet (40 mg total) by mouth daily.  . [DISCONTINUED] tolterodine (DETROL) 2 MG tablet Take 1 tablet (2 mg total) by mouth 2 (two) times daily.  Marland Kitchen oxyCODONE-acetaminophen (PERCOCET/ROXICET) 5-325 MG tablet Take 1 tablet by mouth every 6 (six) hours as needed for severe pain. (Patient not taking: Reported on 08/09/2017)  . [DISCONTINUED] cefdinir (OMNICEF) 300 MG capsule Take 1 capsule (300 mg total) by  mouth 2 (two) times daily. With food  . [DISCONTINUED] feeding supplement, ENSURE ENLIVE, (ENSURE ENLIVE) LIQD Take 237 mLs by mouth 2 (two) times daily between meals.  . [DISCONTINUED] nystatin (MYCOSTATIN) 100000 UNIT/ML suspension Take 5 mLs (500,000 Units total) by mouth 3 (three) times daily.  . [DISCONTINUED] predniSONE (DELTASONE) 20 MG tablet Take 2 tablets (40 mg total) by mouth daily with breakfast.   No facility-administered encounter medications on file  as of 08/09/2017.     Review of Systems  Constitutional: Negative for appetite change and unexpected weight change.  HENT: Negative for congestion and sinus pressure.   Respiratory: Negative for chest tightness.        Cough and breathing better.    Cardiovascular: Negative for chest pain, palpitations and leg swelling.  Gastrointestinal: Negative for abdominal pain, diarrhea, nausea and vomiting.  Genitourinary: Negative for difficulty urinating and dysuria.  Musculoskeletal: Negative for joint swelling and myalgias.  Skin: Negative for color change and rash.  Neurological: Negative for dizziness, light-headedness and headaches.  Psychiatric/Behavioral: Negative for agitation and dysphoric mood.       Objective:    Physical Exam  Constitutional: She appears well-developed and well-nourished. No distress.  HENT:  Nose: Nose normal.  Mouth/Throat: Oropharynx is clear and moist.  Neck: Neck supple. No thyromegaly present.  Cardiovascular: Normal rate and regular rhythm.  Pulmonary/Chest: Breath sounds normal. No respiratory distress.  Abdominal: Soft. Bowel sounds are normal. There is no tenderness.  Musculoskeletal: She exhibits no edema or tenderness.  Lymphadenopathy:    She has no cervical adenopathy.  Skin: No rash noted. No erythema.  Psychiatric: She has a normal mood and affect. Her behavior is normal.    BP (!) 142/78 (BP Location: Right Arm, Patient Position: Sitting, Cuff Size: Large)   Pulse 100   Temp  98.2 F (36.8 C) (Oral)   Resp 18   Wt 188 lb (85.3 kg)   SpO2 97%   BMI 30.34 kg/m  Wt Readings from Last 3 Encounters:  08/09/17 188 lb (85.3 kg)  06/29/17 190 lb (86.2 kg)  04/11/17 192 lb 6.4 oz (87.3 kg)     Lab Results  Component Value Date   WBC 7.1 06/29/2017   HGB 12.0 06/29/2017   HCT 36.8 06/29/2017   PLT 239 06/29/2017   GLUCOSE 142 (H) 06/29/2017   CHOL 129 05/05/2017   TRIG 162.0 (H) 05/05/2017   HDL 42.10 05/05/2017   LDLCALC 54 05/05/2017   ALT 13 05/05/2017   AST 18 05/05/2017   NA 141 06/29/2017   K 4.4 06/29/2017   CL 106 06/29/2017   CREATININE 1.19 (H) 06/29/2017   BUN 30 (H) 06/29/2017   CO2 26 06/29/2017   TSH 2.69 12/31/2016   HGBA1C 6.7 (H) 05/05/2017   MICROALBUR 10.1 (H) 12/31/2016    Dg Elbow Complete Left  Result Date: 06/29/2017 CLINICAL DATA:  [place] in bathroom onto LEFT shoulder and elbow, lost her balance, pain EXAM: LEFT ELBOW - COMPLETE 3+ VIEW COMPARISON:  12/16/2009 FINDINGS: Osseous demineralization. Joint spaces preserved. No acute fracture, dislocation, or bone destruction. Proximal radial fracture seen on the previous exam no longer identified. No elbow joint effusion. IMPRESSION: No acute osseous abnormalities. Electronically Signed   By: Lavonia Dana M.D.   On: 06/29/2017 20:25   Dg Shoulder Left  Result Date: 06/29/2017 CLINICAL DATA:  [place], pain EXAM: LEFT SHOULDER - 2+ VIEW FINDINGS: Comminuted LEFT humeral head fracture. No dislocation. Slight avulsion of the greater tuberosity. Clavicle and acromion appear intact. Osteopenia. IMPRESSION: Comminuted LEFT humeral head fracture without dislocation. Electronically Signed   By: Staci Righter M.D.   On: 06/29/2017 20:24       Assessment & Plan:   Problem List Items Addressed This Visit    Chronic obstructive airway disease with asthma ([place])    Recently treated with abx and prednisone.  Breathing better.  Cough better.  Continue inhalers.  Follow.  Relevant Medications     Fluticasone-Salmeterol (ADVAIR DISKUS) 250-50 MCG/DOSE AEPB   CKD (chronic kidney disease) stage 3, GFR 30-59 ml/min (HCC)    Avoid antiinflammatories.  Follow met b.        Diabetes mellitus with peripheral vascular disease (HCC)    Low carb diet and exercise.  Follow met b and a1c.        Relevant Orders   Hemoglobin A1c   GERD (gastroesophageal reflux disease)    Controlled on protonix.  Follow.       Relevant Medications   pantoprazole (PROTONIX) 40 MG tablet   Hypercholesterolemia    On simvastatin.  Low cholesterol diet and exercise.  Follow lipid panel and liver function tests.        Relevant Orders   Hepatic function panel   Lipid panel   Hypertension    Blood pressure under good control.  Continue same medication regimen.  Follow pressures.  Follow metabolic panel.        Relevant Orders   Basic metabolic panel   Obesity (BMI 30.0-34.9)    Diet and exercise.  Follow.       Peripheral vascular disease (HCC)    Varying pressures in her arms.  Unchanged.  Continue risk factor modification.        Vitamin B12 deficiency    Continue b12 injections.        Vitamin D deficiency    Follow vitamin D level.            Einar Pheasant, MD

## 2017-08-10 DIAGNOSIS — S52102D Unspecified fracture of upper end of left radius, subsequent encounter for closed fracture with routine healing: Secondary | ICD-10-CM | POA: Diagnosis not present

## 2017-08-10 DIAGNOSIS — S52501D Unspecified fracture of the lower end of right radius, subsequent encounter for closed fracture with routine healing: Secondary | ICD-10-CM | POA: Diagnosis not present

## 2017-08-10 DIAGNOSIS — I10 Essential (primary) hypertension: Secondary | ICD-10-CM | POA: Diagnosis not present

## 2017-08-10 DIAGNOSIS — Z9181 History of falling: Secondary | ICD-10-CM | POA: Diagnosis not present

## 2017-08-10 DIAGNOSIS — S42295D Other nondisplaced fracture of upper end of left humerus, subsequent encounter for fracture with routine healing: Secondary | ICD-10-CM | POA: Diagnosis not present

## 2017-08-10 DIAGNOSIS — I739 Peripheral vascular disease, unspecified: Secondary | ICD-10-CM | POA: Diagnosis not present

## 2017-08-11 MED ORDER — NYSTATIN 100000 UNIT/ML MT SUSP: 5.0000 mL | Freq: Three times a day (TID) | OROMUCOSAL | 0 refills | Status: DC

## 2017-08-11 NOTE — Telephone Encounter (Signed)
rx sent in to [facility] for nystatin suspension as requested per my chart.

## 2017-08-12 MED ORDER — CLOTRIMAZOLE 10 MG MT TROC: 10.0000 mg | Freq: Three times a day (TID) | OROMUCOSAL | 0 refills | Status: DC

## 2017-08-12 NOTE — Telephone Encounter (Signed)
rx sent in for mycelex troches since nystatin not available.

## 2017-08-14 ENCOUNTER — Encounter: Payer: Self-pay | Admitting: Internal Medicine

## 2017-08-14 NOTE — Assessment & Plan Note (Signed)
Varying pressures in her arms.  Unchanged.  Continue risk factor modification.   

## 2017-08-14 NOTE — Assessment & Plan Note (Signed)
Follow vitamin D level.  

## 2017-08-14 NOTE — Assessment & Plan Note (Signed)
On simvastatin.  Low cholesterol diet and exercise.  Follow lipid panel and liver function tests.   

## 2017-08-14 NOTE — Assessment & Plan Note (Signed)
Diet and exercise.  Follow.  

## 2017-08-14 NOTE — Assessment & Plan Note (Signed)
Blood pressure under good control.  Continue same medication regimen.  Follow pressures.  Follow metabolic panel.   

## 2017-08-14 NOTE — Assessment & Plan Note (Signed)
Recently treated with abx and prednisone.  Breathing better.  Cough better.  Continue inhalers.  Follow.

## 2017-08-14 NOTE — Assessment & Plan Note (Signed)
Continue b12 injections.  

## 2017-08-14 NOTE — Assessment & Plan Note (Signed)
Low carb diet and exercise.  Follow met b and a1c.   

## 2017-08-14 NOTE — Assessment & Plan Note (Signed)
Controlled on protonix.  Follow.   

## 2017-08-14 NOTE — Assessment & Plan Note (Signed)
Avoid antiinflammatories.  Follow met b.

## 2017-08-15 DIAGNOSIS — I739 Peripheral vascular disease, unspecified: Secondary | ICD-10-CM | POA: Diagnosis not present

## 2017-08-15 DIAGNOSIS — S52102D Unspecified fracture of upper end of left radius, subsequent encounter for closed fracture with routine healing: Secondary | ICD-10-CM | POA: Diagnosis not present

## 2017-08-15 DIAGNOSIS — Z9181 History of falling: Secondary | ICD-10-CM | POA: Diagnosis not present

## 2017-08-15 DIAGNOSIS — I10 Essential (primary) hypertension: Secondary | ICD-10-CM | POA: Diagnosis not present

## 2017-08-15 DIAGNOSIS — S42295D Other nondisplaced fracture of upper end of left humerus, subsequent encounter for fracture with routine healing: Secondary | ICD-10-CM | POA: Diagnosis not present

## 2017-08-15 DIAGNOSIS — S52501D Unspecified fracture of the lower end of right radius, subsequent encounter for closed fracture with routine healing: Secondary | ICD-10-CM | POA: Diagnosis not present

## 2017-08-17 DIAGNOSIS — I739 Peripheral vascular disease, unspecified: Secondary | ICD-10-CM | POA: Diagnosis not present

## 2017-08-17 DIAGNOSIS — I10 Essential (primary) hypertension: Secondary | ICD-10-CM | POA: Diagnosis not present

## 2017-08-17 DIAGNOSIS — S52102D Unspecified fracture of upper end of left radius, subsequent encounter for closed fracture with routine healing: Secondary | ICD-10-CM | POA: Diagnosis not present

## 2017-08-17 DIAGNOSIS — S52501D Unspecified fracture of the lower end of right radius, subsequent encounter for closed fracture with routine healing: Secondary | ICD-10-CM | POA: Diagnosis not present

## 2017-08-17 DIAGNOSIS — S42295D Other nondisplaced fracture of upper end of left humerus, subsequent encounter for fracture with routine healing: Secondary | ICD-10-CM | POA: Diagnosis not present

## 2017-08-17 DIAGNOSIS — Z9181 History of falling: Secondary | ICD-10-CM | POA: Diagnosis not present

## 2017-08-22 DIAGNOSIS — S42295D Other nondisplaced fracture of upper end of left humerus, subsequent encounter for fracture with routine healing: Secondary | ICD-10-CM | POA: Diagnosis not present

## 2017-08-22 DIAGNOSIS — I10 Essential (primary) hypertension: Secondary | ICD-10-CM | POA: Diagnosis not present

## 2017-08-22 DIAGNOSIS — I739 Peripheral vascular disease, unspecified: Secondary | ICD-10-CM | POA: Diagnosis not present

## 2017-08-22 DIAGNOSIS — S52501D Unspecified fracture of the lower end of right radius, subsequent encounter for closed fracture with routine healing: Secondary | ICD-10-CM | POA: Diagnosis not present

## 2017-08-22 DIAGNOSIS — Z9181 History of falling: Secondary | ICD-10-CM | POA: Diagnosis not present

## 2017-08-22 DIAGNOSIS — S52102D Unspecified fracture of upper end of left radius, subsequent encounter for closed fracture with routine healing: Secondary | ICD-10-CM | POA: Diagnosis not present

## 2017-08-24 DIAGNOSIS — S52501D Unspecified fracture of the lower end of right radius, subsequent encounter for closed fracture with routine healing: Secondary | ICD-10-CM | POA: Diagnosis not present

## 2017-08-24 DIAGNOSIS — Z9181 History of falling: Secondary | ICD-10-CM | POA: Diagnosis not present

## 2017-08-24 DIAGNOSIS — I10 Essential (primary) hypertension: Secondary | ICD-10-CM | POA: Diagnosis not present

## 2017-08-24 DIAGNOSIS — S42295D Other nondisplaced fracture of upper end of left humerus, subsequent encounter for fracture with routine healing: Secondary | ICD-10-CM | POA: Diagnosis not present

## 2017-08-24 DIAGNOSIS — S52102D Unspecified fracture of upper end of left radius, subsequent encounter for closed fracture with routine healing: Secondary | ICD-10-CM | POA: Diagnosis not present

## 2017-08-24 DIAGNOSIS — I739 Peripheral vascular disease, unspecified: Secondary | ICD-10-CM | POA: Diagnosis not present

## 2017-08-26 ENCOUNTER — Other Ambulatory Visit: Payer: Self-pay

## 2017-08-26 MED ORDER — SIMVASTATIN 20 MG PO TABS: ORAL_TABLET | ORAL | 3 refills | Status: DC

## 2017-08-29 DIAGNOSIS — S42295D Other nondisplaced fracture of upper end of left humerus, subsequent encounter for fracture with routine healing: Secondary | ICD-10-CM | POA: Diagnosis not present

## 2017-08-29 DIAGNOSIS — I10 Essential (primary) hypertension: Secondary | ICD-10-CM | POA: Diagnosis not present

## 2017-08-29 DIAGNOSIS — Z9181 History of falling: Secondary | ICD-10-CM | POA: Diagnosis not present

## 2017-08-29 DIAGNOSIS — I739 Peripheral vascular disease, unspecified: Secondary | ICD-10-CM | POA: Diagnosis not present

## 2017-08-29 DIAGNOSIS — S52501D Unspecified fracture of the lower end of right radius, subsequent encounter for closed fracture with routine healing: Secondary | ICD-10-CM | POA: Diagnosis not present

## 2017-08-29 DIAGNOSIS — S52102D Unspecified fracture of upper end of left radius, subsequent encounter for closed fracture with routine healing: Secondary | ICD-10-CM | POA: Diagnosis not present

## 2017-08-31 DIAGNOSIS — S42295D Other nondisplaced fracture of upper end of left humerus, subsequent encounter for fracture with routine healing: Secondary | ICD-10-CM | POA: Diagnosis not present

## 2017-08-31 DIAGNOSIS — I739 Peripheral vascular disease, unspecified: Secondary | ICD-10-CM | POA: Diagnosis not present

## 2017-08-31 DIAGNOSIS — Z9181 History of falling: Secondary | ICD-10-CM | POA: Diagnosis not present

## 2017-08-31 DIAGNOSIS — S52102D Unspecified fracture of upper end of left radius, subsequent encounter for closed fracture with routine healing: Secondary | ICD-10-CM | POA: Diagnosis not present

## 2017-08-31 DIAGNOSIS — I10 Essential (primary) hypertension: Secondary | ICD-10-CM | POA: Diagnosis not present

## 2017-08-31 DIAGNOSIS — S52501D Unspecified fracture of the lower end of right radius, subsequent encounter for closed fracture with routine healing: Secondary | ICD-10-CM | POA: Diagnosis not present

## 2017-09-05 DIAGNOSIS — S42295D Other nondisplaced fracture of upper end of left humerus, subsequent encounter for fracture with routine healing: Secondary | ICD-10-CM | POA: Diagnosis not present

## 2017-09-06 DIAGNOSIS — I10 Essential (primary) hypertension: Secondary | ICD-10-CM | POA: Diagnosis not present

## 2017-09-06 DIAGNOSIS — S42295D Other nondisplaced fracture of upper end of left humerus, subsequent encounter for fracture with routine healing: Secondary | ICD-10-CM | POA: Diagnosis not present

## 2017-09-06 DIAGNOSIS — S52501D Unspecified fracture of the lower end of right radius, subsequent encounter for closed fracture with routine healing: Secondary | ICD-10-CM | POA: Diagnosis not present

## 2017-09-06 DIAGNOSIS — Z9181 History of falling: Secondary | ICD-10-CM | POA: Diagnosis not present

## 2017-09-06 DIAGNOSIS — I739 Peripheral vascular disease, unspecified: Secondary | ICD-10-CM | POA: Diagnosis not present

## 2017-09-06 DIAGNOSIS — S52102D Unspecified fracture of upper end of left radius, subsequent encounter for closed fracture with routine healing: Secondary | ICD-10-CM | POA: Diagnosis not present

## 2017-09-07 DIAGNOSIS — S52501D Unspecified fracture of the lower end of right radius, subsequent encounter for closed fracture with routine healing: Secondary | ICD-10-CM | POA: Diagnosis not present

## 2017-09-07 DIAGNOSIS — Z9181 History of falling: Secondary | ICD-10-CM | POA: Diagnosis not present

## 2017-09-07 DIAGNOSIS — I10 Essential (primary) hypertension: Secondary | ICD-10-CM | POA: Diagnosis not present

## 2017-09-07 DIAGNOSIS — S42295D Other nondisplaced fracture of upper end of left humerus, subsequent encounter for fracture with routine healing: Secondary | ICD-10-CM | POA: Diagnosis not present

## 2017-09-07 DIAGNOSIS — S52102D Unspecified fracture of upper end of left radius, subsequent encounter for closed fracture with routine healing: Secondary | ICD-10-CM | POA: Diagnosis not present

## 2017-09-07 DIAGNOSIS — I739 Peripheral vascular disease, unspecified: Secondary | ICD-10-CM | POA: Diagnosis not present

## 2017-09-12 ENCOUNTER — Ambulatory Visit (INDEPENDENT_AMBULATORY_CARE_PROVIDER_SITE_OTHER): Payer: Medicare Other

## 2017-09-12 DIAGNOSIS — E538 Deficiency of other specified B group vitamins: Secondary | ICD-10-CM | POA: Diagnosis not present

## 2017-09-12 DIAGNOSIS — S42292D Other displaced fracture of upper end of left humerus, subsequent encounter for fracture with routine healing: Secondary | ICD-10-CM | POA: Diagnosis not present

## 2017-09-12 DIAGNOSIS — M25512 Pain in left shoulder: Secondary | ICD-10-CM | POA: Diagnosis not present

## 2017-09-12 MED ORDER — CYANOCOBALAMIN 1000 MCG/ML IJ SOLN
1000.0000 ug | Freq: Once | INTRAMUSCULAR | Status: AC
  Administered 2017-09-12: 1000 ug via INTRAMUSCULAR

## 2017-09-12 NOTE — Progress Notes (Signed)
Patient came in today for monthly B12 injection. Given in Right Deltoid and tolerated well. No complaints or concerns.   Reviewed.  Dr Lorin PicketScott

## 2017-09-14 DIAGNOSIS — I10 Essential (primary) hypertension: Secondary | ICD-10-CM | POA: Diagnosis not present

## 2017-09-14 DIAGNOSIS — S52501D Unspecified fracture of the lower end of right radius, subsequent encounter for closed fracture with routine healing: Secondary | ICD-10-CM | POA: Diagnosis not present

## 2017-09-14 DIAGNOSIS — S52102D Unspecified fracture of upper end of left radius, subsequent encounter for closed fracture with routine healing: Secondary | ICD-10-CM | POA: Diagnosis not present

## 2017-09-14 DIAGNOSIS — S42295D Other nondisplaced fracture of upper end of left humerus, subsequent encounter for fracture with routine healing: Secondary | ICD-10-CM | POA: Diagnosis not present

## 2017-09-14 DIAGNOSIS — I739 Peripheral vascular disease, unspecified: Secondary | ICD-10-CM | POA: Diagnosis not present

## 2017-09-14 DIAGNOSIS — Z9181 History of falling: Secondary | ICD-10-CM | POA: Diagnosis not present

## 2017-09-16 DIAGNOSIS — I10 Essential (primary) hypertension: Secondary | ICD-10-CM | POA: Diagnosis not present

## 2017-09-16 DIAGNOSIS — Z9181 History of falling: Secondary | ICD-10-CM | POA: Diagnosis not present

## 2017-09-16 DIAGNOSIS — S42295D Other nondisplaced fracture of upper end of left humerus, subsequent encounter for fracture with routine healing: Secondary | ICD-10-CM | POA: Diagnosis not present

## 2017-09-16 DIAGNOSIS — I739 Peripheral vascular disease, unspecified: Secondary | ICD-10-CM | POA: Diagnosis not present

## 2017-09-16 DIAGNOSIS — S52102D Unspecified fracture of upper end of left radius, subsequent encounter for closed fracture with routine healing: Secondary | ICD-10-CM | POA: Diagnosis not present

## 2017-09-16 DIAGNOSIS — S52501D Unspecified fracture of the lower end of right radius, subsequent encounter for closed fracture with routine healing: Secondary | ICD-10-CM | POA: Diagnosis not present

## 2017-09-19 DIAGNOSIS — S52501D Unspecified fracture of the lower end of right radius, subsequent encounter for closed fracture with routine healing: Secondary | ICD-10-CM | POA: Diagnosis not present

## 2017-09-19 DIAGNOSIS — Z9181 History of falling: Secondary | ICD-10-CM | POA: Diagnosis not present

## 2017-09-19 DIAGNOSIS — S52102D Unspecified fracture of upper end of left radius, subsequent encounter for closed fracture with routine healing: Secondary | ICD-10-CM | POA: Diagnosis not present

## 2017-09-19 DIAGNOSIS — I10 Essential (primary) hypertension: Secondary | ICD-10-CM | POA: Diagnosis not present

## 2017-09-19 DIAGNOSIS — I739 Peripheral vascular disease, unspecified: Secondary | ICD-10-CM | POA: Diagnosis not present

## 2017-09-19 DIAGNOSIS — S42295D Other nondisplaced fracture of upper end of left humerus, subsequent encounter for fracture with routine healing: Secondary | ICD-10-CM | POA: Diagnosis not present

## 2017-09-21 DIAGNOSIS — I10 Essential (primary) hypertension: Secondary | ICD-10-CM | POA: Diagnosis not present

## 2017-09-21 DIAGNOSIS — I739 Peripheral vascular disease, unspecified: Secondary | ICD-10-CM | POA: Diagnosis not present

## 2017-09-21 DIAGNOSIS — S52501D Unspecified fracture of the lower end of right radius, subsequent encounter for closed fracture with routine healing: Secondary | ICD-10-CM | POA: Diagnosis not present

## 2017-09-21 DIAGNOSIS — Z9181 History of falling: Secondary | ICD-10-CM | POA: Diagnosis not present

## 2017-09-21 DIAGNOSIS — S52102D Unspecified fracture of upper end of left radius, subsequent encounter for closed fracture with routine healing: Secondary | ICD-10-CM | POA: Diagnosis not present

## 2017-09-21 DIAGNOSIS — S42295D Other nondisplaced fracture of upper end of left humerus, subsequent encounter for fracture with routine healing: Secondary | ICD-10-CM | POA: Diagnosis not present

## 2017-09-23 ENCOUNTER — Other Ambulatory Visit: Payer: Self-pay

## 2017-09-23 MED ORDER — ALLOPURINOL 100 MG PO TABS: ORAL_TABLET | ORAL | 2 refills | Status: DC

## 2017-09-26 DIAGNOSIS — S52501D Unspecified fracture of the lower end of right radius, subsequent encounter for closed fracture with routine healing: Secondary | ICD-10-CM | POA: Diagnosis not present

## 2017-09-26 DIAGNOSIS — S52102D Unspecified fracture of upper end of left radius, subsequent encounter for closed fracture with routine healing: Secondary | ICD-10-CM | POA: Diagnosis not present

## 2017-09-26 DIAGNOSIS — Z9181 History of falling: Secondary | ICD-10-CM | POA: Diagnosis not present

## 2017-09-26 DIAGNOSIS — I739 Peripheral vascular disease, unspecified: Secondary | ICD-10-CM | POA: Diagnosis not present

## 2017-09-26 DIAGNOSIS — I10 Essential (primary) hypertension: Secondary | ICD-10-CM | POA: Diagnosis not present

## 2017-09-26 DIAGNOSIS — S42295D Other nondisplaced fracture of upper end of left humerus, subsequent encounter for fracture with routine healing: Secondary | ICD-10-CM | POA: Diagnosis not present

## 2017-09-28 DIAGNOSIS — S52501D Unspecified fracture of the lower end of right radius, subsequent encounter for closed fracture with routine healing: Secondary | ICD-10-CM | POA: Diagnosis not present

## 2017-09-28 DIAGNOSIS — I10 Essential (primary) hypertension: Secondary | ICD-10-CM | POA: Diagnosis not present

## 2017-09-28 DIAGNOSIS — I739 Peripheral vascular disease, unspecified: Secondary | ICD-10-CM | POA: Diagnosis not present

## 2017-09-28 DIAGNOSIS — S52102D Unspecified fracture of upper end of left radius, subsequent encounter for closed fracture with routine healing: Secondary | ICD-10-CM | POA: Diagnosis not present

## 2017-09-28 DIAGNOSIS — Z9181 History of falling: Secondary | ICD-10-CM | POA: Diagnosis not present

## 2017-09-28 DIAGNOSIS — S42295D Other nondisplaced fracture of upper end of left humerus, subsequent encounter for fracture with routine healing: Secondary | ICD-10-CM | POA: Diagnosis not present

## 2017-10-04 DIAGNOSIS — I10 Essential (primary) hypertension: Secondary | ICD-10-CM | POA: Diagnosis not present

## 2017-10-04 DIAGNOSIS — S52501D Unspecified fracture of the lower end of right radius, subsequent encounter for closed fracture with routine healing: Secondary | ICD-10-CM | POA: Diagnosis not present

## 2017-10-04 DIAGNOSIS — S52102D Unspecified fracture of upper end of left radius, subsequent encounter for closed fracture with routine healing: Secondary | ICD-10-CM | POA: Diagnosis not present

## 2017-10-04 DIAGNOSIS — Z9181 History of falling: Secondary | ICD-10-CM | POA: Diagnosis not present

## 2017-10-04 DIAGNOSIS — I739 Peripheral vascular disease, unspecified: Secondary | ICD-10-CM | POA: Diagnosis not present

## 2017-10-04 DIAGNOSIS — S42295D Other nondisplaced fracture of upper end of left humerus, subsequent encounter for fracture with routine healing: Secondary | ICD-10-CM | POA: Diagnosis not present

## 2017-10-06 DIAGNOSIS — S52501D Unspecified fracture of the lower end of right radius, subsequent encounter for closed fracture with routine healing: Secondary | ICD-10-CM | POA: Diagnosis not present

## 2017-10-06 DIAGNOSIS — I10 Essential (primary) hypertension: Secondary | ICD-10-CM | POA: Diagnosis not present

## 2017-10-06 DIAGNOSIS — S52102D Unspecified fracture of upper end of left radius, subsequent encounter for closed fracture with routine healing: Secondary | ICD-10-CM | POA: Diagnosis not present

## 2017-10-06 DIAGNOSIS — I739 Peripheral vascular disease, unspecified: Secondary | ICD-10-CM | POA: Diagnosis not present

## 2017-10-06 DIAGNOSIS — S42295D Other nondisplaced fracture of upper end of left humerus, subsequent encounter for fracture with routine healing: Secondary | ICD-10-CM | POA: Diagnosis not present

## 2017-10-06 DIAGNOSIS — Z9181 History of falling: Secondary | ICD-10-CM | POA: Diagnosis not present

## 2017-10-12 ENCOUNTER — Ambulatory Visit (INDEPENDENT_AMBULATORY_CARE_PROVIDER_SITE_OTHER): Payer: Medicare Other | Admitting: *Deleted

## 2017-10-12 DIAGNOSIS — E538 Deficiency of other specified B group vitamins: Secondary | ICD-10-CM | POA: Diagnosis not present

## 2017-10-12 MED ORDER — CYANOCOBALAMIN 1000 MCG/ML IJ SOLN
1000.0000 ug | Freq: Once | INTRAMUSCULAR | Status: AC
Start: 2017-10-12 — End: 2017-10-12
  Administered 2017-10-12: 1000 ug via INTRAMUSCULAR

## 2017-10-12 NOTE — Progress Notes (Signed)
Patient presented for B 12 injection to right deltoid, patient voiced no concerns nor showed any signs of distress during injection. 

## 2017-10-21 ENCOUNTER — Other Ambulatory Visit (INDEPENDENT_AMBULATORY_CARE_PROVIDER_SITE_OTHER): Payer: Medicare Other

## 2017-10-21 DIAGNOSIS — E7800 Pure hypercholesterolemia, unspecified: Secondary | ICD-10-CM

## 2017-10-21 DIAGNOSIS — E1151 Type 2 diabetes mellitus with diabetic peripheral angiopathy without gangrene: Secondary | ICD-10-CM | POA: Diagnosis not present

## 2017-10-21 DIAGNOSIS — I10 Essential (primary) hypertension: Secondary | ICD-10-CM

## 2017-10-21 LAB — BASIC METABOLIC PANEL
BUN: 23 mg/dL (ref 6–23)
CHLORIDE: 104 meq/L (ref 96–112)
CO2: 28 meq/L (ref 19–32)
CREATININE: 1.17 mg/dL (ref 0.40–1.20)
Calcium: 9.8 mg/dL (ref 8.4–10.5)
GFR: 46.3 mL/min — ABNORMAL LOW (ref >60.00)
Glucose, Bld: 156 mg/dL — ABNORMAL HIGH (ref 70–99)
Potassium: 4.7 mEq/L (ref 3.5–5.1)
Sodium: 141 mEq/L (ref 135–145)

## 2017-10-21 LAB — LIPID PANEL
CHOL/HDL RATIO: 3
Cholesterol: 140 mg/dL (ref 0–200)
HDL: 41.7 mg/dL (ref >39.00)
LDL CALC: 69 mg/dL (ref 0–99)
NonHDL: 98.64
Triglycerides: 146 mg/dL (ref 0.0–149.0)
VLDL: 29.2 mg/dL (ref 0.0–40.0)

## 2017-10-21 LAB — HEMOGLOBIN A1C: HEMOGLOBIN A1C: 7.4 % — AB (ref 4.6–6.5)

## 2017-10-21 LAB — HEPATIC FUNCTION PANEL
ALT: 11 U/L (ref 0–35)
AST: 17 U/L (ref 0–37)
Albumin: 3.6 g/dL (ref 3.5–5.2)
Alkaline Phosphatase: 57 U/L (ref 39–117)
BILIRUBIN DIRECT: 0.1 mg/dL (ref 0.0–0.3)
BILIRUBIN TOTAL: 0.5 mg/dL (ref 0.2–1.2)
TOTAL PROTEIN: 6.9 g/dL (ref 6.0–8.3)

## 2017-10-23 ENCOUNTER — Encounter: Payer: Self-pay | Admitting: Internal Medicine

## 2017-10-24 ENCOUNTER — Ambulatory Visit (INDEPENDENT_AMBULATORY_CARE_PROVIDER_SITE_OTHER): Payer: Medicare Other | Admitting: Internal Medicine

## 2017-10-24 ENCOUNTER — Encounter: Payer: Self-pay | Admitting: Internal Medicine

## 2017-10-24 VITALS — BP 126/70 | HR 94 | Temp 97.6°F | Resp 18 | Wt 183.0 lb

## 2017-10-24 DIAGNOSIS — R05 Cough: Secondary | ICD-10-CM | POA: Diagnosis not present

## 2017-10-24 DIAGNOSIS — N183 Chronic kidney disease, stage 3 unspecified: Secondary | ICD-10-CM

## 2017-10-24 DIAGNOSIS — R928 Other abnormal and inconclusive findings on diagnostic imaging of breast: Secondary | ICD-10-CM

## 2017-10-24 DIAGNOSIS — E7800 Pure hypercholesterolemia, unspecified: Secondary | ICD-10-CM | POA: Diagnosis not present

## 2017-10-24 DIAGNOSIS — E559 Vitamin D deficiency, unspecified: Secondary | ICD-10-CM

## 2017-10-24 DIAGNOSIS — I739 Peripheral vascular disease, unspecified: Secondary | ICD-10-CM | POA: Diagnosis not present

## 2017-10-24 DIAGNOSIS — J449 Chronic obstructive pulmonary disease, unspecified: Secondary | ICD-10-CM | POA: Diagnosis not present

## 2017-10-24 DIAGNOSIS — E538 Deficiency of other specified B group vitamins: Secondary | ICD-10-CM

## 2017-10-24 DIAGNOSIS — I10 Essential (primary) hypertension: Secondary | ICD-10-CM | POA: Diagnosis not present

## 2017-10-24 DIAGNOSIS — Z23 Encounter for immunization: Secondary | ICD-10-CM

## 2017-10-24 DIAGNOSIS — E1151 Type 2 diabetes mellitus with diabetic peripheral angiopathy without gangrene: Secondary | ICD-10-CM | POA: Diagnosis not present

## 2017-10-24 DIAGNOSIS — K219 Gastro-esophageal reflux disease without esophagitis: Secondary | ICD-10-CM

## 2017-10-24 DIAGNOSIS — R059 Cough, unspecified: Secondary | ICD-10-CM

## 2017-10-24 NOTE — Progress Notes (Signed)
[name], female   DOB: 1927-04-04, 84 y.o.   MRN: [id number]   Subjective:    [name], female    DOB: July 03, 1926, 84 y.o.   MRN: [id number]  HPI  Patient here for a scheduled follow up.  She is accompanied by her daughter.  History obtained from both of them.  She is S/p left nondisplaced proximal humerus fracture on 06/29/17.  S/p therapy.  Had f/u in 08/2017.  Recommend time out of sling and continuing active rom of the LUE.  Recommended f/u in 6 weeks. Getting around better.  Using her walker.  Does some exercises with her caretaker.  Using her inhalers regularly.  Breathing overall stable. Still some intermittent cough and congestion, but again feels things are stable.  No acid reflux.  No abdominal pain.  Bowels stable.  Discussed recent labs.  a1c 7.4.  Discussed diet and exercise.        Past Medical History:  Diagnosis Date  . Asthma   . Chronic bronchitis ([facility])   . COPD (chronic obstructive pulmonary disease) (HCC)    uses inhalers  . Depression   . Diabetes mellitus (HCC)   . GERD (gastroesophageal reflux disease)    hiatal hernia  . Gout   . Hematuria    followed by urology  . History of colon polyps   . History of frequent urinary tract infections   . Humeral fracture 9/09   comminuted impacted proximal  . Hypercholesterolemia   . Hypertension   . Hypothyroidism   . Peripheral vascular disease (HCC)   . Sleep apnea    does not use cpap as it did not benefit her   Past Surgical History:  Procedure Laterality Date  . BLADDER SUSPENSION  12/08/99  . BREAST BIOPSY Right 1960's  . CARPAL TUNNEL RELEASE Right 03/17/2017   Procedure: CARPAL TUNNEL RELEASE;  Surgeon: Kennedy Bucker, MD;  Location: [facility] ORS;  Service: Orthopedics;  Laterality: Right;  . CHOLECYSTECTOMY  1994  . COLONOSCOPY W/ POLYPECTOMY    . EYE SURGERY Bilateral    cataract extractions  . KNEE SURGERY Right 2013   arthroscopy  . ORIF WRIST FRACTURE Right 03/17/2017   Procedure: OPEN REDUCTION INTERNAL FIXATION (ORIF) WRIST FRACTURE;  Surgeon: Kennedy Bucker, MD;  Location: [facility] ORS;  Service: Orthopedics;  Laterality: Right;   Family History  Problem Relation Age of Onset  . Hypertension Father   . CVA Father   . CAD Father   . Hypertension Mother   . Colitis Mother   . CAD Mother   . Breast cancer Neg Hx   . Colon cancer Neg Hx    Social History   Socioeconomic History  . Marital status: Widowed    Spouse name: Not on file  . Number of children: 2  . Years of education: Not on file  . Highest education level: Not on file  Occupational History  . Not on file  Social Needs  . Financial resource strain: Not on file  . Food insecurity:    Worry: Not on file    Inability: Not on file  . Transportation needs:    Medical: Not on file    Non-medical: Not on file  Tobacco Use  . Smoking status: Never Smoker  . Smokeless tobacco: Never Used  Substance and Sexual Activity  . Alcohol use: No    Alcohol/week: 0.0 oz  . Drug use: No  . Sexual activity: Never  Lifestyle  .  Physical activity:    Days per week: Not on file    Minutes per session: Not on file  . Stress: Not on file  Relationships  . Social connections:    Talks on phone: Not on file    Gets together: Not on file    Attends religious service: Not on file    Active member of club or organization: Not on file    Attends meetings of clubs or organizations: Not on file    Relationship status: Not on file  Other Topics Concern  . Not on file  Social History Narrative  . Not on file    Outpatient Encounter Medications as of 10/24/2017  Medication Sig  . allopurinol (ZYLOPRIM) 100 MG tablet Take 1 tablet (100 mg total) by mouth daily.  . Calcium Carbonate-Vitamin D (CALCIUM 600+D) 600-400 MG-UNIT per tablet Take 1 tablet by mouth daily.   . cetirizine (ZYRTEC) 10 MG tablet Take 10 mg by mouth daily.  . Cholecalciferol (VITAMIN D-3) 1000 UNITS CAPS Take 1 capsule by mouth daily.   . Cranberry-Cholecalciferol 4200-500 MG-UNIT CAPS Take 1 capsule by mouth daily.   Marland Kitchen. FLUoxetine (PROZAC) 20 MG capsule Take 1 capsule (20 mg total) by mouth daily.  . Fluticasone-Salmeterol (ADVAIR DISKUS) 250-50 MCG/DOSE AEPB Inhale 1 puff into the lungs 2 (two) times daily.  Marland Kitchen. levothyroxine (SYNTHROID, LEVOTHROID) 50 MCG tablet Take 1 tablet (50 mcg total) by mouth daily before breakfast.  . methenamine (HIPREX) 1 g tablet Take 1 tablet (1 g total) by mouth daily.  . montelukast (SINGULAIR) 10 MG tablet Take 1 tablet (10 mg total) by mouth at bedtime.  Marland Kitchen. nystatin (NYSTATIN) powder Apply topically 2 (two) times daily.  Marland Kitchen. nystatin cream (MYCOSTATIN) Apply 1 application 2 (two) times daily topically.  . pantoprazole (PROTONIX) 40 MG tablet Take 1 tablet (40 mg total) by mouth daily.  . simvastatin (ZOCOR) 20 MG tablet Take 1 tablet (20 mg total) by mouth daily.  Marland Kitchen. tolterodine (DETROL) 2 MG tablet Take 1 tablet (2 mg total) by mouth 2 (two) times daily.  . VENTOLIN HFA 108 (90 Base) MCG/ACT inhaler Inhale 2 puffs into the lungs every 6 (six) hours as needed for wheezi ng or shortness of breath.  . [DISCONTINUED] clotrimazole (MYCELEX) 10 MG troche Take 1 tablet (10 mg total) by mouth 3 (three) times daily. Dissolve in mouth tid  . [DISCONTINUED] nystatin (MYCOSTATIN) 100000 UNIT/ML suspension Take 5 mLs (500,000 Units total) by mouth 3 (three) times daily. Rinse and spit tid  . [DISCONTINUED] oxyCODONE-acetaminophen (PERCOCET/ROXICET) 5-325 MG tablet Take 1 tablet by mouth every 6 (six) hours as needed for severe pain. (Patient not taking: Reported on 08/09/2017)   No facility-administered encounter medications on file as of 10/24/2017.     Review of Systems  Constitutional: Negative for appetite change and unexpected weight change.  HENT: Negative for congestion and sinus pressure.   Respiratory: Positive for cough. Negative for chest tightness.        Breathing stable.   Cardiovascular:  Negative for chest pain, palpitations and leg swelling.  Gastrointestinal: Negative for abdominal pain, diarrhea, nausea and vomiting.  Genitourinary: Negative for difficulty urinating and dysuria.  Musculoskeletal: Negative for joint swelling and myalgias.       Still some discomfort in her arm, but overall improved.   Skin: Negative for color change and rash.  Neurological: Negative for dizziness, light-headedness and headaches.  Psychiatric/Behavioral: Negative for agitation and dysphoric mood.       Objective:  Physical Exam  Constitutional: She appears well-developed and well-nourished. No distress.  HENT:  Nose: Nose normal.  Mouth/Throat: Oropharynx is clear and moist.  Neck: Neck supple. No thyromegaly present.  Cardiovascular: Normal rate and regular rhythm.  Pulmonary/Chest: Breath sounds normal. No respiratory distress.  Some cough with forced expiration.  Increased air movement.  No increased wheezing.    Abdominal: Soft. Bowel sounds are normal. There is no tenderness.  Musculoskeletal: She exhibits no edema or tenderness.  Lymphadenopathy:    She has no cervical adenopathy.  Skin: No rash noted. No erythema.  Psychiatric: She has a normal mood and affect. Her behavior is normal.    BP 126/70 (BP Location: Right Arm, Patient Position: Sitting, Cuff Size: Large)   Pulse 94   Temp 97.6 F (36.4 C) (Oral)   Resp 18   Wt 183 lb (83 kg)   SpO2 95%   BMI 29.54 kg/m  Wt Readings from Last 3 Encounters:  10/24/17 183 lb (83 kg)  08/09/17 188 lb (85.3 kg)  06/29/17 190 lb (86.2 kg)     Lab Results  Component Value Date   WBC 7.1 06/29/2017   HGB 12.0 06/29/2017   HCT 36.8 06/29/2017   PLT 239 06/29/2017   GLUCOSE 156 (H) 10/21/2017   CHOL 140 10/21/2017   TRIG 146.0 10/21/2017   HDL 41.70 10/21/2017   LDLCALC 69 10/21/2017   ALT 11 10/21/2017   AST 17 10/21/2017   NA 141 10/21/2017   K 4.7 10/21/2017   CL 104 10/21/2017   CREATININE 1.17 10/21/2017     BUN 23 10/21/2017   CO2 28 10/21/2017   TSH 2.69 12/31/2016   HGBA1C 7.4 (H) 10/21/2017   MICROALBUR 10.1 (H) 12/31/2016    Dg Elbow Complete Left  Result Date: 06/29/2017 CLINICAL DATA:  [place] in bathroom onto LEFT shoulder and elbow, lost her balance, pain EXAM: LEFT ELBOW - COMPLETE 3+ VIEW COMPARISON:  12/16/2009 FINDINGS: Osseous demineralization. Joint spaces preserved. No acute fracture, dislocation, or bone destruction. Proximal radial fracture seen on the previous exam no longer identified. No elbow joint effusion. IMPRESSION: No acute osseous abnormalities. Electronically Signed   By: Ulyses Southward M.D.   On: 06/29/2017 20:25   Dg Shoulder Left  Result Date: 06/29/2017 CLINICAL DATA:  [place], pain EXAM: LEFT SHOULDER - 2+ VIEW FINDINGS: Comminuted LEFT humeral head fracture. No dislocation. Slight avulsion of the greater tuberosity. Clavicle and acromion appear intact. Osteopenia. IMPRESSION: Comminuted LEFT humeral head fracture without dislocation. Electronically Signed   By: Elsie Stain M.D.   On: 06/29/2017 20:24       Assessment & Plan:   Problem List Items Addressed This Visit    Abnormal mammogram    Declines further mammogram.       Chronic obstructive airway disease with asthma (HCC)    Breathing currently stable.  Continue current regimen.  Follow.        CKD (chronic kidney disease) stage 3, GFR 30-59 ml/min (HCC)    Avoid antiinflammatories.  Creatinine has been stable.  Follow.   Lab Results  Component Value Date   CREATININE 1.17 10/21/2017        Relevant Orders   Basic metabolic panel   Cough    Persistent intermittent cough, but overall she feels breathing is stable.  Was seeing pulmonary.  Continue current regimen.  Follow.       Diabetes mellitus with peripheral vascular disease (HCC)    Low carb diet and exercise.  Discussed recent a1c - 7.4.  Increased.        Relevant Orders   Hemoglobin A1c   Microalbumin / creatinine urine ratio   GERD  (gastroesophageal reflux disease)    Controlled on current regimen.  Follow.       Hypercholesterolemia    On simvastatin.  Low cholesterol diet and exercise.  Follow lipid panel and liver function tests.        Relevant Orders   Hepatic function panel   Lipid panel   Hypertension    Blood pressure under good control.  Continue same medication regimen.  Follow pressures.  Follow metabolic panel.        Relevant Orders   TSH   Peripheral vascular disease (HCC)    Varying pressures in each arm.  Stable.  Follow.       Vitamin B12 deficiency    Continue B12 injections.        Vitamin D deficiency    Follow vitamin D level.       Relevant Orders   VITAMIN D 25 Hydroxy (Vit-D Deficiency, Fractures)    Other Visit Diagnoses    Need for vaccination against Streptococcus pneumoniae using pneumococcal conjugate vaccine 13    -  Primary   Relevant Orders   Pneumococcal conjugate vaccine 13-valent (Completed)       Dale Gilbert, MD

## 2017-10-26 ENCOUNTER — Encounter: Payer: Self-pay | Admitting: Internal Medicine

## 2017-10-26 DIAGNOSIS — S42292D Other displaced fracture of upper end of left humerus, subsequent encounter for fracture with routine healing: Secondary | ICD-10-CM | POA: Diagnosis not present

## 2017-10-26 NOTE — Assessment & Plan Note (Signed)
Controlled on current regimen.  Follow.  

## 2017-10-26 NOTE — Assessment & Plan Note (Signed)
Varying pressures in each arm.  Stable.  Follow.

## 2017-10-26 NOTE — Assessment & Plan Note (Signed)
Declines further mammogram.

## 2017-10-26 NOTE — Assessment & Plan Note (Signed)
Avoid antiinflammatories.  Creatinine has been stable.  Follow.   Lab Results  Component Value Date   CREATININE 1.17 10/21/2017

## 2017-10-26 NOTE — Assessment & Plan Note (Signed)
Persistent intermittent cough, but overall she feels breathing is stable.  Was seeing pulmonary.  Continue current regimen.  Follow.

## 2017-10-26 NOTE — Assessment & Plan Note (Signed)
Breathing currently stable.  Continue current regimen.  Follow.

## 2017-10-26 NOTE — Assessment & Plan Note (Signed)
Blood pressure under good control.  Continue same medication regimen.  Follow pressures.  Follow metabolic panel.   

## 2017-10-26 NOTE — Assessment & Plan Note (Signed)
On simvastatin.  Low cholesterol diet and exercise.  Follow lipid panel and liver function tests.   

## 2017-10-26 NOTE — Assessment & Plan Note (Signed)
Continue B12 injections.   

## 2017-10-26 NOTE — Assessment & Plan Note (Signed)
Follow vitamin D level.  

## 2017-10-26 NOTE — Assessment & Plan Note (Signed)
Low carb diet and exercise.  Discussed recent a1c - 7.4.  Increased.

## 2017-10-27 ENCOUNTER — Ambulatory Visit: Payer: Medicare Other

## 2017-11-13 ENCOUNTER — Other Ambulatory Visit: Payer: Self-pay | Admitting: Internal Medicine

## 2017-11-15 DIAGNOSIS — S42292D Other displaced fracture of upper end of left humerus, subsequent encounter for fracture with routine healing: Secondary | ICD-10-CM | POA: Diagnosis not present

## 2017-11-15 DIAGNOSIS — I739 Peripheral vascular disease, unspecified: Secondary | ICD-10-CM | POA: Diagnosis not present

## 2017-11-15 DIAGNOSIS — Z9181 History of falling: Secondary | ICD-10-CM | POA: Diagnosis not present

## 2017-11-15 DIAGNOSIS — Z87440 Personal history of urinary (tract) infections: Secondary | ICD-10-CM | POA: Diagnosis not present

## 2017-11-15 DIAGNOSIS — I10 Essential (primary) hypertension: Secondary | ICD-10-CM | POA: Diagnosis not present

## 2017-11-15 DIAGNOSIS — Z86010 Personal history of colonic polyps: Secondary | ICD-10-CM | POA: Diagnosis not present

## 2017-11-16 ENCOUNTER — Ambulatory Visit (INDEPENDENT_AMBULATORY_CARE_PROVIDER_SITE_OTHER): Payer: Medicare Other | Admitting: *Deleted

## 2017-11-16 DIAGNOSIS — E538 Deficiency of other specified B group vitamins: Secondary | ICD-10-CM

## 2017-11-16 MED ORDER — CYANOCOBALAMIN 1000 MCG/ML IJ SOLN
1000.0000 ug | Freq: Once | INTRAMUSCULAR | Status: AC
  Administered 2017-11-16: 1000 ug via INTRAMUSCULAR

## 2017-11-16 NOTE — Progress Notes (Signed)
Patient presented for B 12 injection to left deltoid, patient voiced no concerns nor showed any signs of distress during injection. 

## 2017-11-17 DIAGNOSIS — Z86010 Personal history of colonic polyps: Secondary | ICD-10-CM | POA: Diagnosis not present

## 2017-11-17 DIAGNOSIS — Z9181 History of falling: Secondary | ICD-10-CM | POA: Diagnosis not present

## 2017-11-17 DIAGNOSIS — I739 Peripheral vascular disease, unspecified: Secondary | ICD-10-CM | POA: Diagnosis not present

## 2017-11-17 DIAGNOSIS — S42292D Other displaced fracture of upper end of left humerus, subsequent encounter for fracture with routine healing: Secondary | ICD-10-CM | POA: Diagnosis not present

## 2017-11-17 DIAGNOSIS — I10 Essential (primary) hypertension: Secondary | ICD-10-CM | POA: Diagnosis not present

## 2017-11-17 DIAGNOSIS — Z87440 Personal history of urinary (tract) infections: Secondary | ICD-10-CM | POA: Diagnosis not present

## 2017-11-22 DIAGNOSIS — S42292D Other displaced fracture of upper end of left humerus, subsequent encounter for fracture with routine healing: Secondary | ICD-10-CM | POA: Diagnosis not present

## 2017-11-22 DIAGNOSIS — Z86010 Personal history of colonic polyps: Secondary | ICD-10-CM | POA: Diagnosis not present

## 2017-11-22 DIAGNOSIS — I10 Essential (primary) hypertension: Secondary | ICD-10-CM | POA: Diagnosis not present

## 2017-11-22 DIAGNOSIS — Z9181 History of falling: Secondary | ICD-10-CM | POA: Diagnosis not present

## 2017-11-22 DIAGNOSIS — Z87440 Personal history of urinary (tract) infections: Secondary | ICD-10-CM | POA: Diagnosis not present

## 2017-11-22 DIAGNOSIS — I739 Peripheral vascular disease, unspecified: Secondary | ICD-10-CM | POA: Diagnosis not present

## 2017-11-25 DIAGNOSIS — I739 Peripheral vascular disease, unspecified: Secondary | ICD-10-CM | POA: Diagnosis not present

## 2017-11-25 DIAGNOSIS — I10 Essential (primary) hypertension: Secondary | ICD-10-CM | POA: Diagnosis not present

## 2017-11-25 DIAGNOSIS — Z9181 History of falling: Secondary | ICD-10-CM | POA: Diagnosis not present

## 2017-11-25 DIAGNOSIS — S42292D Other displaced fracture of upper end of left humerus, subsequent encounter for fracture with routine healing: Secondary | ICD-10-CM | POA: Diagnosis not present

## 2017-11-25 DIAGNOSIS — Z87440 Personal history of urinary (tract) infections: Secondary | ICD-10-CM | POA: Diagnosis not present

## 2017-11-25 DIAGNOSIS — Z86010 Personal history of colonic polyps: Secondary | ICD-10-CM | POA: Diagnosis not present

## 2017-11-29 ENCOUNTER — Other Ambulatory Visit: Payer: Self-pay

## 2017-11-29 DIAGNOSIS — I10 Essential (primary) hypertension: Secondary | ICD-10-CM | POA: Diagnosis not present

## 2017-11-29 DIAGNOSIS — Z9181 History of falling: Secondary | ICD-10-CM | POA: Diagnosis not present

## 2017-11-29 DIAGNOSIS — Z87440 Personal history of urinary (tract) infections: Secondary | ICD-10-CM | POA: Diagnosis not present

## 2017-11-29 DIAGNOSIS — S42292D Other displaced fracture of upper end of left humerus, subsequent encounter for fracture with routine healing: Secondary | ICD-10-CM | POA: Diagnosis not present

## 2017-11-29 DIAGNOSIS — I739 Peripheral vascular disease, unspecified: Secondary | ICD-10-CM | POA: Diagnosis not present

## 2017-11-29 DIAGNOSIS — Z86010 Personal history of colonic polyps: Secondary | ICD-10-CM | POA: Diagnosis not present

## 2017-11-29 NOTE — Telephone Encounter (Signed)
Last filled 10/31/17  NOV 02/27/18 lov 10/24/17

## 2017-11-30 MED ORDER — METHENAMINE HIPPURATE 1 G PO TABS: ORAL_TABLET | ORAL | 2 refills | Status: DC

## 2017-12-01 ENCOUNTER — Other Ambulatory Visit: Payer: Self-pay | Admitting: Internal Medicine

## 2017-12-01 DIAGNOSIS — S42292D Other displaced fracture of upper end of left humerus, subsequent encounter for fracture with routine healing: Secondary | ICD-10-CM | POA: Diagnosis not present

## 2017-12-01 DIAGNOSIS — I10 Essential (primary) hypertension: Secondary | ICD-10-CM | POA: Diagnosis not present

## 2017-12-01 DIAGNOSIS — Z87440 Personal history of urinary (tract) infections: Secondary | ICD-10-CM | POA: Diagnosis not present

## 2017-12-01 DIAGNOSIS — Z9181 History of falling: Secondary | ICD-10-CM | POA: Diagnosis not present

## 2017-12-01 DIAGNOSIS — Z86010 Personal history of colonic polyps: Secondary | ICD-10-CM | POA: Diagnosis not present

## 2017-12-01 DIAGNOSIS — I739 Peripheral vascular disease, unspecified: Secondary | ICD-10-CM | POA: Diagnosis not present

## 2017-12-05 DIAGNOSIS — Z9181 History of falling: Secondary | ICD-10-CM | POA: Diagnosis not present

## 2017-12-05 DIAGNOSIS — I739 Peripheral vascular disease, unspecified: Secondary | ICD-10-CM | POA: Diagnosis not present

## 2017-12-05 DIAGNOSIS — S42292D Other displaced fracture of upper end of left humerus, subsequent encounter for fracture with routine healing: Secondary | ICD-10-CM | POA: Diagnosis not present

## 2017-12-05 DIAGNOSIS — Z86010 Personal history of colonic polyps: Secondary | ICD-10-CM | POA: Diagnosis not present

## 2017-12-05 DIAGNOSIS — Z87440 Personal history of urinary (tract) infections: Secondary | ICD-10-CM | POA: Diagnosis not present

## 2017-12-05 DIAGNOSIS — I10 Essential (primary) hypertension: Secondary | ICD-10-CM | POA: Diagnosis not present

## 2017-12-06 ENCOUNTER — Telehealth: Payer: Self-pay

## 2017-12-06 MED ORDER — FLUOXETINE HCL 20 MG PO CAPS: 20.0000 mg | ORAL_CAPSULE | Freq: Every day | ORAL | 3 refills | Status: DC

## 2017-12-06 MED ORDER — TOLTERODINE TARTRATE 2 MG PO TABS: 2.0000 mg | ORAL_TABLET | Freq: Two times a day (BID) | ORAL | 3 refills | Status: DC

## 2017-12-06 MED ORDER — PANTOPRAZOLE SODIUM 40 MG PO TBEC: 40.0000 mg | DELAYED_RELEASE_TABLET | Freq: Every day | ORAL | 3 refills | Status: DC

## 2017-12-07 DIAGNOSIS — I10 Essential (primary) hypertension: Secondary | ICD-10-CM | POA: Diagnosis not present

## 2017-12-07 DIAGNOSIS — Z86010 Personal history of colonic polyps: Secondary | ICD-10-CM | POA: Diagnosis not present

## 2017-12-07 DIAGNOSIS — Z9181 History of falling: Secondary | ICD-10-CM | POA: Diagnosis not present

## 2017-12-07 DIAGNOSIS — I739 Peripheral vascular disease, unspecified: Secondary | ICD-10-CM | POA: Diagnosis not present

## 2017-12-07 DIAGNOSIS — S42292D Other displaced fracture of upper end of left humerus, subsequent encounter for fracture with routine healing: Secondary | ICD-10-CM | POA: Diagnosis not present

## 2017-12-07 DIAGNOSIS — Z87440 Personal history of urinary (tract) infections: Secondary | ICD-10-CM | POA: Diagnosis not present

## 2017-12-07 NOTE — Telephone Encounter (Signed)
Error

## 2017-12-13 DIAGNOSIS — I10 Essential (primary) hypertension: Secondary | ICD-10-CM | POA: Diagnosis not present

## 2017-12-13 DIAGNOSIS — Z86010 Personal history of colonic polyps: Secondary | ICD-10-CM | POA: Diagnosis not present

## 2017-12-13 DIAGNOSIS — S42292D Other displaced fracture of upper end of left humerus, subsequent encounter for fracture with routine healing: Secondary | ICD-10-CM | POA: Diagnosis not present

## 2017-12-13 DIAGNOSIS — Z9181 History of falling: Secondary | ICD-10-CM | POA: Diagnosis not present

## 2017-12-13 DIAGNOSIS — Z87440 Personal history of urinary (tract) infections: Secondary | ICD-10-CM | POA: Diagnosis not present

## 2017-12-13 DIAGNOSIS — I739 Peripheral vascular disease, unspecified: Secondary | ICD-10-CM | POA: Diagnosis not present

## 2017-12-15 DIAGNOSIS — Z9181 History of falling: Secondary | ICD-10-CM | POA: Diagnosis not present

## 2017-12-15 DIAGNOSIS — Z87440 Personal history of urinary (tract) infections: Secondary | ICD-10-CM | POA: Diagnosis not present

## 2017-12-15 DIAGNOSIS — Z86010 Personal history of colonic polyps: Secondary | ICD-10-CM | POA: Diagnosis not present

## 2017-12-15 DIAGNOSIS — I10 Essential (primary) hypertension: Secondary | ICD-10-CM | POA: Diagnosis not present

## 2017-12-15 DIAGNOSIS — I739 Peripheral vascular disease, unspecified: Secondary | ICD-10-CM | POA: Diagnosis not present

## 2017-12-15 DIAGNOSIS — S42292D Other displaced fracture of upper end of left humerus, subsequent encounter for fracture with routine healing: Secondary | ICD-10-CM | POA: Diagnosis not present

## 2017-12-19 DIAGNOSIS — Z86010 Personal history of colonic polyps: Secondary | ICD-10-CM | POA: Diagnosis not present

## 2017-12-19 DIAGNOSIS — I739 Peripheral vascular disease, unspecified: Secondary | ICD-10-CM | POA: Diagnosis not present

## 2017-12-19 DIAGNOSIS — S42292D Other displaced fracture of upper end of left humerus, subsequent encounter for fracture with routine healing: Secondary | ICD-10-CM | POA: Diagnosis not present

## 2017-12-19 DIAGNOSIS — I10 Essential (primary) hypertension: Secondary | ICD-10-CM | POA: Diagnosis not present

## 2017-12-19 DIAGNOSIS — Z9181 History of falling: Secondary | ICD-10-CM | POA: Diagnosis not present

## 2017-12-19 DIAGNOSIS — Z87440 Personal history of urinary (tract) infections: Secondary | ICD-10-CM | POA: Diagnosis not present

## 2017-12-20 ENCOUNTER — Ambulatory Visit (INDEPENDENT_AMBULATORY_CARE_PROVIDER_SITE_OTHER): Payer: Medicare Other

## 2017-12-20 DIAGNOSIS — Z86010 Personal history of colonic polyps: Secondary | ICD-10-CM | POA: Diagnosis not present

## 2017-12-20 DIAGNOSIS — Z87440 Personal history of urinary (tract) infections: Secondary | ICD-10-CM | POA: Diagnosis not present

## 2017-12-20 DIAGNOSIS — I739 Peripheral vascular disease, unspecified: Secondary | ICD-10-CM | POA: Diagnosis not present

## 2017-12-20 DIAGNOSIS — E538 Deficiency of other specified B group vitamins: Secondary | ICD-10-CM | POA: Diagnosis not present

## 2017-12-20 DIAGNOSIS — Z9181 History of falling: Secondary | ICD-10-CM | POA: Diagnosis not present

## 2017-12-20 DIAGNOSIS — S42292D Other displaced fracture of upper end of left humerus, subsequent encounter for fracture with routine healing: Secondary | ICD-10-CM | POA: Diagnosis not present

## 2017-12-20 DIAGNOSIS — I10 Essential (primary) hypertension: Secondary | ICD-10-CM | POA: Diagnosis not present

## 2017-12-20 MED ORDER — CYANOCOBALAMIN 1000 MCG/ML IJ SOLN
1000.0000 ug | Freq: Once | INTRAMUSCULAR | Status: AC
  Administered 2017-12-20: 1000 ug via INTRAMUSCULAR

## 2017-12-20 NOTE — Progress Notes (Addendum)
B12 injection given in right deltoid. Patient tolerated well.  Reviewed.  Dr Lorin PicketScott

## 2017-12-21 DIAGNOSIS — S42292D Other displaced fracture of upper end of left humerus, subsequent encounter for fracture with routine healing: Secondary | ICD-10-CM | POA: Diagnosis not present

## 2017-12-30 DIAGNOSIS — S42292D Other displaced fracture of upper end of left humerus, subsequent encounter for fracture with routine healing: Secondary | ICD-10-CM | POA: Diagnosis not present

## 2018-01-11 ENCOUNTER — Other Ambulatory Visit: Payer: Self-pay | Admitting: Internal Medicine

## 2018-01-14 ENCOUNTER — Other Ambulatory Visit: Payer: Self-pay | Admitting: Internal Medicine

## 2018-01-16 MED ORDER — LEVOTHYROXINE SODIUM 50 MCG PO TABS: 50.0000 ug | ORAL_TABLET | Freq: Every day | ORAL | 5 refills | Status: DC

## 2018-01-24 ENCOUNTER — Ambulatory Visit: Payer: Medicare Other

## 2018-01-26 ENCOUNTER — Ambulatory Visit (INDEPENDENT_AMBULATORY_CARE_PROVIDER_SITE_OTHER): Payer: Medicare Other | Admitting: Internal Medicine

## 2018-01-26 ENCOUNTER — Encounter: Payer: Self-pay | Admitting: Internal Medicine

## 2018-01-26 ENCOUNTER — Ambulatory Visit: Payer: Medicare Other

## 2018-01-26 ENCOUNTER — Ambulatory Visit: Payer: Medicare Other | Admitting: Internal Medicine

## 2018-01-26 VITALS — BP 102/64 | HR 63 | Temp 97.7°F | Resp 18

## 2018-01-26 VITALS — BP 102/64 | HR 63 | Temp 97.7°F | Resp 18 | Wt 188.4 lb

## 2018-01-26 DIAGNOSIS — R0602 Shortness of breath: Secondary | ICD-10-CM

## 2018-01-26 DIAGNOSIS — J449 Chronic obstructive pulmonary disease, unspecified: Secondary | ICD-10-CM

## 2018-01-26 DIAGNOSIS — E538 Deficiency of other specified B group vitamins: Secondary | ICD-10-CM | POA: Diagnosis not present

## 2018-01-26 MED ORDER — CYANOCOBALAMIN 1000 MCG/ML IJ SOLN
1000.0000 ug | Freq: Once | INTRAMUSCULAR | Status: AC
  Administered 2018-01-26: 1000 ug via INTRAMUSCULAR

## 2018-01-26 NOTE — Progress Notes (Signed)
[name], female   DOB: 1927/01/03, 84 y.o.   MRN: [id number] See other note.  Pt came in for B12 injection.  Was noted to be sob.  Was asked to stay for work in appt for evaluation.  She had to wait for me to work her in.  I was then informed she could not stay.  I discussed this with her and her caretaker and explained the need for evaluation.  They declines to stay stating the had to leave.  I explained I would evaluate her now.  They explained they could not stay and requested work in appt tomorrow.  She left against my medical recommendation and was given an appt for work in tomorrow.    Dr Lorin Picket

## 2018-01-26 NOTE — Progress Notes (Signed)
Patient presented for B 12 injection to left deltoid, patient voiced no concerns nor showed any signs of distress during injection. 

## 2018-01-27 ENCOUNTER — Ambulatory Visit (INDEPENDENT_AMBULATORY_CARE_PROVIDER_SITE_OTHER): Payer: Medicare Other | Admitting: Internal Medicine

## 2018-01-27 ENCOUNTER — Ambulatory Visit (INDEPENDENT_AMBULATORY_CARE_PROVIDER_SITE_OTHER): Payer: Medicare Other

## 2018-01-27 VITALS — BP 136/80 | HR 84 | Temp 98.0°F | Resp 18 | Wt 187.2 lb

## 2018-01-27 DIAGNOSIS — R05 Cough: Secondary | ICD-10-CM

## 2018-01-27 DIAGNOSIS — R053 Chronic cough: Secondary | ICD-10-CM

## 2018-01-27 DIAGNOSIS — J449 Chronic obstructive pulmonary disease, unspecified: Secondary | ICD-10-CM

## 2018-01-27 DIAGNOSIS — R059 Cough, unspecified: Secondary | ICD-10-CM

## 2018-01-27 MED ORDER — FLUTICASONE-SALMETEROL 500-50 MCG/DOSE IN AEPB: 1.0000 | INHALATION_SPRAY | Freq: Two times a day (BID) | RESPIRATORY_TRACT | 1 refills | Status: DC

## 2018-01-27 NOTE — Progress Notes (Signed)
[name], female   DOB: October 26, 1926, 84 y.o.   MRN: [id number]   Subjective:    [name], female    DOB: 1927/03/02, 84 y.o.   MRN: [id number]  HPI  Patient here as a work in for congestion, cough and sob.  She was here yesterday for B12 injection.  Nurse was concerned regarding her sob and congestion.  She could not stay to be evaluated yesterday, so she returned today accompanied by her son.  She feels her breathing is stable.  With some cough and congestion, but feels is stable.  She is using advair 250.  Takes mucinex in the am.  Eating. No fever.  No nausea or vomiting.  No acid reflux.     Past Medical History:  Diagnosis Date  . Asthma   . Chronic bronchitis (HCC)   . COPD (chronic obstructive pulmonary disease) (HCC)    uses inhalers  . Depression   . Diabetes mellitus (HCC)   . GERD (gastroesophageal reflux disease)    hiatal hernia  . Gout   . Hematuria    followed by urology  . History of colon polyps   . History of frequent urinary tract infections   . Humeral fracture 9/09   comminuted impacted proximal  . Hypercholesterolemia   . Hypertension   . Hypothyroidism   . Peripheral vascular disease (HCC)   . Sleep apnea    does not use cpap as it did not benefit her   Past Surgical History:  Procedure Laterality Date  . BLADDER SUSPENSION  12/08/99  . BREAST BIOPSY Right 1960's  . CARPAL TUNNEL RELEASE Right 03/17/2017   Procedure: CARPAL TUNNEL RELEASE;  Surgeon: Kennedy Bucker, MD;  Location: [facility] ORS;  Service: Orthopedics;  Laterality: Right;  . CHOLECYSTECTOMY  1994  . COLONOSCOPY W/ POLYPECTOMY    . EYE SURGERY Bilateral    cataract extractions  . KNEE SURGERY Right 2013   arthroscopy  . ORIF WRIST FRACTURE Right 03/17/2017   Procedure: OPEN REDUCTION INTERNAL FIXATION (ORIF) WRIST FRACTURE;  Surgeon: Kennedy Bucker, MD;  Location: [facility] ORS;  Service: Orthopedics;  Laterality: Right;   Family History  Problem Relation Age of  Onset  . Hypertension Father   . CVA Father   . CAD Father   . Hypertension Mother   . Colitis Mother   . CAD Mother   . Breast cancer Neg Hx   . Colon cancer Neg Hx    Social History   Socioeconomic History  . Marital status: Widowed    Spouse name: Not on file  . Number of children: 2  . Years of education: Not on file  . Highest education level: Not on file  Occupational History  . Not on file  Social Needs  . Financial resource strain: Not on file  . Food insecurity:    Worry: Not on file    Inability: Not on file  . Transportation needs:    Medical: Not on file    Non-medical: Not on file  Tobacco Use  . Smoking status: Never Smoker  . Smokeless tobacco: Never Used  Substance and Sexual Activity  . Alcohol use: No    Alcohol/week: 0.0 standard drinks  . Drug use: No  . Sexual activity: Never  Lifestyle  . Physical activity:    Days per week: Not on file    Minutes per session: Not on file  . Stress: Not on file  Relationships  . Social  connections:    Talks on phone: Not on file    Gets together: Not on file    Attends religious service: Not on file    Active member of club or organization: Not on file    Attends meetings of clubs or organizations: Not on file    Relationship status: Not on file  Other Topics Concern  . Not on file  Social History Narrative  . Not on file    Outpatient Encounter Medications as of 01/27/2018  Medication Sig  . allopurinol (ZYLOPRIM) 100 MG tablet Take 1 tablet (100 mg total) by mouth daily.  . Calcium Carbonate-Vitamin D (CALCIUM 600+D) 600-400 MG-UNIT per tablet Take 1 tablet by mouth daily.   . cetirizine (ZYRTEC) 10 MG tablet Take 10 mg by mouth daily.  . Cholecalciferol (VITAMIN D-3) 1000 UNITS CAPS Take 1 capsule by mouth daily.  . Cranberry-Cholecalciferol 4200-500 MG-UNIT CAPS Take 1 capsule by mouth daily.   Marland Kitchen FLUoxetine (PROZAC) 20 MG capsule Take 1 capsule (20 mg total) by mouth daily.  .  Fluticasone-Salmeterol (ADVAIR DISKUS) 500-50 MCG/DOSE AEPB Inhale 1 puff into the lungs 2 (two) times daily.  Marland Kitchen levothyroxine (SYNTHROID, LEVOTHROID) 50 MCG tablet Take 1 tablet (50 mcg total) by mouth daily before breakfast.  . methenamine (HIPREX) 1 g tablet Take 1 tablet (1 g total) by mouth daily.  . montelukast (SINGULAIR) 10 MG tablet Take 1 tablet (10 mg total) by mouth at bedtime.  Marland Kitchen nystatin (MYCOSTATIN/NYSTOP) powder Apply topically 2 (two) times daily.  Marland Kitchen nystatin cream (MYCOSTATIN) Apply 1 application 2 (two) times daily topically.  . pantoprazole (PROTONIX) 40 MG tablet Take 1 tablet (40 mg total) by mouth daily.  . simvastatin (ZOCOR) 20 MG tablet Take 1 tablet (20 mg total) by mouth daily.  Marland Kitchen tolterodine (DETROL) 2 MG tablet Take 1 tablet (2 mg total) by mouth 2 (two) times daily.  . VENTOLIN HFA 108 (90 Base) MCG/ACT inhaler Inhale 2 puffs into the lungs every 6 (six) hours as needed for wheezi ng or shortness of breath.  . [DISCONTINUED] Fluticasone-Salmeterol (ADVAIR DISKUS) 250-50 MCG/DOSE AEPB Inhale 1 puff into the lungs 2 (two) times daily.   No facility-administered encounter medications on file as of 01/27/2018.     Review of Systems  Constitutional: Negative for appetite change and unexpected weight change.  HENT: Positive for congestion. Negative for sinus pressure.   Respiratory: Positive for cough. Negative for chest tightness.        She feels her breathing is stable.  Some sob, but she feels no significant change from her baseline.    Cardiovascular: Negative for chest pain and leg swelling.  Gastrointestinal: Negative for abdominal pain, nausea and vomiting.  Skin: Negative for color change and rash.  Neurological: Negative for dizziness and headaches.  Psychiatric/Behavioral: Negative for agitation and dysphoric mood.       Objective:    Physical Exam  Constitutional: She appears well-developed and well-nourished. No distress.  HENT:  Nose: Nose normal.   Mouth/Throat: Oropharynx is clear and moist.  Neck: Neck supple.  Cardiovascular: Normal rate and regular rhythm.  Pulmonary/Chest: Breath sounds normal. No respiratory distress.  Some increased cough with deep expiration.   Abdominal: Soft. Bowel sounds are normal. There is no tenderness.  Musculoskeletal: She exhibits no edema or tenderness.  Lymphadenopathy:    She has no cervical adenopathy.  Skin: No rash noted. No erythema.  Psychiatric: She has a normal mood and affect. Her behavior is normal.  BP 136/80 (BP Location: Left Arm, Patient Position: Sitting, Cuff Size: Large)   Pulse 84   Temp 98 F (36.7 C) (Oral)   Resp 18   Wt 187 lb 3.2 oz (84.9 kg)   SpO2 94%   BMI 30.21 kg/m  Wt Readings from Last 3 Encounters:  01/27/18 187 lb 3.2 oz (84.9 kg)  01/26/18 188 lb 6 oz (85.4 kg)  10/24/17 183 lb (83 kg)     Lab Results  Component Value Date   WBC 7.1 06/29/2017   HGB 12.0 06/29/2017   HCT 36.8 06/29/2017   PLT 239 06/29/2017   GLUCOSE 156 (H) 10/21/2017   CHOL 140 10/21/2017   TRIG 146.0 10/21/2017   HDL 41.70 10/21/2017   LDLCALC 69 10/21/2017   ALT 11 10/21/2017   AST 17 10/21/2017   NA 141 10/21/2017   K 4.7 10/21/2017   CL 104 10/21/2017   CREATININE 1.17 10/21/2017   BUN 23 10/21/2017   CO2 28 10/21/2017   TSH 2.69 12/31/2016   HGBA1C 7.4 (H) 10/21/2017   MICROALBUR 10.1 (H) 12/31/2016    Dg Elbow Complete Left  Result Date: 06/29/2017 CLINICAL DATA:  [place]are[place] Seat in bathroom onto LEFT shoulder and elbow, lost her balance, pain EXAM: LEFT ELBOW - COMPLETE 3+ VIEW COMPARISON:  12/16/2009 FINDINGS: Osseous demineralization. Joint spaces preserved. No acute fracture, dislocation, or bone destruction. Proximal radial fracture seen on the previous exam no longer identified. No elbow joint effusion. IMPRESSION: No acute osseous abnormalities. Electronically Signed   By: Ulyses Southward M.D.   On: 06/29/2017 20:25   Dg Shoulder Left  Result Date:  06/29/2017 CLINICAL DATA:  [place] Seat, pain EXAM: LEFT SHOULDER - 2+ VIEW FINDINGS: Comminuted LEFT humeral head fracture. No dislocation. Slight avulsion of the greater tuberosity. Clavicle and acromion appear intact. Osteopenia. IMPRESSION: Comminuted LEFT humeral head fracture without dislocation. Electronically Signed   By: Elsie Stain M.D.   On: 06/29/2017 20:24       Assessment & Plan:   Problem List Items Addressed This Visit    Chronic obstructive airway disease with asthma (HCC)    Sees pulmonary.  On advair.  With cough and congestion and some sob, but she feels is overall stable.  Will increase advair to 500/50.  Continue mucinex in the am and robitussin DM in the pm.  Hold abx.  Check cxr.  Continue f/u with pulmonary.        Relevant Medications   Fluticasone-Salmeterol (ADVAIR DISKUS) 500-50 MCG/DOSE AEPB   Cough    With cough and congestion as outlined.  Increase advair as outlined.  Continue mucinex.  Add robitussin in the evening.  Check cxr.  Hold abx.         Other Visit Diagnoses    Persistent cough    -  Primary   Relevant Orders   DG Chest 2 View (Completed)       Dale Amarillo, MD

## 2018-01-27 NOTE — Patient Instructions (Addendum)
Increase advair to 500/50 twice a day.  Add robitussin - in the evening.

## 2018-01-29 ENCOUNTER — Encounter: Payer: Self-pay | Admitting: Internal Medicine

## 2018-01-29 NOTE — Progress Notes (Signed)
Diamond Newman came in for B12 injection.  Was noticed to be sob after ambulating to the exam room.  Some increased cough and congestion.  I was informed of her symptoms and she was asked to stay and allow me to evaluate her.  I was going to work her in - between pts.  She had to wait for me to work her in. Her and her caretaker then informed me that they could not stay and had to leave.  I instructed them that I felt she needed to be evaluated today and that I would evaluate her now - prior to leaving.  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  I again explained of need for evaluation today, but they insisted on leaving.  She was given a work in appt for tomorrow and left against my medical recommendation.   Dr Lorin Picket

## 2018-01-29 NOTE — Assessment & Plan Note (Signed)
With cough and congestion as outlined.  Increase advair as outlined.  Continue mucinex.  Add robitussin in the evening.  Check cxr.  Hold abx.

## 2018-01-29 NOTE — Assessment & Plan Note (Signed)
Sees pulmonary.  On advair.  With cough and congestion and some sob, but she feels is overall stable.  Will increase advair to 500/50.  Continue mucinex in the am and robitussin DM in the pm.  Hold abx.  Check cxr.  Continue f/u with pulmonary.

## 2018-01-30 ENCOUNTER — Ambulatory Visit: Payer: Medicare Other | Admitting: Internal Medicine

## 2018-01-30 ENCOUNTER — Other Ambulatory Visit: Payer: Self-pay | Admitting: Internal Medicine

## 2018-01-30 DIAGNOSIS — J449 Chronic obstructive pulmonary disease, unspecified: Secondary | ICD-10-CM

## 2018-01-30 DIAGNOSIS — R0602 Shortness of breath: Secondary | ICD-10-CM

## 2018-01-30 DIAGNOSIS — R059 Cough, unspecified: Secondary | ICD-10-CM

## 2018-01-30 DIAGNOSIS — R05 Cough: Secondary | ICD-10-CM

## 2018-01-30 NOTE — Progress Notes (Signed)
Order placed for referral to pulmonary.   

## 2018-01-31 DIAGNOSIS — S42292D Other displaced fracture of upper end of left humerus, subsequent encounter for fracture with routine healing: Secondary | ICD-10-CM | POA: Diagnosis not present

## 2018-02-08 ENCOUNTER — Other Ambulatory Visit: Payer: Self-pay | Admitting: Internal Medicine

## 2018-02-14 ENCOUNTER — Other Ambulatory Visit: Payer: Self-pay | Admitting: Internal Medicine

## 2018-02-23 ENCOUNTER — Other Ambulatory Visit: Payer: Medicare Other

## 2018-02-27 ENCOUNTER — Encounter: Payer: Medicare Other | Admitting: Internal Medicine

## 2018-02-28 ENCOUNTER — Ambulatory Visit (INDEPENDENT_AMBULATORY_CARE_PROVIDER_SITE_OTHER): Payer: Medicare Other | Admitting: Internal Medicine

## 2018-02-28 ENCOUNTER — Encounter: Payer: Self-pay | Admitting: Internal Medicine

## 2018-02-28 ENCOUNTER — Encounter: Payer: Medicare Other | Admitting: Internal Medicine

## 2018-02-28 VITALS — BP 116/72 | HR 102 | Temp 98.1°F | Resp 18 | Wt 186.5 lb

## 2018-02-28 DIAGNOSIS — I10 Essential (primary) hypertension: Secondary | ICD-10-CM

## 2018-02-28 DIAGNOSIS — E7800 Pure hypercholesterolemia, unspecified: Secondary | ICD-10-CM | POA: Diagnosis not present

## 2018-02-28 DIAGNOSIS — R4182 Altered mental status, unspecified: Secondary | ICD-10-CM

## 2018-02-28 DIAGNOSIS — R400 Somnolence: Secondary | ICD-10-CM | POA: Diagnosis not present

## 2018-02-28 DIAGNOSIS — R059 Cough, unspecified: Secondary | ICD-10-CM

## 2018-02-28 DIAGNOSIS — N183 Chronic kidney disease, stage 3 unspecified: Secondary | ICD-10-CM

## 2018-02-28 DIAGNOSIS — E538 Deficiency of other specified B group vitamins: Secondary | ICD-10-CM | POA: Diagnosis not present

## 2018-02-28 DIAGNOSIS — R05 Cough: Secondary | ICD-10-CM | POA: Diagnosis not present

## 2018-02-28 DIAGNOSIS — Z87440 Personal history of urinary (tract) infections: Secondary | ICD-10-CM

## 2018-02-28 DIAGNOSIS — E1151 Type 2 diabetes mellitus with diabetic peripheral angiopathy without gangrene: Secondary | ICD-10-CM

## 2018-02-28 DIAGNOSIS — J449 Chronic obstructive pulmonary disease, unspecified: Secondary | ICD-10-CM | POA: Diagnosis not present

## 2018-02-28 DIAGNOSIS — K219 Gastro-esophageal reflux disease without esophagitis: Secondary | ICD-10-CM | POA: Diagnosis not present

## 2018-02-28 DIAGNOSIS — R0602 Shortness of breath: Secondary | ICD-10-CM | POA: Diagnosis not present

## 2018-02-28 MED ORDER — FLUOXETINE HCL 40 MG PO CAPS: 40.0000 mg | ORAL_CAPSULE | Freq: Every day | ORAL | 2 refills | Status: DC

## 2018-02-28 MED ORDER — PREDNISONE 10 MG PO TABS: ORAL_TABLET | ORAL | 0 refills | Status: DC

## 2018-02-28 MED ORDER — CEFDINIR 300 MG PO CAPS: 300.0000 mg | ORAL_CAPSULE | Freq: Two times a day (BID) | ORAL | 0 refills | Status: DC

## 2018-02-28 NOTE — Progress Notes (Signed)
[name], female   DOB: 04/04/1927, 84 y.o.   MRN: [id number]   Subjective:    [name], female    DOB: 1926/11/26, 84 y.o.   MRN: [id number]  HPI  Patient here for a scheduled follow up.  She is accompanied by her daughter.  History obtained from both of them.  Reports increased cough and congestion recently.  Also some increased sob - worse at times.  Some days hard for her to stay awake.  Has known sleep apnea, but refuses to wear cpap.  Is weak.  Not eating as much.  Some days not able to walk as well.  No chest pain.  No acid reflux.  No abdominal pain.  Bowels moving.  Some depression.  Feels lonely.  Discussed with her and her daughter today.  They talked and pt felt much better after their talk.  More awake and alert.  No vomiting.  Recently had advair increased to 500.   Taking mucinex and robitussin.  Helped for a while.  Worsened recently.  Some days better than others.  Has f/u with Dr Mortimer Fries 03/14/18.    Past Medical History:  Diagnosis Date  . Asthma   . Chronic bronchitis ([place])   . COPD (chronic obstructive pulmonary disease) (HCC)    uses inhalers  . Depression   . Diabetes mellitus ([place])   . GERD (gastroesophageal reflux disease)    hiatal hernia  . Gout   . Hematuria    followed by urology  . History of colon polyps   . History of frequent urinary tract infections   . Humeral fracture 9/09   comminuted impacted proximal  . Hypercholesterolemia   . Hypertension   . Hypothyroidism   . Peripheral vascular disease ([place])   . Sleep apnea    does not use cpap as it did not benefit her   Past Surgical History:  Procedure Laterality Date  . BLADDER SUSPENSION  12/08/99  . BREAST BIOPSY Right 1960's  . CARPAL TUNNEL RELEASE Right 03/17/2017   Procedure: CARPAL TUNNEL RELEASE;  Surgeon: Hessie Knows, MD;  Location: [facility] ORS;  Service: Orthopedics;  Laterality: Right;  . CHOLECYSTECTOMY  1994  . COLONOSCOPY W/ POLYPECTOMY    . EYE SURGERY  Bilateral    cataract extractions  . KNEE SURGERY Right 2013   arthroscopy  . ORIF WRIST FRACTURE Right 03/17/2017   Procedure: OPEN REDUCTION INTERNAL FIXATION (ORIF) WRIST FRACTURE;  Surgeon: Hessie Knows, MD;  Location: [facility] ORS;  Service: Orthopedics;  Laterality: Right;   Family History  Problem Relation Age of Onset  . Hypertension Father   . CVA Father   . CAD Father   . Hypertension Mother   . Colitis Mother   . CAD Mother   . Breast cancer Neg Hx   . Colon cancer Neg Hx    Social History   Socioeconomic History  . Marital status: Widowed    Spouse name: Not on file  . Number of children: 2  . Years of education: Not on file  . Highest education level: Not on file  Occupational History  . Not on file  Social Needs  . Financial resource strain: Not on file  . Food insecurity:    Worry: Not on file    Inability: Not on file  . Transportation needs:    Medical: Not on file    Non-medical: Not on file  Tobacco Use  . Smoking status: Never Smoker  .  Smokeless tobacco: Never Used  Substance and Sexual Activity  . Alcohol use: No    Alcohol/week: 0.0 standard drinks  . Drug use: No  . Sexual activity: Never  Lifestyle  . Physical activity:    Days per week: Not on file    Minutes per session: Not on file  . Stress: Not on file  Relationships  . Social connections:    Talks on phone: Not on file    Gets together: Not on file    Attends religious service: Not on file    Active member of club or organization: Not on file    Attends meetings of clubs or organizations: Not on file    Relationship status: Not on file  Other Topics Concern  . Not on file  Social History Narrative  . Not on file    Outpatient Encounter Medications as of 02/28/2018  Medication Sig  . allopurinol (ZYLOPRIM) 100 MG tablet Take 1 tablet (100 mg total) by mouth daily.  . Calcium Carbonate-Vitamin D (CALCIUM 600+D) 600-400 MG-UNIT per tablet Take 1 tablet by mouth daily.   .  cetirizine (ZYRTEC) 10 MG tablet Take 10 mg by mouth daily.  . Cholecalciferol (VITAMIN D-3) 1000 UNITS CAPS Take 1 capsule by mouth daily.  . Cranberry-Cholecalciferol 4200-500 MG-UNIT CAPS Take 1 capsule by mouth daily.   . Fluticasone-Salmeterol (ADVAIR DISKUS) 500-50 MCG/DOSE AEPB Inhale 1 puff into the lungs 2 (two) times daily.  Marland Kitchen levothyroxine (SYNTHROID, LEVOTHROID) 50 MCG tablet Take 1 tablet (50 mcg total) by mouth daily before breakfast.  . methenamine (HIPREX) 1 g tablet Take 1 tablet (1 g total) by mouth daily.  . montelukast (SINGULAIR) 10 MG tablet Take 1 tablet (10 mg total) by mouth at bedtime.  Marland Kitchen nystatin (MYCOSTATIN/NYSTOP) powder Apply topically 2 (two) times daily.  Marland Kitchen nystatin cream (MYCOSTATIN) Apply 1 application 2 (two) times daily topically.  . pantoprazole (PROTONIX) 40 MG tablet Take 1 tablet (40 mg total) by mouth daily.  . simvastatin (ZOCOR) 20 MG tablet Take 1 tablet (20 mg total) by mouth daily.  Marland Kitchen tolterodine (DETROL) 2 MG tablet Take 1 tablet (2 mg total) by mouth 2 (two) times daily.  . VENTOLIN HFA 108 (90 Base) MCG/ACT inhaler Inhale 2 puffs into the lungs every 6 (six) hours as needed for wheezi ng or shortness of breath.  . [DISCONTINUED] FLUoxetine (PROZAC) 20 MG capsule Take 1 capsule (20 mg total) by mouth daily.  . cefdinir (OMNICEF) 300 MG capsule Take 1 capsule (300 mg total) by mouth 2 (two) times daily.  Marland Kitchen FLUoxetine (PROZAC) 40 MG capsule Take 1 capsule (40 mg total) by mouth daily.  . predniSONE (DELTASONE) 10 MG tablet Take 4 tablets x 1 day and then decrease by 1/2 tablet per day until down to zero mg.   No facility-administered encounter medications on file as of 02/28/2018.     Review of Systems  Constitutional: Negative for unexpected weight change.       Some decreased appetite.    HENT: Positive for congestion. Negative for sinus pressure.   Respiratory: Positive for cough and shortness of breath. Negative for chest tightness.     Cardiovascular: Negative for chest pain, palpitations and leg swelling.  Gastrointestinal: Negative for abdominal pain, diarrhea, nausea and vomiting.  Genitourinary: Negative for difficulty urinating and hematuria.  Musculoskeletal: Negative for joint swelling and myalgias.  Skin: Negative for color change and rash.  Neurological: Negative for dizziness, light-headedness and headaches.  Psychiatric/Behavioral: Negative for agitation.  Some increased depression.  Feels lonely.         Objective:    Physical Exam  Constitutional: She appears well-developed and well-nourished. No distress.  HENT:  Nose: Nose normal.  Mouth/Throat: Oropharynx is clear and moist.  Neck: Neck supple. No thyromegaly present.  Cardiovascular: Normal rate and regular rhythm.  Pulmonary/Chest: Breath sounds normal. No respiratory distress.  Increased cough with forced expiration.  Some wheezing.    Abdominal: Soft. Bowel sounds are normal. There is no tenderness.  Musculoskeletal: She exhibits no edema or tenderness.  Lymphadenopathy:    She has no cervical adenopathy.  Skin: No rash noted. No erythema.  Psychiatric: She has a normal mood and affect. Her behavior is normal.    BP 116/72   Pulse (!) 102   Temp 98.1 F (36.7 C) (Oral)   Resp 18   Wt 186 lb 8 oz (84.6 kg)   SpO2 94%   BMI 30.10 kg/m  Wt Readings from Last 3 Encounters:  02/28/18 186 lb 8 oz (84.6 kg)  01/27/18 187 lb 3.2 oz (84.9 kg)  01/26/18 188 lb 6 oz (85.4 kg)     Lab Results  Component Value Date   WBC 12.3 (H) 02/28/2018   HGB 11.7 (L) 02/28/2018   HCT 37.5 02/28/2018   PLT 298.0 02/28/2018   GLUCOSE 103 (H) 02/28/2018   CHOL 140 10/21/2017   TRIG 146.0 10/21/2017   HDL 41.70 10/21/2017   LDLCALC 69 10/21/2017   ALT 37 (H) 02/28/2018   AST 41 (H) 02/28/2018   NA 135 02/28/2018   K 5.1 02/28/2018   CL 101 02/28/2018   CREATININE 1.17 02/28/2018   BUN 30 (H) 02/28/2018   CO2 25 02/28/2018   TSH 2.69  12/31/2016   HGBA1C 7.4 (H) 10/21/2017   MICROALBUR 10.1 (H) 12/31/2016    Dg Elbow Complete Left  Result Date: 06/29/2017 CLINICAL DATA:  [place] in bathroom onto LEFT shoulder and elbow, lost her balance, pain EXAM: LEFT ELBOW - COMPLETE 3+ VIEW COMPARISON:  12/16/2009 FINDINGS: Osseous demineralization. Joint spaces preserved. No acute fracture, dislocation, or bone destruction. Proximal radial fracture seen on the previous exam no longer identified. No elbow joint effusion. IMPRESSION: No acute osseous abnormalities. Electronically Signed   By: Lavonia Dana M.D.   On: 06/29/2017 20:25   Dg Shoulder Left  Result Date: 06/29/2017 CLINICAL DATA:  [place], pain EXAM: LEFT SHOULDER - 2+ VIEW FINDINGS: Comminuted LEFT humeral head fracture. No dislocation. Slight avulsion of the greater tuberosity. Clavicle and acromion appear intact. Osteopenia. IMPRESSION: Comminuted LEFT humeral head fracture without dislocation. Electronically Signed   By: Staci Righter M.D.   On: 06/29/2017 20:24       Assessment & Plan:   Problem List Items Addressed This Visit    Chronic obstructive airway disease with asthma ([place])    With increased cough and congestion as outlined.  Using advair.  Continue mucinex and inhalers.  Check cxr.  Treat with omnicef and prednisone taper as directed.  Follow closely.        Relevant Medications   predniSONE (DELTASONE) 10 MG tablet   CKD (chronic kidney disease) stage 3, GFR 30-59 ml/min (HCC)    Follow metabolic panel.  Avoid antiinflammatories.        Cough    Increased cough and congestion as outlined.  Check cxr.  Treat with abx and prednisone taper as directed.  Continue inhalers as she is doing.  Follow.  Daytime somnolence    Treat current infection as outlined.  Check urine to confirm no infection.  Has known sleep apnea.  Refused cpap.  Follow.        Relevant Orders   Urinalysis, Routine w reflex microscopic (Completed)   Diabetes mellitus with peripheral  vascular disease (HCC)    Low carb diet.  Will need to monitor sugars on prednisone.  Follow met b and a1c.        Relevant Orders   Hepatic function panel (Completed)   Basic metabolic panel (Completed)   GERD (gastroesophageal reflux disease)    Controlled on current regimen.  Follow.        Hypercholesterolemia    On simvastatin.  Low cholesterol diet.  Follow lipid panel and liver function tests.        Hypertension    Blood pressure under good control.  Continue same medication regimen.  Follow pressures.  Follow metabolic panel.        Vitamin B12 deficiency    Continue b12 injections.         Other Visit Diagnoses    SOB (shortness of breath)    -  Primary   Relevant Orders   EKG 12-Lead (Completed)   CBC with Differential/Platelet (Completed)   History of UTI       Relevant Medications   cefdinir (OMNICEF) 300 MG capsule   Altered mental status, unspecified altered mental status type       Relevant Orders   Urine Culture (Completed)      I spent 40 minutes with the patient and more than 50% of the time was spent in consultation regarding the above.  Time spent discussing current symptoms and concerns.  Time also spent discussing treatment options and further evaluation.     Einar Pheasant, MD

## 2018-03-01 ENCOUNTER — Other Ambulatory Visit: Payer: Self-pay | Admitting: Internal Medicine

## 2018-03-01 DIAGNOSIS — R945 Abnormal results of liver function studies: Secondary | ICD-10-CM

## 2018-03-01 DIAGNOSIS — R7989 Other specified abnormal findings of blood chemistry: Secondary | ICD-10-CM

## 2018-03-01 LAB — BASIC METABOLIC PANEL
BUN: 30 mg/dL — ABNORMAL HIGH (ref 6–23)
CALCIUM: 9.4 mg/dL (ref 8.4–10.5)
CO2: 25 meq/L (ref 19–32)
CREATININE: 1.17 mg/dL (ref 0.40–1.20)
Chloride: 101 mEq/L (ref 96–112)
GFR: 46.26 mL/min — AB (ref >60.00)
GLUCOSE: 103 mg/dL — AB (ref 70–99)
Potassium: 5.1 mEq/L (ref 3.5–5.1)
Sodium: 135 mEq/L (ref 135–145)

## 2018-03-01 LAB — URINALYSIS, ROUTINE W REFLEX MICROSCOPIC
Bilirubin Urine: NEGATIVE
KETONES UR: NEGATIVE
Nitrite: POSITIVE — AB
Specific Gravity, Urine: 1.01 (ref 1.000–1.030)
TOTAL PROTEIN, URINE-UPE24: NEGATIVE
URINE GLUCOSE: NEGATIVE
UROBILINOGEN UA: 0.2 (ref 0.0–1.0)
pH: 6 (ref 5.0–8.0)

## 2018-03-01 LAB — CBC WITH DIFFERENTIAL/PLATELET
BASOS PCT: 0.9 % (ref 0.0–3.0)
Basophils Absolute: 0.1 10*3/uL (ref 0.0–0.1)
EOS ABS: 0.3 10*3/uL (ref 0.0–0.7)
EOS PCT: 2.4 % (ref 0.0–5.0)
HCT: 37.5 % (ref 36.0–46.0)
HEMOGLOBIN: 11.7 g/dL — AB (ref 12.0–15.0)
LYMPHS ABS: 2.4 10*3/uL (ref 0.7–4.0)
Lymphocytes Relative: 19.6 % (ref 12.0–46.0)
MCHC: 31.1 g/dL (ref 30.0–36.0)
MCV: 96.9 fl (ref 78.0–100.0)
MONO ABS: 1.2 10*3/uL — AB (ref 0.1–1.0)
Monocytes Relative: 10 % (ref 3.0–12.0)
NEUTROS ABS: 8.2 10*3/uL — AB (ref 1.4–7.7)
NEUTROS PCT: 67.1 % (ref 43.0–77.0)
PLATELETS: 298 10*3/uL (ref 150.0–400.0)
RBC: 3.87 Mil/uL (ref 3.87–5.11)
RDW: 15.8 % — AB (ref 11.5–15.5)
WBC: 12.3 10*3/uL — ABNORMAL HIGH (ref 4.0–10.5)

## 2018-03-01 LAB — HEPATIC FUNCTION PANEL
ALT: 37 U/L — AB (ref 0–35)
AST: 41 U/L — ABNORMAL HIGH (ref 0–37)
Albumin: 3.4 g/dL — ABNORMAL LOW (ref 3.5–5.2)
Alkaline Phosphatase: 105 U/L (ref 39–117)
BILIRUBIN DIRECT: 0.2 mg/dL (ref 0.0–0.3)
Total Bilirubin: 0.5 mg/dL (ref 0.2–1.2)
Total Protein: 7.2 g/dL (ref 6.0–8.3)

## 2018-03-01 NOTE — Progress Notes (Signed)
Order placed for f/u liver panel.  

## 2018-03-02 LAB — URINE CULTURE
MICRO NUMBER:: 91208618
SPECIMEN QUALITY: ADEQUATE

## 2018-03-03 ENCOUNTER — Encounter: Payer: Self-pay | Admitting: Internal Medicine

## 2018-03-05 ENCOUNTER — Encounter: Payer: Self-pay | Admitting: Internal Medicine

## 2018-03-05 NOTE — Assessment & Plan Note (Signed)
Continue b12 injections.  

## 2018-03-05 NOTE — Assessment & Plan Note (Signed)
Treat current infection as outlined.  Check urine to confirm no infection.  Has known sleep apnea.  Refused cpap.  Follow.

## 2018-03-05 NOTE — Assessment & Plan Note (Signed)
Controlled on current regimen.  Follow.  

## 2018-03-05 NOTE — Assessment & Plan Note (Signed)
Follow metabolic panel.  Avoid antiinflammatories.   

## 2018-03-05 NOTE — Assessment & Plan Note (Signed)
Blood pressure under good control.  Continue same medication regimen.  Follow pressures.  Follow metabolic panel.   

## 2018-03-05 NOTE — Assessment & Plan Note (Signed)
Low carb diet.  Will need to monitor sugars on prednisone.  Follow met b and a1c.

## 2018-03-05 NOTE — Assessment & Plan Note (Signed)
On simvastatin.  Low cholesterol diet.  Follow lipid panel and liver function tests.   

## 2018-03-05 NOTE — Assessment & Plan Note (Signed)
Increased cough and congestion as outlined.  Check cxr.  Treat with abx and prednisone taper as directed.  Continue inhalers as she is doing.  Follow.

## 2018-03-05 NOTE — Assessment & Plan Note (Signed)
With increased cough and congestion as outlined.  Using advair.  Continue mucinex and inhalers.  Check cxr.  Treat with omnicef and prednisone taper as directed.  Follow closely.

## 2018-03-13 ENCOUNTER — Other Ambulatory Visit (INDEPENDENT_AMBULATORY_CARE_PROVIDER_SITE_OTHER): Payer: Medicare Other

## 2018-03-13 DIAGNOSIS — E1151 Type 2 diabetes mellitus with diabetic peripheral angiopathy without gangrene: Secondary | ICD-10-CM | POA: Diagnosis not present

## 2018-03-13 DIAGNOSIS — E7800 Pure hypercholesterolemia, unspecified: Secondary | ICD-10-CM

## 2018-03-13 DIAGNOSIS — E559 Vitamin D deficiency, unspecified: Secondary | ICD-10-CM | POA: Diagnosis not present

## 2018-03-13 DIAGNOSIS — N183 Chronic kidney disease, stage 3 unspecified: Secondary | ICD-10-CM

## 2018-03-13 DIAGNOSIS — I10 Essential (primary) hypertension: Secondary | ICD-10-CM | POA: Diagnosis not present

## 2018-03-13 DIAGNOSIS — R945 Abnormal results of liver function studies: Secondary | ICD-10-CM | POA: Diagnosis not present

## 2018-03-13 DIAGNOSIS — R7989 Other specified abnormal findings of blood chemistry: Secondary | ICD-10-CM

## 2018-03-13 LAB — HEPATIC FUNCTION PANEL
ALT: 16 U/L (ref 0–35)
AST: 14 U/L (ref 0–37)
Albumin: 3.6 g/dL (ref 3.5–5.2)
Alkaline Phosphatase: 65 U/L (ref 39–117)
BILIRUBIN DIRECT: 0.1 mg/dL (ref 0.0–0.3)
BILIRUBIN TOTAL: 0.5 mg/dL (ref 0.2–1.2)
TOTAL PROTEIN: 6.9 g/dL (ref 6.0–8.3)

## 2018-03-13 LAB — TSH: TSH: 3.68 u[IU]/mL (ref 0.35–4.50)

## 2018-03-13 LAB — BASIC METABOLIC PANEL
BUN: 36 mg/dL — ABNORMAL HIGH (ref 6–23)
CALCIUM: 9.7 mg/dL (ref 8.4–10.5)
CHLORIDE: 99 meq/L (ref 96–112)
CO2: 27 meq/L (ref 19–32)
Creatinine, Ser: 1.28 mg/dL — ABNORMAL HIGH (ref 0.40–1.20)
GFR: 41.7 mL/min — ABNORMAL LOW (ref >60.00)
GLUCOSE: 192 mg/dL — AB (ref 70–99)
POTASSIUM: 4.8 meq/L (ref 3.5–5.1)
SODIUM: 137 meq/L (ref 135–145)

## 2018-03-13 LAB — LIPID PANEL
CHOLESTEROL: 137 mg/dL (ref 0–200)
HDL: 44.9 mg/dL (ref >39.00)
LDL CALC: 55 mg/dL (ref 0–99)
NonHDL: 92.29
TRIGLYCERIDES: 186 mg/dL — AB (ref 0.0–149.0)
Total CHOL/HDL Ratio: 3
VLDL: 37.2 mg/dL (ref 0.0–40.0)

## 2018-03-13 LAB — HEMOGLOBIN A1C: HEMOGLOBIN A1C: 7.9 % — AB (ref 4.6–6.5)

## 2018-03-13 LAB — VITAMIN D 25 HYDROXY (VIT D DEFICIENCY, FRACTURES): VITD: 61.87 ng/mL (ref 30.00–100.00)

## 2018-03-14 ENCOUNTER — Emergency Department
Admission: EM | Admit: 2018-03-14 | Discharge: 2018-03-14 | Disposition: A | Discharge status: Home / Self Care | Payer: Medicare Other | Attending: Emergency Medicine | Admitting: Emergency Medicine

## 2018-03-14 ENCOUNTER — Ambulatory Visit (INDEPENDENT_AMBULATORY_CARE_PROVIDER_SITE_OTHER): Payer: Medicare Other | Admitting: Internal Medicine

## 2018-03-14 ENCOUNTER — Encounter: Payer: Self-pay | Admitting: Internal Medicine

## 2018-03-14 ENCOUNTER — Emergency Department: Payer: Medicare Other

## 2018-03-14 ENCOUNTER — Other Ambulatory Visit: Payer: Self-pay

## 2018-03-14 VITALS — BP 98/62 | HR 99 | Resp 16 | Ht 66.0 in | Wt 185.0 lb

## 2018-03-14 DIAGNOSIS — Z79899 Other long term (current) drug therapy: Secondary | ICD-10-CM | POA: Diagnosis not present

## 2018-03-14 DIAGNOSIS — N183 Chronic kidney disease, stage 3 (moderate): Secondary | ICD-10-CM | POA: Diagnosis not present

## 2018-03-14 DIAGNOSIS — J9601 Acute respiratory failure with hypoxia: Secondary | ICD-10-CM

## 2018-03-14 DIAGNOSIS — J449 Chronic obstructive pulmonary disease, unspecified: Secondary | ICD-10-CM | POA: Diagnosis not present

## 2018-03-14 DIAGNOSIS — E039 Hypothyroidism, unspecified: Secondary | ICD-10-CM | POA: Diagnosis not present

## 2018-03-14 DIAGNOSIS — I129 Hypertensive chronic kidney disease with stage 1 through stage 4 chronic kidney disease, or unspecified chronic kidney disease: Secondary | ICD-10-CM | POA: Diagnosis not present

## 2018-03-14 DIAGNOSIS — E119 Type 2 diabetes mellitus without complications: Secondary | ICD-10-CM | POA: Insufficient documentation

## 2018-03-14 DIAGNOSIS — R0602 Shortness of breath: Secondary | ICD-10-CM | POA: Diagnosis not present

## 2018-03-14 LAB — CBC
HCT: 40.2 % (ref 36.0–46.0)
Hemoglobin: 12.6 g/dL (ref 12.0–15.0)
MCH: 30.3 pg (ref 26.0–34.0)
MCHC: 31.3 g/dL (ref 30.0–36.0)
MCV: 96.6 fL (ref 80.0–100.0)
Platelets: 300 10*3/uL (ref 150–400)
RBC: 4.16 MIL/uL (ref 3.87–5.11)
RDW: 14.5 % (ref 11.5–15.5)
WBC: 12.5 10*3/uL — AB (ref 4.0–10.5)
nRBC: 0 % (ref 0.0–0.2)

## 2018-03-14 LAB — BASIC METABOLIC PANEL
Anion gap: 9 (ref 5–15)
BUN: 34 mg/dL — AB (ref 8–23)
CALCIUM: 9.1 mg/dL (ref 8.9–10.3)
CO2: 24 mmol/L (ref 22–32)
CREATININE: 1.27 mg/dL — AB (ref 0.44–1.00)
Chloride: 101 mmol/L (ref 98–111)
GFR calc Af Amer: 42 mL/min — ABNORMAL LOW (ref >60)
GFR, EST NON AFRICAN AMERICAN: 36 mL/min — AB (ref >60)
Glucose, Bld: 278 mg/dL — ABNORMAL HIGH (ref 70–99)
Potassium: 4.4 mmol/L (ref 3.5–5.1)
SODIUM: 134 mmol/L — AB (ref 135–145)

## 2018-03-14 LAB — BRAIN NATRIURETIC PEPTIDE: B NATRIURETIC PEPTIDE 5: 39 pg/mL (ref 0.0–100.0)

## 2018-03-14 LAB — TROPONIN I
TROPONIN I: 0.03 ng/mL — AB (ref <0.03)
Troponin I: 0.03 ng/mL (ref <0.03)

## 2018-03-14 MED ORDER — METHYLPREDNISOLONE SODIUM SUCC 125 MG IJ SOLR
125.0000 mg | Freq: Once | INTRAMUSCULAR | Status: AC
  Administered 2018-03-14: 125 mg via INTRAVENOUS
  Filled 2018-03-14: qty 2

## 2018-03-14 MED ORDER — IPRATROPIUM-ALBUTEROL 0.5-2.5 (3) MG/3ML IN SOLN
3.0000 mL | Freq: Once | RESPIRATORY_TRACT | Status: AC
  Administered 2018-03-14: 3 mL via RESPIRATORY_TRACT
  Filled 2018-03-14: qty 3

## 2018-03-14 NOTE — ED Notes (Signed)
Pt given drink and crackers with peanut butter. Provided for comfort and safety and will continue to assess. Family at the bedside.

## 2018-03-14 NOTE — ED Provider Notes (Signed)
[facility] Emergency Department Provider Note ____________________________________________   First MD Initiated Contact with Patient 03/14/18 1842     (approximate)  I have reviewed the triage vital signs and the nursing notes.   HISTORY  Chief Complaint Shortness of Breath    HPI Diamond Newman is a 84 y.o. female with PMH as noted below including COPD who presents with shortness of breath, gradual onset over the last several days to a week, associated with hypoxia today.  The patient saw her pulmonologist today and was noted to be hypoxic to the 80s.  She was instructed to come to the emergency department for further evaluation.  The patient reports some chronic leg swelling.  She also has a cough.  She denies fever, chest pain, or vomiting.   Past Medical History:  Diagnosis Date  . Asthma   . Chronic bronchitis (HCC)   . COPD (chronic obstructive pulmonary disease) (HCC)    uses inhalers  . Depression   . Diabetes mellitus (HCC)   . GERD (gastroesophageal reflux disease)    hiatal hernia  . Gout   . Hematuria    followed by urology  . History of colon polyps   . History of frequent urinary tract infections   . Humeral fracture 9/09   comminuted impacted proximal  . Hypercholesterolemia   . Hypertension   . Hypothyroidism   . Peripheral vascular disease (HCC)   . Sleep apnea    does not use cpap as it did not benefit her    Patient Active Problem List   Diagnosis Date Noted  . Sleep apnea 09/27/2016  . Bronchitis 07/06/2016  . Daytime somnolence 08/03/2015  . Fall against object 12/30/2014  . CKD (chronic kidney disease) stage 3, GFR 30-59 ml/min (HCC) 11/24/2014  . Abnormal mammogram 01/25/2014  . Muscular deconditioning 12/03/2013  . Leukocytosis 11/27/2013  . Chronic obstructive airway disease with asthma (HCC) 08/14/2013  . Lower extremity edema 08/05/2013  . Cough 04/22/2013  . Disorder of kidney and ureter 07/28/2012  .  Urinary urgency 07/28/2012  . Vitamin B12 deficiency 07/18/2012  . Osteopenia 05/27/2012  . Vitamin D deficiency 05/27/2012  . Hypertension 05/25/2012  . Hypercholesterolemia 05/25/2012  . Peripheral vascular disease (HCC) 05/25/2012  . History of frequent urinary tract infections 05/25/2012  . GERD (gastroesophageal reflux disease) 05/25/2012  . Diabetes mellitus with peripheral vascular disease (HCC) 05/25/2012  . Gout 05/25/2012  . Incontinence of feces 06/03/2011  . Mixed urge and stress incontinence 03/22/2011  . Atrophic vaginitis 03/22/2011    Past Surgical History:  Procedure Laterality Date  . BLADDER SUSPENSION  12/08/99  . BREAST BIOPSY Right 1960's  . CARPAL TUNNEL RELEASE Right 03/17/2017   Procedure: CARPAL TUNNEL RELEASE;  Surgeon: Kennedy Bucker, MD;  Location: [facility] ORS;  Service: Orthopedics;  Laterality: Right;  . CHOLECYSTECTOMY  1994  . COLONOSCOPY W/ POLYPECTOMY    . EYE SURGERY Bilateral    cataract extractions  . KNEE SURGERY Right 2013   arthroscopy  . ORIF WRIST FRACTURE Right 03/17/2017   Procedure: OPEN REDUCTION INTERNAL FIXATION (ORIF) WRIST FRACTURE;  Surgeon: Kennedy Bucker, MD;  Location: [facility] ORS;  Service: Orthopedics;  Laterality: Right;    Prior to Admission medications   Medication Sig Start Date End Date Taking? Authorizing Provider  allopurinol (ZYLOPRIM) 100 MG tablet Take 1 tablet (100 mg total) by mouth daily. 09/23/17  Yes Dale [place], MD  Calcium Carbonate-Vitamin D (CALCIUM 600+D) 600-400 MG-UNIT per tablet Take 1  tablet by mouth daily.    Yes [provider]  cetirizine (ZYRTEC) 10 MG tablet Take 10 mg by mouth daily.   Yes [provider]  Cholecalciferol (VITAMIN D-3) 1000 UNITS CAPS Take 1 capsule by mouth daily.   Yes [provider]  Cranberry-Cholecalciferol 4200-500 MG-UNIT CAPS Take 1 capsule by mouth daily.    Yes [provider]  FLUoxetine (PROZAC) 40 MG capsule Take 1 capsule (40 mg  total) by mouth daily. 02/28/18  Yes Dale [place], MD  Fluticasone-Salmeterol (ADVAIR DISKUS) 500-50 MCG/DOSE AEPB Inhale 1 puff into the lungs 2 (two) times daily. 01/27/18  Yes Dale [place], MD  levothyroxine (SYNTHROID, LEVOTHROID) 50 MCG tablet Take 1 tablet (50 mcg total) by mouth daily before breakfast. 01/16/18  Yes Dale [place], MD  methenamine (HIPREX) 1 g tablet Take 1 tablet (1 g total) by mouth daily. 11/30/17  Yes Dale [place], MD  montelukast (SINGULAIR) 10 MG tablet Take 1 tablet (10 mg total) by mouth at bedtime. 11/15/17  Yes Dale [place], MD  nystatin (MYCOSTATIN/NYSTOP) powder Apply topically 2 (two) times daily. 02/17/18  Yes Dale [place], MD  nystatin cream (MYCOSTATIN) Apply 1 application 2 (two) times daily topically. 12/02/17  Yes Dale [place], MD  pantoprazole (PROTONIX) 40 MG tablet Take 1 tablet (40 mg total) by mouth daily. 12/06/17  Yes Dale [place], MD  simvastatin (ZOCOR) 20 MG tablet Take 1 tablet (20 mg total) by mouth daily. 08/26/17  Yes Dale [place], MD  tolterodine (DETROL) 2 MG tablet Take 1 tablet (2 mg total) by mouth 2 (two) times daily. 12/06/17  Yes Dale [place], MD  VENTOLIN HFA 108 (90 Base) MCG/ACT inhaler Inhale 2 puffs into the lungs every 6 (six) hours as needed for wheezi ng or shortness of breath. 02/08/18  Yes Dale [place], MD  cefdinir (OMNICEF) 300 MG capsule Take 1 capsule (300 mg total) by mouth 2 (two) times daily. Patient not taking: Reported on 03/14/2018 02/28/18   Dale [place], MD    Allergies Ace inhibitors; Amoxicillin; Ciprofloxacin hcl; Codeine; Sulfa antibiotics; Tetracyclines & related; and Vibramycin [doxycycline calcium]  Family History  Problem Relation Age of Onset  . Hypertension Father   . CVA Father   . CAD Father   . Hypertension Mother   . Colitis Mother   . CAD Mother   . Breast cancer Neg Hx   . Colon cancer Neg Hx     Social History Social History   Tobacco Use  . Smoking status:  Never Smoker  . Smokeless tobacco: Never Used  Substance Use Topics  . Alcohol use: No    Alcohol/week: 0.0 standard drinks  . Drug use: No    Review of Systems  Constitutional: No fever. Eyes: No redness. ENT: No sore throat. Cardiovascular: Denies chest pain. Respiratory: Positive for shortness of breath. Gastrointestinal: No vomiting.  Genitourinary: Negative for dysuria.  Musculoskeletal: Negative for back pain. Skin: Negative for rash. Neurological: Negative for headache.   ____________________________________________   PHYSICAL EXAM:  VITAL SIGNS: ED Triage Vitals  Enc Vitals Group     BP 03/14/18 1510 (!) 131/57     Pulse Rate 03/14/18 1510 99     Resp 03/14/18 1510 18     Temp 03/14/18 1510 97.7 F (36.5 C)     Temp Source 03/14/18 1820 Oral     SpO2 03/14/18 1510 96 %     Weight 03/14/18 1511 185 lb (83.9 kg)     Height 03/14/18 1511 5\' 5"  (1.651  m)     Head Circumference --      Peak Flow --      Pain Score 03/14/18 1510 0     Pain Loc --      Pain Edu? --      Excl. in GC? --     Constitutional: Alert and oriented.  Relatively well appearing and in no acute distress. Eyes: Conjunctivae are normal.  Head: Atraumatic. Nose: No congestion/rhinnorhea. Mouth/Throat: Mucous membranes are moist.   Neck: Normal range of motion.  Cardiovascular: Normal rate, regular rhythm. Grossly normal heart sounds.  Good peripheral circulation. Respiratory: Normal respiratory effort.  No retractions.  Decreased breath sounds bilaterally with scattered wheezes.  No significant rales. Gastrointestinal: No distention.  Musculoskeletal: Trace bilateral lower extremity edema.  Extremities warm and well perfused.  Neurologic:  Normal speech and language. No gross focal neurologic deficits are appreciated.  Skin:  Skin is warm and dry. No rash noted. Psychiatric: Mood and affect are normal. Speech and behavior are normal.  ____________________________________________     LABS (all labs ordered are listed, but only abnormal results are displayed)  Labs Reviewed  BASIC METABOLIC PANEL - Abnormal; Notable for the following components:      Result Value   Sodium 134 (*)    Glucose, Bld 278 (*)    BUN 34 (*)    Creatinine, Ser 1.27 (*)    GFR calc non Af Amer 36 (*)    GFR calc Af Amer 42 (*)    All other components within normal limits  CBC - Abnormal; Notable for the following components:   WBC 12.5 (*)    All other components within normal limits  TROPONIN I - Abnormal; Notable for the following components:   Troponin I 0.03 (*)    All other components within normal limits  TROPONIN I - Abnormal; Notable for the following components:   Troponin I 0.03 (*)    All other components within normal limits  BRAIN NATRIURETIC PEPTIDE   ____________________________________________  EKG  ED ECG REPORT I, Dionne Bucy, the attending physician, personally viewed and interpreted this ECG.  Date: 03/14/2018 EKG Time: 1516 Rate: 101 Rhythm: Sinus tachycardia QRS Axis: normal Intervals: Incomplete RBBB, LAFB ST/T Wave abnormalities: normal Narrative Interpretation: no evidence of acute ischemia  ____________________________________________  RADIOLOGY  CXR: Bilateral bronchitic changes no focal infiltrate and no acute appearing edema  ____________________________________________   PROCEDURES  Procedure(s) performed: No  Procedures  Critical Care performed: No ____________________________________________   INITIAL IMPRESSION / ASSESSMENT AND PLAN / ED COURSE  Pertinent labs & imaging results that were available during my care of the patient were reviewed by me and considered in my medical decision making (see chart for details).  84 year old female with history of COPD presents with worsening shortness of breath, referred from the pulmonologist office after she was noted to be hypoxic today.  I reviewed the past medical records  in epic; the patient has not been admitted or seen in the ED in the last several months.  She saw Dr. Wallis Bamberg today and was noted to be hypoxic with lower lobe crackles on exam and concern for lower extremity swelling.  The concern was for possible congestive heart failure.  The patient is apparently already on a prednisone taper.  On exam, the patient is currently well-appearing.  Her vital signs are normal except for hypertension.  Her O2 saturation on room air is in the high 90s.  She has mainly wheezes scattered bilaterally but  no other significant abnormal breath sounds.  I do not note significant edema at this time.  Initial labs obtained from triage show borderline elevated WBC count (probably due to steroids) and minimally elevated troponin.  Chest x-ray shows chronic bronchitic changes with no acute infiltrate and no evidence of pulmonary edema.  Overall the presentation appears to be more consistent with acute on chronic COPD rather than acute CHF or other cardiac etiology.  We will obtain a repeat troponin, a BNP, and treat with nebs and an IV steroid dose.  If the patient continues to remain comfortable appearing and has no recurrent hypoxia, she likely will be able to go home with close outpatient follow-up.  ----------------------------------------- 12:45 AM on 03/15/2018 -----------------------------------------  The patient had no recurrent hypoxia or any symptoms while in the ED.  Her initial troponin was 0.03 and I had no baseline to compare.  I obtained a delta troponin after 3 hours which was the same.  The patient has had no symptoms to suggest ACS.  I discussed the results of the work-up with the patient and her daughter who had accompanied her to the doctor's office today.  The patient wanted to go home, and the daughter felt comfortable with this.  The patient was stable for discharge.  Return precautions were given, and the patient and her daughter expressed  understanding.  ____________________________________________   FINAL CLINICAL IMPRESSION(S) / ED DIAGNOSES  Final diagnoses:  Chronic obstructive pulmonary disease, unspecified COPD type (HCC)      NEW MEDICATIONS STARTED DURING THIS VISIT:  Discharge Medication List as of 03/14/2018  9:39 PM       Note:  This document was prepared using Dragon voice recognition software and may include unintentional dictation errors.    Dionne Bucy, MD 03/15/18 [phone]

## 2018-03-14 NOTE — ED Triage Notes (Signed)
Pt arrives to ED from pulmonologist office for low 02. State was able to get oxygen levels up, was in 80's per family. Pt c/o SOB when moving around and weakness. A&O, in wheelchair. Appears shaky.

## 2018-03-14 NOTE — Patient Instructions (Signed)
RECOMMEND VISIT TO ER FOR FURTHER ASSESSMENT  LIKELY DIAGNOSIS IS SYSTOLIC?DIASTOLIC CARDIAC DYSFUNCTION

## 2018-03-14 NOTE — Progress Notes (Signed)
Subjective:    [name], female    DOB: [birth year], 84 y.o.   MRN: [id number]  Synopsis: First referred to the [facility] pulmonary in 2015 for COPD  She never smoked. 11/10/2013 chest x-ray normal  06/2011 PFT [facility]> Ratio 70%, Small Airways disease on loop, FEV1 1.63L (93% pred), FEF 25-75% 0.90 (68% pred), TLC 4.81 L (98% pred), DLCO 9.5 (44% pred) 07/2013 PFT > Loop> SMALL AIRWAYS DISEASE; Ratio 70%, FEV1 1.49L (81% pred, 1% change with BD), FEF 25-75% 0.83 (42% pred), TLC 4.59 L (90% pred), DLCO 10.8 (50% pred) 02/2014 490 feet, 95% RA  Chief Complaint  Patient presents with  . COPD    HPI Patient here for acute respiratory distress Worsening over the last several weeks Patient was given prednisone taper along with antibiotics for UTI for the past week Patient symptoms have not gotten better Patient has increased work of breathing and shortness of breath Respiratory rate into the 20s Patient has crackles on examination Has a history of lower extremity swelling Findings to suggest CHF exacerbation  Patient will likely need further evaluation and admitted to the hospital for work-up for CHF  Daughter at visit understands the plan of care   Review of Systems  Constitutional: Positive for fatigue. Negative for chills and fever.  HENT: Negative for postnasal drip, rhinorrhea and sinus pressure.   Respiratory: Positive for shortness of breath. Negative for cough and wheezing.        Mild resp distress, increased WOB  Cardiovascular: Positive for leg swelling. Negative for chest pain and palpitations.  All other systems reviewed and are negative.      Objective   Physical Exam  Constitutional: She is oriented to person, place, and time. She appears distressed.  HENT:  Head: Normocephalic and atraumatic.  Eyes: Pupils are equal, round, and reactive to light.  Neck: Normal range of motion. Neck supple.  Pulmonary/Chest: She is in respiratory  distress. She has rales.  Abdominal: Soft. Bowel sounds are normal. She exhibits no distension.  Musculoskeletal: She exhibits edema.  Neurological: She is alert and oriented to person, place, and time.  Skin: Skin is warm.  Psychiatric: She has a normal mood and affect.   Vitals:   03/14/18 1437  Resp: 16  Weight: 185 lb (83.9 kg)  Height: 5\' 6"  (1.676 m)   BP 98/62 (BP Location: Left Arm, Cuff Size: Large)   Pulse 99   Resp 16   Ht 5\' 6"  (1.676 m)   Wt 185 lb (83.9 kg)   SpO2 92%   BMI 29.86 kg/m     Physical Examination:   GENERAL:+ fatigue increased Resp effort and WOB HEAD: Normocephalic, atraumatic.  EYES: Pupils equal, round, reactive to light. Extraocular muscles intact. No scleral icterus.  MOUTH: Moist mucosal membrane. Dentition intact. No abscess noted.  EAR, NOSE, THROAT: Clear without exudates. No external lesions.  NECK: Supple. No thyromegaly. No nodules. No JVD.  PULMONARY: CTA B/L +crackles CARDIOVASCULAR: S1 and S2. Regular rate and rhythm. No murmurs, rubs, or gallops. No edema. Pedal pulses 2+ bilaterally.  GASTROINTESTINAL: Soft, nontender, nondistended. No masses. Positive bowel sounds. No hepatosplenomegaly.  MUSCULOSKELETAL: No swelling, clubbing, or edema. Range of motion full in all extremities.  NEUROLOGIC: Cranial nerves II through XII are intact. No gross focal neurological deficits. Sensation intact. Reflexes intact.  SKIN: No ulceration, lesions, rashes, or cyanosis. Skin warm and dry. Turgor intact.  PSYCHIATRIC: Mood, affect within normal limits. The patient is  awake, alert and oriented x 3. Insight, judgment intact.  ALL OTHER ROS ARE NEGATIVE            Assessment & Plan:    84 year old pleasant white female seen today for progressive worsening shortness of breath increased work of breathing, In the setting of bilateral lower lobe crackles with lower extremity swelling despite being on prednisone taper and antibiotics for UTI most  likely diagnosis is a combination of systolic or diastolic congestive heart failure and cardiac dysfunction.  I recommend patient going to the ER for further evaluation for chest x-ray, lab work  Most likely will need Lasix therapy however will need a Chem-7 to verify the kidney function Patient may also need oxygen therapy as her ambulate pulse ox did drop into the 90s in our office  Daughter is comfortable taking patient to the ER for further evaluation and assessment     Patient/Family are satisfied with Plan of action and management. All questions answered  Lucie Leather, M.D.  [facility]  Medical Director [facility] Medical Director [facility] Cardio-Pulmonary Department

## 2018-03-14 NOTE — Discharge Instructions (Addendum)
Use your Ventolin inhaler every 4-6 hours for the next several days, and the Advair inhaler as prescribed.  Make an appointment to follow-up with your primary doctor and pulmonologist within the next 1 to 2 weeks.  Return to the ER immediately for new, worsening, persistent severe shortness of breath, low oxygen level, chest pain, weakness, leg swelling, or any other new or worsening symptoms that concern you.

## 2018-03-14 NOTE — ED Notes (Signed)
2nd Trop sent to lab 

## 2018-03-16 ENCOUNTER — Telehealth: Payer: Self-pay

## 2018-03-16 ENCOUNTER — Ambulatory Visit (INDEPENDENT_AMBULATORY_CARE_PROVIDER_SITE_OTHER): Payer: Medicare Other

## 2018-03-16 DIAGNOSIS — N183 Chronic kidney disease, stage 3 unspecified: Secondary | ICD-10-CM

## 2018-03-16 DIAGNOSIS — E538 Deficiency of other specified B group vitamins: Secondary | ICD-10-CM | POA: Diagnosis not present

## 2018-03-16 MED ORDER — CYANOCOBALAMIN 1000 MCG/ML IJ SOLN
1000.0000 ug | Freq: Once | INTRAMUSCULAR | Status: AC
  Administered 2018-03-16: 1000 ug via INTRAMUSCULAR

## 2018-03-16 NOTE — Telephone Encounter (Signed)
Sent to PCP my need to culture   Yellow Milky  Glu: negative Bil: Negative Ket: neg SG:1.015Blood: 1+ 25 Ery/ul PH:6.0 Pro: +- 15 Mg/dL WUJ:8.1 mg/dL Nit: negative Leu: 3+ 191 leu/uL

## 2018-03-16 NOTE — Progress Notes (Signed)
Pt was seen today for B-12 injection in RD pt tolerated well.

## 2018-03-16 NOTE — Telephone Encounter (Signed)
Lab is aware to run urine microalbumin

## 2018-03-16 NOTE — Telephone Encounter (Signed)
I am confused.  It appears she was to give urine for microalbumin/cr ratio.  Is she having problems.  If no, do not need to run urinalysis and culture just microalbumin/cr as ordered.

## 2018-03-16 NOTE — Telephone Encounter (Signed)
Spoke with patients daughter and patient. Pt completed abx about a week ago for UTI but denies any symptoms at this time. She says she was just bringing back the urine that Dr. Lorin Picket wanted to run the microalbumin on. Advised patient that I would give her a call back if anything further was needed.

## 2018-03-16 NOTE — Addendum Note (Signed)
Addended by: Warden Fillers on: 03/16/2018 03:27 PM   Modules accepted: Orders

## 2018-03-16 NOTE — Telephone Encounter (Signed)
Pt was seen today for NV and dropped off a urine. Pt stated that she was told to do so. Urine placed in the lab labeled.   Looks like their are orders to test urine.  Sent PCP as an FYI urine has been ran results are in.

## 2018-03-16 NOTE — Telephone Encounter (Signed)
Ok.  Just make sure lab knows we need urine for microablumin/cr that was ordered.

## 2018-03-17 LAB — MICROALBUMIN / CREATININE URINE RATIO
Creatinine,U: 48.2 mg/dL
MICROALB UR: 1.6 mg/dL (ref 0.0–1.9)
Microalb Creat Ratio: 3.2 mg/g (ref 0.0–30.0)

## 2018-03-20 ENCOUNTER — Telehealth: Payer: Self-pay | Admitting: Radiology

## 2018-03-20 ENCOUNTER — Encounter: Payer: Self-pay | Admitting: Internal Medicine

## 2018-03-20 NOTE — Telephone Encounter (Signed)
Pt coming in for labs tomorrow, please place future orders. Thank you.  

## 2018-03-20 NOTE — Telephone Encounter (Signed)
Patients labs that were drawn on 10/21 were drawn at 2:30 pm and patient was not fasting. Daughter is wondering if we still need to cancel appt for tomorrow.

## 2018-03-20 NOTE — Telephone Encounter (Signed)
Left detailed message for patient's daughter.

## 2018-03-20 NOTE — Telephone Encounter (Signed)
I would hold on rechecking her labs this soon.  I do want her to keep the appt for f/u cbc on 03/30/18.  Thanks

## 2018-03-20 NOTE — Telephone Encounter (Signed)
We did cholesterol labs, etc recently.  I do not see that she needs to come in tomorrow for labs.  She is scheduled for f/u cbc on 03/30/18.

## 2018-03-21 ENCOUNTER — Other Ambulatory Visit: Payer: Medicare Other

## 2018-03-25 IMAGING — DX DG WRIST 2V*R*
2 series · 2 of 2 positions shown · non-contrast
Comparison: 02/23/2017

CLINICAL DATA: Status post ORIF of right radius fracture.

EXAM:
RIGHT WRIST - 2 VIEW

[wrist ap]
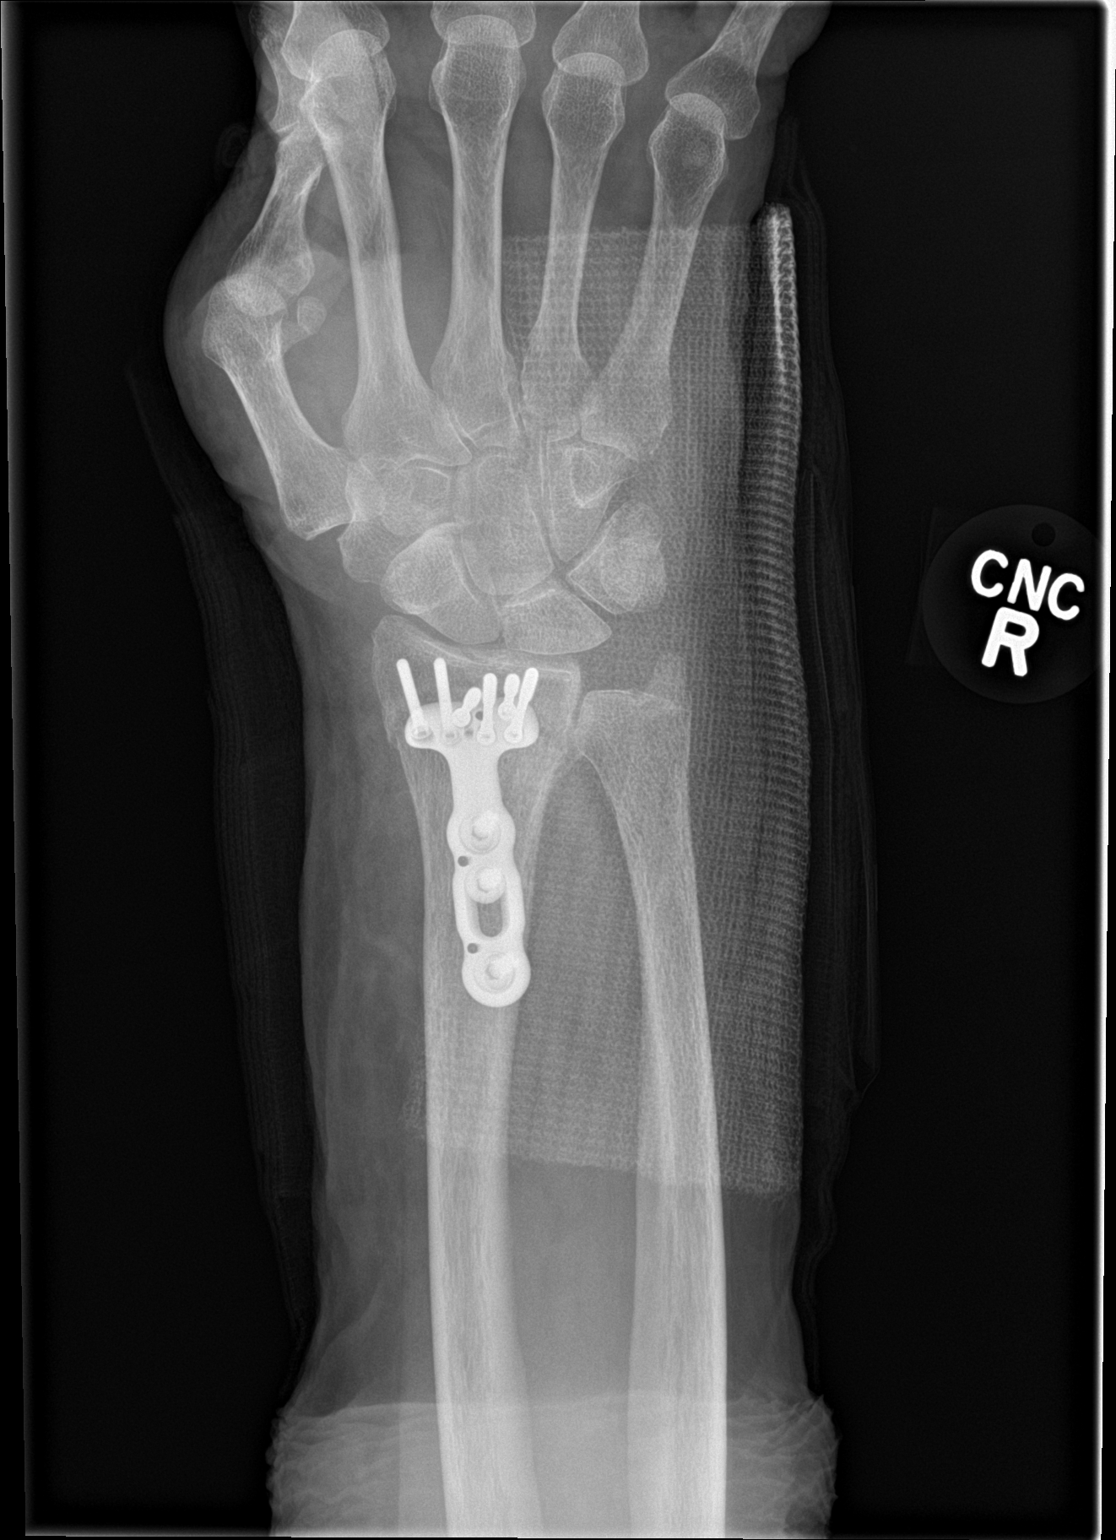

[wrist lat]
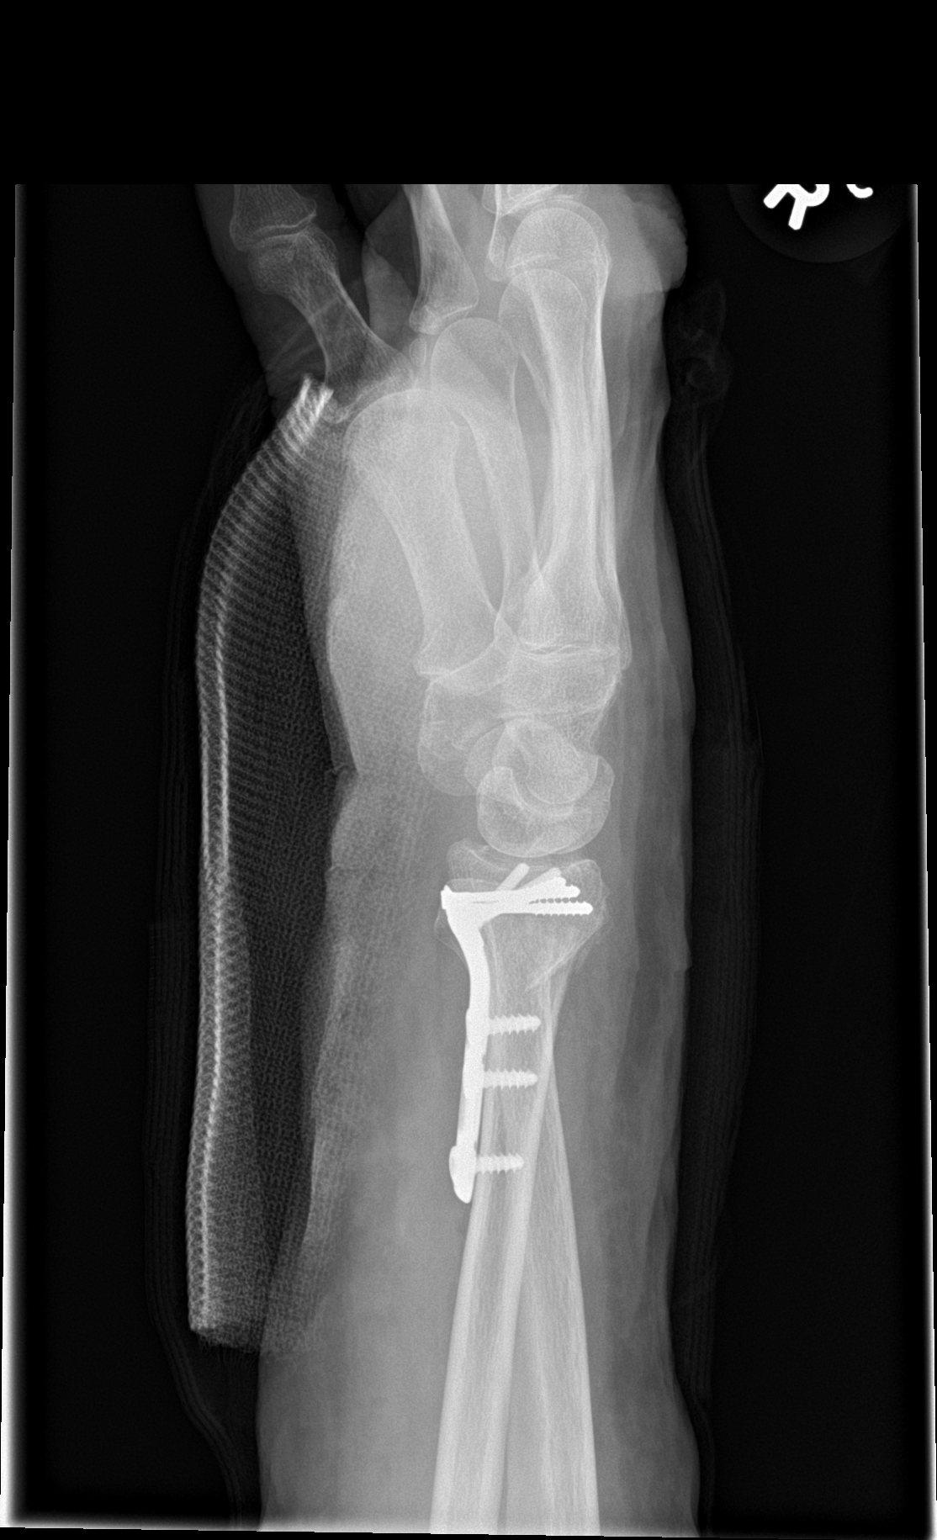

[2 of 2 positions shown; findings below may reference images not displayed]

FINDINGS: There has been interval volar plate and screw fixation of the
previously described distal radius fracture. Fracture impaction is
again noted with decreased dorsal angulation. The fracture is again
noted to extend into the radiocarpal articular surface. Overlying
splint material obscures the suspected nondisplaced ulnar styloid
fracture described on the prior study. There is soft tissue swelling
about the wrist. There is no dislocation.
IMPRESSION: Interval ORIF of distal radius fracture as above.

## 2018-03-26 ENCOUNTER — Other Ambulatory Visit: Payer: Self-pay | Admitting: Internal Medicine

## 2018-03-29 ENCOUNTER — Telehealth: Payer: Self-pay | Admitting: Radiology

## 2018-03-29 DIAGNOSIS — D72829 Elevated white blood cell count, unspecified: Secondary | ICD-10-CM

## 2018-03-29 NOTE — Telephone Encounter (Signed)
Pt com in for labs tomorrow, please place future orders. Thank you.

## 2018-03-29 NOTE — Telephone Encounter (Signed)
Order placed for f/u cbc.   

## 2018-03-30 ENCOUNTER — Other Ambulatory Visit (INDEPENDENT_AMBULATORY_CARE_PROVIDER_SITE_OTHER): Payer: Medicare Other

## 2018-03-30 DIAGNOSIS — D72829 Elevated white blood cell count, unspecified: Secondary | ICD-10-CM | POA: Diagnosis not present

## 2018-03-30 LAB — CBC WITH DIFFERENTIAL/PLATELET
BASOS ABS: 0 10*3/uL (ref 0.0–0.1)
Basophils Relative: 0.4 % (ref 0.0–3.0)
EOS ABS: 0.3 10*3/uL (ref 0.0–0.7)
Eosinophils Relative: 2.6 % (ref 0.0–5.0)
HEMATOCRIT: 40.6 % (ref 36.0–46.0)
HEMOGLOBIN: 12.9 g/dL (ref 12.0–15.0)
LYMPHS PCT: 28.6 % (ref 12.0–46.0)
Lymphs Abs: 3.3 10*3/uL (ref 0.7–4.0)
MCHC: 31.9 g/dL (ref 30.0–36.0)
MCV: 95.1 fl (ref 78.0–100.0)
MONOS PCT: 8.2 % (ref 3.0–12.0)
Monocytes Absolute: 0.9 10*3/uL (ref 0.1–1.0)
Neutro Abs: 6.9 10*3/uL (ref 1.4–7.7)
Neutrophils Relative %: 60.2 % (ref 43.0–77.0)
Platelets: 281 10*3/uL (ref 150.0–400.0)
RBC: 4.27 Mil/uL (ref 3.87–5.11)
RDW: 15.7 % — ABNORMAL HIGH (ref 11.5–15.5)
WBC: 11.5 10*3/uL — AB (ref 4.0–10.5)

## 2018-03-31 ENCOUNTER — Encounter: Payer: Self-pay | Admitting: Internal Medicine

## 2018-04-09 ENCOUNTER — Other Ambulatory Visit: Payer: Self-pay | Admitting: Internal Medicine

## 2018-04-17 ENCOUNTER — Ambulatory Visit (INDEPENDENT_AMBULATORY_CARE_PROVIDER_SITE_OTHER): Payer: Medicare Other | Admitting: Internal Medicine

## 2018-04-17 VITALS — BP 128/72 | HR 97 | Temp 97.5°F | Resp 18 | Wt 188.0 lb

## 2018-04-17 DIAGNOSIS — E538 Deficiency of other specified B group vitamins: Secondary | ICD-10-CM | POA: Diagnosis not present

## 2018-04-17 DIAGNOSIS — Z23 Encounter for immunization: Secondary | ICD-10-CM

## 2018-04-17 DIAGNOSIS — N183 Chronic kidney disease, stage 3 unspecified: Secondary | ICD-10-CM

## 2018-04-17 DIAGNOSIS — E7800 Pure hypercholesterolemia, unspecified: Secondary | ICD-10-CM

## 2018-04-17 DIAGNOSIS — J449 Chronic obstructive pulmonary disease, unspecified: Secondary | ICD-10-CM

## 2018-04-17 DIAGNOSIS — K219 Gastro-esophageal reflux disease without esophagitis: Secondary | ICD-10-CM | POA: Diagnosis not present

## 2018-04-17 DIAGNOSIS — E559 Vitamin D deficiency, unspecified: Secondary | ICD-10-CM

## 2018-04-17 DIAGNOSIS — I10 Essential (primary) hypertension: Secondary | ICD-10-CM

## 2018-04-17 DIAGNOSIS — E1151 Type 2 diabetes mellitus with diabetic peripheral angiopathy without gangrene: Secondary | ICD-10-CM | POA: Diagnosis not present

## 2018-04-17 MED ORDER — CYANOCOBALAMIN 1000 MCG/ML IJ SOLN
1000.0000 ug | Freq: Once | INTRAMUSCULAR | Status: AC
  Administered 2018-04-17: 1000 ug via INTRAMUSCULAR

## 2018-04-17 NOTE — Progress Notes (Signed)
[name], female   DOB: [birth year], 84 y.o.   MRN: [id number]   Subjective:    [name], female    DOB: 1927/05/10, 84 y.o.   MRN: [id number]  HPI  Patient here for a scheduled follow up.  She is accompanied by her daughter.  History obtained from both of them.  Breathing overall is stable.  Daughter reports some days better than others, but overall stable.  Using her inhalers.  Getting around her house.  Has a caretaker.  Plans to start walking more with her.  No chest pain.  No acid reflux.  No abdominal pain.  Bowels moving.  Discussed diet and exercise.  Discussed following sugars.     Past Medical History:  Diagnosis Date  . Asthma   . Chronic bronchitis ([place])   . COPD (chronic obstructive pulmonary disease) (HCC)    uses inhalers  . Depression   . Diabetes mellitus ([place])   . GERD (gastroesophageal reflux disease)    hiatal hernia  . Gout   . Hematuria    followed by urology  . History of colon polyps   . History of frequent urinary tract infections   . Humeral fracture 9/09   comminuted impacted proximal  . Hypercholesterolemia   . Hypertension   . Hypothyroidism   . Peripheral vascular disease ([place])   . Sleep apnea    does not use cpap as it did not benefit her   Past Surgical History:  Procedure Laterality Date  . BLADDER SUSPENSION  12/08/99  . BREAST BIOPSY Right 1960's  . CARPAL TUNNEL RELEASE Right 03/17/2017   Procedure: CARPAL TUNNEL RELEASE;  Surgeon: Hessie Knows, MD;  Location: [facility] ORS;  Service: Orthopedics;  Laterality: Right;  . CHOLECYSTECTOMY  1994  . COLONOSCOPY W/ POLYPECTOMY    . EYE SURGERY Bilateral    cataract extractions  . KNEE SURGERY Right 2013   arthroscopy  . ORIF WRIST FRACTURE Right 03/17/2017   Procedure: OPEN REDUCTION INTERNAL FIXATION (ORIF) WRIST FRACTURE;  Surgeon: Hessie Knows, MD;  Location: [facility] ORS;  Service: Orthopedics;  Laterality: Right;   Family History  Problem Relation Age of Onset    . Hypertension Father   . CVA Father   . CAD Father   . Hypertension Mother   . Colitis Mother   . CAD Mother   . Breast cancer Neg Hx   . Colon cancer Neg Hx    Social History   Socioeconomic History  . Marital status: Widowed    Spouse name: Not on file  . Number of children: 2  . Years of education: Not on file  . Highest education level: Not on file  Occupational History  . Not on file  Social Needs  . Financial resource strain: Not on file  . Food insecurity:    Worry: Not on file    Inability: Not on file  . Transportation needs:    Medical: Not on file    Non-medical: Not on file  Tobacco Use  . Smoking status: Never Smoker  . Smokeless tobacco: Never Used  Substance and Sexual Activity  . Alcohol use: No    Alcohol/week: 0.0 standard drinks  . Drug use: No  . Sexual activity: Never  Lifestyle  . Physical activity:    Days per week: Not on file    Minutes per session: Not on file  . Stress: Not on file  Relationships  . Social connections:  Talks on phone: Not on file    Gets together: Not on file    Attends religious service: Not on file    Active member of club or organization: Not on file    Attends meetings of clubs or organizations: Not on file    Relationship status: Not on file  Other Topics Concern  . Not on file  Social History Narrative  . Not on file    Outpatient Encounter Medications as of 04/17/2018  Medication Sig  . ADVAIR DISKUS 500-50 MCG/DOSE AEPB Inhale 1 puff into the lungs 2 (two) times daily.  Marland Kitchen allopurinol (ZYLOPRIM) 100 MG tablet Take 1 tablet (100 mg total) by mouth daily.  . Calcium Carbonate-Vitamin D (CALCIUM 600+D) 600-400 MG-UNIT per tablet Take 1 tablet by mouth daily.   . cetirizine (ZYRTEC) 10 MG tablet Take 10 mg by mouth daily.  . Cholecalciferol (VITAMIN D-3) 1000 UNITS CAPS Take 1 capsule by mouth daily.  . Cranberry-Cholecalciferol 4200-500 MG-UNIT CAPS Take 1 capsule by mouth daily.   Marland Kitchen FLUoxetine  (PROZAC) 40 MG capsule Take 1 capsule (40 mg total) by mouth daily.  Marland Kitchen levothyroxine (SYNTHROID, LEVOTHROID) 50 MCG tablet Take 1 tablet (50 mcg total) by mouth daily before breakfast.  . methenamine (HIPREX) 1 g tablet Take 1 tablet (1 g total) by mouth daily.  . montelukast (SINGULAIR) 10 MG tablet Take 1 tablet (10 mg total) by mouth at bedtime.  Marland Kitchen nystatin (MYCOSTATIN/NYSTOP) powder Apply topically 2 (two) times daily.  Marland Kitchen nystatin cream (MYCOSTATIN) Apply 1 application 2 (two) times daily topically.  . pantoprazole (PROTONIX) 40 MG tablet Take 1 tablet (40 mg total) by mouth daily.  . simvastatin (ZOCOR) 20 MG tablet Take 1 tablet (20 mg total) by mouth daily.  Marland Kitchen tolterodine (DETROL) 2 MG tablet Take 1 tablet (2 mg total) by mouth 2 (two) times daily.  . VENTOLIN HFA 108 (90 Base) MCG/ACT inhaler Inhale 2 puffs into the lungs every 6 (six) hours as needed for wheezi ng or shortness of breath.  . [DISCONTINUED] cefdinir (OMNICEF) 300 MG capsule Take 1 capsule (300 mg total) by mouth 2 (two) times daily. (Patient not taking: Reported on 03/14/2018)  . [EXPIRED] cyanocobalamin ((VITAMIN B-12)) injection 1,000 mcg    No facility-administered encounter medications on file as of 04/17/2018.     Review of Systems  Constitutional: Negative for appetite change and unexpected weight change.  HENT: Negative for congestion and sinus pressure.   Respiratory: Negative for cough and chest tightness.        Breathing overall stable.  Still with cough and congestion - stable.    Cardiovascular: Negative for chest pain, palpitations and leg swelling.  Gastrointestinal: Negative for abdominal pain, diarrhea, nausea and vomiting.  Genitourinary: Negative for difficulty urinating and dysuria.  Musculoskeletal: Negative for joint swelling and myalgias.  Skin: Negative for color change and rash.  Neurological: Negative for dizziness, light-headedness and headaches.  Psychiatric/Behavioral: Negative for  agitation and dysphoric mood.       Objective:    Physical Exam  Constitutional: She appears well-developed and well-nourished. No distress.  HENT:  Nose: Nose normal.  Mouth/Throat: Oropharynx is clear and moist.  Neck: Neck supple. No thyromegaly present.  Cardiovascular: Normal rate and regular rhythm.  Pulmonary/Chest: Breath sounds normal. No respiratory distress.  Some increased congestion.  Clears with coughing.    Abdominal: Soft. Bowel sounds are normal. There is no tenderness.  Musculoskeletal: She exhibits no edema or tenderness.  Lymphadenopathy:  She has no cervical adenopathy.  Skin: No rash noted. No erythema.  Psychiatric: She has a normal mood and affect. Her behavior is normal.    BP 128/72 (BP Location: Left Arm, Patient Position: Sitting, Cuff Size: Normal)   Pulse 97   Temp (!) 97.5 F (36.4 C) (Oral)   Resp 18   Wt 188 lb (85.3 kg)   SpO2 94%   BMI 31.28 kg/m  Wt Readings from Last 3 Encounters:  04/17/18 188 lb (85.3 kg)  03/14/18 185 lb (83.9 kg)  03/14/18 185 lb (83.9 kg)     Lab Results  Component Value Date   WBC 11.5 (H) 03/30/2018   HGB 12.9 03/30/2018   HCT 40.6 03/30/2018   PLT 281.0 03/30/2018   GLUCOSE 278 (H) 03/14/2018   CHOL 137 03/13/2018   TRIG 186.0 (H) 03/13/2018   HDL 44.90 03/13/2018   LDLCALC 55 03/13/2018   ALT 16 03/13/2018   AST 14 03/13/2018   NA 134 (L) 03/14/2018   K 4.4 03/14/2018   CL 101 03/14/2018   CREATININE 1.27 (H) 03/14/2018   BUN 34 (H) 03/14/2018   CO2 24 03/14/2018   TSH 3.68 03/13/2018   HGBA1C 7.9 (H) 03/13/2018   MICROALBUR 1.6 03/16/2018    Dg Chest 2 View  Result Date: 03/14/2018 CLINICAL DATA:  Short of breath, weakness, low oxygen saturation EXAM: CHEST - 2 VIEW COMPARISON:  Chest x-ray of 01/27/2018 FINDINGS: No active infiltrate or effusion is seen. There is some peribronchial thickening which may indicate bronchitis. Also somewhat prominent interstitial markings are present at  the lung bases consistent with fibrotic change. Mediastinal and hilar contours are unremarkable and the heart is within normal limits in size. No acute bony abnormality is seen IMPRESSION: 1. No active lung disease. 2. Changes of bronchitis and probable pulmonary fibrosis at the lung bases. . Electronically Signed   By: Ivar Drape M.D.   On: 03/14/2018 15:40       Assessment & Plan:   Problem List Items Addressed This Visit    Chronic obstructive airway disease with asthma ([place])    Cough and congestion/breathing - stable.  Continue regular inhalers.  Follow.  Continue f/u with pulmonary.        CKD (chronic kidney disease) stage 3, GFR 30-59 ml/min (HCC)    Avoid antiinflammatories.  Follow metabolic panel.        Relevant Orders   Basic metabolic panel   Diabetes mellitus with peripheral vascular disease ([place])    Low carb diet and exercise.  Follow met b and a1c.        Relevant Orders   Hemoglobin A1c   GERD (gastroesophageal reflux disease)    Controlled on current regimen.  Follow.        Hypercholesterolemia    On simvastatin.  Low cholesterol diet and exercise.  Follow lipid panel and liver function tests.        Relevant Orders   Hepatic function panel   Lipid panel   Hypertension    Blood pressure under good control.  Continue same medication regimen.  Follow pressures.  Follow metabolic panel.        Relevant Orders   CBC with Differential/Platelet   Vitamin B12 deficiency - Primary    Continue B12 injections.        Relevant Medications   cyanocobalamin ((VITAMIN B-12)) injection 1,000 mcg (Completed)   Vitamin D deficiency    Follow vitamin D level.  Other Visit Diagnoses    Encounter for immunization       Relevant Orders   Flu vaccine HIGH DOSE PF (Completed)       Einar Pheasant, MD

## 2018-04-23 ENCOUNTER — Encounter: Payer: Self-pay | Admitting: Internal Medicine

## 2018-04-23 NOTE — Assessment & Plan Note (Signed)
Low carb diet and exercise.  Follow met b and a1c.   

## 2018-04-23 NOTE — Assessment & Plan Note (Signed)
Blood pressure under good control.  Continue same medication regimen.  Follow pressures.  Follow metabolic panel.   

## 2018-04-23 NOTE — Assessment & Plan Note (Signed)
On simvastatin.  Low cholesterol diet and exercise.  Follow lipid panel and liver function tests.   

## 2018-04-23 NOTE — Assessment & Plan Note (Signed)
Cough and congestion/breathing - stable.  Continue regular inhalers.  Follow.  Continue f/u with pulmonary.

## 2018-04-23 NOTE — Assessment & Plan Note (Signed)
Avoid antiinflammatories.  Follow metabolic panel.  

## 2018-04-23 NOTE — Assessment & Plan Note (Signed)
Follow vitamin D level.  

## 2018-04-23 NOTE — Assessment & Plan Note (Signed)
Controlled on current regimen.  Follow.  

## 2018-04-23 NOTE — Assessment & Plan Note (Signed)
Continue B12 injections.   

## 2018-04-28 ENCOUNTER — Other Ambulatory Visit: Payer: Self-pay | Admitting: Internal Medicine

## 2018-05-25 ENCOUNTER — Ambulatory Visit (INDEPENDENT_AMBULATORY_CARE_PROVIDER_SITE_OTHER): Payer: Medicare Other | Admitting: *Deleted

## 2018-05-25 DIAGNOSIS — E538 Deficiency of other specified B group vitamins: Secondary | ICD-10-CM

## 2018-05-25 MED ORDER — CYANOCOBALAMIN 1000 MCG/ML IJ SOLN
1000.0000 ug | Freq: Once | INTRAMUSCULAR | Status: AC
  Administered 2018-05-25: 1000 ug via INTRAMUSCULAR

## 2018-05-25 NOTE — Progress Notes (Signed)
Patient presented for B 12 injection to left deltoid, patient voiced no concerns nor showed any signs of distress during injection. 

## 2018-05-28 ENCOUNTER — Other Ambulatory Visit: Payer: Self-pay | Admitting: Internal Medicine

## 2018-06-22 ENCOUNTER — Other Ambulatory Visit: Payer: Self-pay | Admitting: Internal Medicine

## 2018-06-22 MED ORDER — FLUOXETINE HCL 40 MG PO CAPS: 40.0000 mg | ORAL_CAPSULE | Freq: Every day | ORAL | 0 refills | Status: DC

## 2018-06-22 MED ORDER — PANTOPRAZOLE SODIUM 40 MG PO TBEC: 40.0000 mg | DELAYED_RELEASE_TABLET | Freq: Every day | ORAL | 0 refills | Status: DC

## 2018-06-22 MED ORDER — METHENAMINE HIPPURATE 1 G PO TABS: ORAL_TABLET | ORAL | 0 refills | Status: DC

## 2018-06-22 MED ORDER — TOLTERODINE TARTRATE 2 MG PO TABS: 2.0000 mg | ORAL_TABLET | Freq: Two times a day (BID) | ORAL | 0 refills | Status: DC

## 2018-06-23 ENCOUNTER — Other Ambulatory Visit: Payer: Self-pay | Admitting: Internal Medicine

## 2018-06-27 ENCOUNTER — Ambulatory Visit (INDEPENDENT_AMBULATORY_CARE_PROVIDER_SITE_OTHER): Payer: Medicare Other | Admitting: Lab

## 2018-06-27 DIAGNOSIS — E538 Deficiency of other specified B group vitamins: Secondary | ICD-10-CM | POA: Diagnosis not present

## 2018-06-27 MED ORDER — CYANOCOBALAMIN 1000 MCG/ML IJ SOLN
1000.0000 ug | Freq: Once | INTRAMUSCULAR | Status: AC
  Administered 2018-06-27: 1000 ug via INTRAMUSCULAR

## 2018-06-27 NOTE — Progress Notes (Addendum)
Pt was here today for Vit B12 injection in right Deltoid. Pt tolerated well.  Reviewed.  Dr Lorin Picket

## 2018-06-28 ENCOUNTER — Encounter: Payer: Self-pay | Admitting: Internal Medicine

## 2018-06-29 ENCOUNTER — Other Ambulatory Visit: Payer: Self-pay | Admitting: Internal Medicine

## 2018-06-30 ENCOUNTER — Telehealth: Payer: Self-pay

## 2018-06-30 NOTE — Telephone Encounter (Signed)
Pt is needing to reschedule an appt from one of the days that Dr. Lorin Picket is out of office

## 2018-06-30 NOTE — Telephone Encounter (Signed)
The requested that I call her daughter to reschedule her appointment , because her daughter brings her to all of her appointments. I left a message to return my call .

## 2018-06-30 NOTE — Telephone Encounter (Signed)
Copied from CRM [id number]. Topic: Appointment Scheduling - Scheduling Inquiry for Clinic >> Jun 30, 2018 11:33 AM Windy Kalata, NT wrote: Reason for CRM: patient daughter is calling and is needing to reschedule 07/31/18 appointment per office. First available is in the middle of April. Patient needing to be seen before then. Please contact to schedule/

## 2018-07-01 ENCOUNTER — Other Ambulatory Visit: Payer: Self-pay | Admitting: Internal Medicine

## 2018-07-10 ENCOUNTER — Other Ambulatory Visit: Payer: Self-pay | Admitting: Internal Medicine

## 2018-07-10 NOTE — Telephone Encounter (Signed)
Can we please move pt's appt up.  She is one of the pt's that we had to move off of March 9th.

## 2018-07-10 NOTE — Telephone Encounter (Signed)
I left Diamond Newman a message to give the office a call to confirm the earlier appointment time of 3.17.20 @ 4:00.

## 2018-07-10 NOTE — Telephone Encounter (Signed)
Pt's daughter, Raynelle Fanning, called and rescheduled pt's appt from 07/31/2018.  First available was in June and she took that appointment but states that pt needs to be seen sooner than that.  I have her on the wait list but she wanted to speak with Dr. Marina Goodell nurse regarding this.

## 2018-07-20 ENCOUNTER — Other Ambulatory Visit: Payer: Self-pay | Admitting: Internal Medicine

## 2018-07-23 ENCOUNTER — Other Ambulatory Visit: Payer: Self-pay | Admitting: Internal Medicine

## 2018-07-26 ENCOUNTER — Ambulatory Visit (INDEPENDENT_AMBULATORY_CARE_PROVIDER_SITE_OTHER): Payer: Medicare Other

## 2018-07-26 DIAGNOSIS — E538 Deficiency of other specified B group vitamins: Secondary | ICD-10-CM

## 2018-07-26 MED ORDER — CYANOCOBALAMIN 1000 MCG/ML IJ SOLN
1000.0000 ug | Freq: Once | INTRAMUSCULAR | Status: AC
  Administered 2018-07-26: 1000 ug via INTRAMUSCULAR

## 2018-07-26 NOTE — Progress Notes (Addendum)
Patient came in for B-12 injection in left deltoid. Patient tolerated well.   Reviewed.  Dr Lorin Picket

## 2018-07-31 ENCOUNTER — Ambulatory Visit: Payer: Medicare Other | Admitting: Internal Medicine

## 2018-08-02 ENCOUNTER — Other Ambulatory Visit: Payer: Self-pay | Admitting: Internal Medicine

## 2018-08-02 MED ORDER — NYSTATIN 100000 UNIT/GM EX CREA: 1.0000 "application " | TOPICAL_CREAM | Freq: Two times a day (BID) | CUTANEOUS | 0 refills | Status: DC

## 2018-08-07 ENCOUNTER — Other Ambulatory Visit: Payer: Self-pay | Admitting: Internal Medicine

## 2018-08-08 ENCOUNTER — Ambulatory Visit (INDEPENDENT_AMBULATORY_CARE_PROVIDER_SITE_OTHER): Payer: Medicare Other | Admitting: Internal Medicine

## 2018-08-08 ENCOUNTER — Other Ambulatory Visit: Payer: Self-pay

## 2018-08-08 ENCOUNTER — Encounter

## 2018-08-08 DIAGNOSIS — Z87440 Personal history of urinary (tract) infections: Secondary | ICD-10-CM

## 2018-08-08 DIAGNOSIS — E7800 Pure hypercholesterolemia, unspecified: Secondary | ICD-10-CM

## 2018-08-08 DIAGNOSIS — J449 Chronic obstructive pulmonary disease, unspecified: Secondary | ICD-10-CM | POA: Diagnosis not present

## 2018-08-08 DIAGNOSIS — N183 Chronic kidney disease, stage 3 unspecified: Secondary | ICD-10-CM

## 2018-08-08 DIAGNOSIS — E559 Vitamin D deficiency, unspecified: Secondary | ICD-10-CM

## 2018-08-08 DIAGNOSIS — K219 Gastro-esophageal reflux disease without esophagitis: Secondary | ICD-10-CM

## 2018-08-08 DIAGNOSIS — G4730 Sleep apnea, unspecified: Secondary | ICD-10-CM

## 2018-08-08 DIAGNOSIS — I10 Essential (primary) hypertension: Secondary | ICD-10-CM | POA: Diagnosis not present

## 2018-08-08 DIAGNOSIS — E1151 Type 2 diabetes mellitus with diabetic peripheral angiopathy without gangrene: Secondary | ICD-10-CM

## 2018-08-08 MED ORDER — TOLTERODINE TARTRATE 2 MG PO TABS: 2.0000 mg | ORAL_TABLET | Freq: Two times a day (BID) | ORAL | 2 refills | Status: DC

## 2018-08-08 MED ORDER — NYSTATIN 100000 UNIT/GM EX CREA: 1.0000 "application " | TOPICAL_CREAM | Freq: Two times a day (BID) | CUTANEOUS | 1 refills | Status: DC

## 2018-08-08 MED ORDER — LEVOTHYROXINE SODIUM 50 MCG PO TABS: 50.0000 ug | ORAL_TABLET | Freq: Every day | ORAL | 1 refills | Status: DC

## 2018-08-08 MED ORDER — ALBUTEROL SULFATE HFA 108 (90 BASE) MCG/ACT IN AERS: INHALATION_SPRAY | RESPIRATORY_TRACT | 0 refills | Status: DC

## 2018-08-08 MED ORDER — FLUOXETINE HCL 40 MG PO CAPS: 40.0000 mg | ORAL_CAPSULE | Freq: Every day | ORAL | 2 refills | Status: DC

## 2018-08-08 MED ORDER — NYSTATIN 100000 UNIT/GM EX POWD: Freq: Two times a day (BID) | CUTANEOUS | 1 refills | Status: DC

## 2018-08-08 MED ORDER — PANTOPRAZOLE SODIUM 40 MG PO TBEC: 40.0000 mg | DELAYED_RELEASE_TABLET | Freq: Every day | ORAL | 2 refills | Status: DC

## 2018-08-08 NOTE — Progress Notes (Signed)
[name], female   DOB: Mar 20, 1927, 85 y.o.   MRN: [id number]   Subjective:    [name], female    DOB: 07-11-1926, 85 y.o.   MRN: [id number]  HPI  Patient here for a scheduled follow up. She is accompanied by her daughter.  History obtained from both of them.  Overall things appear to be stable.  States has good and bad days, but overall breathing stable.  Has caretakers who try to walk her, etc.  States she is still getting up and around.  Daughter reports that some days she is not as cooperative as others - regarding doing her exercise.  Eating.  No vomiting.  No nausea.  Still with some stable cough.  Bowels stable.  Taking a stool softener seems to keep them more regular.  Did not tolerate miralax.  On increased zoloft.  Doing better on this dose.  No significant depression.     Past Medical History:  Diagnosis Date  . Asthma   . Chronic bronchitis ([place])   . COPD (chronic obstructive pulmonary disease) (HCC)    uses inhalers  . Depression   . Diabetes mellitus ([place])   . GERD (gastroesophageal reflux disease)    hiatal hernia  . Gout   . Hematuria    followed by urology  . History of colon polyps   . History of frequent urinary tract infections   . Humeral fracture 9/09   comminuted impacted proximal  . Hypercholesterolemia   . Hypertension   . Hypothyroidism   . Peripheral vascular disease ([place])   . Sleep apnea    does not use cpap as it did not benefit her   Past Surgical History:  Procedure Laterality Date  . BLADDER SUSPENSION  12/08/99  . BREAST BIOPSY Right 1960's  . CARPAL TUNNEL RELEASE Right 03/17/2017   Procedure: CARPAL TUNNEL RELEASE;  Surgeon: Hessie Knows, MD;  Location: [facility] ORS;  Service: Orthopedics;  Laterality: Right;  . CHOLECYSTECTOMY  1994  . COLONOSCOPY W/ POLYPECTOMY    . EYE SURGERY Bilateral    cataract extractions  . KNEE SURGERY Right 2013   arthroscopy  . ORIF WRIST FRACTURE Right 03/17/2017   Procedure:  OPEN REDUCTION INTERNAL FIXATION (ORIF) WRIST FRACTURE;  Surgeon: Hessie Knows, MD;  Location: [facility] ORS;  Service: Orthopedics;  Laterality: Right;   Family History  Problem Relation Age of Onset  . Hypertension Father   . CVA Father   . CAD Father   . Hypertension Mother   . Colitis Mother   . CAD Mother   . Breast cancer Neg Hx   . Colon cancer Neg Hx    Social History   Socioeconomic History  . Marital status: Widowed    Spouse name: Not on file  . Number of children: 2  . Years of education: Not on file  . Highest education level: Not on file  Occupational History  . Not on file  Social Needs  . Financial resource strain: Not on file  . Food insecurity:    Worry: Not on file    Inability: Not on file  . Transportation needs:    Medical: Not on file    Non-medical: Not on file  Tobacco Use  . Smoking status: Never Smoker  . Smokeless tobacco: Never Used  Substance and Sexual Activity  . Alcohol use: No    Alcohol/week: 0.0 standard drinks  . Drug use: No  . Sexual activity: Never  Lifestyle  . Physical activity:    Days per week: Not on file    Minutes per session: Not on file  . Stress: Not on file  Relationships  . Social connections:    Talks on phone: Not on file    Gets together: Not on file    Attends religious service: Not on file    Active member of club or organization: Not on file    Attends meetings of clubs or organizations: Not on file    Relationship status: Not on file  Other Topics Concern  . Not on file  Social History Narrative  . Not on file    Outpatient Encounter Medications as of 08/08/2018  Medication Sig  . ADVAIR DISKUS 500-50 MCG/DOSE AEPB Inhale 1 puff into the lungs 2 (two) times daily.  Marland Kitchen albuterol (VENTOLIN HFA) 108 (90 Base) MCG/ACT inhaler Inhale 2 puffs into the lungs every 6 (six) hours as needed for wheezi ng or shortness of breath.  . allopurinol (ZYLOPRIM) 100 MG tablet Take 1 tablet (100 mg total) by mouth daily.   . Calcium Carbonate-Vitamin D (CALCIUM 600+D) 600-400 MG-UNIT per tablet Take 1 tablet by mouth daily.   . cetirizine (ZYRTEC) 10 MG tablet Take 10 mg by mouth daily.  . Cholecalciferol (VITAMIN D-3) 1000 UNITS CAPS Take 1 capsule by mouth daily.  . Cranberry-Cholecalciferol 4200-500 MG-UNIT CAPS Take 1 capsule by mouth daily.   Marland Kitchen FLUoxetine (PROZAC) 40 MG capsule Take 1 capsule (40 mg total) by mouth daily.  Marland Kitchen levothyroxine (SYNTHROID, LEVOTHROID) 50 MCG tablet Take 1 tablet (50 mcg total) by mouth daily before breakfast.  . methenamine (HIPREX) 1 g tablet Take 1 tablet (1 g total) by mouth daily.  . montelukast (SINGULAIR) 10 MG tablet Take 1 tablet (10 mg total) by mouth at bedtime.  Marland Kitchen nystatin (MYCOSTATIN/NYSTOP) powder Apply topically 2 (two) times daily.  Marland Kitchen nystatin cream (MYCOSTATIN) Apply 1 application topically 2 (two) times daily.  . pantoprazole (PROTONIX) 40 MG tablet Take 1 tablet (40 mg total) by mouth daily.  . simvastatin (ZOCOR) 20 MG tablet Take 1 tablet (20 mg total) by mouth daily.  Marland Kitchen tolterodine (DETROL) 2 MG tablet Take 1 tablet (2 mg total) by mouth 2 (two) times daily.  . [DISCONTINUED] FLUoxetine (PROZAC) 40 MG capsule Take 1 capsule (40 mg total) by mouth daily.  . [DISCONTINUED] levothyroxine (SYNTHROID, LEVOTHROID) 50 MCG tablet Take 1 tablet (50 mcg total) by mouth daily before breakfast.  . [DISCONTINUED] nystatin (MYCOSTATIN/NYSTOP) powder Apply topically 2 (two) times daily.  . [DISCONTINUED] nystatin cream (MYCOSTATIN) Apply 1 application topically 2 (two) times daily.  . [DISCONTINUED] pantoprazole (PROTONIX) 40 MG tablet Take 1 tablet (40 mg total) by mouth daily.  . [DISCONTINUED] tolterodine (DETROL) 2 MG tablet Take 1 tablet (2 mg total) by mouth 2 (two) times daily.  . [DISCONTINUED] VENTOLIN HFA 108 (90 Base) MCG/ACT inhaler Inhale 2 puffs into the lungs every 6 (six) hours as needed for wheezi ng or shortness of breath.   No facility-administered  encounter medications on file as of 08/08/2018.     Review of Systems  Constitutional: Negative for appetite change and unexpected weight change.  HENT: Negative for congestion and sinus pressure.   Respiratory: Positive for cough. Negative for chest tightness.        Cough stable.  No increase.  Breathing overall stable.    Cardiovascular: Negative for chest pain, palpitations and leg swelling.  Gastrointestinal: Negative for abdominal pain, nausea and vomiting.  Bowels moving well.  Taking stool softener.   Genitourinary: Negative for difficulty urinating and dysuria.  Musculoskeletal: Negative for joint swelling and myalgias.  Skin: Negative for color change and rash.  Neurological: Negative for dizziness, light-headedness and headaches.  Psychiatric/Behavioral: Negative for agitation and dysphoric mood.       Objective:    Physical Exam Constitutional:      General: She is not in acute distress.    Appearance: Normal appearance.  HENT:     Nose: Nose normal. No congestion.     Mouth/Throat:     Pharynx: No oropharyngeal exudate or posterior oropharyngeal erythema.  Neck:     Musculoskeletal: Neck supple. No muscular tenderness.     Thyroid: No thyromegaly.  Cardiovascular:     Rate and Rhythm: Normal rate and regular rhythm.  Pulmonary:     Effort: No respiratory distress.     Breath sounds: Normal breath sounds. No wheezing.  Abdominal:     General: Bowel sounds are normal.     Palpations: Abdomen is soft.     Tenderness: There is no abdominal tenderness.  Musculoskeletal:        General: No swelling or tenderness.  Lymphadenopathy:     Cervical: No cervical adenopathy.  Skin:    Findings: No erythema or rash.  Neurological:     Mental Status: She is alert.  Psychiatric:        Mood and Affect: Mood normal.        Behavior: Behavior normal.     BP 136/74   Pulse 90   Temp 98.2 F (36.8 C) (Oral)   Resp 16   Wt 186 lb 3.2 oz (84.5 kg)   SpO2 95%    BMI 30.99 kg/m  Wt Readings from Last 3 Encounters:  08/08/18 186 lb 3.2 oz (84.5 kg)  04/17/18 188 lb (85.3 kg)  03/14/18 185 lb (83.9 kg)     Lab Results  Component Value Date   WBC 11.5 (H) 03/30/2018   HGB 12.9 03/30/2018   HCT 40.6 03/30/2018   PLT 281.0 03/30/2018   GLUCOSE 278 (H) 03/14/2018   CHOL 137 03/13/2018   TRIG 186.0 (H) 03/13/2018   HDL 44.90 03/13/2018   LDLCALC 55 03/13/2018   ALT 16 03/13/2018   AST 14 03/13/2018   NA 134 (L) 03/14/2018   K 4.4 03/14/2018   CL 101 03/14/2018   CREATININE 1.27 (H) 03/14/2018   BUN 34 (H) 03/14/2018   CO2 24 03/14/2018   TSH 3.68 03/13/2018   HGBA1C 7.9 (H) 03/13/2018   MICROALBUR 1.6 03/16/2018    Dg Chest 2 View  Result Date: 03/14/2018 CLINICAL DATA:  Short of breath, weakness, low oxygen saturation EXAM: CHEST - 2 VIEW COMPARISON:  Chest x-ray of 01/27/2018 FINDINGS: No active infiltrate or effusion is seen. There is some peribronchial thickening which may indicate bronchitis. Also somewhat prominent interstitial markings are present at the lung bases consistent with fibrotic change. Mediastinal and hilar contours are unremarkable and the heart is within normal limits in size. No acute bony abnormality is seen IMPRESSION: 1. No active lung disease. 2. Changes of bronchitis and probable pulmonary fibrosis at the lung bases. . Electronically Signed   By: Ivar Drape M.D.   On: 03/14/2018 15:40       Assessment & Plan:   Problem List Items Addressed This Visit    Chronic obstructive airway disease with asthma (HCC)    Cough, congestion and breathing stable.  Continue current  regimen.  Discussed importance of getting up and moving around, etc.  Continue f/u with pulmonary.        Relevant Medications   albuterol (VENTOLIN HFA) 108 (90 Base) MCG/ACT inhaler   CKD (chronic kidney disease) stage 3, GFR 30-59 ml/min (HCC)    Avoid antiinflammatories.  Follow metabolic panel.       Diabetes mellitus with peripheral  vascular disease (HCC)    Low carb diet and exercise.  Follow met b and a1c.        GERD (gastroesophageal reflux disease)    Appears to be controlled on current regimen.  Follow.       Relevant Medications   pantoprazole (PROTONIX) 40 MG tablet   History of frequent urinary tract infections    Has been evaluated by urology.  On hipprex - recommended by urology.  Has been doing well.  Follow.        Hypercholesterolemia    On simvastatin.  Low cholesterol diet and exercise.  Follow lipid panel and liver function tests.        Hypertension    Blood pressure under good control.  Continue same medication regimen.  Follow pressures.  Follow metabolic panel.        Sleep apnea    Did not tolerate cpap and refuses to retry.        Vitamin D deficiency    Follow vitamin D level.            Einar Pheasant, MD

## 2018-08-08 NOTE — Telephone Encounter (Signed)
Last OV 04/17/2018   Levo last refilled 01/16/2018 disp 30 with 6 refills   Nystatin last refilled 08/02/2018

## 2018-08-10 ENCOUNTER — Encounter: Payer: Self-pay | Admitting: Internal Medicine

## 2018-08-10 NOTE — Assessment & Plan Note (Signed)
Has been evaluated by urology.  On hipprex - recommended by urology.  Has been doing well.  Follow.

## 2018-08-10 NOTE — Assessment & Plan Note (Signed)
Cough, congestion and breathing stable.  Continue current regimen.  Discussed importance of getting up and moving around, etc.  Continue f/u with pulmonary.

## 2018-08-10 NOTE — Assessment & Plan Note (Signed)
Low carb diet and exercise.  Follow met b and a1c.   

## 2018-08-10 NOTE — Assessment & Plan Note (Signed)
On simvastatin.  Low cholesterol diet and exercise.  Follow lipid panel and liver function tests.   

## 2018-08-10 NOTE — Assessment & Plan Note (Signed)
Blood pressure under good control.  Continue same medication regimen.  Follow pressures.  Follow metabolic panel.   

## 2018-08-10 NOTE — Assessment & Plan Note (Signed)
Did not tolerate cpap and refuses to retry.

## 2018-08-10 NOTE — Assessment & Plan Note (Signed)
Follow vitamin D level.  

## 2018-08-10 NOTE — Assessment & Plan Note (Signed)
Appears to be controlled on current regimen.  Follow.   

## 2018-08-10 NOTE — Assessment & Plan Note (Signed)
Avoid antiinflammatories.  Follow metabolic panel.  

## 2018-08-29 ENCOUNTER — Ambulatory Visit (INDEPENDENT_AMBULATORY_CARE_PROVIDER_SITE_OTHER): Payer: Medicare Other

## 2018-08-29 ENCOUNTER — Other Ambulatory Visit: Payer: Self-pay

## 2018-08-29 DIAGNOSIS — E538 Deficiency of other specified B group vitamins: Secondary | ICD-10-CM

## 2018-08-29 MED ORDER — CYANOCOBALAMIN 1000 MCG/ML IJ SOLN
1000.0000 ug | Freq: Once | INTRAMUSCULAR | Status: AC
  Administered 2018-08-29: 16:00:00 1000 ug via INTRAMUSCULAR

## 2018-08-29 NOTE — Progress Notes (Addendum)
Diamond Newman presents today for injection per MD orders. B12 injection administered IM in right Upper Arm. Administration without incident. Patient tolerated well.  Nina,cma  Reviewed.  Dr Lorin Picket

## 2018-09-19 ENCOUNTER — Other Ambulatory Visit: Payer: Self-pay | Admitting: Internal Medicine

## 2018-10-09 ENCOUNTER — Other Ambulatory Visit: Payer: Self-pay | Admitting: Internal Medicine

## 2018-10-09 ENCOUNTER — Encounter: Payer: Self-pay | Admitting: Internal Medicine

## 2018-10-09 ENCOUNTER — Other Ambulatory Visit: Payer: Self-pay

## 2018-10-09 ENCOUNTER — Other Ambulatory Visit (INDEPENDENT_AMBULATORY_CARE_PROVIDER_SITE_OTHER): Payer: Medicare Other

## 2018-10-09 DIAGNOSIS — N183 Chronic kidney disease, stage 3 unspecified: Secondary | ICD-10-CM

## 2018-10-09 DIAGNOSIS — I10 Essential (primary) hypertension: Secondary | ICD-10-CM

## 2018-10-09 DIAGNOSIS — E7800 Pure hypercholesterolemia, unspecified: Secondary | ICD-10-CM

## 2018-10-09 DIAGNOSIS — E1151 Type 2 diabetes mellitus with diabetic peripheral angiopathy without gangrene: Secondary | ICD-10-CM | POA: Diagnosis not present

## 2018-10-09 LAB — HEPATIC FUNCTION PANEL
ALT: 17 U/L (ref 0–35)
AST: 21 U/L (ref 0–37)
Albumin: 3.8 g/dL (ref 3.5–5.2)
Alkaline Phosphatase: 68 U/L (ref 39–117)
Bilirubin, Direct: 0.1 mg/dL (ref 0.0–0.3)
Total Bilirubin: 0.5 mg/dL (ref 0.2–1.2)
Total Protein: 6.5 g/dL (ref 6.0–8.3)

## 2018-10-09 LAB — BASIC METABOLIC PANEL
BUN: 24 mg/dL — ABNORMAL HIGH (ref 6–23)
CO2: 28 mEq/L (ref 19–32)
Calcium: 9.7 mg/dL (ref 8.4–10.5)
Chloride: 105 mEq/L (ref 96–112)
Creatinine, Ser: 1.19 mg/dL (ref 0.40–1.20)
GFR: 42.62 mL/min — ABNORMAL LOW (ref >60.00)
Glucose, Bld: 137 mg/dL — ABNORMAL HIGH (ref 70–99)
Potassium: 4.9 mEq/L (ref 3.5–5.1)
Sodium: 141 mEq/L (ref 135–145)

## 2018-10-09 LAB — LIPID PANEL
Cholesterol: 160 mg/dL (ref 0–200)
HDL: 52 mg/dL (ref >39.00)
LDL Cholesterol: 88 mg/dL (ref 0–99)
NonHDL: 108.45
Total CHOL/HDL Ratio: 3
Triglycerides: 102 mg/dL (ref 0.0–149.0)
VLDL: 20.4 mg/dL (ref 0.0–40.0)

## 2018-10-09 LAB — CBC WITH DIFFERENTIAL/PLATELET
Basophils Absolute: 0 10*3/uL (ref 0.0–0.1)
Basophils Relative: 0.3 % (ref 0.0–3.0)
Eosinophils Absolute: 0.2 10*3/uL (ref 0.0–0.7)
Eosinophils Relative: 2.2 % (ref 0.0–5.0)
HCT: 40.1 % (ref 36.0–46.0)
Hemoglobin: 13 g/dL (ref 12.0–15.0)
Lymphocytes Relative: 23.6 % (ref 12.0–46.0)
Lymphs Abs: 2.2 10*3/uL (ref 0.7–4.0)
MCHC: 32.4 g/dL (ref 30.0–36.0)
MCV: 95.6 fl (ref 78.0–100.0)
Monocytes Absolute: 0.7 10*3/uL (ref 0.1–1.0)
Monocytes Relative: 7.4 % (ref 3.0–12.0)
Neutro Abs: 6.1 10*3/uL (ref 1.4–7.7)
Neutrophils Relative %: 66.5 % (ref 43.0–77.0)
Platelets: 273 10*3/uL (ref 150.0–400.0)
RBC: 4.2 Mil/uL (ref 3.87–5.11)
RDW: 15.5 % (ref 11.5–15.5)
WBC: 9.3 10*3/uL (ref 4.0–10.5)

## 2018-10-09 LAB — HEMOGLOBIN A1C: Hgb A1c MFr Bld: 7.1 % — ABNORMAL HIGH (ref 4.6–6.5)

## 2018-10-18 ENCOUNTER — Other Ambulatory Visit: Payer: Self-pay

## 2018-10-18 ENCOUNTER — Ambulatory Visit (INDEPENDENT_AMBULATORY_CARE_PROVIDER_SITE_OTHER): Payer: Medicare Other

## 2018-10-18 DIAGNOSIS — E538 Deficiency of other specified B group vitamins: Secondary | ICD-10-CM

## 2018-10-18 MED ORDER — CYANOCOBALAMIN 1000 MCG/ML IJ SOLN
1000.0000 ug | Freq: Once | INTRAMUSCULAR | Status: AC
  Administered 2018-10-18: 1000 ug via INTRAMUSCULAR

## 2018-10-18 NOTE — Progress Notes (Addendum)
Pt came in for B12 injection. Given in right deltoid. Tolerated well. No complaints or concerns at this time.   Reviewed.  Dr Lorin Picket

## 2018-10-26 ENCOUNTER — Ambulatory Visit: Payer: Medicare Other | Admitting: Internal Medicine

## 2018-11-09 ENCOUNTER — Other Ambulatory Visit: Payer: Self-pay | Admitting: Internal Medicine

## 2018-11-13 ENCOUNTER — Other Ambulatory Visit: Payer: Self-pay

## 2018-11-13 ENCOUNTER — Ambulatory Visit (INDEPENDENT_AMBULATORY_CARE_PROVIDER_SITE_OTHER): Payer: Medicare Other | Admitting: Internal Medicine

## 2018-11-13 ENCOUNTER — Encounter: Payer: Self-pay | Admitting: Internal Medicine

## 2018-11-13 DIAGNOSIS — J449 Chronic obstructive pulmonary disease, unspecified: Secondary | ICD-10-CM

## 2018-11-13 DIAGNOSIS — N183 Chronic kidney disease, stage 3 unspecified: Secondary | ICD-10-CM

## 2018-11-13 DIAGNOSIS — E7800 Pure hypercholesterolemia, unspecified: Secondary | ICD-10-CM

## 2018-11-13 DIAGNOSIS — K219 Gastro-esophageal reflux disease without esophagitis: Secondary | ICD-10-CM

## 2018-11-13 DIAGNOSIS — R05 Cough: Secondary | ICD-10-CM

## 2018-11-13 DIAGNOSIS — R059 Cough, unspecified: Secondary | ICD-10-CM

## 2018-11-13 DIAGNOSIS — E538 Deficiency of other specified B group vitamins: Secondary | ICD-10-CM

## 2018-11-13 DIAGNOSIS — E1151 Type 2 diabetes mellitus with diabetic peripheral angiopathy without gangrene: Secondary | ICD-10-CM | POA: Diagnosis not present

## 2018-11-13 DIAGNOSIS — I10 Essential (primary) hypertension: Secondary | ICD-10-CM

## 2018-11-13 NOTE — Progress Notes (Signed)
[name], female   DOB: February 15, 1927, 85 y.o.   MRN: [id number]   Virtual Visit via video Note  This visit type was conducted due to national recommendations for restrictions regarding the COVID-19 pandemic (e.g. social distancing).  This format is felt to be most appropriate for this patient at this time.  All issues noted in this document were discussed and addressed.  No physical exam was performed (except for noted visual exam findings with Video Visits).   I connected with Evelina Dun by a video enabled telemedicine application or telephone and verified that I am speaking with the correct person using two identifiers. Location patient: home Location provider: work Persons participating in the virtual visit: patient, provider and pts daughter [name]).      I discussed the limitations, risks, security and privacy concerns of performing an evaluation and management service by video and the availability of in person appointments. The patient expressed understanding and agreed to proceed.   Reason for visit: scheduled follow up.    HPI: Report she is doing relatively well.  Breathing overall is stable.  No increased cough or congestion currently.  Some days better than others.  On advair.  Eating well.  Has caretakers.  Discussed importance of getting up and staying active.  No chest pain.  No acid reflux.  No abdominal pain.  Last a1c 7.1.  On simvastatin.  Handling stress.     ROS: See pertinent positives and negatives per HPI.  Past Medical History:  Diagnosis Date  . Asthma   . Chronic bronchitis ([place])   . COPD (chronic obstructive pulmonary disease) (HCC)    uses inhalers  . Depression   . Diabetes mellitus ([place])   . GERD (gastroesophageal reflux disease)    hiatal hernia  . Gout   . Hematuria    followed by urology  . History of colon polyps   . History of frequent urinary tract infections   . Humeral fracture 9/09   comminuted impacted proximal  .  Hypercholesterolemia   . Hypertension   . Hypothyroidism   . Peripheral vascular disease ([place])   . Sleep apnea    does not use cpap as it did not benefit her    Past Surgical History:  Procedure Laterality Date  . BLADDER SUSPENSION  12/08/99  . BREAST BIOPSY Right 1960's  . CARPAL TUNNEL RELEASE Right 03/17/2017   Procedure: CARPAL TUNNEL RELEASE;  Surgeon: Hessie Knows, MD;  Location: [facility] ORS;  Service: Orthopedics;  Laterality: Right;  . CHOLECYSTECTOMY  1994  . COLONOSCOPY W/ POLYPECTOMY    . EYE SURGERY Bilateral    cataract extractions  . KNEE SURGERY Right 2013   arthroscopy  . ORIF WRIST FRACTURE Right 03/17/2017   Procedure: OPEN REDUCTION INTERNAL FIXATION (ORIF) WRIST FRACTURE;  Surgeon: Hessie Knows, MD;  Location: [facility] ORS;  Service: Orthopedics;  Laterality: Right;    Family History  Problem Relation Age of Onset  . Hypertension Father   . CVA Father   . CAD Father   . Hypertension Mother   . Colitis Mother   . CAD Mother   . Breast cancer Neg Hx   . Colon cancer Neg Hx     SOCIAL HX: reviewed.    Current Outpatient Medications:  .  ADVAIR DISKUS 500-50 MCG/DOSE AEPB, Inhale 1 puff into the lungs 2 (two) times daily., Disp: 60 each, Rfl: 0 .  albuterol (VENTOLIN HFA) 108 (90 Base) MCG/ACT inhaler, Inhale 2 puffs into the  lungs every 6 (six) hours as needed for wheezi ng or shortness of breath., Disp: 18 g, Rfl: 0 .  allopurinol (ZYLOPRIM) 100 MG tablet, Take 1 tablet (100 mg total) by mouth daily., Disp: 90 tablet, Rfl: 1 .  Calcium Carbonate-Vitamin D (CALCIUM 600+D) 600-400 MG-UNIT per tablet, Take 1 tablet by mouth daily. , Disp: , Rfl:  .  cetirizine (ZYRTEC) 10 MG tablet, Take 10 mg by mouth daily., Disp: , Rfl:  .  Cholecalciferol (VITAMIN D-3) 1000 UNITS CAPS, Take 1 capsule by mouth daily., Disp: , Rfl:  .  Cranberry-Cholecalciferol 4200-500 MG-UNIT CAPS, Take 1 capsule by mouth daily. , Disp: , Rfl:  .  FLUoxetine (PROZAC) 40 MG capsule, Take 1  capsule (40 mg total) by mouth daily., Disp: 30 capsule, Rfl: 2 .  levothyroxine (SYNTHROID, LEVOTHROID) 50 MCG tablet, Take 1 tablet (50 mcg total) by mouth daily before breakfast., Disp: 30 tablet, Rfl: 1 .  methenamine (HIPREX) 1 g tablet, Take 1 tablet (1 g total) by mouth daily., Disp: 30 tablet, Rfl: 0 .  montelukast (SINGULAIR) 10 MG tablet, Take 1 tablet (10 mg total) by mouth at bedtime., Disp: 30 tablet, Rfl: 0 .  nystatin (MYCOSTATIN/NYSTOP) powder, Apply topically 2 (two) times daily., Disp: 30 g, Rfl: 1 .  nystatin cream (MYCOSTATIN), Apply 1 application topically 2 (two) times daily., Disp: 30 g, Rfl: 1 .  pantoprazole (PROTONIX) 40 MG tablet, Take 1 tablet (40 mg total) by mouth daily., Disp: 30 tablet, Rfl: 2 .  simvastatin (ZOCOR) 20 MG tablet, Take 1 tablet (20 mg total) by mouth daily., Disp: 90 tablet, Rfl: 3 .  tolterodine (DETROL) 2 MG tablet, Take 1 tablet (2 mg total) by mouth 2 (two) times daily., Disp: 60 tablet, Rfl: 2  EXAM:  GENERAL: alert, oriented, appears well and in no acute distress  HEENT: atraumatic, conjunttiva clear, no obvious abnormalities on inspection of external nose and ears  NECK: normal movements of the head and neck  LUNGS: on inspection no signs of respiratory distress, breathing rate appears normal, no obvious gross SOB, gasping or wheezing  CV: no obvious cyanosis  PSYCH/NEURO: pleasant and cooperative, no obvious depression or anxiety, speech and thought processing grossly intact  ASSESSMENT AND PLAN:  Discussed the following assessment and plan:  Chronic obstructive airway disease with asthma Breathing is overall stable.  No increased cough or congestion now.  Continue inhalers.    CKD (chronic kidney disease) stage 3, GFR 30-59 ml/min Avoid antiinflammatories.  Follow metabolic panel.   Cough Stable.  Continue current inhalers.    Diabetes mellitus with peripheral vascular disease (HCC) Low carb diet and exercise.  Follow met  b and a1c.   GERD (gastroesophageal reflux disease) Controlled on protonix.    Hypercholesterolemia On simvastatin.  Low cholesterol diet and exercise.  Follow lipid panel and liver function tests.    Hypertension Blood pressure has been doing well.  Follow pressures.  Follow metabolic panel.   Vitamin B12 deficiency Schedule f/u B12 check.     I discussed the assessment and treatment plan with the patient. The patient was provided an opportunity to ask questions and all were answered. The patient agreed with the plan and demonstrated an understanding of the instructions.   The patient was advised to call back or seek an in-person evaluation if the symptoms worsen or if the condition fails to improve as anticipated.    Einar Pheasant, MD

## 2018-11-18 ENCOUNTER — Encounter: Payer: Self-pay | Admitting: Internal Medicine

## 2018-11-18 ENCOUNTER — Other Ambulatory Visit: Payer: Self-pay | Admitting: Internal Medicine

## 2018-11-18 NOTE — Assessment & Plan Note (Signed)
Low carb diet and exercise.  Follow met b and a1c.  

## 2018-11-18 NOTE — Assessment & Plan Note (Signed)
Schedule f/u B12 check.

## 2018-11-18 NOTE — Assessment & Plan Note (Signed)
Breathing is overall stable.  No increased cough or congestion now.  Continue inhalers.

## 2018-11-18 NOTE — Assessment & Plan Note (Signed)
Stable  Continue current inhalers

## 2018-11-18 NOTE — Assessment & Plan Note (Signed)
Controlled on protonix.   

## 2018-11-18 NOTE — Assessment & Plan Note (Signed)
On simvastatin.  Low cholesterol diet and exercise.  Follow lipid panel and liver function tests.   

## 2018-11-18 NOTE — Assessment & Plan Note (Signed)
Blood pressure has been doing well.  Follow pressures.  Follow metabolic panel.   

## 2018-11-18 NOTE — Assessment & Plan Note (Signed)
Avoid antiinflammatories.  Follow metabolic panel.  

## 2018-11-19 ENCOUNTER — Other Ambulatory Visit: Payer: Self-pay | Admitting: Internal Medicine

## 2018-11-20 ENCOUNTER — Other Ambulatory Visit: Payer: Self-pay | Admitting: Internal Medicine

## 2018-11-20 ENCOUNTER — Other Ambulatory Visit: Payer: Self-pay

## 2018-11-20 MED ORDER — SIMVASTATIN 20 MG PO TABS: ORAL_TABLET | ORAL | 0 refills | Status: DC

## 2018-11-20 MED ORDER — TOLTERODINE TARTRATE 2 MG PO TABS: 2.0000 mg | ORAL_TABLET | Freq: Two times a day (BID) | ORAL | 0 refills | Status: DC

## 2018-11-20 MED ORDER — FLUTICASONE-SALMETEROL 500-50 MCG/DOSE IN AEPB: INHALATION_SPRAY | RESPIRATORY_TRACT | 0 refills | Status: DC

## 2018-11-21 ENCOUNTER — Ambulatory Visit: Payer: Medicare Other

## 2018-11-28 ENCOUNTER — Other Ambulatory Visit: Payer: Self-pay

## 2018-11-28 ENCOUNTER — Ambulatory Visit (INDEPENDENT_AMBULATORY_CARE_PROVIDER_SITE_OTHER): Payer: Medicare Other

## 2018-11-28 ENCOUNTER — Telehealth: Payer: Self-pay

## 2018-11-28 DIAGNOSIS — E538 Deficiency of other specified B group vitamins: Secondary | ICD-10-CM

## 2018-11-28 MED ORDER — CYANOCOBALAMIN 1000 MCG/ML IJ SOLN
1000.0000 ug | Freq: Once | INTRAMUSCULAR | Status: AC
  Administered 2018-11-28: 1000 ug via INTRAMUSCULAR

## 2018-11-28 NOTE — Telephone Encounter (Signed)
Patient's daughter, [name] brought pt in today for B12 injection and asked if Dr. Nicki Reaper could call B12 injections into pharmacy for pt to have caregiver give injections at home.  Pt's daughter said that it would be easier to give pt injections at home than bringing her out especially now that patient is in a wheelchair.  Informed daughter that request will be sent to Dr. Nicki Reaper.

## 2018-11-28 NOTE — Progress Notes (Addendum)
Patient presented today for B12 injection.  Administered IM in left deltoid.  Administered without incident.  Patient tolerated well with no signs of distress.  Reviewed.  Dr Scott  

## 2018-11-29 NOTE — Telephone Encounter (Signed)
Are you okay with sending injections into pharmacy for care giver to administer?

## 2018-11-29 NOTE — Telephone Encounter (Signed)
Was Diamond Newman instructed on how to give injections or does she know how to give B12 injections.  If so, ok to send in rx

## 2018-12-01 NOTE — Telephone Encounter (Signed)
Left message for [facility]

## 2018-12-03 ENCOUNTER — Other Ambulatory Visit: Payer: Self-pay | Admitting: Internal Medicine

## 2018-12-04 MED ORDER — MONTELUKAST SODIUM 10 MG PO TABS: 10.0000 mg | ORAL_TABLET | Freq: Every day | ORAL | 1 refills | Status: DC

## 2018-12-11 DIAGNOSIS — H353131 Nonexudative age-related macular degeneration, bilateral, early dry stage: Secondary | ICD-10-CM | POA: Diagnosis not present

## 2018-12-18 ENCOUNTER — Other Ambulatory Visit: Payer: Self-pay | Admitting: Internal Medicine

## 2019-01-02 ENCOUNTER — Ambulatory Visit: Payer: Medicare Other

## 2019-01-22 ENCOUNTER — Other Ambulatory Visit: Payer: Self-pay | Admitting: Internal Medicine

## 2019-01-23 NOTE — Telephone Encounter (Signed)
rx okd for hiprex #30 with one refill.

## 2019-01-24 ENCOUNTER — Telehealth: Payer: Self-pay | Admitting: *Deleted

## 2019-01-24 NOTE — Telephone Encounter (Signed)
Copied from [place] [id number]. Topic: General - Inquiry >> Jan 24, 2019  3:58 PM Richardo Priest, NT wrote: Reason for CRM: Patient's daughter called in stating she would like to add the b-12 injection and flu shot to mothers appointment. Please advise.

## 2019-01-25 NOTE — Telephone Encounter (Signed)
Noted on schedule °

## 2019-02-04 IMAGING — DX DG CHEST 2V
2 series · 2 of 2 positions shown · non-contrast
Comparison: 07/06/2016

CLINICAL DATA: Cough and congestion

EXAM:
CHEST - 2 VIEW

[chest pa]
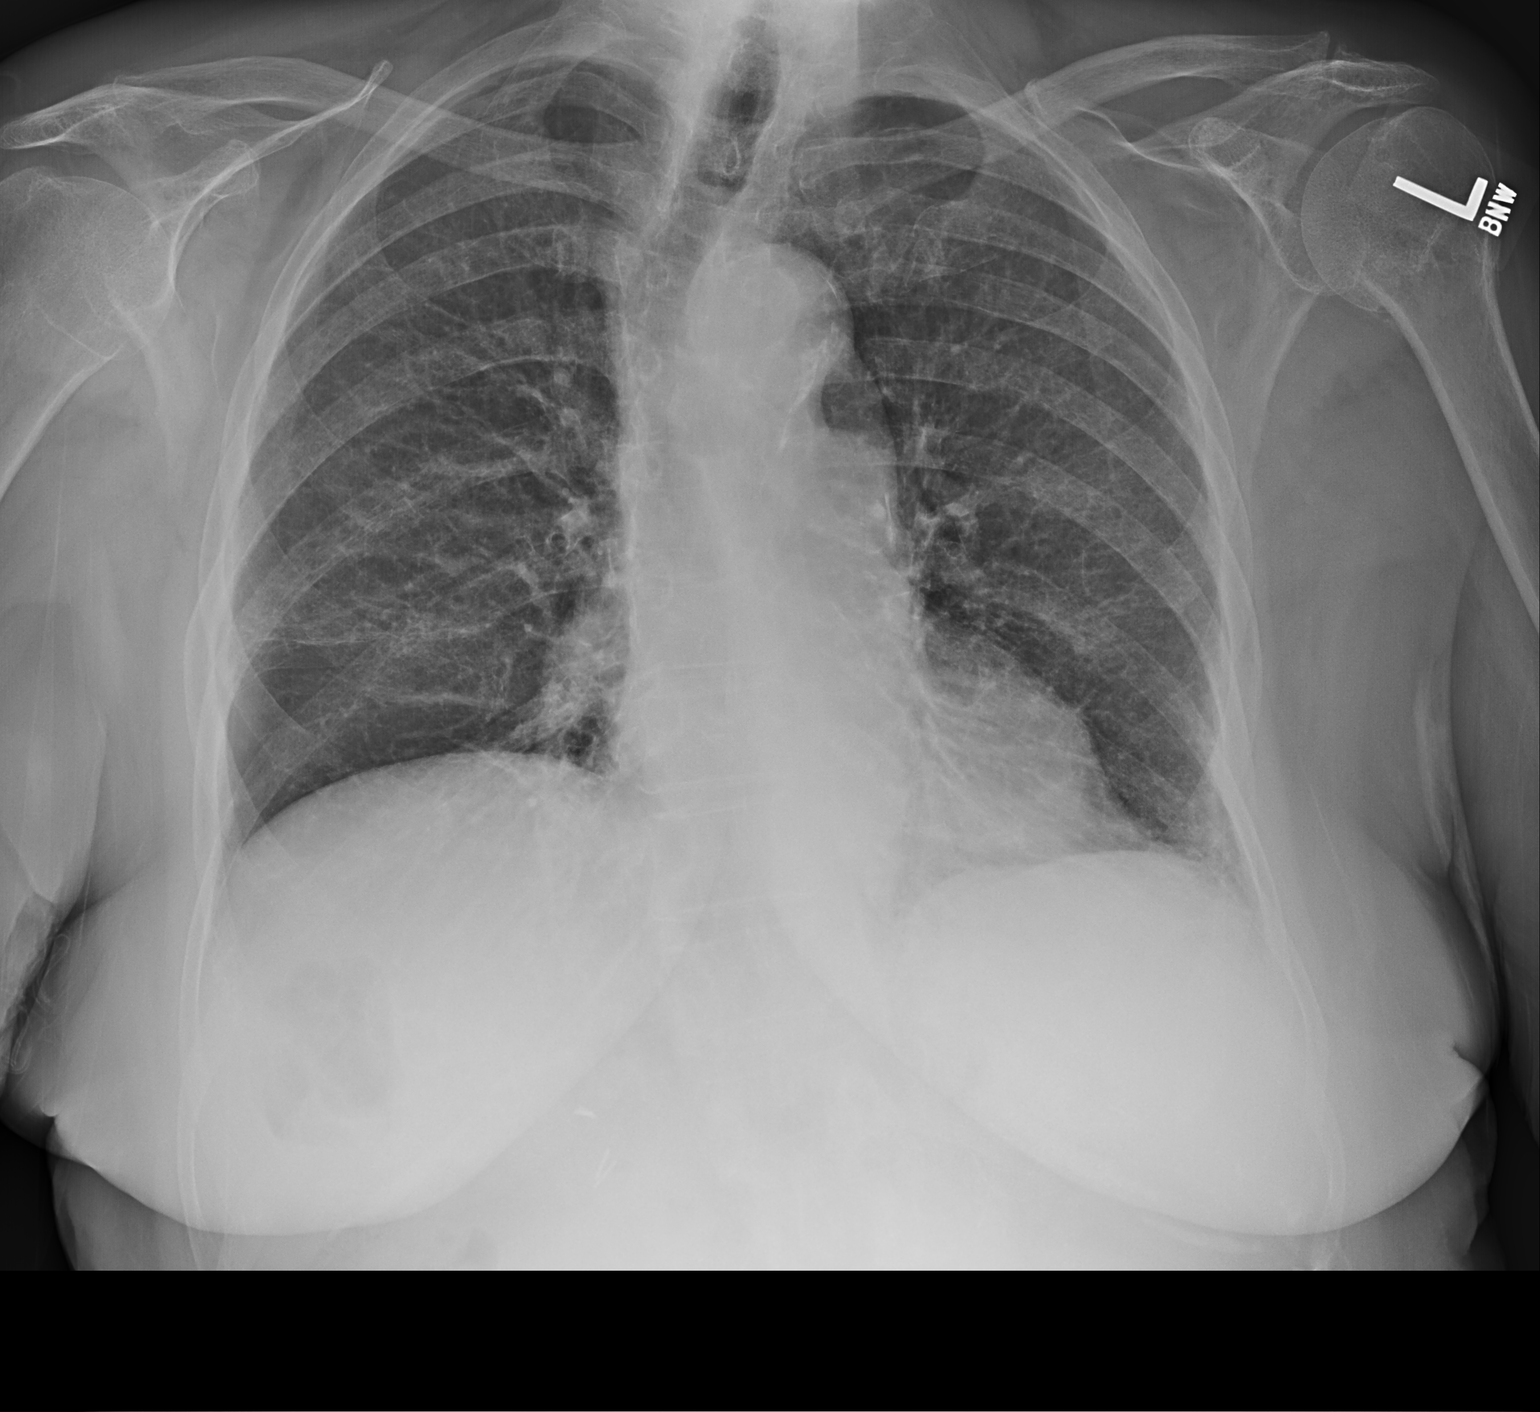

[chest lat]
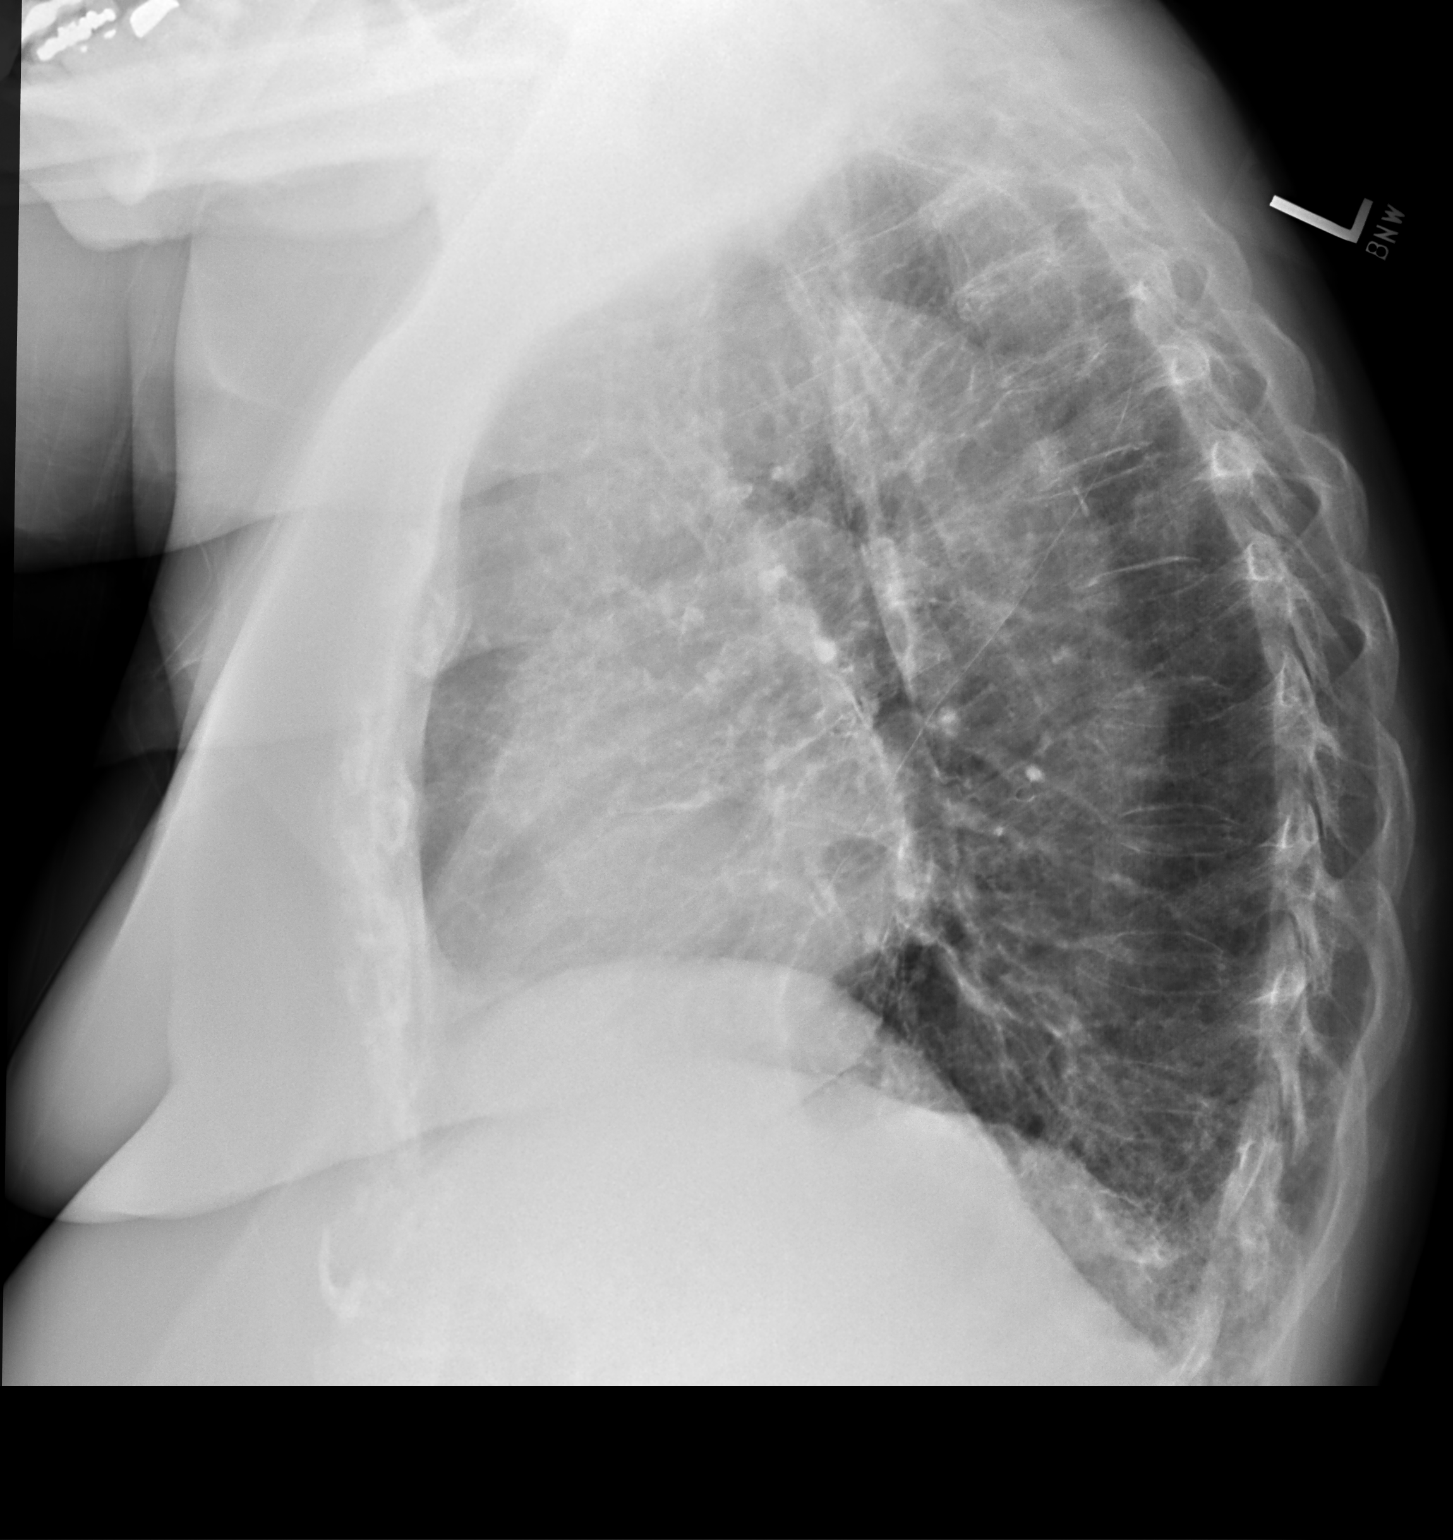

[2 of 2 positions shown; findings below may reference images not displayed]

FINDINGS: Normal heart size and mediastinal contours.

Atherosclerotic calcification aorta.

Chronic interstitial prominence and central peribronchial
thickening.

No acute infiltrate, pleural effusion or pneumothorax.

Bones demineralized.
IMPRESSION: Chronic bronchitic and interstitial changes.

No acute abnormalities.

## 2019-02-06 ENCOUNTER — Other Ambulatory Visit (INDEPENDENT_AMBULATORY_CARE_PROVIDER_SITE_OTHER): Payer: Medicare Other

## 2019-02-06 ENCOUNTER — Encounter: Payer: Self-pay | Admitting: Internal Medicine

## 2019-02-06 ENCOUNTER — Other Ambulatory Visit: Payer: Self-pay

## 2019-02-06 DIAGNOSIS — E1151 Type 2 diabetes mellitus with diabetic peripheral angiopathy without gangrene: Secondary | ICD-10-CM | POA: Diagnosis not present

## 2019-02-06 DIAGNOSIS — E538 Deficiency of other specified B group vitamins: Secondary | ICD-10-CM

## 2019-02-06 DIAGNOSIS — E7800 Pure hypercholesterolemia, unspecified: Secondary | ICD-10-CM | POA: Diagnosis not present

## 2019-02-06 DIAGNOSIS — Z23 Encounter for immunization: Secondary | ICD-10-CM | POA: Diagnosis not present

## 2019-02-06 LAB — LIPID PANEL
Cholesterol: 133 mg/dL (ref 0–200)
HDL: 53 mg/dL (ref >39.00)
LDL Cholesterol: 64 mg/dL (ref 0–99)
NonHDL: 80.29
Total CHOL/HDL Ratio: 3
Triglycerides: 81 mg/dL (ref 0.0–149.0)
VLDL: 16.2 mg/dL (ref 0.0–40.0)

## 2019-02-06 LAB — BASIC METABOLIC PANEL
BUN: 21 mg/dL (ref 6–23)
CO2: 30 mEq/L (ref 19–32)
Calcium: 9.7 mg/dL (ref 8.4–10.5)
Chloride: 103 mEq/L (ref 96–112)
Creatinine, Ser: 1.17 mg/dL (ref 0.40–1.20)
GFR: 43.43 mL/min — ABNORMAL LOW (ref >60.00)
Glucose, Bld: 108 mg/dL — ABNORMAL HIGH (ref 70–99)
Potassium: 4.4 mEq/L (ref 3.5–5.1)
Sodium: 141 mEq/L (ref 135–145)

## 2019-02-06 LAB — HEPATIC FUNCTION PANEL
ALT: 12 U/L (ref 0–35)
AST: 18 U/L (ref 0–37)
Albumin: 3.5 g/dL (ref 3.5–5.2)
Alkaline Phosphatase: 56 U/L (ref 39–117)
Bilirubin, Direct: 0.1 mg/dL (ref 0.0–0.3)
Total Bilirubin: 0.5 mg/dL (ref 0.2–1.2)
Total Protein: 6 g/dL (ref 6.0–8.3)

## 2019-02-06 LAB — VITAMIN B12: Vitamin B-12: 494 pg/mL (ref 211–911)

## 2019-02-06 LAB — HEMOGLOBIN A1C: Hgb A1c MFr Bld: 6.4 % (ref 4.6–6.5)

## 2019-02-07 ENCOUNTER — Other Ambulatory Visit: Payer: Self-pay | Admitting: Internal Medicine

## 2019-02-19 ENCOUNTER — Other Ambulatory Visit: Payer: Self-pay | Admitting: Internal Medicine

## 2019-02-21 ENCOUNTER — Encounter: Payer: Self-pay | Admitting: Internal Medicine

## 2019-02-21 ENCOUNTER — Other Ambulatory Visit: Payer: Self-pay | Admitting: Internal Medicine

## 2019-02-22 ENCOUNTER — Other Ambulatory Visit: Payer: Self-pay

## 2019-02-22 ENCOUNTER — Ambulatory Visit (INDEPENDENT_AMBULATORY_CARE_PROVIDER_SITE_OTHER): Payer: Medicare Other | Admitting: Internal Medicine

## 2019-02-22 ENCOUNTER — Ambulatory Visit: Payer: Medicare Other

## 2019-02-22 ENCOUNTER — Telehealth: Payer: Self-pay

## 2019-02-22 DIAGNOSIS — J449 Chronic obstructive pulmonary disease, unspecified: Secondary | ICD-10-CM

## 2019-02-22 DIAGNOSIS — E538 Deficiency of other specified B group vitamins: Secondary | ICD-10-CM | POA: Diagnosis not present

## 2019-02-22 DIAGNOSIS — R410 Disorientation, unspecified: Secondary | ICD-10-CM | POA: Diagnosis not present

## 2019-02-22 DIAGNOSIS — E1151 Type 2 diabetes mellitus with diabetic peripheral angiopathy without gangrene: Secondary | ICD-10-CM | POA: Diagnosis not present

## 2019-02-22 DIAGNOSIS — N183 Chronic kidney disease, stage 3 unspecified: Secondary | ICD-10-CM | POA: Diagnosis not present

## 2019-02-22 DIAGNOSIS — I10 Essential (primary) hypertension: Secondary | ICD-10-CM

## 2019-02-22 DIAGNOSIS — E7800 Pure hypercholesterolemia, unspecified: Secondary | ICD-10-CM | POA: Diagnosis not present

## 2019-02-22 DIAGNOSIS — K219 Gastro-esophageal reflux disease without esophagitis: Secondary | ICD-10-CM | POA: Diagnosis not present

## 2019-02-22 DIAGNOSIS — G4730 Sleep apnea, unspecified: Secondary | ICD-10-CM

## 2019-02-22 MED ORDER — CYANOCOBALAMIN 1000 MCG/ML IJ SOLN
1000.0000 ug | Freq: Once | INTRAMUSCULAR | Status: AC
  Administered 2019-02-22: 17:00:00 1000 ug via INTRAMUSCULAR

## 2019-02-22 NOTE — Telephone Encounter (Signed)
[place] office scheduler cancelled nurse visit for patient since pt has a f/u appt scheduled with Dr. Nicki Reaper today as well.

## 2019-02-22 NOTE — Telephone Encounter (Signed)
Patient is scheduled for B12 injection today.    Patient was advised to schedule f/u check for B12 at last OV 11/13/18.  Patient advised through MyChart message on 02/06/19 that B12 is within normal limits.  Do you want patient to continue with B12 injections?  Please advise.

## 2019-02-22 NOTE — Telephone Encounter (Signed)
She is on my schedule for office visit today.  Will address at her appt.

## 2019-02-22 NOTE — Progress Notes (Addendum)
[name], female   DOB: [birth year], 85 y.o.   MRN: [id number]   Subjective:    [name], female    DOB: [birth year], 85 y.o.   MRN: [id number]  HPI  Patient here for a scheduled follow up.  She is accompanied by her daughter.  History obtained from both of them.  Reports her breathing is stable.  She is eating.  Weight is down.  No chest pain.  Cough stable.  No abdominal pain.  Bowels moving.  Reports trouble remembering and trouble focusing.  Some confusion.  When she gets focused on one thing, hard to redirect.  No headache.  Denies pain.  Able to ambulate with walker.  Has caretaker and daughter and son stay with her.     Past Medical History:  Diagnosis Date  . Asthma   . Chronic bronchitis ([place])   . COPD (chronic obstructive pulmonary disease) (HCC)    uses inhalers  . Depression   . Diabetes mellitus ([place])   . GERD (gastroesophageal reflux disease)    hiatal hernia  . Gout   . Hematuria    followed by urology  . History of colon polyps   . History of frequent urinary tract infections   . Humeral fracture 9/09   comminuted impacted proximal  . Hypercholesterolemia   . Hypertension   . Hypothyroidism   . Peripheral vascular disease ([place])   . Sleep apnea    does not use cpap as it did not benefit her   Past Surgical History:  Procedure Laterality Date  . BLADDER SUSPENSION  12/08/99  . BREAST BIOPSY Right 1960's  . CARPAL TUNNEL RELEASE Right 03/17/2017   Procedure: CARPAL TUNNEL RELEASE;  Surgeon: Hessie Knows, MD;  Location: [facility] ORS;  Service: Orthopedics;  Laterality: Right;  . CHOLECYSTECTOMY  1994  . COLONOSCOPY W/ POLYPECTOMY    . EYE SURGERY Bilateral    cataract extractions  . KNEE SURGERY Right 2013   arthroscopy  . ORIF WRIST FRACTURE Right 03/17/2017   Procedure: OPEN REDUCTION INTERNAL FIXATION (ORIF) WRIST FRACTURE;  Surgeon: Hessie Knows, MD;  Location: [facility] ORS;  Service: Orthopedics;  Laterality: Right;   Family  History  Problem Relation Age of Onset  . Hypertension Father   . CVA Father   . CAD Father   . Hypertension Mother   . Colitis Mother   . CAD Mother   . Breast cancer Neg Hx   . Colon cancer Neg Hx    Social History   Socioeconomic History  . Marital status: Widowed    Spouse name: Not on file  . Number of children: 2  . Years of education: Not on file  . Highest education level: Not on file  Occupational History  . Not on file  Social Needs  . Financial resource strain: Not on file  . Food insecurity    Worry: Not on file    Inability: Not on file  . Transportation needs    Medical: Not on file    Non-medical: Not on file  Tobacco Use  . Smoking status: Never Smoker  . Smokeless tobacco: Never Used  Substance and Sexual Activity  . Alcohol use: No    Alcohol/week: 0.0 standard drinks  . Drug use: No  . Sexual activity: Never  Lifestyle  . Physical activity    Days per week: Not on file    Minutes per session: Not on file  . Stress: Not on file  Relationships  . Social Herbalist on phone: Not on file    Gets together: Not on file    Attends religious service: Not on file    Active member of club or organization: Not on file    Attends meetings of clubs or organizations: Not on file    Relationship status: Not on file  Other Topics Concern  . Not on file  Social History Narrative  . Not on file    Outpatient Encounter Medications as of 02/22/2019  Medication Sig  . albuterol (VENTOLIN HFA) 108 (90 Base) MCG/ACT inhaler Inhale 2 puffs into the lungs every 6 (six) hours as needed for wheezi ng or shortness of breath.  . allopurinol (ZYLOPRIM) 100 MG tablet Take 1 tablet (100 mg total) by mouth daily.  . Calcium Carbonate-Vitamin D (CALCIUM 600+D) 600-400 MG-UNIT per tablet Take 1 tablet by mouth daily.   . cetirizine (ZYRTEC) 10 MG tablet Take 10 mg by mouth daily.  . Cholecalciferol (VITAMIN D-3) 1000 UNITS CAPS Take 1 capsule by mouth daily.  .  Cranberry-Cholecalciferol 4200-500 MG-UNIT CAPS Take 1 capsule by mouth daily.   Marland Kitchen FLUoxetine (PROZAC) 40 MG capsule Take 1 capsule (40 mg total) by mouth daily.  . Fluticasone-Salmeterol (ADVAIR DISKUS) 500-50 MCG/DOSE AEPB Inhale 1 puff into the lungs 2 (two) times daily.  Marland Kitchen levothyroxine (SYNTHROID, LEVOTHROID) 50 MCG tablet Take 1 tablet (50 mcg total) by mouth daily before breakfast.  . methenamine (HIPREX) 1 g tablet Take 1 tablet (1 g total) by mouth daily.  . montelukast (SINGULAIR) 10 MG tablet Take 1 tablet (10 mg total) by mouth daily.  Marland Kitchen nystatin (MYCOSTATIN/NYSTOP) powder Apply topically 2 (two) times daily.  Marland Kitchen nystatin cream (MYCOSTATIN) Apply 1 application topically 2 (two) times daily.  . pantoprazole (PROTONIX) 40 MG tablet Take 1 tablet (40 mg total) by mouth daily.  . simvastatin (ZOCOR) 20 MG tablet Take 1 tablet (20 mg total) by mouth daily.  Marland Kitchen tolterodine (DETROL) 2 MG tablet Take 1 tablet (2 mg total) by mouth 2 (two) times daily.  . [EXPIRED] cyanocobalamin ((VITAMIN B-12)) injection 1,000 mcg    No facility-administered encounter medications on file as of 02/22/2019.    Review of Systems  Constitutional: Positive for fatigue.       Weight loss as outlined.    HENT: Negative for congestion and sinus pressure.   Respiratory: Negative for chest tightness.        Breathing overall stable.  Some days better than others.  Cough - stable.   Cardiovascular: Negative for chest pain, palpitations and leg swelling.  Gastrointestinal: Negative for abdominal pain, diarrhea, nausea and vomiting.  Genitourinary: Negative for difficulty urinating and dysuria.  Musculoskeletal: Negative for joint swelling and myalgias.  Skin: Negative for color change and rash.  Neurological: Negative for dizziness, light-headedness and headaches.  Psychiatric/Behavioral: Negative for agitation and dysphoric mood.       Objective:    Physical Exam Constitutional:      General: She is not in  acute distress.    Appearance: Normal appearance.  HENT:     Head: Normocephalic and atraumatic.     Right Ear: External ear normal.     Left Ear: External ear normal.  Eyes:     General: No scleral icterus.       Right eye: No discharge.        Left eye: No discharge.     Conjunctiva/sclera: Conjunctivae normal.  Neck:  Musculoskeletal: Neck supple. No muscular tenderness.     Thyroid: No thyromegaly.  Cardiovascular:     Rate and Rhythm: Normal rate and regular rhythm.  Pulmonary:     Effort: No respiratory distress.     Breath sounds: Normal breath sounds. No wheezing.  Abdominal:     General: Bowel sounds are normal.     Palpations: Abdomen is soft.     Tenderness: There is no abdominal tenderness.  Musculoskeletal:        General: No swelling or tenderness.  Lymphadenopathy:     Cervical: No cervical adenopathy.  Skin:    Findings: No erythema or rash.  Neurological:     Mental Status: She is alert.     Comments: Mini mental status exam:  Hard for her to perform.  She was unable to give year or month.  Knew it was Thursday.  Unable to spell world backwards.  Became focused on trying to spell the word, that any other questions asked, she would start trying to spell world.    Psychiatric:        Mood and Affect: Mood normal.        Behavior: Behavior normal.     BP 128/76   Pulse 89   Temp 98 F (36.7 C)   Resp 16   Wt 175 lb (79.4 kg)   SpO2 95%   BMI 29.12 kg/m  Wt Readings from Last 3 Encounters:  02/22/19 175 lb (79.4 kg)  08/08/18 186 lb 3.2 oz (84.5 kg)  04/17/18 188 lb (85.3 kg)     Lab Results  Component Value Date   WBC 9.3 10/09/2018   HGB 13.0 10/09/2018   HCT 40.1 10/09/2018   PLT 273.0 10/09/2018   GLUCOSE 108 (H) 02/06/2019   CHOL 133 02/06/2019   TRIG 81.0 02/06/2019   HDL 53.00 02/06/2019   LDLCALC 64 02/06/2019   ALT 12 02/06/2019   AST 18 02/06/2019   NA 141 02/06/2019   K 4.4 02/06/2019   CL 103 02/06/2019   CREATININE  1.17 02/06/2019   BUN 21 02/06/2019   CO2 30 02/06/2019   TSH 1.07 02/22/2019   HGBA1C 6.4 02/06/2019   MICROALBUR 1.6 03/16/2018    Dg Chest 2 View  Result Date: 03/14/2018 CLINICAL DATA:  Short of breath, weakness, low oxygen saturation EXAM: CHEST - 2 VIEW COMPARISON:  Chest x-ray of 01/27/2018 FINDINGS: No active infiltrate or effusion is seen. There is some peribronchial thickening which may indicate bronchitis. Also somewhat prominent interstitial markings are present at the lung bases consistent with fibrotic change. Mediastinal and hilar contours are unremarkable and the heart is within normal limits in size. No acute bony abnormality is seen IMPRESSION: 1. No active lung disease. 2. Changes of bronchitis and probable pulmonary fibrosis at the lung bases. . Electronically Signed   By: Ivar Drape M.D.   On: 03/14/2018 15:40       Assessment & Plan:   Problem List Items Addressed This Visit    Chronic obstructive airway disease with asthma ([place])    Breathing overall stable.  Continue current inhaler regimen.  Follow.       CKD (chronic kidney disease) stage 3, GFR 30-59 ml/min    Avoid antiinflammatories.  Follow renal function.  Stable.       Confusion    Confusion and memory change as outlined.  Unable to do the mini mental status exam. Recent labs unrevealing.  Will check tsh today.  Discussed further  w/up and evaluation. No evidence of UTI.  Will have neurology evaluate.        Relevant Orders   TSH (Completed)   Ambulatory referral to Neurology   Diabetes mellitus with peripheral vascular disease ([place])    Low carb diet and exercise.  Follow met b and a1c.        GERD (gastroesophageal reflux disease)    Controlled.        Hypercholesterolemia    On simvastatin.  Low cholesterol diet and exercise. Follow lipid panel and liver function tests.        Hypertension    Blood pressure under good control.  Continue same medication regimen.  Follow pressures.  Follow  metabolic panel.        Sleep apnea    Declines treatment.       Vitamin B12 deficiency    Recent B12 wnl.        Other Visit Diagnoses    B12 deficiency       Relevant Medications   cyanocobalamin ((VITAMIN B-12)) injection 1,000 mcg (Completed)       Einar Pheasant, MD

## 2019-02-23 LAB — TSH: TSH: 1.07 u[IU]/mL (ref 0.35–4.50)

## 2019-02-24 ENCOUNTER — Encounter: Payer: Self-pay | Admitting: Internal Medicine

## 2019-02-25 ENCOUNTER — Encounter: Payer: Self-pay | Admitting: Internal Medicine

## 2019-02-25 NOTE — Addendum Note (Signed)
Addended by: Alisa Graff on: 02/25/2019 04:42 PM   Modules accepted: Orders

## 2019-02-25 NOTE — Assessment & Plan Note (Signed)
Low carb diet and exercise.  Follow met b and a1c.   

## 2019-02-25 NOTE — Assessment & Plan Note (Signed)
Controlled.  

## 2019-02-25 NOTE — Assessment & Plan Note (Signed)
Confusion and memory change as outlined.  Unable to do the mini mental status exam. Recent labs unrevealing.  Will check tsh today.  Discussed further w/up and evaluation. No evidence of UTI.  Will have neurology evaluate.

## 2019-02-25 NOTE — Assessment & Plan Note (Signed)
Breathing overall stable.  Continue current inhaler regimen.  Follow.

## 2019-02-25 NOTE — Assessment & Plan Note (Signed)
On simvastatin.  Low cholesterol diet and exercise.  Follow lipid panel and liver function tests.   

## 2019-02-25 NOTE — Assessment & Plan Note (Signed)
Blood pressure under good control.  Continue same medication regimen.  Follow pressures.  Follow metabolic panel.   

## 2019-02-25 NOTE — Assessment & Plan Note (Signed)
Declines treatment

## 2019-02-25 NOTE — Assessment & Plan Note (Signed)
Recent B12 wnl 

## 2019-02-25 NOTE — Assessment & Plan Note (Signed)
Avoid antiinflammatories.  Follow renal function.  Stable.

## 2019-03-20 ENCOUNTER — Other Ambulatory Visit: Payer: Self-pay | Admitting: Internal Medicine

## 2019-04-08 ENCOUNTER — Other Ambulatory Visit: Payer: Self-pay | Admitting: Internal Medicine

## 2019-04-16 ENCOUNTER — Other Ambulatory Visit: Payer: Self-pay | Admitting: Internal Medicine

## 2019-04-24 ENCOUNTER — Other Ambulatory Visit: Payer: Self-pay | Admitting: Internal Medicine

## 2019-05-07 ENCOUNTER — Other Ambulatory Visit: Payer: Self-pay | Admitting: Internal Medicine

## 2019-05-14 ENCOUNTER — Other Ambulatory Visit: Payer: Self-pay | Admitting: Internal Medicine

## 2019-05-28 ENCOUNTER — Other Ambulatory Visit: Payer: Self-pay

## 2019-05-28 ENCOUNTER — Encounter: Payer: Self-pay | Admitting: Internal Medicine

## 2019-05-28 ENCOUNTER — Ambulatory Visit (INDEPENDENT_AMBULATORY_CARE_PROVIDER_SITE_OTHER): Payer: Medicare Other | Admitting: Internal Medicine

## 2019-05-28 DIAGNOSIS — I739 Peripheral vascular disease, unspecified: Secondary | ICD-10-CM

## 2019-05-28 DIAGNOSIS — R29898 Other symptoms and signs involving the musculoskeletal system: Secondary | ICD-10-CM

## 2019-05-28 DIAGNOSIS — N183 Chronic kidney disease, stage 3 unspecified: Secondary | ICD-10-CM

## 2019-05-28 DIAGNOSIS — E1151 Type 2 diabetes mellitus with diabetic peripheral angiopathy without gangrene: Secondary | ICD-10-CM | POA: Diagnosis not present

## 2019-05-28 DIAGNOSIS — R531 Weakness: Secondary | ICD-10-CM

## 2019-05-28 DIAGNOSIS — R400 Somnolence: Secondary | ICD-10-CM | POA: Diagnosis not present

## 2019-05-28 DIAGNOSIS — Z87440 Personal history of urinary (tract) infections: Secondary | ICD-10-CM | POA: Diagnosis not present

## 2019-05-28 DIAGNOSIS — I10 Essential (primary) hypertension: Secondary | ICD-10-CM

## 2019-05-28 DIAGNOSIS — R059 Cough, unspecified: Secondary | ICD-10-CM

## 2019-05-28 DIAGNOSIS — R410 Disorientation, unspecified: Secondary | ICD-10-CM | POA: Diagnosis not present

## 2019-05-28 DIAGNOSIS — R05 Cough: Secondary | ICD-10-CM

## 2019-05-28 DIAGNOSIS — K219 Gastro-esophageal reflux disease without esophagitis: Secondary | ICD-10-CM | POA: Diagnosis not present

## 2019-05-28 DIAGNOSIS — J449 Chronic obstructive pulmonary disease, unspecified: Secondary | ICD-10-CM

## 2019-05-28 DIAGNOSIS — E7800 Pure hypercholesterolemia, unspecified: Secondary | ICD-10-CM

## 2019-05-28 DIAGNOSIS — R2681 Unsteadiness on feet: Secondary | ICD-10-CM

## 2019-05-28 DIAGNOSIS — J4489 Other specified chronic obstructive pulmonary disease: Secondary | ICD-10-CM

## 2019-05-28 NOTE — Progress Notes (Addendum)
[name], female   DOB: [birth year], 86 y.o.   MRN: [id number]   Virtual Visit via video Note  This visit type was conducted due to national recommendations for restrictions regarding the COVID-19 pandemic (e.g. social distancing).  This format is felt to be most appropriate for this patient at this time.  All issues noted in this document were discussed and addressed.  No physical exam was performed (except for noted visual exam findings with Video Visits).   I connected with Diamond Newman by a video enabled telemedicine application and verified that I am speaking with the correct person using two identifiers. Location patient: home Location provider: work Persons participating in the virtual visit: patient, provider and pts daughter Diamond Newman  I discussed the limitations, risks, security and privacy concerns of performing an evaluation and management service by video and the availability of in person appointments. The patient expressed understanding and agreed to proceed.   Reason for visit: scheduled follow up.    HPI: History obtained from pt and pts daughter Diamond Newman.  Reports things are relatively stable. Some days better than others.  Some cough recently, but overall breathing stable.  Reports unsteadiness and some weakness.  Had PT previously.  Helped.  Now with worsening symptoms.  No falls reported.  Eating.  Some days better than others.  Discussed staying hydrated.  She reports she wants to sleep a lot. Not getting up and moving much.  Still with some confusion and focus issues.  Sometimes has a hard time finding the right words.  Has appt with neurology end of 05/2019.  No abdominal pain reported.      ROS: See pertinent positives and negatives per HPI.  Past Medical History:  Diagnosis Date  . Asthma   . Chronic bronchitis ([place])   . COPD (chronic obstructive pulmonary disease) (HCC)    uses inhalers  . Depression   . Diabetes mellitus ([place])   . GERD (gastroesophageal  reflux disease)    hiatal hernia  . Gout   . Hematuria    followed by urology  . History of colon polyps   . History of frequent urinary tract infections   . Humeral fracture 9/09   comminuted impacted proximal  . Hypercholesterolemia   . Hypertension   . Hypothyroidism   . Peripheral vascular disease ([place])   . Sleep apnea    does not use cpap as it did not benefit her    Past Surgical History:  Procedure Laterality Date  . BLADDER SUSPENSION  12/08/99  . BREAST BIOPSY Right 1960's  . CARPAL TUNNEL RELEASE Right 03/17/2017   Procedure: CARPAL TUNNEL RELEASE;  Surgeon: Hessie Knows, MD;  Location: [facility] ORS;  Service: Orthopedics;  Laterality: Right;  . CHOLECYSTECTOMY  1994  . COLONOSCOPY W/ POLYPECTOMY    . EYE SURGERY Bilateral    cataract extractions  . KNEE SURGERY Right 2013   arthroscopy  . ORIF WRIST FRACTURE Right 03/17/2017   Procedure: OPEN REDUCTION INTERNAL FIXATION (ORIF) WRIST FRACTURE;  Surgeon: Hessie Knows, MD;  Location: [facility] ORS;  Service: Orthopedics;  Laterality: Right;    Family History  Problem Relation Age of Onset  . Hypertension Father   . CVA Father   . CAD Father   . Hypertension Mother   . Colitis Mother   . CAD Mother   . Breast cancer Neg Hx   . Colon cancer Neg Hx     SOCIAL HX: reviewed.    Current Outpatient Medications:  .  albuterol (VENTOLIN HFA) 108 (90 Base) MCG/ACT inhaler, Inhale 2 puffs into the lungs every 6 (six) hours as needed for wheezi ng or shortness of breath., Disp: 18 g, Rfl: 0 .  allopurinol (ZYLOPRIM) 100 MG tablet, Take 1 tablet (100 mg total) by mouth daily., Disp: 30 tablet, Rfl: 5 .  Calcium Carbonate-Vitamin D (CALCIUM 600+D) 600-400 MG-UNIT per tablet, Take 1 tablet by mouth daily. , Disp: , Rfl:  .  cetirizine (ZYRTEC) 10 MG tablet, Take 10 mg by mouth daily., Disp: , Rfl:  .  Cholecalciferol (VITAMIN D-3) 1000 UNITS CAPS, Take 1 capsule by mouth daily., Disp: , Rfl:  .  Cranberry-Cholecalciferol  4200-500 MG-UNIT CAPS, Take 1 capsule by mouth daily. , Disp: , Rfl:  .  FLUoxetine (PROZAC) 40 MG capsule, Take 1 capsule (40 mg total) by mouth daily., Disp: 30 capsule, Rfl: 5 .  Fluticasone-Salmeterol (ADVAIR DISKUS) 500-50 MCG/DOSE AEPB, Inhale 1 puff into the lungs 2 (two) times daily., Disp: 60 each, Rfl: 0 .  levothyroxine (SYNTHROID) 50 MCG tablet, Take 1 tablet (50 mcg total) by mouth daily before breakfast., Disp: 30 tablet, Rfl: 5 .  methenamine (HIPREX) 1 g tablet, Take 1 tablet (1 g total) by mouth daily., Disp: 30 tablet, Rfl: 0 .  montelukast (SINGULAIR) 10 MG tablet, Take 1 tablet (10 mg total) by mouth daily., Disp: 90 tablet, Rfl: 1 .  nystatin (MYCOSTATIN/NYSTOP) powder, Apply topically 2 (two) times daily., Disp: 30 g, Rfl: 1 .  nystatin cream (MYCOSTATIN), Apply 1 application topically 2 (two) times daily., Disp: 30 g, Rfl: 1 .  pantoprazole (PROTONIX) 40 MG tablet, Take 1 tablet (40 mg total) by mouth daily., Disp: 30 tablet, Rfl: 5 .  simvastatin (ZOCOR) 20 MG tablet, Take 1 tablet (20 mg total) by mouth daily., Disp: 90 tablet, Rfl: 0 .  tolterodine (DETROL) 2 MG tablet, Take 1 tablet (2 mg total) by mouth 2 (two) times daily., Disp: 60 tablet, Rfl: 0  EXAM:  GENERAL: alert, oriented, appears well and in no acute distress  HEENT: atraumatic, conjunttiva clear, no obvious abnormalities on inspection of external nose and ears  NECK: normal movements of the head and neck  LUNGS: on inspection no signs of respiratory distress, breathing rate appears normal, no obvious gross SOB, gasping or wheezing  CV: no obvious cyanosis  PSYCH/NEURO: pleasant and cooperative, no obvious depression or anxiety, speech and thought processing grossly intact  ASSESSMENT AND PLAN:  Discussed the following assessment and plan:  Chronic obstructive airway disease with asthma Breathing overall stable.  Some days better than others, but overall stable.  Continue current inhaler and  medication regimen.  Does report weakness and unsteadiness.  Needs physical therapy to evaluate and treat.   CKD (chronic kidney disease) stage 3, GFR 30-59 ml/min Continue to avoid antiinflammatories.  Discussed staying hydrated.  Follow metabolic panel.    Confusion Still with some intermittent confusion and memory changes.  Sometimes gets confused on where she is,etc.  Recent labs unrevealing.  Has appt with neurology scheduled for formal evaluation.    Cough Overall stable.  Continue singulair and inhalers.  Follow.    Daytime somnolence Daughter reports Ms Wernli does sleep a lot.  Has known sleep apnea.  Has declined cpap.    Diabetes mellitus with peripheral vascular disease (HCC) Low carb diet and exercise.  Follow met b and a1c.    GERD (gastroesophageal reflux disease) Upper symptoms appear to be controlled on protonix.  Follow.    History  of frequent urinary tract infections Has previously been evaluated by urology.  Continue on hiprex.  Has been doing well.  Follow.    Hypercholesterolemia On simvastatin.  Follow lipid panel and liver function tests.    Hypertension Blood pressure under good control.  Continue same medication regimen.  Follow pressures.  Follow metabolic panel.    Muscular deconditioning Daughter reports some unsteadiness and weakness.  Discussed physical therapy.  Agreeable.  Will arrange home health PT.  Follow.    Peripheral vascular disease Has known varying pressures in each arm.  No pain.  Continue risk factor modificaiton.     Orders Placed This Encounter  Procedures  . Hemoglobin A1c    Standing Status:   Future    Standing Expiration Date:   06/02/2020  . Hepatic function panel    Standing Status:   Future    Standing Expiration Date:   06/02/2020  . Lipid panel    Standing Status:   Future    Standing Expiration Date:   06/02/2020  . Basic metabolic panel (future)    Standing Status:   Future    Standing Expiration Date:   06/02/2020    . DM Microalbumin / creatinine urine ratio    Standing Status:   Future    Standing Expiration Date:   06/02/2020  . Ambulatory referral to Home Health    Referral Priority:   Routine    Referral Type:   Home Health Care    Referral Reason:   Specialty Services Required    Requested Specialty:   [place]    Number of Visits Requested:   1     I discussed the assessment and treatment plan with the patient. The patient was provided an opportunity to ask questions and all were answered. The patient agreed with the plan and demonstrated an understanding of the instructions.   The patient was advised to call back or seek an in-person evaluation if the symptoms worsen or if the condition fails to improve as anticipated.   Einar Pheasant, MD

## 2019-06-03 ENCOUNTER — Encounter: Payer: Self-pay | Admitting: Internal Medicine

## 2019-06-03 NOTE — Assessment & Plan Note (Signed)
Still with some intermittent confusion and memory changes.  Sometimes gets confused on where she is,etc.  Recent labs unrevealing.  Has appt with neurology scheduled for formal evaluation.

## 2019-06-03 NOTE — Assessment & Plan Note (Addendum)
Breathing overall stable.  Some days better than others, but overall stable.  Continue current inhaler and medication regimen.  Does report weakness and unsteadiness.  Needs physical therapy to evaluate and treat.

## 2019-06-03 NOTE — Assessment & Plan Note (Signed)
Blood pressure under good control.  Continue same medication regimen.  Follow pressures.  Follow metabolic panel.   

## 2019-06-03 NOTE — Assessment & Plan Note (Signed)
Has previously been evaluated by urology.  Continue on hiprex.  Has been doing well.  Follow.

## 2019-06-03 NOTE — Assessment & Plan Note (Signed)
Daughter reports some unsteadiness and weakness.  Discussed physical therapy.  Agreeable.  Will arrange home health PT.  Follow.

## 2019-06-03 NOTE — Assessment & Plan Note (Signed)
Low carb diet and exercise.  Follow met b and a1c.   

## 2019-06-03 NOTE — Assessment & Plan Note (Signed)
Upper symptoms appear to be controlled on protonix.  Follow.   

## 2019-06-03 NOTE — Assessment & Plan Note (Signed)
Daughter reports Diamond Newman does sleep a lot.  Has known sleep apnea.  Has declined cpap.

## 2019-06-03 NOTE — Assessment & Plan Note (Signed)
Has known varying pressures in each arm.  No pain.  Continue risk factor modificaiton.

## 2019-06-03 NOTE — Assessment & Plan Note (Signed)
On simvastatin.  Follow lipid panel and liver function tests.   

## 2019-06-03 NOTE — Assessment & Plan Note (Signed)
Continue to avoid antiinflammatories.  Discussed staying hydrated.  Follow metabolic panel.

## 2019-06-03 NOTE — Assessment & Plan Note (Signed)
Overall stable.  Continue singulair and inhalers.  Follow.

## 2019-06-06 ENCOUNTER — Other Ambulatory Visit: Payer: Self-pay | Admitting: Internal Medicine

## 2019-06-07 NOTE — Addendum Note (Signed)
Addended by: Charm Barges on: 06/07/2019 08:54 PM   Modules accepted: Orders

## 2019-06-12 DIAGNOSIS — K219 Gastro-esophageal reflux disease without esophagitis: Secondary | ICD-10-CM | POA: Diagnosis not present

## 2019-06-12 DIAGNOSIS — J449 Chronic obstructive pulmonary disease, unspecified: Secondary | ICD-10-CM | POA: Diagnosis not present

## 2019-06-12 DIAGNOSIS — E7800 Pure hypercholesterolemia, unspecified: Secondary | ICD-10-CM | POA: Diagnosis not present

## 2019-06-12 DIAGNOSIS — M109 Gout, unspecified: Secondary | ICD-10-CM | POA: Diagnosis not present

## 2019-06-12 DIAGNOSIS — F329 Major depressive disorder, single episode, unspecified: Secondary | ICD-10-CM | POA: Diagnosis not present

## 2019-06-12 DIAGNOSIS — E1122 Type 2 diabetes mellitus with diabetic chronic kidney disease: Secondary | ICD-10-CM | POA: Diagnosis not present

## 2019-06-12 DIAGNOSIS — G4730 Sleep apnea, unspecified: Secondary | ICD-10-CM | POA: Diagnosis not present

## 2019-06-12 DIAGNOSIS — I129 Hypertensive chronic kidney disease with stage 1 through stage 4 chronic kidney disease, or unspecified chronic kidney disease: Secondary | ICD-10-CM | POA: Diagnosis not present

## 2019-06-12 DIAGNOSIS — Z87440 Personal history of urinary (tract) infections: Secondary | ICD-10-CM | POA: Diagnosis not present

## 2019-06-12 DIAGNOSIS — N183 Chronic kidney disease, stage 3 unspecified: Secondary | ICD-10-CM | POA: Diagnosis not present

## 2019-06-12 DIAGNOSIS — E1151 Type 2 diabetes mellitus with diabetic peripheral angiopathy without gangrene: Secondary | ICD-10-CM | POA: Diagnosis not present

## 2019-06-12 DIAGNOSIS — Z8781 Personal history of (healed) traumatic fracture: Secondary | ICD-10-CM | POA: Diagnosis not present

## 2019-06-12 DIAGNOSIS — R410 Disorientation, unspecified: Secondary | ICD-10-CM | POA: Diagnosis not present

## 2019-06-12 DIAGNOSIS — E039 Hypothyroidism, unspecified: Secondary | ICD-10-CM | POA: Diagnosis not present

## 2019-06-13 ENCOUNTER — Telehealth: Payer: Self-pay | Admitting: Internal Medicine

## 2019-06-13 NOTE — Telephone Encounter (Signed)
Verbals given for PT once a week for 1 week and 2x a week for 8 weeks.

## 2019-06-13 NOTE — Telephone Encounter (Signed)
Melinda [phone] called from [facility] regarding needing a approval for plan of care 1 week 1 and 2x a week for 8 weeks. Please advise and Thank you!

## 2019-06-14 ENCOUNTER — Other Ambulatory Visit: Payer: Self-pay | Admitting: Internal Medicine

## 2019-06-18 DIAGNOSIS — R410 Disorientation, unspecified: Secondary | ICD-10-CM | POA: Diagnosis not present

## 2019-06-18 DIAGNOSIS — J449 Chronic obstructive pulmonary disease, unspecified: Secondary | ICD-10-CM | POA: Diagnosis not present

## 2019-06-18 DIAGNOSIS — N183 Chronic kidney disease, stage 3 unspecified: Secondary | ICD-10-CM | POA: Diagnosis not present

## 2019-06-18 DIAGNOSIS — E1151 Type 2 diabetes mellitus with diabetic peripheral angiopathy without gangrene: Secondary | ICD-10-CM | POA: Diagnosis not present

## 2019-06-18 DIAGNOSIS — I129 Hypertensive chronic kidney disease with stage 1 through stage 4 chronic kidney disease, or unspecified chronic kidney disease: Secondary | ICD-10-CM | POA: Diagnosis not present

## 2019-06-18 DIAGNOSIS — E1122 Type 2 diabetes mellitus with diabetic chronic kidney disease: Secondary | ICD-10-CM | POA: Diagnosis not present

## 2019-06-19 DIAGNOSIS — E538 Deficiency of other specified B group vitamins: Secondary | ICD-10-CM | POA: Diagnosis not present

## 2019-06-19 DIAGNOSIS — G309 Alzheimer's disease, unspecified: Secondary | ICD-10-CM | POA: Diagnosis not present

## 2019-06-19 DIAGNOSIS — F0280 Dementia in other diseases classified elsewhere without behavioral disturbance: Secondary | ICD-10-CM | POA: Diagnosis not present

## 2019-06-19 DIAGNOSIS — E519 Thiamine deficiency, unspecified: Secondary | ICD-10-CM | POA: Diagnosis not present

## 2019-06-19 DIAGNOSIS — F0150 Vascular dementia without behavioral disturbance: Secondary | ICD-10-CM | POA: Diagnosis not present

## 2019-06-22 DIAGNOSIS — N183 Chronic kidney disease, stage 3 unspecified: Secondary | ICD-10-CM | POA: Diagnosis not present

## 2019-06-22 DIAGNOSIS — I129 Hypertensive chronic kidney disease with stage 1 through stage 4 chronic kidney disease, or unspecified chronic kidney disease: Secondary | ICD-10-CM | POA: Diagnosis not present

## 2019-06-22 DIAGNOSIS — R410 Disorientation, unspecified: Secondary | ICD-10-CM | POA: Diagnosis not present

## 2019-06-22 DIAGNOSIS — E1151 Type 2 diabetes mellitus with diabetic peripheral angiopathy without gangrene: Secondary | ICD-10-CM | POA: Diagnosis not present

## 2019-06-22 DIAGNOSIS — E1122 Type 2 diabetes mellitus with diabetic chronic kidney disease: Secondary | ICD-10-CM | POA: Diagnosis not present

## 2019-06-22 DIAGNOSIS — J449 Chronic obstructive pulmonary disease, unspecified: Secondary | ICD-10-CM | POA: Diagnosis not present

## 2019-06-25 ENCOUNTER — Other Ambulatory Visit: Payer: Self-pay | Admitting: Internal Medicine

## 2019-06-26 ENCOUNTER — Other Ambulatory Visit: Payer: Self-pay

## 2019-06-27 DIAGNOSIS — I129 Hypertensive chronic kidney disease with stage 1 through stage 4 chronic kidney disease, or unspecified chronic kidney disease: Secondary | ICD-10-CM | POA: Diagnosis not present

## 2019-06-27 DIAGNOSIS — N183 Chronic kidney disease, stage 3 unspecified: Secondary | ICD-10-CM | POA: Diagnosis not present

## 2019-06-27 DIAGNOSIS — R410 Disorientation, unspecified: Secondary | ICD-10-CM | POA: Diagnosis not present

## 2019-06-27 DIAGNOSIS — J449 Chronic obstructive pulmonary disease, unspecified: Secondary | ICD-10-CM | POA: Diagnosis not present

## 2019-06-27 DIAGNOSIS — E1151 Type 2 diabetes mellitus with diabetic peripheral angiopathy without gangrene: Secondary | ICD-10-CM | POA: Diagnosis not present

## 2019-06-27 DIAGNOSIS — E1122 Type 2 diabetes mellitus with diabetic chronic kidney disease: Secondary | ICD-10-CM | POA: Diagnosis not present

## 2019-06-29 DIAGNOSIS — N183 Chronic kidney disease, stage 3 unspecified: Secondary | ICD-10-CM | POA: Diagnosis not present

## 2019-06-29 DIAGNOSIS — R410 Disorientation, unspecified: Secondary | ICD-10-CM | POA: Diagnosis not present

## 2019-06-29 DIAGNOSIS — E1122 Type 2 diabetes mellitus with diabetic chronic kidney disease: Secondary | ICD-10-CM | POA: Diagnosis not present

## 2019-06-29 DIAGNOSIS — E1151 Type 2 diabetes mellitus with diabetic peripheral angiopathy without gangrene: Secondary | ICD-10-CM | POA: Diagnosis not present

## 2019-06-29 DIAGNOSIS — I129 Hypertensive chronic kidney disease with stage 1 through stage 4 chronic kidney disease, or unspecified chronic kidney disease: Secondary | ICD-10-CM | POA: Diagnosis not present

## 2019-06-29 DIAGNOSIS — J449 Chronic obstructive pulmonary disease, unspecified: Secondary | ICD-10-CM | POA: Diagnosis not present

## 2019-07-02 DIAGNOSIS — N183 Chronic kidney disease, stage 3 unspecified: Secondary | ICD-10-CM | POA: Diagnosis not present

## 2019-07-02 DIAGNOSIS — E1151 Type 2 diabetes mellitus with diabetic peripheral angiopathy without gangrene: Secondary | ICD-10-CM | POA: Diagnosis not present

## 2019-07-02 DIAGNOSIS — E1122 Type 2 diabetes mellitus with diabetic chronic kidney disease: Secondary | ICD-10-CM | POA: Diagnosis not present

## 2019-07-02 DIAGNOSIS — J449 Chronic obstructive pulmonary disease, unspecified: Secondary | ICD-10-CM | POA: Diagnosis not present

## 2019-07-02 DIAGNOSIS — I129 Hypertensive chronic kidney disease with stage 1 through stage 4 chronic kidney disease, or unspecified chronic kidney disease: Secondary | ICD-10-CM | POA: Diagnosis not present

## 2019-07-02 DIAGNOSIS — R410 Disorientation, unspecified: Secondary | ICD-10-CM | POA: Diagnosis not present

## 2019-07-03 ENCOUNTER — Other Ambulatory Visit: Payer: Medicare Other

## 2019-07-04 DIAGNOSIS — Z8781 Personal history of (healed) traumatic fracture: Secondary | ICD-10-CM

## 2019-07-04 DIAGNOSIS — E039 Hypothyroidism, unspecified: Secondary | ICD-10-CM

## 2019-07-04 DIAGNOSIS — E1151 Type 2 diabetes mellitus with diabetic peripheral angiopathy without gangrene: Secondary | ICD-10-CM

## 2019-07-04 DIAGNOSIS — E1122 Type 2 diabetes mellitus with diabetic chronic kidney disease: Secondary | ICD-10-CM | POA: Diagnosis not present

## 2019-07-04 DIAGNOSIS — G4730 Sleep apnea, unspecified: Secondary | ICD-10-CM

## 2019-07-04 DIAGNOSIS — Z87440 Personal history of urinary (tract) infections: Secondary | ICD-10-CM

## 2019-07-04 DIAGNOSIS — M109 Gout, unspecified: Secondary | ICD-10-CM

## 2019-07-04 DIAGNOSIS — I129 Hypertensive chronic kidney disease with stage 1 through stage 4 chronic kidney disease, or unspecified chronic kidney disease: Secondary | ICD-10-CM | POA: Diagnosis not present

## 2019-07-04 DIAGNOSIS — R410 Disorientation, unspecified: Secondary | ICD-10-CM

## 2019-07-04 DIAGNOSIS — F329 Major depressive disorder, single episode, unspecified: Secondary | ICD-10-CM

## 2019-07-04 DIAGNOSIS — J449 Chronic obstructive pulmonary disease, unspecified: Secondary | ICD-10-CM | POA: Diagnosis not present

## 2019-07-04 DIAGNOSIS — K219 Gastro-esophageal reflux disease without esophagitis: Secondary | ICD-10-CM

## 2019-07-04 DIAGNOSIS — N183 Chronic kidney disease, stage 3 unspecified: Secondary | ICD-10-CM | POA: Diagnosis not present

## 2019-07-04 DIAGNOSIS — E7800 Pure hypercholesterolemia, unspecified: Secondary | ICD-10-CM

## 2019-07-05 DIAGNOSIS — R410 Disorientation, unspecified: Secondary | ICD-10-CM | POA: Diagnosis not present

## 2019-07-05 DIAGNOSIS — I129 Hypertensive chronic kidney disease with stage 1 through stage 4 chronic kidney disease, or unspecified chronic kidney disease: Secondary | ICD-10-CM | POA: Diagnosis not present

## 2019-07-05 DIAGNOSIS — N183 Chronic kidney disease, stage 3 unspecified: Secondary | ICD-10-CM | POA: Diagnosis not present

## 2019-07-05 DIAGNOSIS — E1151 Type 2 diabetes mellitus with diabetic peripheral angiopathy without gangrene: Secondary | ICD-10-CM | POA: Diagnosis not present

## 2019-07-05 DIAGNOSIS — E1122 Type 2 diabetes mellitus with diabetic chronic kidney disease: Secondary | ICD-10-CM | POA: Diagnosis not present

## 2019-07-05 DIAGNOSIS — J449 Chronic obstructive pulmonary disease, unspecified: Secondary | ICD-10-CM | POA: Diagnosis not present

## 2019-07-07 ENCOUNTER — Ambulatory Visit: Payer: Medicare Other | Attending: Internal Medicine

## 2019-07-07 DIAGNOSIS — Z23 Encounter for immunization: Secondary | ICD-10-CM | POA: Insufficient documentation

## 2019-07-07 NOTE — Progress Notes (Signed)
   Covid-19 Vaccination Clinic  Name:  Diamond Newman    MRN: [id number] DOB: Apr 25, 1927  07/07/2019  [name] was observed post Covid-19 immunization for 15 minutes without incidence. She was provided with Vaccine Information Sheet and instruction to access the V-Safe system.   [name] was instructed to call 911 with any severe reactions post vaccine: Marland Kitchen Difficulty breathing  . Swelling of your face and throat  . A fast heartbeat  . A bad rash all over your body  . Dizziness and weakness    Immunizations Administered    Name Date Dose VIS Date Route   Pfizer COVID-19 Vaccine 07/07/2019 11:55 AM 0.3 mL 05/04/2019 Intramuscular   Manufacturer: [employer], [employer]   Lot: [id number]   NDC: [address]-4

## 2019-07-09 DIAGNOSIS — J449 Chronic obstructive pulmonary disease, unspecified: Secondary | ICD-10-CM | POA: Diagnosis not present

## 2019-07-09 DIAGNOSIS — N183 Chronic kidney disease, stage 3 unspecified: Secondary | ICD-10-CM | POA: Diagnosis not present

## 2019-07-09 DIAGNOSIS — E1151 Type 2 diabetes mellitus with diabetic peripheral angiopathy without gangrene: Secondary | ICD-10-CM | POA: Diagnosis not present

## 2019-07-09 DIAGNOSIS — E1122 Type 2 diabetes mellitus with diabetic chronic kidney disease: Secondary | ICD-10-CM | POA: Diagnosis not present

## 2019-07-09 DIAGNOSIS — I129 Hypertensive chronic kidney disease with stage 1 through stage 4 chronic kidney disease, or unspecified chronic kidney disease: Secondary | ICD-10-CM | POA: Diagnosis not present

## 2019-07-09 DIAGNOSIS — R410 Disorientation, unspecified: Secondary | ICD-10-CM | POA: Diagnosis not present

## 2019-07-10 ENCOUNTER — Ambulatory Visit (INDEPENDENT_AMBULATORY_CARE_PROVIDER_SITE_OTHER): Payer: Medicare Other

## 2019-07-10 ENCOUNTER — Other Ambulatory Visit (INDEPENDENT_AMBULATORY_CARE_PROVIDER_SITE_OTHER): Payer: Medicare Other

## 2019-07-10 ENCOUNTER — Other Ambulatory Visit: Payer: Self-pay

## 2019-07-10 DIAGNOSIS — E538 Deficiency of other specified B group vitamins: Secondary | ICD-10-CM | POA: Diagnosis not present

## 2019-07-10 DIAGNOSIS — E7800 Pure hypercholesterolemia, unspecified: Secondary | ICD-10-CM

## 2019-07-10 DIAGNOSIS — E1151 Type 2 diabetes mellitus with diabetic peripheral angiopathy without gangrene: Secondary | ICD-10-CM | POA: Diagnosis not present

## 2019-07-10 LAB — MICROALBUMIN / CREATININE URINE RATIO
Creatinine,U: 71.6 mg/dL
Microalb Creat Ratio: 12.3 mg/g (ref 0.0–30.0)
Microalb, Ur: 8.8 mg/dL — ABNORMAL HIGH (ref 0.0–1.9)

## 2019-07-10 LAB — BASIC METABOLIC PANEL
BUN: 22 mg/dL (ref 6–23)
CO2: 30 mEq/L (ref 19–32)
Calcium: 9.3 mg/dL (ref 8.4–10.5)
Chloride: 106 mEq/L (ref 96–112)
Creatinine, Ser: 1.06 mg/dL (ref 0.40–1.20)
GFR: 48.63 mL/min — ABNORMAL LOW (ref >60.00)
Glucose, Bld: 106 mg/dL — ABNORMAL HIGH (ref 70–99)
Potassium: 3.6 mEq/L (ref 3.5–5.1)
Sodium: 142 mEq/L (ref 135–145)

## 2019-07-10 LAB — HEPATIC FUNCTION PANEL
ALT: 16 U/L (ref 0–35)
AST: 22 U/L (ref 0–37)
Albumin: 3.5 g/dL (ref 3.5–5.2)
Alkaline Phosphatase: 59 U/L (ref 39–117)
Bilirubin, Direct: 0.1 mg/dL (ref 0.0–0.3)
Total Bilirubin: 0.3 mg/dL (ref 0.2–1.2)
Total Protein: 6.2 g/dL (ref 6.0–8.3)

## 2019-07-10 LAB — LIPID PANEL
Cholesterol: 135 mg/dL (ref 0–200)
HDL: 52.6 mg/dL (ref >39.00)
LDL Cholesterol: 60 mg/dL (ref 0–99)
NonHDL: 82.11
Total CHOL/HDL Ratio: 3
Triglycerides: 112 mg/dL (ref 0.0–149.0)
VLDL: 22.4 mg/dL (ref 0.0–40.0)

## 2019-07-10 LAB — HEMOGLOBIN A1C: Hgb A1c MFr Bld: 6.5 % (ref 4.6–6.5)

## 2019-07-10 MED ORDER — CYANOCOBALAMIN 1000 MCG/ML IJ SOLN
1000.0000 ug | Freq: Once | INTRAMUSCULAR | Status: AC
  Administered 2019-07-10: 11:00:00 1000 ug via INTRAMUSCULAR

## 2019-07-10 NOTE — Addendum Note (Signed)
Addended by: Warden Fillers on: 07/10/2019 12:41 PM   Modules accepted: Orders

## 2019-07-10 NOTE — Addendum Note (Signed)
Addended by: Warden Fillers on: 07/10/2019 11:03 AM   Modules accepted: Orders

## 2019-07-10 NOTE — Progress Notes (Addendum)
Pt presented today for b12 injection. Right deltoid, IM. Pt tolerated injection well.   Reviewed.  Dr Scott 

## 2019-07-11 ENCOUNTER — Encounter: Payer: Self-pay | Admitting: Internal Medicine

## 2019-07-12 DIAGNOSIS — E039 Hypothyroidism, unspecified: Secondary | ICD-10-CM | POA: Diagnosis not present

## 2019-07-12 DIAGNOSIS — E7800 Pure hypercholesterolemia, unspecified: Secondary | ICD-10-CM | POA: Diagnosis not present

## 2019-07-12 DIAGNOSIS — G4730 Sleep apnea, unspecified: Secondary | ICD-10-CM | POA: Diagnosis not present

## 2019-07-12 DIAGNOSIS — E1122 Type 2 diabetes mellitus with diabetic chronic kidney disease: Secondary | ICD-10-CM | POA: Diagnosis not present

## 2019-07-12 DIAGNOSIS — M109 Gout, unspecified: Secondary | ICD-10-CM | POA: Diagnosis not present

## 2019-07-12 DIAGNOSIS — K219 Gastro-esophageal reflux disease without esophagitis: Secondary | ICD-10-CM | POA: Diagnosis not present

## 2019-07-12 DIAGNOSIS — Z8781 Personal history of (healed) traumatic fracture: Secondary | ICD-10-CM | POA: Diagnosis not present

## 2019-07-12 DIAGNOSIS — I129 Hypertensive chronic kidney disease with stage 1 through stage 4 chronic kidney disease, or unspecified chronic kidney disease: Secondary | ICD-10-CM | POA: Diagnosis not present

## 2019-07-12 DIAGNOSIS — Z87440 Personal history of urinary (tract) infections: Secondary | ICD-10-CM | POA: Diagnosis not present

## 2019-07-12 DIAGNOSIS — F329 Major depressive disorder, single episode, unspecified: Secondary | ICD-10-CM | POA: Diagnosis not present

## 2019-07-12 DIAGNOSIS — N183 Chronic kidney disease, stage 3 unspecified: Secondary | ICD-10-CM | POA: Diagnosis not present

## 2019-07-12 DIAGNOSIS — E1151 Type 2 diabetes mellitus with diabetic peripheral angiopathy without gangrene: Secondary | ICD-10-CM | POA: Diagnosis not present

## 2019-07-12 DIAGNOSIS — R410 Disorientation, unspecified: Secondary | ICD-10-CM | POA: Diagnosis not present

## 2019-07-12 DIAGNOSIS — J449 Chronic obstructive pulmonary disease, unspecified: Secondary | ICD-10-CM | POA: Diagnosis not present

## 2019-07-16 ENCOUNTER — Other Ambulatory Visit: Payer: Self-pay | Admitting: Internal Medicine

## 2019-07-16 DIAGNOSIS — R410 Disorientation, unspecified: Secondary | ICD-10-CM | POA: Diagnosis not present

## 2019-07-16 DIAGNOSIS — E1122 Type 2 diabetes mellitus with diabetic chronic kidney disease: Secondary | ICD-10-CM | POA: Diagnosis not present

## 2019-07-16 DIAGNOSIS — E1151 Type 2 diabetes mellitus with diabetic peripheral angiopathy without gangrene: Secondary | ICD-10-CM | POA: Diagnosis not present

## 2019-07-16 DIAGNOSIS — N183 Chronic kidney disease, stage 3 unspecified: Secondary | ICD-10-CM | POA: Diagnosis not present

## 2019-07-16 DIAGNOSIS — J449 Chronic obstructive pulmonary disease, unspecified: Secondary | ICD-10-CM | POA: Diagnosis not present

## 2019-07-16 DIAGNOSIS — I129 Hypertensive chronic kidney disease with stage 1 through stage 4 chronic kidney disease, or unspecified chronic kidney disease: Secondary | ICD-10-CM | POA: Diagnosis not present

## 2019-07-16 NOTE — Telephone Encounter (Signed)
Refill request for methenamine, last seen 06-03-19, last filled 06-14-19.  Please advise.

## 2019-07-17 NOTE — Telephone Encounter (Signed)
rx ok'd for methenamine #30 with one refill.

## 2019-07-18 DIAGNOSIS — E1151 Type 2 diabetes mellitus with diabetic peripheral angiopathy without gangrene: Secondary | ICD-10-CM | POA: Diagnosis not present

## 2019-07-18 DIAGNOSIS — N183 Chronic kidney disease, stage 3 unspecified: Secondary | ICD-10-CM | POA: Diagnosis not present

## 2019-07-18 DIAGNOSIS — J449 Chronic obstructive pulmonary disease, unspecified: Secondary | ICD-10-CM | POA: Diagnosis not present

## 2019-07-18 DIAGNOSIS — I129 Hypertensive chronic kidney disease with stage 1 through stage 4 chronic kidney disease, or unspecified chronic kidney disease: Secondary | ICD-10-CM | POA: Diagnosis not present

## 2019-07-18 DIAGNOSIS — E1122 Type 2 diabetes mellitus with diabetic chronic kidney disease: Secondary | ICD-10-CM | POA: Diagnosis not present

## 2019-07-18 DIAGNOSIS — R410 Disorientation, unspecified: Secondary | ICD-10-CM | POA: Diagnosis not present

## 2019-07-24 DIAGNOSIS — E1122 Type 2 diabetes mellitus with diabetic chronic kidney disease: Secondary | ICD-10-CM | POA: Diagnosis not present

## 2019-07-24 DIAGNOSIS — J449 Chronic obstructive pulmonary disease, unspecified: Secondary | ICD-10-CM | POA: Diagnosis not present

## 2019-07-24 DIAGNOSIS — I129 Hypertensive chronic kidney disease with stage 1 through stage 4 chronic kidney disease, or unspecified chronic kidney disease: Secondary | ICD-10-CM | POA: Diagnosis not present

## 2019-07-24 DIAGNOSIS — N183 Chronic kidney disease, stage 3 unspecified: Secondary | ICD-10-CM | POA: Diagnosis not present

## 2019-07-24 DIAGNOSIS — E1151 Type 2 diabetes mellitus with diabetic peripheral angiopathy without gangrene: Secondary | ICD-10-CM | POA: Diagnosis not present

## 2019-07-24 DIAGNOSIS — R410 Disorientation, unspecified: Secondary | ICD-10-CM | POA: Diagnosis not present

## 2019-07-26 DIAGNOSIS — J449 Chronic obstructive pulmonary disease, unspecified: Secondary | ICD-10-CM | POA: Diagnosis not present

## 2019-07-26 DIAGNOSIS — R410 Disorientation, unspecified: Secondary | ICD-10-CM | POA: Diagnosis not present

## 2019-07-26 DIAGNOSIS — E1122 Type 2 diabetes mellitus with diabetic chronic kidney disease: Secondary | ICD-10-CM | POA: Diagnosis not present

## 2019-07-26 DIAGNOSIS — N183 Chronic kidney disease, stage 3 unspecified: Secondary | ICD-10-CM | POA: Diagnosis not present

## 2019-07-26 DIAGNOSIS — E1151 Type 2 diabetes mellitus with diabetic peripheral angiopathy without gangrene: Secondary | ICD-10-CM | POA: Diagnosis not present

## 2019-07-26 DIAGNOSIS — I129 Hypertensive chronic kidney disease with stage 1 through stage 4 chronic kidney disease, or unspecified chronic kidney disease: Secondary | ICD-10-CM | POA: Diagnosis not present

## 2019-07-28 ENCOUNTER — Ambulatory Visit: Payer: Medicare Other | Attending: Internal Medicine

## 2019-07-28 ENCOUNTER — Other Ambulatory Visit: Payer: Self-pay

## 2019-07-28 DIAGNOSIS — Z23 Encounter for immunization: Secondary | ICD-10-CM | POA: Insufficient documentation

## 2019-07-28 NOTE — Progress Notes (Signed)
   Covid-19 Vaccination Clinic  Name:  KARIEL SKILLMAN    MRN: [id number] DOB: [birth year]  07/28/2019  Ms. Munger was observed post Covid-19 immunization for 15 minutes without incident. She was provided with Vaccine Information Sheet and instruction to access the V-Safe system.   Ms. Nangle was instructed to call 911 with any severe reactions post vaccine: Marland Kitchen Difficulty breathing  . Swelling of face and throat  . A fast heartbeat  . A bad rash all over body  . Dizziness and weakness   Immunizations Administered    Name Date Dose VIS Date Route   Pfizer COVID-19 Vaccine 07/28/2019 11:50 AM 0.3 mL 05/04/2019 Intramuscular   Manufacturer: [employer], [employer]   Lot: [id number]   NDC: [address]-7

## 2019-07-31 DIAGNOSIS — R410 Disorientation, unspecified: Secondary | ICD-10-CM | POA: Diagnosis not present

## 2019-07-31 DIAGNOSIS — I129 Hypertensive chronic kidney disease with stage 1 through stage 4 chronic kidney disease, or unspecified chronic kidney disease: Secondary | ICD-10-CM | POA: Diagnosis not present

## 2019-07-31 DIAGNOSIS — N183 Chronic kidney disease, stage 3 unspecified: Secondary | ICD-10-CM | POA: Diagnosis not present

## 2019-07-31 DIAGNOSIS — E1122 Type 2 diabetes mellitus with diabetic chronic kidney disease: Secondary | ICD-10-CM | POA: Diagnosis not present

## 2019-07-31 DIAGNOSIS — J449 Chronic obstructive pulmonary disease, unspecified: Secondary | ICD-10-CM | POA: Diagnosis not present

## 2019-07-31 DIAGNOSIS — E1151 Type 2 diabetes mellitus with diabetic peripheral angiopathy without gangrene: Secondary | ICD-10-CM | POA: Diagnosis not present

## 2019-08-02 ENCOUNTER — Telehealth: Payer: Self-pay | Admitting: Internal Medicine

## 2019-08-02 ENCOUNTER — Other Ambulatory Visit: Payer: Self-pay | Admitting: Internal Medicine

## 2019-08-02 DIAGNOSIS — R410 Disorientation, unspecified: Secondary | ICD-10-CM | POA: Diagnosis not present

## 2019-08-02 DIAGNOSIS — E1122 Type 2 diabetes mellitus with diabetic chronic kidney disease: Secondary | ICD-10-CM | POA: Diagnosis not present

## 2019-08-02 DIAGNOSIS — E1151 Type 2 diabetes mellitus with diabetic peripheral angiopathy without gangrene: Secondary | ICD-10-CM | POA: Diagnosis not present

## 2019-08-02 DIAGNOSIS — N183 Chronic kidney disease, stage 3 unspecified: Secondary | ICD-10-CM | POA: Diagnosis not present

## 2019-08-02 DIAGNOSIS — I129 Hypertensive chronic kidney disease with stage 1 through stage 4 chronic kidney disease, or unspecified chronic kidney disease: Secondary | ICD-10-CM | POA: Diagnosis not present

## 2019-08-02 DIAGNOSIS — J449 Chronic obstructive pulmonary disease, unspecified: Secondary | ICD-10-CM | POA: Diagnosis not present

## 2019-08-02 NOTE — Telephone Encounter (Signed)
Diamond Newman with [facility] called and states that pt seems weaker for about a week now. He states caregivers notice a significant difference in smell of urine and is more sleepy than usual. They suspect UTI. Please call Raynelle Fanning (daughter)

## 2019-08-03 NOTE — Telephone Encounter (Signed)
Called patients daughter. Number was busy.

## 2019-08-07 DIAGNOSIS — E1122 Type 2 diabetes mellitus with diabetic chronic kidney disease: Secondary | ICD-10-CM | POA: Diagnosis not present

## 2019-08-07 DIAGNOSIS — E1151 Type 2 diabetes mellitus with diabetic peripheral angiopathy without gangrene: Secondary | ICD-10-CM | POA: Diagnosis not present

## 2019-08-07 DIAGNOSIS — J449 Chronic obstructive pulmonary disease, unspecified: Secondary | ICD-10-CM | POA: Diagnosis not present

## 2019-08-07 DIAGNOSIS — I129 Hypertensive chronic kidney disease with stage 1 through stage 4 chronic kidney disease, or unspecified chronic kidney disease: Secondary | ICD-10-CM | POA: Diagnosis not present

## 2019-08-07 DIAGNOSIS — N183 Chronic kidney disease, stage 3 unspecified: Secondary | ICD-10-CM | POA: Diagnosis not present

## 2019-08-07 DIAGNOSIS — R410 Disorientation, unspecified: Secondary | ICD-10-CM | POA: Diagnosis not present

## 2019-08-07 NOTE — Telephone Encounter (Signed)
Spoke with patients daughter. Stated patient is not really having any acute symptoms. Just more tired than usual. They are going to try to collect urine while here tomorrow but if they cannot will take cup home to obtain sample.

## 2019-08-08 ENCOUNTER — Other Ambulatory Visit: Payer: Self-pay

## 2019-08-08 ENCOUNTER — Telehealth: Payer: Self-pay

## 2019-08-08 ENCOUNTER — Ambulatory Visit (INDEPENDENT_AMBULATORY_CARE_PROVIDER_SITE_OTHER): Payer: Medicare Other

## 2019-08-08 DIAGNOSIS — E538 Deficiency of other specified B group vitamins: Secondary | ICD-10-CM | POA: Diagnosis not present

## 2019-08-08 DIAGNOSIS — R300 Dysuria: Secondary | ICD-10-CM | POA: Diagnosis not present

## 2019-08-08 LAB — POCT URINALYSIS DIPSTICK
Bilirubin, UA: NEGATIVE
Glucose, UA: NEGATIVE
Ketones, UA: NEGATIVE
Nitrite, UA: POSITIVE
Protein, UA: POSITIVE — AB
Spec Grav, UA: 1.015 (ref 1.010–1.025)
Urobilinogen, UA: 0.2 E.U./dL
pH, UA: 5.5 (ref 5.0–8.0)

## 2019-08-08 MED ORDER — CYANOCOBALAMIN 1000 MCG/ML IJ SOLN
1000.0000 ug | Freq: Once | INTRAMUSCULAR | Status: AC
  Administered 2019-08-08: 14:00:00 1000 ug via INTRAMUSCULAR

## 2019-08-08 NOTE — Telephone Encounter (Signed)
Urine orders placed. 

## 2019-08-08 NOTE — Progress Notes (Signed)
Pt came in today for b12 injection. Given in L deltoid. Tolerated well. No complaints or concerns at this time. Urine cup was given to daughter to collect urine on patient. No acute symptoms. Patient has been more tired than normal.

## 2019-08-08 NOTE — Addendum Note (Signed)
Addended by: Warden Fillers on: 08/08/2019 04:14 PM   Modules accepted: Orders

## 2019-08-09 DIAGNOSIS — N183 Chronic kidney disease, stage 3 unspecified: Secondary | ICD-10-CM | POA: Diagnosis not present

## 2019-08-09 DIAGNOSIS — R410 Disorientation, unspecified: Secondary | ICD-10-CM | POA: Diagnosis not present

## 2019-08-09 DIAGNOSIS — J449 Chronic obstructive pulmonary disease, unspecified: Secondary | ICD-10-CM | POA: Diagnosis not present

## 2019-08-09 DIAGNOSIS — I129 Hypertensive chronic kidney disease with stage 1 through stage 4 chronic kidney disease, or unspecified chronic kidney disease: Secondary | ICD-10-CM | POA: Diagnosis not present

## 2019-08-09 DIAGNOSIS — E1151 Type 2 diabetes mellitus with diabetic peripheral angiopathy without gangrene: Secondary | ICD-10-CM | POA: Diagnosis not present

## 2019-08-09 DIAGNOSIS — E1122 Type 2 diabetes mellitus with diabetic chronic kidney disease: Secondary | ICD-10-CM | POA: Diagnosis not present

## 2019-08-09 LAB — URINALYSIS, ROUTINE W REFLEX MICROSCOPIC
Bilirubin Urine: NEGATIVE
Ketones, ur: NEGATIVE
Nitrite: NEGATIVE
Specific Gravity, Urine: 1.02 (ref 1.000–1.030)
Urine Glucose: NEGATIVE
Urobilinogen, UA: 0.2 (ref 0.0–1.0)
pH: 6 (ref 5.0–8.0)

## 2019-08-09 LAB — URINE CULTURE
MICRO NUMBER:: 10261785
SPECIMEN QUALITY:: ADEQUATE

## 2019-08-14 ENCOUNTER — Other Ambulatory Visit: Payer: Self-pay | Admitting: Internal Medicine

## 2019-08-18 ENCOUNTER — Other Ambulatory Visit: Payer: Self-pay | Admitting: Internal Medicine

## 2019-08-20 ENCOUNTER — Encounter: Payer: Self-pay | Admitting: Internal Medicine

## 2019-08-20 ENCOUNTER — Other Ambulatory Visit: Payer: Self-pay

## 2019-08-20 MED ORDER — SIMVASTATIN 20 MG PO TABS: 20.0000 mg | ORAL_TABLET | Freq: Every day | ORAL | 1 refills | Status: DC

## 2019-08-20 MED ORDER — TOLTERODINE TARTRATE 2 MG PO TABS: 2.0000 mg | ORAL_TABLET | Freq: Two times a day (BID) | ORAL | 1 refills | Status: DC

## 2019-09-02 ENCOUNTER — Other Ambulatory Visit: Payer: Self-pay | Admitting: Internal Medicine

## 2019-09-03 MED ORDER — FLUTICASONE-SALMETEROL 500-50 MCG/DOSE IN AEPB: INHALATION_SPRAY | RESPIRATORY_TRACT | 0 refills | Status: DC

## 2019-09-03 MED ORDER — MONTELUKAST SODIUM 10 MG PO TABS: 10.0000 mg | ORAL_TABLET | Freq: Every day | ORAL | 0 refills | Status: DC

## 2019-09-11 ENCOUNTER — Ambulatory Visit: Payer: Medicare Other

## 2019-09-12 ENCOUNTER — Other Ambulatory Visit: Payer: Self-pay | Admitting: Internal Medicine

## 2019-09-12 NOTE — Telephone Encounter (Signed)
Refill request for HIPREX , last seen 05-28-19, last filled 07-17-19.  Please advise.

## 2019-09-25 ENCOUNTER — Ambulatory Visit (INDEPENDENT_AMBULATORY_CARE_PROVIDER_SITE_OTHER): Payer: Medicare Other

## 2019-09-25 ENCOUNTER — Other Ambulatory Visit: Payer: Self-pay

## 2019-09-25 DIAGNOSIS — E538 Deficiency of other specified B group vitamins: Secondary | ICD-10-CM | POA: Diagnosis not present

## 2019-09-25 MED ORDER — CYANOCOBALAMIN 1000 MCG/ML IJ SOLN
1000.0000 ug | Freq: Once | INTRAMUSCULAR | Status: AC
  Administered 2019-09-25: 1000 ug via INTRAMUSCULAR

## 2019-09-25 NOTE — Progress Notes (Addendum)
Patient came in today for B-12 injection in right arm IM. Patient tolerated well.   Reviewed.  Dr Scott 

## 2019-10-01 ENCOUNTER — Ambulatory Visit: Payer: Medicare Other | Admitting: Internal Medicine

## 2019-10-03 ENCOUNTER — Other Ambulatory Visit: Payer: Self-pay | Admitting: Internal Medicine

## 2019-10-08 ENCOUNTER — Other Ambulatory Visit: Payer: Self-pay

## 2019-10-08 ENCOUNTER — Ambulatory Visit (INDEPENDENT_AMBULATORY_CARE_PROVIDER_SITE_OTHER): Payer: Medicare Other | Admitting: Internal Medicine

## 2019-10-08 VITALS — BP 124/78 | HR 80 | Temp 97.4°F | Resp 16 | Ht 66.0 in | Wt 163.2 lb

## 2019-10-08 DIAGNOSIS — R531 Weakness: Secondary | ICD-10-CM

## 2019-10-08 DIAGNOSIS — E7800 Pure hypercholesterolemia, unspecified: Secondary | ICD-10-CM

## 2019-10-08 DIAGNOSIS — E538 Deficiency of other specified B group vitamins: Secondary | ICD-10-CM | POA: Diagnosis not present

## 2019-10-08 DIAGNOSIS — G309 Alzheimer's disease, unspecified: Secondary | ICD-10-CM | POA: Diagnosis not present

## 2019-10-08 DIAGNOSIS — E1151 Type 2 diabetes mellitus with diabetic peripheral angiopathy without gangrene: Secondary | ICD-10-CM | POA: Diagnosis not present

## 2019-10-08 DIAGNOSIS — K219 Gastro-esophageal reflux disease without esophagitis: Secondary | ICD-10-CM

## 2019-10-08 DIAGNOSIS — J449 Chronic obstructive pulmonary disease, unspecified: Secondary | ICD-10-CM

## 2019-10-08 DIAGNOSIS — D72829 Elevated white blood cell count, unspecified: Secondary | ICD-10-CM | POA: Diagnosis not present

## 2019-10-08 DIAGNOSIS — I739 Peripheral vascular disease, unspecified: Secondary | ICD-10-CM

## 2019-10-08 DIAGNOSIS — F0280 Dementia in other diseases classified elsewhere without behavioral disturbance: Secondary | ICD-10-CM

## 2019-10-08 DIAGNOSIS — I10 Essential (primary) hypertension: Secondary | ICD-10-CM | POA: Diagnosis not present

## 2019-10-08 DIAGNOSIS — F0150 Vascular dementia without behavioral disturbance: Secondary | ICD-10-CM

## 2019-10-08 DIAGNOSIS — N183 Chronic kidney disease, stage 3 unspecified: Secondary | ICD-10-CM | POA: Diagnosis not present

## 2019-10-08 DIAGNOSIS — R600 Localized edema: Secondary | ICD-10-CM | POA: Diagnosis not present

## 2019-10-08 NOTE — Progress Notes (Addendum)
Patient ID: GRACELEE STEMMLER, female   DOB: [birth year], 86 y.o.   MRN: [id number]   Subjective:    Patient ID: [name], female    DOB: 1926-10-09, 86 y.o.   MRN: [id number]  HPI This visit occurred during the SARS-CoV-2 public health emergency.  Safety protocols were in place, including screening questions prior to the visit, additional usage of staff PPE, and extensive cleaning of exam room while observing appropriate contact time as indicated for disinfecting solutions.  Patient here for a scheduled follow up.  She is accompanied by her daughter.  History obtained from both of them. She has lost weight.  Daughter states she is eating better - better with food choices.  No chest pain reported.  Breathing overall stable. Some days better than other days.  No acid reflux reported.  No abdominal pain. Bowels stable.  Saw Dr Manuella Ghazi.  Recommended aricept.  With weakness.  Not getting up as much.  Discussed therapy - to help with gait, strengthening, etc.    Past Medical History:  Diagnosis Date  . Asthma   . Chronic bronchitis ([place])   . COPD (chronic obstructive pulmonary disease) (HCC)    uses inhalers  . Depression   . Diabetes mellitus ([place])   . GERD (gastroesophageal reflux disease)    hiatal hernia  . Gout   . Hematuria    followed by urology  . History of colon polyps   . History of frequent urinary tract infections   . Humeral fracture 9/09   comminuted impacted proximal  . Hypercholesterolemia   . Hypertension   . Hypothyroidism   . Peripheral vascular disease ([place])   . Sleep apnea    does not use cpap as it did not benefit her   Past Surgical History:  Procedure Laterality Date  . BLADDER SUSPENSION  12/08/99  . BREAST BIOPSY Right 1960's  . CARPAL TUNNEL RELEASE Right 03/17/2017   Procedure: CARPAL TUNNEL RELEASE;  Surgeon: Hessie Knows, MD;  Location: [facility] ORS;  Service: Orthopedics;  Laterality: Right;  . CHOLECYSTECTOMY  1994  . COLONOSCOPY W/ POLYPECTOMY    . EYE  SURGERY Bilateral    cataract extractions  . KNEE SURGERY Right 2013   arthroscopy  . ORIF WRIST FRACTURE Right 03/17/2017   Procedure: OPEN REDUCTION INTERNAL FIXATION (ORIF) WRIST FRACTURE;  Surgeon: Hessie Knows, MD;  Location: [facility] ORS;  Service: Orthopedics;  Laterality: Right;   Family History  Problem Relation Age of Onset  . Hypertension Father   . CVA Father   . CAD Father   . Hypertension Mother   . Colitis Mother   . CAD Mother   . Breast cancer Neg Hx   . Colon cancer Neg Hx    Social History   Socioeconomic History  . Marital status: Widowed    Spouse name: Not on file  . Number of children: 2  . Years of education: Not on file  . Highest education level: Not on file  Occupational History  . Not on file  Tobacco Use  . Smoking status: Never Smoker  . Smokeless tobacco: Never Used  Substance and Sexual Activity  . Alcohol use: No    Alcohol/week: 0.0 standard drinks  . Drug use: No  . Sexual activity: Never  Other Topics Concern  . Not on file  Social History Narrative  . Not on file   Social Determinants of Health   Financial Resource Strain:   . Difficulty of Paying Living Expenses:  Food Insecurity:   . Worried About Charity fundraiser in the Last Year:   . Arboriculturist in the Last Year:   Transportation Needs:   . Film/video editor (Medical):   Marland Kitchen Lack of Transportation (Non-Medical):   Physical Activity:   . Days of Exercise per Week:   . Minutes of Exercise per Session:   Stress:   . Feeling of Stress :   Social Connections:   . Frequency of Communication with Friends and Family:   . Frequency of Social Gatherings with Friends and Family:   . Attends Religious Services:   . Active Member of Clubs or Organizations:   . Attends Archivist Meetings:   Marland Kitchen Marital Status:     Outpatient Encounter Medications as of 10/08/2019  Medication Sig  . albuterol (VENTOLIN HFA) 108 (90 Base) MCG/ACT inhaler Inhale 2 puffs into  the lungs every 6 (six) hours as needed for wheezi ng or shortness of breath.  . allopurinol (ZYLOPRIM) 100 MG tablet Take 1 tablet (100 mg total) by mouth daily.  . Calcium Carbonate-Vitamin D (CALCIUM 600+D) 600-400 MG-UNIT per tablet Take 1 tablet by mouth daily.   . cetirizine (ZYRTEC) 10 MG tablet Take 10 mg by mouth daily.  . Cholecalciferol (VITAMIN D-3) 1000 UNITS CAPS Take 1 capsule by mouth daily.  . Cranberry-Cholecalciferol 4200-500 MG-UNIT CAPS Take 1 capsule by mouth daily.   . Fluticasone-Salmeterol (ADVAIR DISKUS) 500-50 MCG/DOSE AEPB Inhale 1 puff into the lungs 2 (two) times daily.  Marland Kitchen levothyroxine (SYNTHROID) 50 MCG tablet Take 1 tablet (50 mcg total) by mouth daily before breakfast.  . montelukast (SINGULAIR) 10 MG tablet Take 1 tablet (10 mg total) by mouth at bedtime.  Marland Kitchen nystatin (MYCOSTATIN/NYSTOP) powder Apply topically 2 (two) times daily.  Marland Kitchen nystatin cream (MYCOSTATIN) Apply 1 application topically 2 (two) times daily.  . simvastatin (ZOCOR) 20 MG tablet Take 1 tablet (20 mg total) by mouth daily.  Marland Kitchen tolterodine (DETROL) 2 MG tablet Take 1 tablet (2 mg total) by mouth 2 (two) times daily.  . [DISCONTINUED] FLUoxetine (PROZAC) 40 MG capsule Take 1 capsule (40 mg total) by mouth daily.  . [DISCONTINUED] methenamine (HIPREX) 1 g tablet Take 1 tablet (1 g total) by mouth daily.  . [DISCONTINUED] pantoprazole (PROTONIX) 40 MG tablet Take 1 tablet (40 mg total) by mouth daily.   No facility-administered encounter medications on file as of 10/08/2019.    Review of Systems  Constitutional: Negative for appetite change and unexpected weight change.  HENT: Negative for congestion and sinus pressure.   Respiratory: Negative for cough and chest tightness.        Breathing overall stable.  Some days better than other days.    Cardiovascular: Negative for chest pain, palpitations and leg swelling.  Gastrointestinal: Negative for abdominal pain, diarrhea, nausea and vomiting.    Genitourinary: Negative for difficulty urinating and dysuria.  Musculoskeletal: Negative for myalgias and neck pain.  Skin: Negative for color change and rash.  Neurological: Negative for dizziness, light-headedness and headaches.  Psychiatric/Behavioral: Negative for agitation and dysphoric mood.       Objective:    Physical Exam Constitutional:      General: She is not in acute distress.    Appearance: Normal appearance.  HENT:     Head: Normocephalic and atraumatic.     Right Ear: External ear normal.     Left Ear: External ear normal.  Eyes:     General: No scleral  icterus.       Right eye: No discharge.        Left eye: No discharge.     Conjunctiva/sclera: Conjunctivae normal.  Neck:     Thyroid: No thyromegaly.  Cardiovascular:     Rate and Rhythm: Normal rate and regular rhythm.  Pulmonary:     Effort: No respiratory distress.     Breath sounds: Normal breath sounds. No wheezing.  Abdominal:     General: Bowel sounds are normal.     Palpations: Abdomen is soft.     Tenderness: There is no abdominal tenderness.  Musculoskeletal:        General: No swelling or tenderness.     Cervical back: Neck supple. No tenderness.  Lymphadenopathy:     Cervical: No cervical adenopathy.  Skin:    Findings: No erythema or rash.  Neurological:     Mental Status: She is alert.  Psychiatric:        Mood and Affect: Mood normal.        Behavior: Behavior normal.     BP 124/78   Pulse 80   Temp (!) 97.4 F (36.3 C)   Resp 16   Ht _0  (1.676 m)   Wt 163 lb 3.2 oz (74 kg)   SpO2 96%   BMI 26.34 kg/m  Wt Readings from Last 3 Encounters:  10/08/19 163 lb 3.2 oz (74 kg)  02/22/19 175 lb (79.4 kg)  08/08/18 186 lb 3.2 oz (84.5 kg)     Lab Results  Component Value Date   WBC 9.6 10/08/2019   HGB 12.2 10/08/2019   HCT 37.8 10/08/2019   PLT 247.0 10/08/2019   GLUCOSE 88 10/08/2019   CHOL 145 10/08/2019   TRIG 93.0 10/08/2019   HDL 51.90 10/08/2019   LDLCALC 75  10/08/2019   ALT 14 10/08/2019   AST 21 10/08/2019   NA 139 10/08/2019   K 4.9 10/08/2019   CL 103 10/08/2019   CREATININE 1.20 10/08/2019   BUN 30 (H) 10/08/2019   CO2 29 10/08/2019   TSH 1.07 02/22/2019   HGBA1C 6.0 10/08/2019   MICROALBUR 8.8 (H) 07/10/2019    DG Chest 2 View  Result Date: 03/14/2018 CLINICAL DATA:  Short of breath, weakness, low oxygen saturation EXAM: CHEST - 2 VIEW COMPARISON:  Chest x-ray of 01/27/2018 FINDINGS: No active infiltrate or effusion is seen. There is some peribronchial thickening which may indicate bronchitis. Also somewhat prominent interstitial markings are present at the lung bases consistent with fibrotic change. Mediastinal and hilar contours are unremarkable and the heart is within normal limits in size. No acute bony abnormality is seen IMPRESSION: 1. No active lung disease. 2. Changes of bronchitis and probable pulmonary fibrosis at the lung bases. . Electronically Signed   By: Ivar Drape M.D.   On: 03/14/2018 15:40       Assessment & Plan:   Problem List Items Addressed This Visit    Chronic obstructive airway disease with asthma ([place])    Breathing stable. Continue singulair, advair and albuterol prn.        CKD (chronic kidney disease) stage 3, GFR 30-59 ml/min    Avoid antiinflammatories.  Stay hydrated.  Follow metabolic panel.       Diabetes mellitus with peripheral vascular disease (HCC)    Low carb diet and exercise.  On no medication.  Control with diet.  Follow met b and a1c.       Relevant Orders   Hemoglobin  A1c (Completed)   GERD (gastroesophageal reflux disease)    Upper symptoms controlled on protonix.  Follow.        Hypercholesterolemia    On simvastatin.  Low cholesterol diet and exercise.  Follow lipid panel and liver function tests.        Relevant Orders   Hepatic function panel (Completed)   Lipid panel (Completed)   Hypertension    Blood pressure has been under good control.  On no medication.  Follow  pressures.  Follow metabolic panel.       Relevant Orders   CBC with Differential/Platelet (Completed)   Basic metabolic panel (Completed)   Leukocytosis    Follow cbc.       Lower extremity edema    Doing better.  Follow.        Mixed Alzheimer's and vascular dementia ([place])    Saw neurology.  Diagnosed with mixed dementia.  On aricept.  Follow.        Peripheral vascular disease ([place])    Has a history of known varying pressures in each arm.  No pain.  Continue risk factor modification.  On simvastatin.        Vitamin B12 deficiency - Primary   Relevant Orders   Vitamin B12 (Completed)   Weakness    Weakness.  Discussed physical therapy to help with strengthening, gait, etc.  Pt agreed to hone [facility] PT.            Einar Pheasant, MD

## 2019-10-09 LAB — LIPID PANEL
Cholesterol: 145 mg/dL (ref 0–200)
HDL: 51.9 mg/dL (ref >39.00)
LDL Cholesterol: 75 mg/dL (ref 0–99)
NonHDL: 93.38
Total CHOL/HDL Ratio: 3
Triglycerides: 93 mg/dL (ref 0.0–149.0)
VLDL: 18.6 mg/dL (ref 0.0–40.0)

## 2019-10-09 LAB — CBC WITH DIFFERENTIAL/PLATELET
Basophils Absolute: 0.1 10*3/uL (ref 0.0–0.1)
Basophils Relative: 0.8 % (ref 0.0–3.0)
Eosinophils Absolute: 0.2 10*3/uL (ref 0.0–0.7)
Eosinophils Relative: 2.2 % (ref 0.0–5.0)
HCT: 37.8 % (ref 36.0–46.0)
Hemoglobin: 12.2 g/dL (ref 12.0–15.0)
Lymphocytes Relative: 23.3 % (ref 12.0–46.0)
Lymphs Abs: 2.2 10*3/uL (ref 0.7–4.0)
MCHC: 32.4 g/dL (ref 30.0–36.0)
MCV: 96.6 fl (ref 78.0–100.0)
Monocytes Absolute: 0.7 10*3/uL (ref 0.1–1.0)
Monocytes Relative: 7.3 % (ref 3.0–12.0)
Neutro Abs: 6.4 10*3/uL (ref 1.4–7.7)
Neutrophils Relative %: 66.4 % (ref 43.0–77.0)
Platelets: 247 10*3/uL (ref 150.0–400.0)
RBC: 3.91 Mil/uL (ref 3.87–5.11)
RDW: 15.4 % (ref 11.5–15.5)
WBC: 9.6 10*3/uL (ref 4.0–10.5)

## 2019-10-09 LAB — HEPATIC FUNCTION PANEL
ALT: 14 U/L (ref 0–35)
AST: 21 U/L (ref 0–37)
Albumin: 3.5 g/dL (ref 3.5–5.2)
Alkaline Phosphatase: 61 U/L (ref 39–117)
Bilirubin, Direct: 0.1 mg/dL (ref 0.0–0.3)
Total Bilirubin: 0.4 mg/dL (ref 0.2–1.2)
Total Protein: 6 g/dL (ref 6.0–8.3)

## 2019-10-09 LAB — BASIC METABOLIC PANEL
BUN: 30 mg/dL — ABNORMAL HIGH (ref 6–23)
CO2: 29 mEq/L (ref 19–32)
Calcium: 9.2 mg/dL (ref 8.4–10.5)
Chloride: 103 mEq/L (ref 96–112)
Creatinine, Ser: 1.2 mg/dL (ref 0.40–1.20)
GFR: 42.12 mL/min — ABNORMAL LOW (ref >60.00)
Glucose, Bld: 88 mg/dL (ref 70–99)
Potassium: 4.9 mEq/L (ref 3.5–5.1)
Sodium: 139 mEq/L (ref 135–145)

## 2019-10-09 LAB — HEMOGLOBIN A1C: Hgb A1c MFr Bld: 6 % (ref 4.6–6.5)

## 2019-10-10 LAB — VITAMIN B12: Vitamin B-12: 491 pg/mL (ref 211–911)

## 2019-10-11 ENCOUNTER — Encounter: Payer: Self-pay | Admitting: Internal Medicine

## 2019-10-11 ENCOUNTER — Other Ambulatory Visit: Payer: Self-pay | Admitting: Internal Medicine

## 2019-10-18 ENCOUNTER — Other Ambulatory Visit: Payer: Self-pay | Admitting: Internal Medicine

## 2019-10-21 ENCOUNTER — Encounter: Payer: Self-pay | Admitting: Internal Medicine

## 2019-10-21 ENCOUNTER — Telehealth: Payer: Self-pay | Admitting: Internal Medicine

## 2019-10-21 DIAGNOSIS — R531 Weakness: Secondary | ICD-10-CM

## 2019-10-21 DIAGNOSIS — G309 Alzheimer's disease, unspecified: Secondary | ICD-10-CM | POA: Insufficient documentation

## 2019-10-21 NOTE — Assessment & Plan Note (Signed)
Upper symptoms controlled on protonix.  Follow.  

## 2019-10-21 NOTE — Assessment & Plan Note (Signed)
On simvastatin.  Low cholesterol diet and exercise.  Follow lipid panel and liver function tests.   

## 2019-10-21 NOTE — Assessment & Plan Note (Signed)
Has a history of known varying pressures in each arm.  No pain.  Continue risk factor modification.  On simvastatin.

## 2019-10-21 NOTE — Assessment & Plan Note (Signed)
Blood pressure has been under good control.  On no medication.  Follow pressures.  Follow metabolic panel.

## 2019-10-21 NOTE — Assessment & Plan Note (Signed)
Low carb diet and exercise.  On no medication.  Control with diet.  Follow met b and a1c.

## 2019-10-21 NOTE — Assessment & Plan Note (Signed)
Doing better.  Follow.   

## 2019-10-21 NOTE — Assessment & Plan Note (Signed)
Follow cbc.  

## 2019-10-21 NOTE — Assessment & Plan Note (Signed)
Avoid antiinflammatories.  Stay hydrated.  Follow metabolic panel.   

## 2019-10-21 NOTE — Assessment & Plan Note (Signed)
Saw neurology.  Diagnosed with mixed dementia.  On aricept.  Follow.

## 2019-10-21 NOTE — Telephone Encounter (Signed)
My chart message sent to pt for question of PT.

## 2019-10-21 NOTE — Assessment & Plan Note (Signed)
Breathing stable. Continue singulair, advair and albuterol prn.

## 2019-10-22 DIAGNOSIS — R531 Weakness: Secondary | ICD-10-CM | POA: Insufficient documentation

## 2019-10-22 NOTE — Assessment & Plan Note (Signed)
Weakness.  Discussed physical therapy to help with strengthening, gait, etc.  Pt agreed to hone health PT.

## 2019-10-22 NOTE — Addendum Note (Signed)
Addended by: Charm Barges on: 10/22/2019 12:02 AM   Modules accepted: Orders

## 2019-10-22 NOTE — Telephone Encounter (Signed)
Order placed for home health referral for PT.

## 2019-10-28 ENCOUNTER — Other Ambulatory Visit: Payer: Self-pay | Admitting: Internal Medicine

## 2019-10-29 MED ORDER — MONTELUKAST SODIUM 10 MG PO TABS: 10.0000 mg | ORAL_TABLET | Freq: Every day | ORAL | 0 refills | Status: DC

## 2019-10-31 ENCOUNTER — Ambulatory Visit: Payer: Medicare Other

## 2019-11-01 ENCOUNTER — Ambulatory Visit: Payer: Medicare Other

## 2019-11-02 DIAGNOSIS — N1832 Chronic kidney disease, stage 3b: Secondary | ICD-10-CM | POA: Diagnosis not present

## 2019-11-02 DIAGNOSIS — E1122 Type 2 diabetes mellitus with diabetic chronic kidney disease: Secondary | ICD-10-CM | POA: Diagnosis not present

## 2019-11-02 DIAGNOSIS — F0151 Vascular dementia with behavioral disturbance: Secondary | ICD-10-CM | POA: Diagnosis not present

## 2019-11-02 DIAGNOSIS — E039 Hypothyroidism, unspecified: Secondary | ICD-10-CM | POA: Diagnosis not present

## 2019-11-02 DIAGNOSIS — G309 Alzheimer's disease, unspecified: Secondary | ICD-10-CM | POA: Diagnosis not present

## 2019-11-02 DIAGNOSIS — Z9181 History of falling: Secondary | ICD-10-CM | POA: Diagnosis not present

## 2019-11-02 DIAGNOSIS — F329 Major depressive disorder, single episode, unspecified: Secondary | ICD-10-CM | POA: Diagnosis not present

## 2019-11-02 DIAGNOSIS — I129 Hypertensive chronic kidney disease with stage 1 through stage 4 chronic kidney disease, or unspecified chronic kidney disease: Secondary | ICD-10-CM | POA: Diagnosis not present

## 2019-11-02 DIAGNOSIS — K449 Diaphragmatic hernia without obstruction or gangrene: Secondary | ICD-10-CM | POA: Diagnosis not present

## 2019-11-02 DIAGNOSIS — D72829 Elevated white blood cell count, unspecified: Secondary | ICD-10-CM | POA: Diagnosis not present

## 2019-11-02 DIAGNOSIS — E7800 Pure hypercholesterolemia, unspecified: Secondary | ICD-10-CM | POA: Diagnosis not present

## 2019-11-02 DIAGNOSIS — J449 Chronic obstructive pulmonary disease, unspecified: Secondary | ICD-10-CM | POA: Diagnosis not present

## 2019-11-02 DIAGNOSIS — M8580 Other specified disorders of bone density and structure, unspecified site: Secondary | ICD-10-CM | POA: Diagnosis not present

## 2019-11-02 DIAGNOSIS — K219 Gastro-esophageal reflux disease without esophagitis: Secondary | ICD-10-CM | POA: Diagnosis not present

## 2019-11-02 DIAGNOSIS — E1151 Type 2 diabetes mellitus with diabetic peripheral angiopathy without gangrene: Secondary | ICD-10-CM | POA: Diagnosis not present

## 2019-11-02 DIAGNOSIS — Z86010 Personal history of colonic polyps: Secondary | ICD-10-CM | POA: Diagnosis not present

## 2019-11-02 DIAGNOSIS — M109 Gout, unspecified: Secondary | ICD-10-CM | POA: Diagnosis not present

## 2019-11-02 DIAGNOSIS — Z87440 Personal history of urinary (tract) infections: Secondary | ICD-10-CM | POA: Diagnosis not present

## 2019-11-02 DIAGNOSIS — E538 Deficiency of other specified B group vitamins: Secondary | ICD-10-CM | POA: Diagnosis not present

## 2019-11-02 DIAGNOSIS — F0281 Dementia in other diseases classified elsewhere with behavioral disturbance: Secondary | ICD-10-CM | POA: Diagnosis not present

## 2019-11-02 DIAGNOSIS — Z792 Long term (current) use of antibiotics: Secondary | ICD-10-CM | POA: Diagnosis not present

## 2019-11-02 DIAGNOSIS — R600 Localized edema: Secondary | ICD-10-CM | POA: Diagnosis not present

## 2019-11-02 DIAGNOSIS — N3946 Mixed incontinence: Secondary | ICD-10-CM | POA: Diagnosis not present

## 2019-11-02 DIAGNOSIS — G4730 Sleep apnea, unspecified: Secondary | ICD-10-CM | POA: Diagnosis not present

## 2019-11-02 DIAGNOSIS — E559 Vitamin D deficiency, unspecified: Secondary | ICD-10-CM | POA: Diagnosis not present

## 2019-11-04 ENCOUNTER — Other Ambulatory Visit: Payer: Self-pay | Admitting: Internal Medicine

## 2019-11-05 ENCOUNTER — Other Ambulatory Visit: Payer: Self-pay | Admitting: Internal Medicine

## 2019-11-05 MED ORDER — FLUOXETINE HCL 40 MG PO CAPS: 40.0000 mg | ORAL_CAPSULE | Freq: Every day | ORAL | 0 refills | Status: DC

## 2019-11-07 DIAGNOSIS — F0151 Vascular dementia with behavioral disturbance: Secondary | ICD-10-CM | POA: Diagnosis not present

## 2019-11-07 DIAGNOSIS — G309 Alzheimer's disease, unspecified: Secondary | ICD-10-CM | POA: Diagnosis not present

## 2019-11-07 DIAGNOSIS — F0281 Dementia in other diseases classified elsewhere with behavioral disturbance: Secondary | ICD-10-CM | POA: Diagnosis not present

## 2019-11-07 DIAGNOSIS — R600 Localized edema: Secondary | ICD-10-CM | POA: Diagnosis not present

## 2019-11-07 DIAGNOSIS — D72829 Elevated white blood cell count, unspecified: Secondary | ICD-10-CM | POA: Diagnosis not present

## 2019-11-07 DIAGNOSIS — J449 Chronic obstructive pulmonary disease, unspecified: Secondary | ICD-10-CM | POA: Diagnosis not present

## 2019-11-08 ENCOUNTER — Other Ambulatory Visit: Payer: Self-pay

## 2019-11-08 ENCOUNTER — Ambulatory Visit (INDEPENDENT_AMBULATORY_CARE_PROVIDER_SITE_OTHER): Payer: Medicare Other

## 2019-11-08 DIAGNOSIS — E538 Deficiency of other specified B group vitamins: Secondary | ICD-10-CM

## 2019-11-08 MED ORDER — CYANOCOBALAMIN 1000 MCG/ML IJ SOLN
1000.0000 ug | Freq: Once | INTRAMUSCULAR | Status: AC
  Administered 2019-11-08: 1000 ug via INTRAMUSCULAR

## 2019-11-08 NOTE — Progress Notes (Signed)
Patient presented for B 12 injection to left deltoid, patient voiced no concerns nor showed any signs of distress during injection. 

## 2019-11-11 ENCOUNTER — Other Ambulatory Visit: Payer: Self-pay | Admitting: Internal Medicine

## 2019-11-12 MED ORDER — PANTOPRAZOLE SODIUM 40 MG PO TBEC: 40.0000 mg | DELAYED_RELEASE_TABLET | Freq: Every day | ORAL | 0 refills | Status: DC

## 2019-11-16 DIAGNOSIS — M109 Gout, unspecified: Secondary | ICD-10-CM

## 2019-11-16 DIAGNOSIS — Z86010 Personal history of colonic polyps: Secondary | ICD-10-CM

## 2019-11-16 DIAGNOSIS — I129 Hypertensive chronic kidney disease with stage 1 through stage 4 chronic kidney disease, or unspecified chronic kidney disease: Secondary | ICD-10-CM | POA: Diagnosis not present

## 2019-11-16 DIAGNOSIS — E538 Deficiency of other specified B group vitamins: Secondary | ICD-10-CM | POA: Diagnosis not present

## 2019-11-16 DIAGNOSIS — Z792 Long term (current) use of antibiotics: Secondary | ICD-10-CM

## 2019-11-16 DIAGNOSIS — F0151 Vascular dementia with behavioral disturbance: Secondary | ICD-10-CM | POA: Diagnosis not present

## 2019-11-16 DIAGNOSIS — E1151 Type 2 diabetes mellitus with diabetic peripheral angiopathy without gangrene: Secondary | ICD-10-CM | POA: Diagnosis not present

## 2019-11-16 DIAGNOSIS — J449 Chronic obstructive pulmonary disease, unspecified: Secondary | ICD-10-CM | POA: Diagnosis not present

## 2019-11-16 DIAGNOSIS — Z9181 History of falling: Secondary | ICD-10-CM

## 2019-11-16 DIAGNOSIS — K449 Diaphragmatic hernia without obstruction or gangrene: Secondary | ICD-10-CM | POA: Diagnosis not present

## 2019-11-16 DIAGNOSIS — R600 Localized edema: Secondary | ICD-10-CM | POA: Diagnosis not present

## 2019-11-16 DIAGNOSIS — D72829 Elevated white blood cell count, unspecified: Secondary | ICD-10-CM | POA: Diagnosis not present

## 2019-11-16 DIAGNOSIS — Z87440 Personal history of urinary (tract) infections: Secondary | ICD-10-CM

## 2019-11-16 DIAGNOSIS — F0281 Dementia in other diseases classified elsewhere with behavioral disturbance: Secondary | ICD-10-CM

## 2019-11-16 DIAGNOSIS — E039 Hypothyroidism, unspecified: Secondary | ICD-10-CM

## 2019-11-16 DIAGNOSIS — N3946 Mixed incontinence: Secondary | ICD-10-CM

## 2019-11-16 DIAGNOSIS — E7800 Pure hypercholesterolemia, unspecified: Secondary | ICD-10-CM

## 2019-11-16 DIAGNOSIS — M8580 Other specified disorders of bone density and structure, unspecified site: Secondary | ICD-10-CM

## 2019-11-16 DIAGNOSIS — N1832 Chronic kidney disease, stage 3b: Secondary | ICD-10-CM | POA: Diagnosis not present

## 2019-11-16 DIAGNOSIS — G4730 Sleep apnea, unspecified: Secondary | ICD-10-CM

## 2019-11-16 DIAGNOSIS — G309 Alzheimer's disease, unspecified: Secondary | ICD-10-CM | POA: Diagnosis not present

## 2019-11-16 DIAGNOSIS — E559 Vitamin D deficiency, unspecified: Secondary | ICD-10-CM

## 2019-11-16 DIAGNOSIS — E1122 Type 2 diabetes mellitus with diabetic chronic kidney disease: Secondary | ICD-10-CM | POA: Diagnosis not present

## 2019-11-16 DIAGNOSIS — F329 Major depressive disorder, single episode, unspecified: Secondary | ICD-10-CM

## 2019-11-16 DIAGNOSIS — K219 Gastro-esophageal reflux disease without esophagitis: Secondary | ICD-10-CM

## 2019-11-21 DIAGNOSIS — F0281 Dementia in other diseases classified elsewhere with behavioral disturbance: Secondary | ICD-10-CM | POA: Diagnosis not present

## 2019-11-21 DIAGNOSIS — J449 Chronic obstructive pulmonary disease, unspecified: Secondary | ICD-10-CM | POA: Diagnosis not present

## 2019-11-21 DIAGNOSIS — D72829 Elevated white blood cell count, unspecified: Secondary | ICD-10-CM | POA: Diagnosis not present

## 2019-11-21 DIAGNOSIS — G309 Alzheimer's disease, unspecified: Secondary | ICD-10-CM | POA: Diagnosis not present

## 2019-11-21 DIAGNOSIS — R600 Localized edema: Secondary | ICD-10-CM | POA: Diagnosis not present

## 2019-11-21 DIAGNOSIS — F0151 Vascular dementia with behavioral disturbance: Secondary | ICD-10-CM | POA: Diagnosis not present

## 2019-11-28 ENCOUNTER — Other Ambulatory Visit: Payer: Self-pay | Admitting: Internal Medicine

## 2019-11-29 DIAGNOSIS — G309 Alzheimer's disease, unspecified: Secondary | ICD-10-CM | POA: Diagnosis not present

## 2019-11-29 DIAGNOSIS — F0281 Dementia in other diseases classified elsewhere with behavioral disturbance: Secondary | ICD-10-CM | POA: Diagnosis not present

## 2019-11-29 DIAGNOSIS — R600 Localized edema: Secondary | ICD-10-CM | POA: Diagnosis not present

## 2019-11-29 DIAGNOSIS — J449 Chronic obstructive pulmonary disease, unspecified: Secondary | ICD-10-CM | POA: Diagnosis not present

## 2019-11-29 DIAGNOSIS — D72829 Elevated white blood cell count, unspecified: Secondary | ICD-10-CM | POA: Diagnosis not present

## 2019-11-29 DIAGNOSIS — F0151 Vascular dementia with behavioral disturbance: Secondary | ICD-10-CM | POA: Diagnosis not present

## 2019-12-02 DIAGNOSIS — Z87440 Personal history of urinary (tract) infections: Secondary | ICD-10-CM | POA: Diagnosis not present

## 2019-12-02 DIAGNOSIS — J449 Chronic obstructive pulmonary disease, unspecified: Secondary | ICD-10-CM | POA: Diagnosis not present

## 2019-12-02 DIAGNOSIS — R600 Localized edema: Secondary | ICD-10-CM | POA: Diagnosis not present

## 2019-12-02 DIAGNOSIS — F329 Major depressive disorder, single episode, unspecified: Secondary | ICD-10-CM | POA: Diagnosis not present

## 2019-12-02 DIAGNOSIS — Z9181 History of falling: Secondary | ICD-10-CM | POA: Diagnosis not present

## 2019-12-02 DIAGNOSIS — G309 Alzheimer's disease, unspecified: Secondary | ICD-10-CM | POA: Diagnosis not present

## 2019-12-02 DIAGNOSIS — E538 Deficiency of other specified B group vitamins: Secondary | ICD-10-CM | POA: Diagnosis not present

## 2019-12-02 DIAGNOSIS — E039 Hypothyroidism, unspecified: Secondary | ICD-10-CM | POA: Diagnosis not present

## 2019-12-02 DIAGNOSIS — G4730 Sleep apnea, unspecified: Secondary | ICD-10-CM | POA: Diagnosis not present

## 2019-12-02 DIAGNOSIS — E7800 Pure hypercholesterolemia, unspecified: Secondary | ICD-10-CM | POA: Diagnosis not present

## 2019-12-02 DIAGNOSIS — K219 Gastro-esophageal reflux disease without esophagitis: Secondary | ICD-10-CM | POA: Diagnosis not present

## 2019-12-02 DIAGNOSIS — F0151 Vascular dementia with behavioral disturbance: Secondary | ICD-10-CM | POA: Diagnosis not present

## 2019-12-02 DIAGNOSIS — M8580 Other specified disorders of bone density and structure, unspecified site: Secondary | ICD-10-CM | POA: Diagnosis not present

## 2019-12-02 DIAGNOSIS — E1151 Type 2 diabetes mellitus with diabetic peripheral angiopathy without gangrene: Secondary | ICD-10-CM | POA: Diagnosis not present

## 2019-12-02 DIAGNOSIS — F0281 Dementia in other diseases classified elsewhere with behavioral disturbance: Secondary | ICD-10-CM | POA: Diagnosis not present

## 2019-12-02 DIAGNOSIS — I129 Hypertensive chronic kidney disease with stage 1 through stage 4 chronic kidney disease, or unspecified chronic kidney disease: Secondary | ICD-10-CM | POA: Diagnosis not present

## 2019-12-02 DIAGNOSIS — N3946 Mixed incontinence: Secondary | ICD-10-CM | POA: Diagnosis not present

## 2019-12-02 DIAGNOSIS — K449 Diaphragmatic hernia without obstruction or gangrene: Secondary | ICD-10-CM | POA: Diagnosis not present

## 2019-12-02 DIAGNOSIS — E559 Vitamin D deficiency, unspecified: Secondary | ICD-10-CM | POA: Diagnosis not present

## 2019-12-02 DIAGNOSIS — D72829 Elevated white blood cell count, unspecified: Secondary | ICD-10-CM | POA: Diagnosis not present

## 2019-12-02 DIAGNOSIS — N1832 Chronic kidney disease, stage 3b: Secondary | ICD-10-CM | POA: Diagnosis not present

## 2019-12-02 DIAGNOSIS — Z86010 Personal history of colonic polyps: Secondary | ICD-10-CM | POA: Diagnosis not present

## 2019-12-02 DIAGNOSIS — E1122 Type 2 diabetes mellitus with diabetic chronic kidney disease: Secondary | ICD-10-CM | POA: Diagnosis not present

## 2019-12-02 DIAGNOSIS — Z792 Long term (current) use of antibiotics: Secondary | ICD-10-CM | POA: Diagnosis not present

## 2019-12-02 DIAGNOSIS — M109 Gout, unspecified: Secondary | ICD-10-CM | POA: Diagnosis not present

## 2019-12-04 ENCOUNTER — Other Ambulatory Visit: Payer: Self-pay | Admitting: Internal Medicine

## 2019-12-04 MED ORDER — FLUTICASONE-SALMETEROL 500-50 MCG/DOSE IN AEPB: INHALATION_SPRAY | RESPIRATORY_TRACT | 0 refills | Status: DC

## 2019-12-04 MED ORDER — FLUOXETINE HCL 40 MG PO CAPS: 40.0000 mg | ORAL_CAPSULE | Freq: Every day | ORAL | 0 refills | Status: DC

## 2019-12-05 DIAGNOSIS — F0151 Vascular dementia with behavioral disturbance: Secondary | ICD-10-CM | POA: Diagnosis not present

## 2019-12-05 DIAGNOSIS — R600 Localized edema: Secondary | ICD-10-CM | POA: Diagnosis not present

## 2019-12-05 DIAGNOSIS — F0281 Dementia in other diseases classified elsewhere with behavioral disturbance: Secondary | ICD-10-CM | POA: Diagnosis not present

## 2019-12-05 DIAGNOSIS — D72829 Elevated white blood cell count, unspecified: Secondary | ICD-10-CM | POA: Diagnosis not present

## 2019-12-05 DIAGNOSIS — G309 Alzheimer's disease, unspecified: Secondary | ICD-10-CM | POA: Diagnosis not present

## 2019-12-05 DIAGNOSIS — J449 Chronic obstructive pulmonary disease, unspecified: Secondary | ICD-10-CM | POA: Diagnosis not present

## 2019-12-11 ENCOUNTER — Ambulatory Visit: Payer: Medicare Other

## 2019-12-12 ENCOUNTER — Other Ambulatory Visit: Payer: Self-pay | Admitting: Internal Medicine

## 2019-12-12 DIAGNOSIS — F0281 Dementia in other diseases classified elsewhere with behavioral disturbance: Secondary | ICD-10-CM | POA: Diagnosis not present

## 2019-12-12 DIAGNOSIS — F0151 Vascular dementia with behavioral disturbance: Secondary | ICD-10-CM | POA: Diagnosis not present

## 2019-12-12 DIAGNOSIS — J449 Chronic obstructive pulmonary disease, unspecified: Secondary | ICD-10-CM | POA: Diagnosis not present

## 2019-12-12 DIAGNOSIS — D72829 Elevated white blood cell count, unspecified: Secondary | ICD-10-CM | POA: Diagnosis not present

## 2019-12-12 DIAGNOSIS — R600 Localized edema: Secondary | ICD-10-CM | POA: Diagnosis not present

## 2019-12-12 DIAGNOSIS — G309 Alzheimer's disease, unspecified: Secondary | ICD-10-CM | POA: Diagnosis not present

## 2019-12-12 MED ORDER — PANTOPRAZOLE SODIUM 40 MG PO TBEC: 40.0000 mg | DELAYED_RELEASE_TABLET | Freq: Every day | ORAL | 0 refills | Status: DC

## 2019-12-13 ENCOUNTER — Other Ambulatory Visit: Payer: Self-pay | Admitting: Internal Medicine

## 2019-12-13 ENCOUNTER — Ambulatory Visit: Payer: Medicare Other

## 2019-12-18 DIAGNOSIS — F0151 Vascular dementia with behavioral disturbance: Secondary | ICD-10-CM | POA: Diagnosis not present

## 2019-12-18 DIAGNOSIS — R600 Localized edema: Secondary | ICD-10-CM | POA: Diagnosis not present

## 2019-12-18 DIAGNOSIS — J449 Chronic obstructive pulmonary disease, unspecified: Secondary | ICD-10-CM | POA: Diagnosis not present

## 2019-12-18 DIAGNOSIS — D72829 Elevated white blood cell count, unspecified: Secondary | ICD-10-CM | POA: Diagnosis not present

## 2019-12-18 DIAGNOSIS — F0281 Dementia in other diseases classified elsewhere with behavioral disturbance: Secondary | ICD-10-CM | POA: Diagnosis not present

## 2019-12-18 DIAGNOSIS — G309 Alzheimer's disease, unspecified: Secondary | ICD-10-CM | POA: Diagnosis not present

## 2019-12-23 ENCOUNTER — Other Ambulatory Visit: Payer: Self-pay | Admitting: Internal Medicine

## 2019-12-24 MED ORDER — LEVOTHYROXINE SODIUM 50 MCG PO TABS: 50.0000 ug | ORAL_TABLET | Freq: Every day | ORAL | 0 refills | Status: DC

## 2019-12-24 MED ORDER — ALLOPURINOL 100 MG PO TABS: ORAL_TABLET | ORAL | 0 refills | Status: DC

## 2019-12-25 ENCOUNTER — Other Ambulatory Visit: Payer: Self-pay

## 2019-12-25 ENCOUNTER — Ambulatory Visit (INDEPENDENT_AMBULATORY_CARE_PROVIDER_SITE_OTHER): Payer: Medicare Other

## 2019-12-25 DIAGNOSIS — E538 Deficiency of other specified B group vitamins: Secondary | ICD-10-CM | POA: Diagnosis not present

## 2019-12-25 MED ORDER — CYANOCOBALAMIN 1000 MCG/ML IJ SOLN
1000.0000 ug | Freq: Once | INTRAMUSCULAR | Status: AC
  Administered 2019-12-25: 1000 ug via INTRAMUSCULAR

## 2019-12-25 NOTE — Progress Notes (Addendum)
Patient presented for B 12 injection to left deltoid, patient voiced no concerns nor showed any signs of distress during injection.  Reviewed.  Dr Scott 

## 2020-01-03 ENCOUNTER — Other Ambulatory Visit: Payer: Self-pay | Admitting: Internal Medicine

## 2020-01-03 MED ORDER — FLUOXETINE HCL 40 MG PO CAPS: 40.0000 mg | ORAL_CAPSULE | Freq: Every day | ORAL | 0 refills | Status: DC

## 2020-01-03 MED ORDER — MONTELUKAST SODIUM 10 MG PO TABS: 10.0000 mg | ORAL_TABLET | Freq: Every day | ORAL | 0 refills | Status: DC

## 2020-01-04 ENCOUNTER — Other Ambulatory Visit: Payer: Self-pay | Admitting: Internal Medicine

## 2020-01-05 ENCOUNTER — Other Ambulatory Visit: Payer: Self-pay | Admitting: Internal Medicine

## 2020-01-07 MED ORDER — FLUTICASONE-SALMETEROL 500-50 MCG/DOSE IN AEPB: INHALATION_SPRAY | RESPIRATORY_TRACT | 0 refills | Status: DC

## 2020-01-10 ENCOUNTER — Encounter: Payer: Self-pay | Admitting: Internal Medicine

## 2020-01-10 ENCOUNTER — Ambulatory Visit (INDEPENDENT_AMBULATORY_CARE_PROVIDER_SITE_OTHER): Payer: Medicare Other | Admitting: Internal Medicine

## 2020-01-10 ENCOUNTER — Other Ambulatory Visit: Payer: Self-pay

## 2020-01-10 DIAGNOSIS — I10 Essential (primary) hypertension: Secondary | ICD-10-CM

## 2020-01-10 DIAGNOSIS — Z87440 Personal history of urinary (tract) infections: Secondary | ICD-10-CM | POA: Diagnosis not present

## 2020-01-10 DIAGNOSIS — G309 Alzheimer's disease, unspecified: Secondary | ICD-10-CM | POA: Diagnosis not present

## 2020-01-10 DIAGNOSIS — J449 Chronic obstructive pulmonary disease, unspecified: Secondary | ICD-10-CM | POA: Diagnosis not present

## 2020-01-10 DIAGNOSIS — K219 Gastro-esophageal reflux disease without esophagitis: Secondary | ICD-10-CM

## 2020-01-10 DIAGNOSIS — E7800 Pure hypercholesterolemia, unspecified: Secondary | ICD-10-CM | POA: Diagnosis not present

## 2020-01-10 DIAGNOSIS — D72829 Elevated white blood cell count, unspecified: Secondary | ICD-10-CM | POA: Diagnosis not present

## 2020-01-10 DIAGNOSIS — R059 Cough, unspecified: Secondary | ICD-10-CM

## 2020-01-10 DIAGNOSIS — F0280 Dementia in other diseases classified elsewhere without behavioral disturbance: Secondary | ICD-10-CM

## 2020-01-10 DIAGNOSIS — R634 Abnormal weight loss: Secondary | ICD-10-CM

## 2020-01-10 DIAGNOSIS — E1151 Type 2 diabetes mellitus with diabetic peripheral angiopathy without gangrene: Secondary | ICD-10-CM

## 2020-01-10 DIAGNOSIS — N183 Chronic kidney disease, stage 3 unspecified: Secondary | ICD-10-CM

## 2020-01-10 DIAGNOSIS — R531 Weakness: Secondary | ICD-10-CM | POA: Diagnosis not present

## 2020-01-10 DIAGNOSIS — R05 Cough: Secondary | ICD-10-CM

## 2020-01-10 DIAGNOSIS — F0150 Vascular dementia without behavioral disturbance: Secondary | ICD-10-CM

## 2020-01-10 DIAGNOSIS — J4489 Other specified chronic obstructive pulmonary disease: Secondary | ICD-10-CM

## 2020-01-10 NOTE — Progress Notes (Signed)
Patient ID: LYNIX BONINE, female   DOB: May 27, 1926, 86 y.o.   MRN: [id number]   Subjective:    Patient ID: [name], female    DOB: Dec 01, 1926, 85 y.o.   MRN: [id number]  HPI This visit occurred during the SARS-CoV-2 public health emergency.  Safety protocols were in place, including screening questions prior to the visit, additional usage of staff PPE, and extensive cleaning of exam room while observing appropriate contact time as indicated for disinfecting solutions.  Patient here for a scheduled follow up.  She is accompanied by her daughter.  History obtained from both of them.  She is eating less.  Continues to lose weight.  Has days where not as interactive.  Interest - declining.  She is sleepy today during exam.  Will arouse and answer questions.  Breathing overall appears to be stable - with some days better than others.  No chest pain or abdominal pain reported.  Taking aricept.  Discussed palliative care - daughter interested.    Past Medical History:  Diagnosis Date  . Asthma   . Chronic bronchitis ([place])   . COPD (chronic obstructive pulmonary disease) (H[facility])    uses inhalers  . Depression   . Diabetes mellitus ([place])   . GERD (gastroesophageal reflux disease)    hiatal hernia  . Gout   . Hematuria    followed by urology  . History of colon polyps   . History of frequent urinary tract infections   . Humeral fracture 9/09   comminuted impacted proximal  . Hypercholesterolemia   . Hypertension   . Hypothyroidism   . Peripheral vascular disease ([place])   . Sleep apnea    does not use cpap as it did not benefit her   Past Surgical History:  Procedure Laterality Date  . BLADDER SUSPENSION  12/08/99  . BREAST BIOPSY Right 1960's  . CARPAL TUNNEL RELEASE Right 03/17/2017   Procedure: CARPAL TUNNEL RELEASE;  Surgeon: Hessie Knows, MD;  Location: [facility] ORS;  Service: Orthopedics;  Laterality: Right;  . CHOLECYSTECTOMY  1994  . COLONOSCOPY W/ POLYPECTOMY    . EYE SURGERY  Bilateral    cataract extractions  . KNEE SURGERY Right 2013   arthroscopy  . ORIF WRIST FRACTURE Right 03/17/2017   Procedure: OPEN REDUCTION INTERNAL FIXATION (ORIF) WRIST FRACTURE;  Surgeon: Hessie Knows, MD;  Location: [facility] ORS;  Service: Orthopedics;  Laterality: Right;   Family History  Problem Relation Age of Onset  . Hypertension Father   . CVA Father   . CAD Father   . Hypertension Mother   . Colitis Mother   . CAD Mother   . Breast cancer Neg Hx   . Colon cancer Neg Hx    Social History   Socioeconomic History  . Marital status: Widowed    Spouse name: Not on file  . Number of children: 2  . Years of education: Not on file  . Highest education level: Not on file  Occupational History  . Not on file  Tobacco Use  . Smoking status: Never Smoker  . Smokeless tobacco: Never Used  Vaping Use  . Vaping Use: Never used  Substance and Sexual Activity  . Alcohol use: No    Alcohol/week: 0.0 standard drinks  . Drug use: No  . Sexual activity: Never  Other Topics Concern  . Not on file  Social History Narrative  . Not on file   Social Determinants of Health   Financial Resource Strain:   .  Difficulty of Paying Living Expenses: Not on file  Food Insecurity:   . Worried About Charity fundraiser in the Last Year: Not on file  . Ran Out of Food in the Last Year: Not on file  Transportation Needs:   . Lack of Transportation (Medical): Not on file  . Lack of Transportation (Non-Medical): Not on file  Physical Activity:   . Days of Exercise per Week: Not on file  . Minutes of Exercise per Session: Not on file  Stress:   . Feeling of Stress : Not on file  Social Connections:   . Frequency of Communication with Friends and Family: Not on file  . Frequency of Social Gatherings with Friends and Family: Not on file  . Attends Religious Services: Not on file  . Active Member of Clubs or Organizations: Not on file  . Attends Archivist Meetings: Not on file   . Marital Status: Not on file    Outpatient Encounter Medications as of 01/10/2020  Medication Sig  . albuterol (VENTOLIN HFA) 108 (90 Base) MCG/ACT inhaler Inhale 2 puffs into the lungs every 6 (six) hours as needed for wheezi ng or shortness of breath.  . allopurinol (ZYLOPRIM) 100 MG tablet One tab PO daily  . Calcium Carbonate-Vitamin D (CALCIUM 600+D) 600-400 MG-UNIT per tablet Take 1 tablet by mouth daily.   . cetirizine (ZYRTEC) 10 MG tablet Take 10 mg by mouth daily.  . Cholecalciferol (VITAMIN D-3) 1000 UNITS CAPS Take 1 capsule by mouth daily.  . Cranberry-Cholecalciferol 4200-500 MG-UNIT CAPS Take 1 capsule by mouth daily.   Marland Kitchen FLUoxetine (PROZAC) 40 MG capsule Take 1 capsule (40 mg total) by mouth daily.  . Fluticasone-Salmeterol (ADVAIR DISKUS) 500-50 MCG/DOSE AEPB Inhale 1 puff into the lungs 2 (two) times daily.  Marland Kitchen levothyroxine (SYNTHROID) 50 MCG tablet Take 1 tablet (50 mcg total) by mouth daily before breakfast.  . methenamine (HIPREX) 1 g tablet Take 1 tablet (1 g total) by mouth daily.  . montelukast (SINGULAIR) 10 MG tablet Take 1 tablet (10 mg total) by mouth at bedtime.  Marland Kitchen nystatin (MYCOSTATIN/NYSTOP) powder Apply topically 2 (two) times daily.  Marland Kitchen nystatin cream (MYCOSTATIN) Apply 1 application topically 2 (two) times daily.  . pantoprazole (PROTONIX) 40 MG tablet Take 1 tablet (40 mg total) by mouth daily.  . simvastatin (ZOCOR) 20 MG tablet Take 1 tablet (20 mg total) by mouth daily.  Marland Kitchen tolterodine (DETROL) 2 MG tablet Take 1 tablet (2 mg total) by mouth 2 (two) times daily.   No facility-administered encounter medications on file as of 01/10/2020.    Review of Systems  Constitutional: Positive for fatigue.       Eating less.  Losing weight.   HENT: Negative for congestion and sinus pressure.   Respiratory: Negative for chest tightness.        Breathing overall stable.    Cardiovascular: Negative for chest pain, palpitations and leg swelling.  Gastrointestinal:  Negative for abdominal pain, diarrhea, nausea and vomiting.  Genitourinary: Negative for difficulty urinating and dysuria.  Musculoskeletal: Negative for joint swelling and myalgias.  Skin: Negative for color change and rash.  Neurological: Negative for dizziness, light-headedness and headaches.  Psychiatric/Behavioral: Negative for agitation and dysphoric mood.       Objective:    Physical Exam Vitals reviewed.  Constitutional:      General: She is not in acute distress.    Appearance: Normal appearance.  HENT:     Head: Normocephalic and atraumatic.  Right Ear: External ear normal.     Left Ear: External ear normal.  Eyes:     General: No scleral icterus.       Right eye: No discharge.        Left eye: No discharge.     Conjunctiva/sclera: Conjunctivae normal.  Neck:     Thyroid: No thyromegaly.  Cardiovascular:     Rate and Rhythm: Normal rate and regular rhythm.  Pulmonary:     Effort: No respiratory distress.     Breath sounds: Normal breath sounds.  Abdominal:     General: Bowel sounds are normal.     Palpations: Abdomen is soft.     Tenderness: There is no abdominal tenderness.  Musculoskeletal:        General: No swelling or tenderness.     Cervical back: Neck supple. No tenderness.  Lymphadenopathy:     Cervical: No cervical adenopathy.  Skin:    Findings: No erythema or rash.  Neurological:     Mental Status: She is alert.  Psychiatric:        Mood and Affect: Mood normal.        Behavior: Behavior normal.     BP 124/78   Pulse 88   Temp 97.6 F (36.4 C)   Resp 16   Ht '5\' 6"'  (1.676 m)   Wt 156 lb (70.8 kg)   SpO2 96%   BMI 25.18 kg/m  Wt Readings from Last 3 Encounters:  01/10/20 156 lb (70.8 kg)  10/08/19 163 lb 3.2 oz (74 kg)  02/22/19 175 lb (79.4 kg)     Lab Results  Component Value Date   WBC 9.6 10/08/2019   HGB 12.2 10/08/2019   HCT 37.8 10/08/2019   PLT 247.0 10/08/2019   GLUCOSE 110 (H) 01/10/2020   CHOL 128 01/10/2020     TRIG 106.0 01/10/2020   HDL 42.90 01/10/2020   LDLCALC 64 01/10/2020   ALT 14 01/10/2020   AST 25 01/10/2020   NA 139 01/10/2020   K 4.7 01/10/2020   CL 103 01/10/2020   CREATININE 1.22 (H) 01/10/2020   BUN 23 01/10/2020   CO2 26 01/10/2020   TSH 1.86 01/10/2020   HGBA1C 6.4 01/10/2020   MICROALBUR 8.8 (H) 07/10/2019    DG Chest 2 View  Result Date: 03/14/2018 CLINICAL DATA:  Short of breath, weakness, low oxygen saturation EXAM: CHEST - 2 VIEW COMPARISON:  Chest x-ray of 01/27/2018 FINDINGS: No active infiltrate or effusion is seen. There is some peribronchial thickening which may indicate bronchitis. Also somewhat prominent interstitial markings are present at the lung bases consistent with fibrotic change. Mediastinal and hilar contours are unremarkable and the heart is within normal limits in size. No acute bony abnormality is seen IMPRESSION: 1. No active lung disease. 2. Changes of bronchitis and probable pulmonary fibrosis at the lung bases. . Electronically Signed   By: Ivar Drape M.D.   On: 03/14/2018 15:40       Assessment & Plan:   Problem List Items Addressed This Visit    Weight loss    Encourage increased po intake.  Follow.       Relevant Orders   Amb Referral to Palliative Care   Weakness    Weakness and increased somnolence at times.  Losing weight.  Discussed palliative care.  Daughter interested.        Relevant Orders   Amb Referral to Palliative Care   Mixed Alzheimer's and vascular dementia ([place])  Saw neurology.  On aricept.  Keep f/u with neurology.  Follow.       Relevant Orders   Amb Referral to Palliative Care   Leukocytosis   Hypertension    Blood pressure doing well.  On no medication.  Follow.       Relevant Orders   TSH (Completed)   Basic metabolic panel (Completed)   Hypercholesterolemia    On simvastatin.  Follow lipid panel and liver function tests.        Relevant Orders   Hepatic function panel (Completed)   Lipid  panel (Completed)   History of frequent urinary tract infections    Has previously been evaluated by urology.  Stable.  Follow.        GERD (gastroesophageal reflux disease)    No upper symptoms reported.  On protonix.        Diabetes mellitus with peripheral vascular disease ([place])    Has lost weight.  Follow met b and a1c.        Relevant Orders   Hemoglobin A1c (Completed)   Cough    Overall stable.  Breathing overall stable.  Continue inhalers and singulair.        CKD (chronic kidney disease) stage 3, GFR 30-59 ml/min    Avoid antiinflammatories.  Stay hydrated.  Follow metabolic panel.       Chronic obstructive airway disease with asthma ([facility])    Breathing stable.  Has some days better than others, but overall relatively stable.  Continue inhalers and singulair.        Relevant Orders   Amb Referral to Palliative Care       Einar Pheasant, MD

## 2020-01-11 LAB — HEPATIC FUNCTION PANEL
ALT: 14 U/L (ref 0–35)
AST: 25 U/L (ref 0–37)
Albumin: 3.2 g/dL — ABNORMAL LOW (ref 3.5–5.2)
Alkaline Phosphatase: 76 U/L (ref 39–117)
Bilirubin, Direct: 0.1 mg/dL (ref 0.0–0.3)
Total Bilirubin: 0.3 mg/dL (ref 0.2–1.2)
Total Protein: 6 g/dL (ref 6.0–8.3)

## 2020-01-11 LAB — BASIC METABOLIC PANEL
BUN: 23 mg/dL (ref 6–23)
CO2: 26 mEq/L (ref 19–32)
Calcium: 9.6 mg/dL (ref 8.4–10.5)
Chloride: 103 mEq/L (ref 96–112)
Creatinine, Ser: 1.22 mg/dL — ABNORMAL HIGH (ref 0.40–1.20)
GFR: 41.3 mL/min — ABNORMAL LOW (ref >60.00)
Glucose, Bld: 110 mg/dL — ABNORMAL HIGH (ref 70–99)
Potassium: 4.7 mEq/L (ref 3.5–5.1)
Sodium: 139 mEq/L (ref 135–145)

## 2020-01-11 LAB — TSH: TSH: 1.86 u[IU]/mL (ref 0.35–4.50)

## 2020-01-11 LAB — LIPID PANEL
Cholesterol: 128 mg/dL (ref 0–200)
HDL: 42.9 mg/dL (ref >39.00)
LDL Cholesterol: 64 mg/dL (ref 0–99)
NonHDL: 85.46
Total CHOL/HDL Ratio: 3
Triglycerides: 106 mg/dL (ref 0.0–149.0)
VLDL: 21.2 mg/dL (ref 0.0–40.0)

## 2020-01-11 LAB — HEMOGLOBIN A1C: Hgb A1c MFr Bld: 6.4 % (ref 4.6–6.5)

## 2020-01-14 DIAGNOSIS — F0150 Vascular dementia without behavioral disturbance: Secondary | ICD-10-CM | POA: Diagnosis not present

## 2020-01-14 DIAGNOSIS — H9193 Unspecified hearing loss, bilateral: Secondary | ICD-10-CM | POA: Diagnosis not present

## 2020-01-14 DIAGNOSIS — G309 Alzheimer's disease, unspecified: Secondary | ICD-10-CM | POA: Diagnosis not present

## 2020-01-14 DIAGNOSIS — R259 Unspecified abnormal involuntary movements: Secondary | ICD-10-CM | POA: Diagnosis not present

## 2020-01-14 DIAGNOSIS — F0280 Dementia in other diseases classified elsewhere without behavioral disturbance: Secondary | ICD-10-CM | POA: Diagnosis not present

## 2020-01-20 ENCOUNTER — Encounter: Payer: Self-pay | Admitting: Internal Medicine

## 2020-01-20 DIAGNOSIS — R634 Abnormal weight loss: Secondary | ICD-10-CM | POA: Insufficient documentation

## 2020-01-20 NOTE — Assessment & Plan Note (Signed)
On simvastatin.  Follow lipid panel and liver function tests.   

## 2020-01-20 NOTE — Assessment & Plan Note (Signed)
Has previously been evaluated by urology.  Stable.  Follow.

## 2020-01-20 NOTE — Assessment & Plan Note (Signed)
Blood pressure doing well.  On no medication.  Follow.  

## 2020-01-20 NOTE — Assessment & Plan Note (Signed)
Breathing stable.  Has some days better than others, but overall relatively stable.  Continue inhalers and singulair.

## 2020-01-20 NOTE — Assessment & Plan Note (Signed)
Encourage increased po intake.  Follow.   

## 2020-01-20 NOTE — Assessment & Plan Note (Signed)
Avoid antiinflammatories.  Stay hydrated.  Follow metabolic panel.   

## 2020-01-20 NOTE — Assessment & Plan Note (Signed)
Saw neurology.  On aricept.  Keep f/u with neurology.  Follow.

## 2020-01-20 NOTE — Assessment & Plan Note (Signed)
Weakness and increased somnolence at times.  Losing weight.  Discussed palliative care.  Daughter interested.

## 2020-01-20 NOTE — Assessment & Plan Note (Signed)
Has lost weight.  Follow met b and a1c.   

## 2020-01-20 NOTE — Assessment & Plan Note (Signed)
Overall stable.  Breathing overall stable.  Continue inhalers and singulair.

## 2020-01-20 NOTE — Assessment & Plan Note (Signed)
No upper symptoms reported.  On protonix.   

## 2020-01-22 ENCOUNTER — Telehealth: Payer: Self-pay

## 2020-01-22 NOTE — Telephone Encounter (Signed)
SW LVM for patients daughter, Raynelle Fanning, to schedule initial visit. Awaiting return call.

## 2020-01-23 ENCOUNTER — Other Ambulatory Visit: Payer: Self-pay | Admitting: Internal Medicine

## 2020-01-23 ENCOUNTER — Telehealth: Payer: Self-pay

## 2020-01-23 MED ORDER — LEVOTHYROXINE SODIUM 50 MCG PO TABS: 50.0000 ug | ORAL_TABLET | Freq: Every day | ORAL | 0 refills | Status: DC

## 2020-01-23 MED ORDER — ALLOPURINOL 100 MG PO TABS: ORAL_TABLET | ORAL | 0 refills | Status: DC

## 2020-01-23 NOTE — Telephone Encounter (Signed)
Patients daughter, Raynelle Fanning, returned SW call to schedule initial in home visit for 01-30-2020 @2pm .

## 2020-01-29 ENCOUNTER — Ambulatory Visit (INDEPENDENT_AMBULATORY_CARE_PROVIDER_SITE_OTHER): Payer: Medicare Other

## 2020-01-29 ENCOUNTER — Other Ambulatory Visit: Payer: Self-pay

## 2020-01-29 DIAGNOSIS — E538 Deficiency of other specified B group vitamins: Secondary | ICD-10-CM

## 2020-01-29 DIAGNOSIS — Z23 Encounter for immunization: Secondary | ICD-10-CM

## 2020-01-29 MED ORDER — CYANOCOBALAMIN 1000 MCG/ML IJ SOLN
1000.0000 ug | Freq: Once | INTRAMUSCULAR | Status: AC
  Administered 2020-01-29: 1000 ug via INTRAMUSCULAR

## 2020-01-29 NOTE — Progress Notes (Addendum)
Patient presented for B 12 injection to right deltoid, patient voiced no concerns nor showed any signs of distress during injection.  Reviewed.  Dr Scott 

## 2020-01-30 ENCOUNTER — Other Ambulatory Visit: Payer: Self-pay

## 2020-01-30 DIAGNOSIS — Z515 Encounter for palliative care: Secondary | ICD-10-CM

## 2020-01-30 NOTE — Progress Notes (Signed)
COMMUNITY PALLIATIVE CARE SW NOTE  PATIENT NAME: Diamond Newman DOB: 1926-08-03 MRN: [id number]  PRIMARY CARE PROVIDER: Einar Pheasant, MD  RESPONSIBLE PARTY:  Acct ID - Guarantor Home Phone Work Phone Relationship Acct Type  [id number] NITARA, SZCZERBA [phone]  Self P/F     [address] [place], [place] [address]     PLAN OF CARE and INTERVENTIONS:             1. GOALS OF CARE/ ADVANCE CARE PLANNING:  Patient is currently a Full code. SW left MOST form with patient and daughter to review and family, will review at next visit. Daughter is HCPOA. Patients goal is to remain at home. 2. SOCIAL/EMOTIONAL/SPIRITUAL ASSESSMENT/ INTERVENTIONS:  SW and RN Almyra Free met with patient and patients daughter, Diamond Newman, caregiver Addie from [place] present as well. Patient lives in one story with son, who works during the day. Patient suffers from COPD and new early onset Alzheimer's. Daughter shared that patient does not eat a lot and does drink ensure. Patient was not sleeping well at night on Aricept, they changed the does time to the morning and seems to be working well so far. Patient is mobile with RW and SBA from caregiver, however patient tends to give out of breath and strength most times. Patient is not on O2 but does utilize inhalers and has hx of bronchitis.Patients breathing sounded raspy during visit.RN checked O2 stats at 92% on RA. Daughter and caregiver shared that patient does not do much during the day besides sleep and is encourgaed to eat. patient sees PCP every 3-4 months and has an Therapist, sports from [facility] to check in every quarter as well. SW provided education on palliative care, discussed goals, reviewed care plan, provided emotional support, used active and reflective listening. 3. PATIENT/CAREGIVER EDUCATION/ COPING:  Patient was alert during visit but did not engage much. Patient appeared to be [facility], daughter reiterated everything shared during visit to patient. Patient is a widow and  retired Radiation protection practitioner. Daughter lives in [place] and son lives in [place]. Family is supportive.  4. PERSONAL EMERGENCY PLAN:  Patient/family/caregiver to call 911 for emergencies. 5. COMMUNITY RESOURCES COORDINATION/ HEALTH CARE NAVIGATION:  Family manages patient care. [facility] provides 2 caregivers 5 days a week for 8 hours a day. Son is in the home at [facility]. Daughter visits on the weekends. 6. FINANCIAL/LEGAL CONCERNS/INTERVENTIONS:  None.     SOCIAL HX:  Social History   Tobacco Use  . Smoking status: Never Smoker  . Smokeless tobacco: Never Used  Substance Use Topics  . Alcohol use: No    Alcohol/week: 0.0 standard drinks    CODE STATUS: Full code  ADVANCED DIRECTIVES: Y MOST FORM COMPLETE:  Will review at next visit HOSPICE EDUCATION PROVIDED: N  PPS: Patient uses RW and needs SBA-CGA with all task.    Time spent: 45 min   [place], [place]

## 2020-02-03 ENCOUNTER — Other Ambulatory Visit: Payer: Self-pay | Admitting: Internal Medicine

## 2020-02-04 MED ORDER — FLUOXETINE HCL 40 MG PO CAPS: 40.0000 mg | ORAL_CAPSULE | Freq: Every day | ORAL | 0 refills | Status: DC

## 2020-02-04 MED ORDER — FLUTICASONE-SALMETEROL 500-50 MCG/DOSE IN AEPB: INHALATION_SPRAY | RESPIRATORY_TRACT | 0 refills | Status: DC

## 2020-02-04 MED ORDER — METHENAMINE HIPPURATE 1 G PO TABS: 1.0000 g | ORAL_TABLET | Freq: Two times a day (BID) | ORAL | 0 refills | Status: DC

## 2020-02-04 MED ORDER — MONTELUKAST SODIUM 10 MG PO TABS
10.0000 mg | ORAL_TABLET | Freq: Every day | ORAL | 0 refills | Status: DC
Start: 2020-02-04 — End: 2020-03-04

## 2020-02-14 ENCOUNTER — Other Ambulatory Visit: Payer: Self-pay | Admitting: Internal Medicine

## 2020-02-14 MED ORDER — PANTOPRAZOLE SODIUM 40 MG PO TBEC
40.0000 mg | DELAYED_RELEASE_TABLET | Freq: Every day | ORAL | 0 refills | Status: DC
Start: 2020-02-14 — End: 2020-03-17

## 2020-02-20 ENCOUNTER — Other Ambulatory Visit: Payer: Self-pay | Admitting: Internal Medicine

## 2020-02-21 MED ORDER — ALLOPURINOL 100 MG PO TABS: ORAL_TABLET | ORAL | 0 refills | Status: DC

## 2020-02-21 MED ORDER — LEVOTHYROXINE SODIUM 50 MCG PO TABS: 50.0000 ug | ORAL_TABLET | Freq: Every day | ORAL | 0 refills | Status: DC

## 2020-03-04 ENCOUNTER — Other Ambulatory Visit: Payer: Self-pay | Admitting: Internal Medicine

## 2020-03-04 MED ORDER — METHENAMINE HIPPURATE 1 G PO TABS: 1.0000 g | ORAL_TABLET | Freq: Two times a day (BID) | ORAL | 0 refills | Status: DC

## 2020-03-04 MED ORDER — FLUOXETINE HCL 40 MG PO CAPS: 40.0000 mg | ORAL_CAPSULE | Freq: Every day | ORAL | 0 refills | Status: DC

## 2020-03-04 MED ORDER — MONTELUKAST SODIUM 10 MG PO TABS
10.0000 mg | ORAL_TABLET | Freq: Every day | ORAL | 0 refills | Status: DC
Start: 2020-03-04 — End: 2020-04-03

## 2020-03-04 MED ORDER — FLUTICASONE-SALMETEROL 500-50 MCG/DOSE IN AEPB: INHALATION_SPRAY | RESPIRATORY_TRACT | 0 refills | Status: DC

## 2020-03-06 ENCOUNTER — Telehealth: Payer: Self-pay

## 2020-03-06 NOTE — Telephone Encounter (Signed)
Palliative care SW outreached patients daughter, Raynelle Fanning, to check in on patient and possibly schedule in home visit for SW and RN. LVM. Awaiting return call.

## 2020-03-11 ENCOUNTER — Other Ambulatory Visit: Payer: Self-pay

## 2020-03-11 ENCOUNTER — Ambulatory Visit (INDEPENDENT_AMBULATORY_CARE_PROVIDER_SITE_OTHER): Payer: Medicare Other

## 2020-03-11 DIAGNOSIS — E538 Deficiency of other specified B group vitamins: Secondary | ICD-10-CM | POA: Diagnosis not present

## 2020-03-11 MED ORDER — CYANOCOBALAMIN 1000 MCG/ML IJ SOLN
1000.0000 ug | Freq: Once | INTRAMUSCULAR | Status: AC
  Administered 2020-03-11: 1000 ug via INTRAMUSCULAR

## 2020-03-11 NOTE — Progress Notes (Addendum)
Patient presented for B 12 injection to left deltoid, patient voiced no concerns nor showed any signs of distress during injection.  Reviewed.  Dr Scott 

## 2020-03-17 ENCOUNTER — Other Ambulatory Visit: Payer: Self-pay | Admitting: Internal Medicine

## 2020-03-17 MED ORDER — PANTOPRAZOLE SODIUM 40 MG PO TBEC: 40.0000 mg | DELAYED_RELEASE_TABLET | Freq: Every day | ORAL | 0 refills | Status: DC

## 2020-03-18 ENCOUNTER — Other Ambulatory Visit: Payer: Self-pay | Admitting: Internal Medicine

## 2020-03-20 ENCOUNTER — Other Ambulatory Visit: Payer: Self-pay | Admitting: Internal Medicine

## 2020-03-20 MED ORDER — ALLOPURINOL 100 MG PO TABS: ORAL_TABLET | ORAL | 0 refills | Status: DC

## 2020-03-20 MED ORDER — LEVOTHYROXINE SODIUM 50 MCG PO TABS: 50.0000 ug | ORAL_TABLET | Freq: Every day | ORAL | 0 refills | Status: DC

## 2020-04-03 ENCOUNTER — Other Ambulatory Visit: Payer: Self-pay | Admitting: Internal Medicine

## 2020-04-03 MED ORDER — FLUOXETINE HCL 40 MG PO CAPS
40.0000 mg | ORAL_CAPSULE | Freq: Every day | ORAL | 0 refills | Status: DC
Start: 2020-04-03 — End: 2020-05-02

## 2020-04-03 MED ORDER — MONTELUKAST SODIUM 10 MG PO TABS
10.0000 mg | ORAL_TABLET | Freq: Every day | ORAL | 0 refills | Status: DC
Start: 2020-04-03 — End: 2020-05-02

## 2020-04-03 MED ORDER — FLUTICASONE-SALMETEROL 500-50 MCG/DOSE IN AEPB: INHALATION_SPRAY | RESPIRATORY_TRACT | 0 refills | Status: DC

## 2020-04-03 MED ORDER — METHENAMINE HIPPURATE 1 G PO TABS: 1.0000 g | ORAL_TABLET | Freq: Two times a day (BID) | ORAL | 0 refills | Status: DC

## 2020-04-04 ENCOUNTER — Telehealth: Payer: Self-pay

## 2020-04-04 NOTE — Telephone Encounter (Signed)
1052 am.  Phone call made to Mona-daughter to schedule an appointment to see patient.  No answer but message has been left on VM requesting a call back.  PLAN:  Awaiting call back from [place].  If no call back, I will reach out again.

## 2020-04-14 ENCOUNTER — Encounter: Payer: Self-pay | Admitting: Internal Medicine

## 2020-04-14 ENCOUNTER — Other Ambulatory Visit: Payer: Self-pay

## 2020-04-14 ENCOUNTER — Ambulatory Visit (INDEPENDENT_AMBULATORY_CARE_PROVIDER_SITE_OTHER): Payer: Medicare Other | Admitting: Internal Medicine

## 2020-04-14 DIAGNOSIS — J449 Chronic obstructive pulmonary disease, unspecified: Secondary | ICD-10-CM

## 2020-04-14 DIAGNOSIS — F0280 Dementia in other diseases classified elsewhere without behavioral disturbance: Secondary | ICD-10-CM | POA: Diagnosis not present

## 2020-04-14 DIAGNOSIS — G309 Alzheimer's disease, unspecified: Secondary | ICD-10-CM

## 2020-04-14 DIAGNOSIS — F0150 Vascular dementia without behavioral disturbance: Secondary | ICD-10-CM | POA: Diagnosis not present

## 2020-04-14 DIAGNOSIS — N183 Chronic kidney disease, stage 3 unspecified: Secondary | ICD-10-CM | POA: Diagnosis not present

## 2020-04-14 DIAGNOSIS — E559 Vitamin D deficiency, unspecified: Secondary | ICD-10-CM | POA: Diagnosis not present

## 2020-04-14 DIAGNOSIS — K219 Gastro-esophageal reflux disease without esophagitis: Secondary | ICD-10-CM

## 2020-04-14 DIAGNOSIS — E7800 Pure hypercholesterolemia, unspecified: Secondary | ICD-10-CM | POA: Diagnosis not present

## 2020-04-14 DIAGNOSIS — R531 Weakness: Secondary | ICD-10-CM

## 2020-04-14 DIAGNOSIS — E1151 Type 2 diabetes mellitus with diabetic peripheral angiopathy without gangrene: Secondary | ICD-10-CM | POA: Diagnosis not present

## 2020-04-14 NOTE — Progress Notes (Addendum)
[name], female   DOB: Feb 25, 1927, 86 y.o.   MRN: [id number]   Subjective:    [name], female    DOB: 1926-12-12, 86 y.o.   MRN: [id number]  HPI This visit occurred during the SARS-CoV-2 public health emergency.  Safety protocols were in place, including screening questions prior to the visit, additional usage of staff PPE, and extensive cleaning of exam room while observing appropriate contact time as indicated for disinfecting solutions.  Patient here for a scheduled follow up.  She is accompanied by her daughter.  History obtained from both of them.  Overall feels things are stable.  Breathing relatively stable.  Feels does better on advair 500.  No chest pain.  Eating.  No nausea or vomiting reported.  No abdominal pain reported.  Some weakness.  Discussed physical therapy to help with strengthening.    Past Medical History:  Diagnosis Date  . Asthma   . Chronic bronchitis ([place])   . COPD (chronic obstructive pulmonary disease) (H[facility])    uses inhalers  . Depression   . Diabetes mellitus ([place])   . GERD (gastroesophageal reflux disease)    hiatal hernia  . Gout   . Hematuria    followed by urology  . History of colon polyps   . History of frequent urinary tract infections   . Humeral fracture 9/09   comminuted impacted proximal  . Hypercholesterolemia   . Hypertension   . Hypothyroidism   . Peripheral vascular disease ([place])   . Sleep apnea    does not use cpap as it did not benefit her   Past Surgical History:  Procedure Laterality Date  . BLADDER SUSPENSION  12/08/99  . BREAST BIOPSY Right 1960's  . CARPAL TUNNEL RELEASE Right 03/17/2017   Procedure: CARPAL TUNNEL RELEASE;  Surgeon: Hessie Knows, MD;  Location: [facility] ORS;  Service: Orthopedics;  Laterality: Right;  . CHOLECYSTECTOMY  1994  . COLONOSCOPY W/ POLYPECTOMY    . EYE SURGERY Bilateral    cataract extractions  . KNEE SURGERY Right 2013   arthroscopy  . ORIF WRIST FRACTURE Right  03/17/2017   Procedure: OPEN REDUCTION INTERNAL FIXATION (ORIF) WRIST FRACTURE;  Surgeon: Hessie Knows, MD;  Location: [facility] ORS;  Service: Orthopedics;  Laterality: Right;   Family History  Problem Relation Age of Onset  . Hypertension Father   . CVA Father   . CAD Father   . Hypertension Mother   . Colitis Mother   . CAD Mother   . Breast cancer Neg Hx   . Colon cancer Neg Hx    Social History   Socioeconomic History  . Marital status: Widowed    Spouse name: Not on file  . Number of children: 2  . Years of education: Not on file  . Highest education level: Not on file  Occupational History  . Not on file  Tobacco Use  . Smoking status: Never Smoker  . Smokeless tobacco: Never Used  Vaping Use  . Vaping Use: Never used  Substance and Sexual Activity  . Alcohol use: No    Alcohol/week: 0.0 standard drinks  . Drug use: No  . Sexual activity: Never  Other Topics Concern  . Not on file  Social History Narrative  . Not on file   Social Determinants of Health   Financial Resource Strain:   . Difficulty of Paying Living Expenses: Not on file  Food Insecurity:   . Worried About Charity fundraiser  in the Last Year: Not on file  . Ran Out of Food in the Last Year: Not on file  Transportation Needs:   . Lack of Transportation (Medical): Not on file  . Lack of Transportation (Non-Medical): Not on file  Physical Activity:   . Days of Exercise per Week: Not on file  . Minutes of Exercise per Session: Not on file  Stress:   . Feeling of Stress : Not on file  Social Connections:   . Frequency of Communication with Friends and Family: Not on file  . Frequency of Social Gatherings with Friends and Family: Not on file  . Attends Religious Services: Not on file  . Active Member of Clubs or Organizations: Not on file  . Attends Archivist Meetings: Not on file  . Marital Status: Not on file    Outpatient Encounter Medications as of 04/14/2020  Medication Sig    . albuterol (VENTOLIN HFA) 108 (90 Base) MCG/ACT inhaler Inhale 2 puffs into the lungs every 6 (six) hours as needed for wheezi ng or shortness of breath.  . Calcium Carbonate-Vitamin D (CALCIUM 600+D) 600-400 MG-UNIT per tablet Take 1 tablet by mouth daily.   . cetirizine (ZYRTEC) 10 MG tablet Take 10 mg by mouth daily.  . Cholecalciferol (VITAMIN D-3) 1000 UNITS CAPS Take 1 capsule by mouth daily.  . Cranberry-Cholecalciferol 4200-500 MG-UNIT CAPS Take 1 capsule by mouth daily.   Marland Kitchen FLUoxetine (PROZAC) 40 MG capsule Take 1 capsule (40 mg total) by mouth daily.  . Fluticasone-Salmeterol (ADVAIR DISKUS) 500-50 MCG/DOSE AEPB Inhale 1 puff into the lungs 2 (two) times daily.  . methenamine (HIPREX) 1 g tablet Take 1 tablet (1 g total) by mouth 2 (two) times daily with a meal.  . montelukast (SINGULAIR) 10 MG tablet Take 1 tablet (10 mg total) by mouth at bedtime.  . simvastatin (ZOCOR) 20 MG tablet Take 1 tablet (20 mg total) by mouth daily.  Marland Kitchen tolterodine (DETROL) 2 MG tablet Take 1 tablet (2 mg total) by mouth 2 (two) times daily.  . [DISCONTINUED] allopurinol (ZYLOPRIM) 100 MG tablet One tab PO daily  . [DISCONTINUED] levothyroxine (SYNTHROID) 50 MCG tablet Take 1 tablet (50 mcg total) by mouth daily before breakfast.  . [DISCONTINUED] nystatin (MYCOSTATIN/NYSTOP) powder Apply topically 2 (two) times daily.  . [DISCONTINUED] nystatin cream (MYCOSTATIN) Apply 1 application topically 2 (two) times daily.  . [DISCONTINUED] pantoprazole (PROTONIX) 40 MG tablet Take 1 tablet (40 mg total) by mouth daily.   No facility-administered encounter medications on file as of 04/14/2020.    Review of Systems  Constitutional: Negative for appetite change.       Eating.   HENT: Negative for congestion and sinus pressure.   Respiratory: Negative for chest tightness.        Breathing overall stable.    Cardiovascular: Negative for chest pain, palpitations and leg swelling.  Gastrointestinal: Negative for  abdominal pain, diarrhea, nausea and vomiting.  Genitourinary: Negative for difficulty urinating and dysuria.  Musculoskeletal: Negative for joint swelling and myalgias.  Skin: Negative for color change and rash.  Neurological: Negative for dizziness, light-headedness and headaches.  Psychiatric/Behavioral: Negative for agitation and dysphoric mood.       Objective:    Physical Exam Vitals reviewed.  Constitutional:      General: She is not in acute distress.    Appearance: Normal appearance.  HENT:     Head: Normocephalic and atraumatic.     Right Ear: External ear normal.  Left Ear: External ear normal.  Eyes:     General: No scleral icterus.       Left eye: No discharge.     Conjunctiva/sclera: Conjunctivae normal.  Neck:     Thyroid: No thyromegaly.  Cardiovascular:     Rate and Rhythm: Normal rate and regular rhythm.  Pulmonary:     Effort: No respiratory distress.     Breath sounds: Normal breath sounds. No wheezing.  Abdominal:     General: Bowel sounds are normal.     Palpations: Abdomen is soft.     Tenderness: There is no abdominal tenderness.  Musculoskeletal:        General: No swelling or tenderness.     Cervical back: Neck supple. No tenderness.  Lymphadenopathy:     Cervical: No cervical adenopathy.  Skin:    Findings: No erythema or rash.  Neurological:     Mental Status: She is alert.  Psychiatric:        Mood and Affect: Mood normal.        Behavior: Behavior normal.     BP 128/70   Pulse 86   Temp 98 F (36.7 C) (Oral)   Resp 16   Ht '5\' 6"'  (1.676 m)   Wt 154 lb (69.9 kg)   SpO2 96%   BMI 24.86 kg/m  Wt Readings from Last 3 Encounters:  04/14/20 154 lb (69.9 kg)  01/10/20 156 lb (70.8 kg)  10/08/19 163 lb 3.2 oz (74 kg)     Lab Results  Component Value Date   WBC 9.6 10/08/2019   HGB 12.2 10/08/2019   HCT 37.8 10/08/2019   PLT 247.0 10/08/2019   GLUCOSE 110 (H) 01/10/2020   CHOL 128 01/10/2020   TRIG 106.0 01/10/2020    HDL 42.90 01/10/2020   LDLCALC 64 01/10/2020   ALT 14 01/10/2020   AST 25 01/10/2020   NA 139 01/10/2020   K 4.7 01/10/2020   CL 103 01/10/2020   CREATININE 1.22 (H) 01/10/2020   BUN 23 01/10/2020   CO2 26 01/10/2020   TSH 1.86 01/10/2020   HGBA1C 6.4 01/10/2020   MICROALBUR 8.8 (H) 07/10/2019    DG Chest 2 View  Result Date: 03/14/2018 CLINICAL DATA:  Short of breath, weakness, low oxygen saturation EXAM: CHEST - 2 VIEW COMPARISON:  Chest x-ray of 01/27/2018 FINDINGS: No active infiltrate or effusion is seen. There is some peribronchial thickening which may indicate bronchitis. Also somewhat prominent interstitial markings are present at the lung bases consistent with fibrotic change. Mediastinal and hilar contours are unremarkable and the heart is within normal limits in size. No acute bony abnormality is seen IMPRESSION: 1. No active lung disease. 2. Changes of bronchitis and probable pulmonary fibrosis at the lung bases. . Electronically Signed   By: Ivar Drape M.D.   On: 03/14/2018 15:40       Assessment & Plan:   Problem List Items Addressed This Visit    Weakness    Weakness.  Felt to be related to her COPD.  Not getting up and moving around as much.  Needs assistance with ambulation, etc.  Discussed.  Refer to PT for evaluation and treatment.        Relevant Orders   Ambulatory referral to Home Health   Vitamin D deficiency    Follow vitamin D level.       Mixed Alzheimer's and vascular dementia ([place])    Evaluated by neurology.  On aricept.  Follow.  Hypercholesterolemia    On simvastatin.  Follow lipid panel and liver function tests.        GERD (gastroesophageal reflux disease)    No upper symptoms reported.  On protonix.        Diabetes mellitus with peripheral vascular disease (H[facility])    Follow met b and a1c.       CKD (chronic kidney disease) stage 3, GFR 30-59 ml/min ([facility])    Stay hydrated.  Follow antiinflammatories.  Follow metabolic panel.         Chronic obstructive airway disease with asthma ([facility])    Breathing stable on advair 500.  Follow.            Einar Pheasant, MD

## 2020-04-15 ENCOUNTER — Other Ambulatory Visit: Payer: Self-pay | Admitting: Internal Medicine

## 2020-04-15 DIAGNOSIS — H353131 Nonexudative age-related macular degeneration, bilateral, early dry stage: Secondary | ICD-10-CM | POA: Diagnosis not present

## 2020-04-15 MED ORDER — PANTOPRAZOLE SODIUM 40 MG PO TBEC
40.0000 mg | DELAYED_RELEASE_TABLET | Freq: Every day | ORAL | 0 refills | Status: DC
Start: 2020-04-15 — End: 2020-05-13

## 2020-04-16 ENCOUNTER — Other Ambulatory Visit: Payer: Self-pay | Admitting: Internal Medicine

## 2020-04-19 ENCOUNTER — Encounter: Payer: Self-pay | Admitting: Internal Medicine

## 2020-04-19 NOTE — Assessment & Plan Note (Signed)
On simvastatin.  Follow lipid panel and liver function tests.   

## 2020-04-19 NOTE — Assessment & Plan Note (Signed)
Follow met b and a1c.  

## 2020-04-19 NOTE — Assessment & Plan Note (Signed)
Breathing stable on advair 500.  Follow.

## 2020-04-19 NOTE — Assessment & Plan Note (Addendum)
Weakness.  Felt to be related to her COPD.  Not getting up and moving around as much.  Needs assistance with ambulation, etc.  Discussed.  Refer to PT for evaluation and treatment.

## 2020-04-19 NOTE — Assessment & Plan Note (Signed)
No upper symptoms reported.  On protonix.   

## 2020-04-19 NOTE — Assessment & Plan Note (Signed)
Evaluated by neurology.  On aricept.  Follow.

## 2020-04-19 NOTE — Assessment & Plan Note (Signed)
Stay hydrated.  Follow antiinflammatories.  Follow metabolic panel.

## 2020-04-19 NOTE — Assessment & Plan Note (Signed)
Follow vitamin D level.  

## 2020-04-20 ENCOUNTER — Other Ambulatory Visit: Payer: Self-pay | Admitting: Internal Medicine

## 2020-04-20 ENCOUNTER — Encounter: Payer: Self-pay | Admitting: Internal Medicine

## 2020-04-21 ENCOUNTER — Other Ambulatory Visit: Payer: Self-pay

## 2020-04-21 MED ORDER — LEVOTHYROXINE SODIUM 50 MCG PO TABS
50.0000 ug | ORAL_TABLET | Freq: Every day | ORAL | 0 refills | Status: DC
Start: 2020-04-21 — End: 2020-04-22

## 2020-04-21 MED ORDER — ALLOPURINOL 100 MG PO TABS: ORAL_TABLET | ORAL | 0 refills | Status: DC

## 2020-04-21 MED ORDER — NYSTATIN 100000 UNIT/GM EX CREA: 1.0000 "application " | TOPICAL_CREAM | Freq: Two times a day (BID) | CUTANEOUS | 1 refills | Status: AC

## 2020-04-22 ENCOUNTER — Other Ambulatory Visit: Payer: Self-pay | Admitting: Internal Medicine

## 2020-05-02 ENCOUNTER — Other Ambulatory Visit: Payer: Self-pay | Admitting: Internal Medicine

## 2020-05-02 MED ORDER — METHENAMINE HIPPURATE 1 G PO TABS
1.0000 g | ORAL_TABLET | Freq: Two times a day (BID) | ORAL | 0 refills | Status: DC
Start: 2020-05-02 — End: 2020-06-01

## 2020-05-02 MED ORDER — FLUTICASONE-SALMETEROL 500-50 MCG/DOSE IN AEPB
INHALATION_SPRAY | RESPIRATORY_TRACT | 0 refills | Status: DC
Start: 2020-05-02 — End: 2020-06-01

## 2020-05-02 MED ORDER — FLUOXETINE HCL 40 MG PO CAPS
40.0000 mg | ORAL_CAPSULE | Freq: Every day | ORAL | 0 refills | Status: DC
Start: 2020-05-02 — End: 2020-06-01

## 2020-05-02 MED ORDER — MONTELUKAST SODIUM 10 MG PO TABS
10.0000 mg | ORAL_TABLET | Freq: Every day | ORAL | 0 refills | Status: DC
Start: 2020-05-02 — End: 2020-06-07

## 2020-05-03 DIAGNOSIS — M109 Gout, unspecified: Secondary | ICD-10-CM | POA: Diagnosis not present

## 2020-05-03 DIAGNOSIS — E039 Hypothyroidism, unspecified: Secondary | ICD-10-CM | POA: Diagnosis not present

## 2020-05-03 DIAGNOSIS — F32A Depression, unspecified: Secondary | ICD-10-CM | POA: Diagnosis not present

## 2020-05-03 DIAGNOSIS — E1122 Type 2 diabetes mellitus with diabetic chronic kidney disease: Secondary | ICD-10-CM | POA: Diagnosis not present

## 2020-05-03 DIAGNOSIS — G309 Alzheimer's disease, unspecified: Secondary | ICD-10-CM | POA: Diagnosis not present

## 2020-05-03 DIAGNOSIS — N183 Chronic kidney disease, stage 3 unspecified: Secondary | ICD-10-CM | POA: Diagnosis not present

## 2020-05-03 DIAGNOSIS — E7800 Pure hypercholesterolemia, unspecified: Secondary | ICD-10-CM | POA: Diagnosis not present

## 2020-05-03 DIAGNOSIS — G4733 Obstructive sleep apnea (adult) (pediatric): Secondary | ICD-10-CM | POA: Diagnosis not present

## 2020-05-03 DIAGNOSIS — E1151 Type 2 diabetes mellitus with diabetic peripheral angiopathy without gangrene: Secondary | ICD-10-CM | POA: Diagnosis not present

## 2020-05-03 DIAGNOSIS — J449 Chronic obstructive pulmonary disease, unspecified: Secondary | ICD-10-CM | POA: Diagnosis not present

## 2020-05-03 DIAGNOSIS — F0390 Unspecified dementia without behavioral disturbance: Secondary | ICD-10-CM | POA: Diagnosis not present

## 2020-05-03 DIAGNOSIS — M6281 Muscle weakness (generalized): Secondary | ICD-10-CM | POA: Diagnosis not present

## 2020-05-03 DIAGNOSIS — I129 Hypertensive chronic kidney disease with stage 1 through stage 4 chronic kidney disease, or unspecified chronic kidney disease: Secondary | ICD-10-CM | POA: Diagnosis not present

## 2020-05-08 ENCOUNTER — Telehealth: Payer: Self-pay

## 2020-05-08 DIAGNOSIS — N183 Chronic kidney disease, stage 3 unspecified: Secondary | ICD-10-CM | POA: Diagnosis not present

## 2020-05-08 DIAGNOSIS — J449 Chronic obstructive pulmonary disease, unspecified: Secondary | ICD-10-CM | POA: Diagnosis not present

## 2020-05-08 DIAGNOSIS — G309 Alzheimer's disease, unspecified: Secondary | ICD-10-CM | POA: Diagnosis not present

## 2020-05-08 DIAGNOSIS — M6281 Muscle weakness (generalized): Secondary | ICD-10-CM | POA: Diagnosis not present

## 2020-05-08 DIAGNOSIS — E1122 Type 2 diabetes mellitus with diabetic chronic kidney disease: Secondary | ICD-10-CM | POA: Diagnosis not present

## 2020-05-08 DIAGNOSIS — I129 Hypertensive chronic kidney disease with stage 1 through stage 4 chronic kidney disease, or unspecified chronic kidney disease: Secondary | ICD-10-CM | POA: Diagnosis not present

## 2020-05-08 NOTE — Telephone Encounter (Signed)
4:12PM: Palliative care SW outreached patients daughter, Raynelle Fanning, to inquire of continuing palliative care services for patient and possibly schedule in home visit.   SW LVM awaiting return call.

## 2020-05-09 ENCOUNTER — Telehealth: Payer: Self-pay

## 2020-05-09 NOTE — Telephone Encounter (Signed)
11AM: Palliative care SW outreached PCP office to make aware of attmepts to conduct palliative care calls and visits for patient with no success or return calls.  Palliative will end services at this time.

## 2020-05-13 ENCOUNTER — Other Ambulatory Visit: Payer: Self-pay | Admitting: Internal Medicine

## 2020-05-13 DIAGNOSIS — N183 Chronic kidney disease, stage 3 unspecified: Secondary | ICD-10-CM | POA: Diagnosis not present

## 2020-05-13 DIAGNOSIS — G309 Alzheimer's disease, unspecified: Secondary | ICD-10-CM | POA: Diagnosis not present

## 2020-05-13 DIAGNOSIS — M6281 Muscle weakness (generalized): Secondary | ICD-10-CM | POA: Diagnosis not present

## 2020-05-13 DIAGNOSIS — E1122 Type 2 diabetes mellitus with diabetic chronic kidney disease: Secondary | ICD-10-CM | POA: Diagnosis not present

## 2020-05-13 DIAGNOSIS — J449 Chronic obstructive pulmonary disease, unspecified: Secondary | ICD-10-CM | POA: Diagnosis not present

## 2020-05-13 DIAGNOSIS — I129 Hypertensive chronic kidney disease with stage 1 through stage 4 chronic kidney disease, or unspecified chronic kidney disease: Secondary | ICD-10-CM | POA: Diagnosis not present

## 2020-05-13 MED ORDER — PANTOPRAZOLE SODIUM 40 MG PO TBEC: 40.0000 mg | DELAYED_RELEASE_TABLET | Freq: Every day | ORAL | 0 refills | Status: DC

## 2020-05-14 ENCOUNTER — Ambulatory Visit (INDEPENDENT_AMBULATORY_CARE_PROVIDER_SITE_OTHER): Payer: Medicare Other

## 2020-05-14 ENCOUNTER — Other Ambulatory Visit: Payer: Self-pay

## 2020-05-14 DIAGNOSIS — E538 Deficiency of other specified B group vitamins: Secondary | ICD-10-CM

## 2020-05-14 MED ORDER — CYANOCOBALAMIN 1000 MCG/ML IJ SOLN
1000.0000 ug | Freq: Once | INTRAMUSCULAR | Status: AC
  Administered 2020-05-14: 16:00:00 1000 ug via INTRAMUSCULAR

## 2020-05-14 NOTE — Progress Notes (Signed)
Patient presented for B 12 injection to right deltoid, patient voiced no concerns nor showed any signs of distress during injection. 

## 2020-05-15 DIAGNOSIS — Z23 Encounter for immunization: Secondary | ICD-10-CM | POA: Diagnosis not present

## 2020-05-16 ENCOUNTER — Encounter: Payer: Self-pay | Admitting: Internal Medicine

## 2020-05-19 DIAGNOSIS — I129 Hypertensive chronic kidney disease with stage 1 through stage 4 chronic kidney disease, or unspecified chronic kidney disease: Secondary | ICD-10-CM | POA: Diagnosis not present

## 2020-05-19 DIAGNOSIS — G309 Alzheimer's disease, unspecified: Secondary | ICD-10-CM | POA: Diagnosis not present

## 2020-05-19 DIAGNOSIS — J449 Chronic obstructive pulmonary disease, unspecified: Secondary | ICD-10-CM | POA: Diagnosis not present

## 2020-05-19 DIAGNOSIS — E1122 Type 2 diabetes mellitus with diabetic chronic kidney disease: Secondary | ICD-10-CM | POA: Diagnosis not present

## 2020-05-19 DIAGNOSIS — N183 Chronic kidney disease, stage 3 unspecified: Secondary | ICD-10-CM | POA: Diagnosis not present

## 2020-05-19 DIAGNOSIS — M6281 Muscle weakness (generalized): Secondary | ICD-10-CM | POA: Diagnosis not present

## 2020-05-19 MED ORDER — SIMVASTATIN 20 MG PO TABS: 20.0000 mg | ORAL_TABLET | Freq: Every day | ORAL | 1 refills | Status: DC

## 2020-05-21 ENCOUNTER — Other Ambulatory Visit: Payer: Self-pay | Admitting: Internal Medicine

## 2020-05-21 MED ORDER — ALLOPURINOL 100 MG PO TABS: ORAL_TABLET | ORAL | 0 refills | Status: DC

## 2020-05-21 MED ORDER — LEVOTHYROXINE SODIUM 50 MCG PO TABS: 50.0000 ug | ORAL_TABLET | Freq: Every day | ORAL | 0 refills | Status: DC

## 2020-05-30 DIAGNOSIS — G309 Alzheimer's disease, unspecified: Secondary | ICD-10-CM | POA: Diagnosis not present

## 2020-05-30 DIAGNOSIS — E1122 Type 2 diabetes mellitus with diabetic chronic kidney disease: Secondary | ICD-10-CM | POA: Diagnosis not present

## 2020-05-30 DIAGNOSIS — J449 Chronic obstructive pulmonary disease, unspecified: Secondary | ICD-10-CM | POA: Diagnosis not present

## 2020-05-30 DIAGNOSIS — I129 Hypertensive chronic kidney disease with stage 1 through stage 4 chronic kidney disease, or unspecified chronic kidney disease: Secondary | ICD-10-CM | POA: Diagnosis not present

## 2020-05-30 DIAGNOSIS — M6281 Muscle weakness (generalized): Secondary | ICD-10-CM | POA: Diagnosis not present

## 2020-05-30 DIAGNOSIS — N183 Chronic kidney disease, stage 3 unspecified: Secondary | ICD-10-CM | POA: Diagnosis not present

## 2020-06-01 ENCOUNTER — Other Ambulatory Visit: Payer: Self-pay | Admitting: Internal Medicine

## 2020-06-02 DIAGNOSIS — M109 Gout, unspecified: Secondary | ICD-10-CM | POA: Diagnosis not present

## 2020-06-02 DIAGNOSIS — I129 Hypertensive chronic kidney disease with stage 1 through stage 4 chronic kidney disease, or unspecified chronic kidney disease: Secondary | ICD-10-CM | POA: Diagnosis not present

## 2020-06-02 DIAGNOSIS — G4733 Obstructive sleep apnea (adult) (pediatric): Secondary | ICD-10-CM | POA: Diagnosis not present

## 2020-06-02 DIAGNOSIS — J449 Chronic obstructive pulmonary disease, unspecified: Secondary | ICD-10-CM | POA: Diagnosis not present

## 2020-06-02 DIAGNOSIS — E7800 Pure hypercholesterolemia, unspecified: Secondary | ICD-10-CM | POA: Diagnosis not present

## 2020-06-02 DIAGNOSIS — E1122 Type 2 diabetes mellitus with diabetic chronic kidney disease: Secondary | ICD-10-CM | POA: Diagnosis not present

## 2020-06-02 DIAGNOSIS — F32A Depression, unspecified: Secondary | ICD-10-CM | POA: Diagnosis not present

## 2020-06-02 DIAGNOSIS — G309 Alzheimer's disease, unspecified: Secondary | ICD-10-CM | POA: Diagnosis not present

## 2020-06-02 DIAGNOSIS — F0390 Unspecified dementia without behavioral disturbance: Secondary | ICD-10-CM | POA: Diagnosis not present

## 2020-06-02 DIAGNOSIS — M6281 Muscle weakness (generalized): Secondary | ICD-10-CM | POA: Diagnosis not present

## 2020-06-02 DIAGNOSIS — N183 Chronic kidney disease, stage 3 unspecified: Secondary | ICD-10-CM | POA: Diagnosis not present

## 2020-06-02 DIAGNOSIS — E1151 Type 2 diabetes mellitus with diabetic peripheral angiopathy without gangrene: Secondary | ICD-10-CM | POA: Diagnosis not present

## 2020-06-02 DIAGNOSIS — E039 Hypothyroidism, unspecified: Secondary | ICD-10-CM | POA: Diagnosis not present

## 2020-06-02 MED ORDER — FLUOXETINE HCL 40 MG PO CAPS: 40.0000 mg | ORAL_CAPSULE | Freq: Every day | ORAL | 0 refills | Status: DC

## 2020-06-02 MED ORDER — FLUTICASONE-SALMETEROL 500-50 MCG/DOSE IN AEPB: INHALATION_SPRAY | RESPIRATORY_TRACT | 0 refills | Status: DC

## 2020-06-02 MED ORDER — METHENAMINE HIPPURATE 1 G PO TABS: 1.0000 g | ORAL_TABLET | Freq: Two times a day (BID) | ORAL | 0 refills | Status: DC

## 2020-06-04 DIAGNOSIS — J449 Chronic obstructive pulmonary disease, unspecified: Secondary | ICD-10-CM | POA: Diagnosis not present

## 2020-06-04 DIAGNOSIS — G309 Alzheimer's disease, unspecified: Secondary | ICD-10-CM | POA: Diagnosis not present

## 2020-06-04 DIAGNOSIS — I129 Hypertensive chronic kidney disease with stage 1 through stage 4 chronic kidney disease, or unspecified chronic kidney disease: Secondary | ICD-10-CM | POA: Diagnosis not present

## 2020-06-04 DIAGNOSIS — N183 Chronic kidney disease, stage 3 unspecified: Secondary | ICD-10-CM | POA: Diagnosis not present

## 2020-06-04 DIAGNOSIS — M6281 Muscle weakness (generalized): Secondary | ICD-10-CM | POA: Diagnosis not present

## 2020-06-04 DIAGNOSIS — E1122 Type 2 diabetes mellitus with diabetic chronic kidney disease: Secondary | ICD-10-CM | POA: Diagnosis not present

## 2020-06-07 ENCOUNTER — Other Ambulatory Visit: Payer: Self-pay | Admitting: Internal Medicine

## 2020-06-09 MED ORDER — MONTELUKAST SODIUM 10 MG PO TABS: 10.0000 mg | ORAL_TABLET | Freq: Every day | ORAL | 0 refills | Status: DC

## 2020-06-12 ENCOUNTER — Other Ambulatory Visit: Payer: Self-pay | Admitting: Internal Medicine

## 2020-06-12 MED ORDER — PANTOPRAZOLE SODIUM 40 MG PO TBEC: 40.0000 mg | DELAYED_RELEASE_TABLET | Freq: Every day | ORAL | 0 refills | Status: DC

## 2020-06-13 DIAGNOSIS — E1122 Type 2 diabetes mellitus with diabetic chronic kidney disease: Secondary | ICD-10-CM | POA: Diagnosis not present

## 2020-06-13 DIAGNOSIS — M6281 Muscle weakness (generalized): Secondary | ICD-10-CM | POA: Diagnosis not present

## 2020-06-13 DIAGNOSIS — G309 Alzheimer's disease, unspecified: Secondary | ICD-10-CM | POA: Diagnosis not present

## 2020-06-13 DIAGNOSIS — N183 Chronic kidney disease, stage 3 unspecified: Secondary | ICD-10-CM | POA: Diagnosis not present

## 2020-06-13 DIAGNOSIS — I129 Hypertensive chronic kidney disease with stage 1 through stage 4 chronic kidney disease, or unspecified chronic kidney disease: Secondary | ICD-10-CM | POA: Diagnosis not present

## 2020-06-13 DIAGNOSIS — J449 Chronic obstructive pulmonary disease, unspecified: Secondary | ICD-10-CM | POA: Diagnosis not present

## 2020-06-15 ENCOUNTER — Other Ambulatory Visit: Payer: Self-pay | Admitting: Internal Medicine

## 2020-06-16 ENCOUNTER — Ambulatory Visit: Payer: Medicare Other

## 2020-06-16 MED ORDER — TOLTERODINE TARTRATE 2 MG PO TABS: 2.0000 mg | ORAL_TABLET | Freq: Two times a day (BID) | ORAL | 0 refills | Status: DC

## 2020-06-20 DIAGNOSIS — J449 Chronic obstructive pulmonary disease, unspecified: Secondary | ICD-10-CM | POA: Diagnosis not present

## 2020-06-20 DIAGNOSIS — E1122 Type 2 diabetes mellitus with diabetic chronic kidney disease: Secondary | ICD-10-CM | POA: Diagnosis not present

## 2020-06-20 DIAGNOSIS — M6281 Muscle weakness (generalized): Secondary | ICD-10-CM | POA: Diagnosis not present

## 2020-06-20 DIAGNOSIS — G309 Alzheimer's disease, unspecified: Secondary | ICD-10-CM | POA: Diagnosis not present

## 2020-06-20 DIAGNOSIS — N183 Chronic kidney disease, stage 3 unspecified: Secondary | ICD-10-CM | POA: Diagnosis not present

## 2020-06-20 DIAGNOSIS — I129 Hypertensive chronic kidney disease with stage 1 through stage 4 chronic kidney disease, or unspecified chronic kidney disease: Secondary | ICD-10-CM | POA: Diagnosis not present

## 2020-06-23 ENCOUNTER — Ambulatory Visit (INDEPENDENT_AMBULATORY_CARE_PROVIDER_SITE_OTHER): Payer: Medicare Other

## 2020-06-23 ENCOUNTER — Other Ambulatory Visit: Payer: Self-pay

## 2020-06-23 DIAGNOSIS — E538 Deficiency of other specified B group vitamins: Secondary | ICD-10-CM

## 2020-06-23 MED ORDER — CYANOCOBALAMIN 1000 MCG/ML IJ SOLN
1000.0000 ug | Freq: Once | INTRAMUSCULAR | Status: AC
  Administered 2020-06-23: 1000 ug via INTRAMUSCULAR

## 2020-06-23 NOTE — Progress Notes (Signed)
Patient presented for B 12 injection to right deltoid, patient voiced no concerns nor showed any signs of distress during injection. 

## 2020-06-27 DIAGNOSIS — I129 Hypertensive chronic kidney disease with stage 1 through stage 4 chronic kidney disease, or unspecified chronic kidney disease: Secondary | ICD-10-CM | POA: Diagnosis not present

## 2020-06-27 DIAGNOSIS — M6281 Muscle weakness (generalized): Secondary | ICD-10-CM | POA: Diagnosis not present

## 2020-06-27 DIAGNOSIS — G309 Alzheimer's disease, unspecified: Secondary | ICD-10-CM | POA: Diagnosis not present

## 2020-06-27 DIAGNOSIS — E1122 Type 2 diabetes mellitus with diabetic chronic kidney disease: Secondary | ICD-10-CM | POA: Diagnosis not present

## 2020-06-27 DIAGNOSIS — N183 Chronic kidney disease, stage 3 unspecified: Secondary | ICD-10-CM | POA: Diagnosis not present

## 2020-06-27 DIAGNOSIS — J449 Chronic obstructive pulmonary disease, unspecified: Secondary | ICD-10-CM | POA: Diagnosis not present

## 2020-06-29 ENCOUNTER — Other Ambulatory Visit: Payer: Self-pay | Admitting: Family Medicine

## 2020-06-30 MED ORDER — LEVOTHYROXINE SODIUM 50 MCG PO TABS: 50.0000 ug | ORAL_TABLET | Freq: Every day | ORAL | 0 refills | Status: DC

## 2020-06-30 MED ORDER — ALLOPURINOL 100 MG PO TABS: ORAL_TABLET | ORAL | 0 refills | Status: DC

## 2020-07-03 ENCOUNTER — Other Ambulatory Visit: Payer: Self-pay | Admitting: Internal Medicine

## 2020-07-03 MED ORDER — METHENAMINE HIPPURATE 1 G PO TABS: 1.0000 g | ORAL_TABLET | Freq: Two times a day (BID) | ORAL | 0 refills | Status: DC

## 2020-07-03 MED ORDER — FLUOXETINE HCL 40 MG PO CAPS: 40.0000 mg | ORAL_CAPSULE | Freq: Every day | ORAL | 0 refills | Status: DC

## 2020-07-03 MED ORDER — FLUTICASONE-SALMETEROL 500-50 MCG/DOSE IN AEPB: INHALATION_SPRAY | RESPIRATORY_TRACT | 0 refills | Status: DC

## 2020-07-06 ENCOUNTER — Other Ambulatory Visit: Payer: Self-pay | Admitting: Internal Medicine

## 2020-07-07 MED ORDER — MONTELUKAST SODIUM 10 MG PO TABS: 10.0000 mg | ORAL_TABLET | Freq: Every day | ORAL | 0 refills | Status: DC

## 2020-07-09 ENCOUNTER — Other Ambulatory Visit: Payer: Self-pay | Admitting: Internal Medicine

## 2020-07-09 MED ORDER — PANTOPRAZOLE SODIUM 40 MG PO TBEC: 40.0000 mg | DELAYED_RELEASE_TABLET | Freq: Every day | ORAL | 0 refills | Status: DC

## 2020-07-09 MED ORDER — MONTELUKAST SODIUM 10 MG PO TABS: 10.0000 mg | ORAL_TABLET | Freq: Every day | ORAL | 0 refills | Status: DC

## 2020-07-17 ENCOUNTER — Encounter: Payer: Self-pay | Admitting: Internal Medicine

## 2020-07-17 ENCOUNTER — Other Ambulatory Visit: Payer: Self-pay

## 2020-07-17 ENCOUNTER — Ambulatory Visit (INDEPENDENT_AMBULATORY_CARE_PROVIDER_SITE_OTHER): Payer: Medicare Other | Admitting: Internal Medicine

## 2020-07-17 DIAGNOSIS — R109 Unspecified abdominal pain: Secondary | ICD-10-CM | POA: Diagnosis not present

## 2020-07-17 DIAGNOSIS — I739 Peripheral vascular disease, unspecified: Secondary | ICD-10-CM

## 2020-07-17 DIAGNOSIS — E1151 Type 2 diabetes mellitus with diabetic peripheral angiopathy without gangrene: Secondary | ICD-10-CM

## 2020-07-17 DIAGNOSIS — J4489 Other specified chronic obstructive pulmonary disease: Secondary | ICD-10-CM

## 2020-07-17 DIAGNOSIS — F0280 Dementia in other diseases classified elsewhere without behavioral disturbance: Secondary | ICD-10-CM

## 2020-07-17 DIAGNOSIS — K219 Gastro-esophageal reflux disease without esophagitis: Secondary | ICD-10-CM | POA: Diagnosis not present

## 2020-07-17 DIAGNOSIS — J449 Chronic obstructive pulmonary disease, unspecified: Secondary | ICD-10-CM

## 2020-07-17 DIAGNOSIS — F0150 Vascular dementia without behavioral disturbance: Secondary | ICD-10-CM

## 2020-07-17 DIAGNOSIS — E7800 Pure hypercholesterolemia, unspecified: Secondary | ICD-10-CM

## 2020-07-17 DIAGNOSIS — R531 Weakness: Secondary | ICD-10-CM

## 2020-07-17 DIAGNOSIS — K5900 Constipation, unspecified: Secondary | ICD-10-CM | POA: Diagnosis not present

## 2020-07-17 DIAGNOSIS — N183 Chronic kidney disease, stage 3 unspecified: Secondary | ICD-10-CM

## 2020-07-17 DIAGNOSIS — G4730 Sleep apnea, unspecified: Secondary | ICD-10-CM | POA: Diagnosis not present

## 2020-07-17 DIAGNOSIS — G309 Alzheimer's disease, unspecified: Secondary | ICD-10-CM

## 2020-07-17 DIAGNOSIS — I10 Essential (primary) hypertension: Secondary | ICD-10-CM | POA: Diagnosis not present

## 2020-07-17 NOTE — Progress Notes (Signed)
[name], female   DOB: February 14, 1927, 87 y.o.   MRN: [id number]   Subjective:    [name], female    DOB: 1927-02-18, 87 y.o.   MRN: [id number]  HPI This visit occurred during the SARS-CoV-2 public health emergency.  Safety protocols were in place, including screening questions prior to the visit, additional usage of staff PPE, and extensive cleaning of exam room while observing appropriate contact time as indicated for disinfecting solutions.  Patient here for a scheduled follow up. She is accompanied by her daughter.  History obtained from both of them.  Overall it appears she is relatively stable.  Breathing stable.  No increased cough or congestion currently.  Eating - when awake.  Does sleep a lot.  Weight is stable.  Not getting up and around as much.  Recently had PT and they wanted to extend therapy.  She was not interested at that time. Discussed today.  Agreeable for PT with [facility].  No chest pain reported.  Some abdominal discomfort at times.  Question if related to some constipation.  Bowels are moving. Discussed bowel regimen. miralax makes her cough.  Daughter was concerned regarding change around rectum.  Question of prolapse.  No blood.     Past Medical History:  Diagnosis Date  . Asthma   . Chronic bronchitis ([place])   . COPD (chronic obstructive pulmonary disease) (HCC)    uses inhalers  . Depression   . Diabetes mellitus ([place])   . GERD (gastroesophageal reflux disease)    hiatal hernia  . Gout   . Hematuria    followed by urology  . History of colon polyps   . History of frequent urinary tract infections   . Humeral fracture 9/09   comminuted impacted proximal  . Hypercholesterolemia   . Hypertension   . Hypothyroidism   . Peripheral vascular disease ([place])   . Sleep apnea    does not use cpap as it did not benefit her   Past Surgical History:  Procedure Laterality Date  . BLADDER SUSPENSION  12/08/99  . BREAST BIOPSY Right  1960's  . CARPAL TUNNEL RELEASE Right 03/17/2017   Procedure: CARPAL TUNNEL RELEASE;  Surgeon: Hessie Knows, MD;  Location: [facility] ORS;  Service: Orthopedics;  Laterality: Right;  . CHOLECYSTECTOMY  1994  . COLONOSCOPY W/ POLYPECTOMY    . EYE SURGERY Bilateral    cataract extractions  . KNEE SURGERY Right 2013   arthroscopy  . ORIF WRIST FRACTURE Right 03/17/2017   Procedure: OPEN REDUCTION INTERNAL FIXATION (ORIF) WRIST FRACTURE;  Surgeon: Hessie Knows, MD;  Location: [facility] ORS;  Service: Orthopedics;  Laterality: Right;   Family History  Problem Relation Age of Onset  . Hypertension Father   . CVA Father   . CAD Father   . Hypertension Mother   . Colitis Mother   . CAD Mother   . Breast cancer Neg Hx   . Colon cancer Neg Hx    Social History   Socioeconomic History  . Marital status: Widowed    Spouse name: Not on file  . Number of children: 2  . Years of education: Not on file  . Highest education level: Not on file  Occupational History  . Not on file  Tobacco Use  . Smoking status: Never Smoker  . Smokeless tobacco: Never Used  Vaping Use  . Vaping Use: Never used  Substance and Sexual Activity  . Alcohol use: No  Alcohol/week: 0.0 standard drinks  . Drug use: No  . Sexual activity: Never  Other Topics Concern  . Not on file  Social History Narrative  . Not on file   Social Determinants of Health   Financial Resource Strain: Not on file  Food Insecurity: Not on file  Transportation Needs: Not on file  Physical Activity: Not on file  Stress: Not on file  Social Connections: Not on file    Outpatient Encounter Medications as of 07/17/2020  Medication Sig  . albuterol (VENTOLIN HFA) 108 (90 Base) MCG/ACT inhaler Inhale 2 puffs into the lungs every 6 (six) hours as needed for wheezi ng or shortness of breath.  . allopurinol (ZYLOPRIM) 100 MG tablet One tab PO daily  . Calcium Carbonate-Vitamin D (CALCIUM 600+D) 600-400 MG-UNIT per tablet Take 1 tablet by  mouth daily.   . cetirizine (ZYRTEC) 10 MG tablet Take 10 mg by mouth daily.  . Cholecalciferol (VITAMIN D-3) 1000 UNITS CAPS Take 1 capsule by mouth daily.  . Cranberry-Cholecalciferol 4200-500 MG-UNIT CAPS Take 1 capsule by mouth daily.   Marland Kitchen donepezil (ARICEPT ODT) 5 MG disintegrating tablet Take 5 mg by mouth daily.  Marland Kitchen FLUoxetine (PROZAC) 40 MG capsule Take 1 capsule (40 mg total) by mouth daily.  . Fluticasone-Salmeterol (ADVAIR DISKUS) 500-50 MCG/DOSE AEPB Inhale 1 puff into the lungs 2 (two) times daily.  Marland Kitchen levothyroxine (SYNTHROID) 50 MCG tablet Take 1 tablet (50 mcg total) by mouth daily before breakfast.  . methenamine (HIPREX) 1 g tablet Take 1 tablet (1 g total) by mouth 2 (two) times daily with a meal.  . montelukast (SINGULAIR) 10 MG tablet Take 1 tablet (10 mg total) by mouth at bedtime.  Marland Kitchen nystatin (MYCOSTATIN/NYSTOP) powder Apply topically 2 (two) times daily.  Marland Kitchen nystatin cream (MYCOSTATIN) Apply 1 application topically 2 (two) times daily.  . pantoprazole (PROTONIX) 40 MG tablet Take 1 tablet (40 mg total) by mouth daily.  . simvastatin (ZOCOR) 20 MG tablet Take 1 tablet (20 mg total) by mouth daily.  Marland Kitchen tolterodine (DETROL) 2 MG tablet Take 1 tablet (2 mg total) by mouth 2 (two) times daily.   No facility-administered encounter medications on file as of 07/17/2020.    Review of Systems  Constitutional: Negative for unexpected weight change.       Appetite as outlined.    HENT: Negative for congestion and sinus pressure.   Respiratory: Negative for cough and chest tightness.        Breathing stable.   Cardiovascular: Negative for chest pain, palpitations and leg swelling.  Gastrointestinal: Positive for abdominal pain and constipation. Negative for nausea and vomiting.  Genitourinary: Negative for difficulty urinating and dysuria.  Musculoskeletal: Negative for joint swelling and myalgias.  Skin: Negative for color change and rash.  Neurological: Negative for dizziness,  light-headedness and headaches.  Psychiatric/Behavioral: Negative for agitation and dysphoric mood.       Objective:    Physical Exam Vitals reviewed.  Constitutional:      General: She is not in acute distress.    Appearance: Normal appearance.  HENT:     Head: Normocephalic and atraumatic.     Right Ear: External ear normal.     Left Ear: External ear normal.     Mouth/Throat:     Mouth: Oropharynx is clear and moist.  Eyes:     General:        Right eye: No discharge.        Left eye: No discharge.  Conjunctiva/sclera: Conjunctivae normal.  Neck:     Thyroid: No thyromegaly.  Cardiovascular:     Rate and Rhythm: Normal rate and regular rhythm.  Pulmonary:     Effort: No respiratory distress.     Breath sounds: Normal breath sounds. No wheezing.  Abdominal:     General: Bowel sounds are normal.     Palpations: Abdomen is soft.     Comments: Some mild tenderness to palpation lower right abdomen.  No rebound or guarding.   Genitourinary:    Comments: Rectal exam:  No prolapse noted on exam today.  No hemorrhoid.  No palpable mass.   Musculoskeletal:        General: No swelling, tenderness or edema.     Cervical back: Neck supple. No tenderness.  Lymphadenopathy:     Cervical: No cervical adenopathy.  Skin:    Findings: No erythema or rash.  Neurological:     Mental Status: She is alert.  Psychiatric:        Mood and Affect: Mood normal.        Behavior: Behavior normal.     BP 124/70   Pulse 78   Temp 98 F (36.7 C) (Oral)   Resp 16   Ht '5\' 6"'  (1.676 m)   Wt 154 lb (69.9 kg)   SpO2 97%   BMI 24.86 kg/m  Wt Readings from Last 3 Encounters:  07/17/20 154 lb (69.9 kg)  04/14/20 154 lb (69.9 kg)  01/10/20 156 lb (70.8 kg)     Lab Results  Component Value Date   WBC 9.6 10/08/2019   HGB 12.2 10/08/2019   HCT 37.8 10/08/2019   PLT 247.0 10/08/2019   GLUCOSE 88 07/17/2020   CHOL 128 01/10/2020   TRIG 106.0 01/10/2020   HDL 42.90 01/10/2020    LDLCALC 64 01/10/2020   ALT 13 07/17/2020   AST 23 07/17/2020   NA 139 07/17/2020   K 4.1 07/17/2020   CL 103 07/17/2020   CREATININE 1.16 07/17/2020   BUN 29 (H) 07/17/2020   CO2 27 07/17/2020   TSH 1.86 01/10/2020   HGBA1C 6.1 07/17/2020   MICROALBUR 8.8 (H) 07/10/2019    DG Chest 2 View  Result Date: 03/14/2018 CLINICAL DATA:  Short of breath, weakness, low oxygen saturation EXAM: CHEST - 2 VIEW COMPARISON:  Chest x-ray of 01/27/2018 FINDINGS: No active infiltrate or effusion is seen. There is some peribronchial thickening which may indicate bronchitis. Also somewhat prominent interstitial markings are present at the lung bases consistent with fibrotic change. Mediastinal and hilar contours are unremarkable and the heart is within normal limits in size. No acute bony abnormality is seen IMPRESSION: 1. No active lung disease. 2. Changes of bronchitis and probable pulmonary fibrosis at the lung bases. . Electronically Signed   By: Ivar Drape M.D.   On: 03/14/2018 15:40       Assessment & Plan:   Problem List Items Addressed This Visit    Abdominal pain    She reports has been better. Appears to flare intermittently.  Question if related to constipation.  Will see if we can keep bowels moving regularly and see if this improves her abdominal discomfort. Discussed further scanning and w/up.  Wants to try above and follow. Notify me if persistent pain.       Chronic obstructive airway disease with asthma ([place])    Continue advair.  Has rescue inhaler if needed.  Follow.       Relevant Orders  Ambulatory referral to Home Health   CKD (chronic kidney disease) stage 3, GFR 30-59 ml/min (HCC)    Stay hydrated. Avoid antiinflammatories.  Check metabolic panel today.       Constipation    Discussed continuing stool softener daily.  benefiber daily.  Follow.  Rectal exam as outlined.  No prolapse noted on exam.  Follow.       Diabetes mellitus with peripheral vascular disease ([place])     Follow met b and a1c.  Check today.  On no medication.       Relevant Orders   Hemoglobin A1c (Completed)   GERD (gastroesophageal reflux disease)    No upper symptoms reported.  Continue protonix.       Hypercholesterolemia    On simvastatin.  Follow lipid panel and liver function tests.       Relevant Orders   Hepatic function panel (Completed)   Hypertension    Blood pressure doing well on no medication now.  Follow.  Check metabolic panel.       Relevant Orders   Basic metabolic panel (Completed)   Mixed Alzheimer's and vascular dementia ([place])    Evaluated by neurology.  Has been on aricept.  Follow.       Relevant Medications   donepezil (ARICEPT ODT) 5 MG disintegrating tablet   Other Relevant Orders   Ambulatory referral to [place]   Peripheral vascular disease ([place])    Has known varying pressures in each arm.  No pain.  Continue simvastatin.  Follow.       Sleep apnea    Has declined treatment.       Weakness    Persistent.  Not getting up as much.  Felt to be related to underlying breathing issues, etc.  Will have home health PT reevaluate and treat.  Pt and daughter prefer [facility].       Relevant Orders   Ambulatory referral to [facility] spent more than 40 minutes with the patient and more than 50% of the time was spent in consultation regarding the above.  Time spent discussing current symptoms and concerns.  Time also spent discussing further evaluation and treatment.   Einar Pheasant, MD

## 2020-07-18 ENCOUNTER — Encounter: Payer: Self-pay | Admitting: Internal Medicine

## 2020-07-18 DIAGNOSIS — K5900 Constipation, unspecified: Secondary | ICD-10-CM | POA: Insufficient documentation

## 2020-07-18 DIAGNOSIS — R109 Unspecified abdominal pain: Secondary | ICD-10-CM | POA: Insufficient documentation

## 2020-07-18 LAB — BASIC METABOLIC PANEL
BUN: 29 mg/dL — ABNORMAL HIGH (ref 6–23)
CO2: 27 mEq/L (ref 19–32)
Calcium: 9.1 mg/dL (ref 8.4–10.5)
Chloride: 103 mEq/L (ref 96–112)
Creatinine, Ser: 1.16 mg/dL (ref 0.40–1.20)
GFR: 41.16 mL/min — ABNORMAL LOW (ref >60.00)
Glucose, Bld: 88 mg/dL (ref 70–99)
Potassium: 4.1 mEq/L (ref 3.5–5.1)
Sodium: 139 mEq/L (ref 135–145)

## 2020-07-18 LAB — HEPATIC FUNCTION PANEL
ALT: 13 U/L (ref 0–35)
AST: 23 U/L (ref 0–37)
Albumin: 3.3 g/dL — ABNORMAL LOW (ref 3.5–5.2)
Alkaline Phosphatase: 66 U/L (ref 39–117)
Bilirubin, Direct: 0.1 mg/dL (ref 0.0–0.3)
Total Bilirubin: 0.3 mg/dL (ref 0.2–1.2)
Total Protein: 6 g/dL (ref 6.0–8.3)

## 2020-07-18 LAB — HEMOGLOBIN A1C: Hgb A1c MFr Bld: 6.1 % (ref 4.6–6.5)

## 2020-07-18 NOTE — Assessment & Plan Note (Signed)
Blood pressure doing well on no medication now.  Follow.  Check metabolic panel.

## 2020-07-18 NOTE — Assessment & Plan Note (Signed)
Has known varying pressures in each arm.  No pain.  Continue simvastatin.  Follow.

## 2020-07-18 NOTE — Assessment & Plan Note (Signed)
Continue advair.  Has rescue inhaler if needed.  Follow.

## 2020-07-18 NOTE — Assessment & Plan Note (Signed)
Follow met b and a1c.  Check today.  On no medication.

## 2020-07-18 NOTE — Assessment & Plan Note (Signed)
She reports has been better. Appears to flare intermittently.  Question if related to constipation.  Will see if we can keep bowels moving regularly and see if this improves her abdominal discomfort. Discussed further scanning and w/up.  Wants to try above and follow. Notify me if persistent pain.

## 2020-07-18 NOTE — Assessment & Plan Note (Signed)
Has declined treatment

## 2020-07-18 NOTE — Assessment & Plan Note (Signed)
On simvastatin.  Follow lipid panel and liver function tests.   

## 2020-07-18 NOTE — Assessment & Plan Note (Signed)
Evaluated by neurology.  Has been on aricept.  Follow.

## 2020-07-18 NOTE — Assessment & Plan Note (Signed)
Discussed continuing stool softener daily.  benefiber daily.  Follow.  Rectal exam as outlined.  No prolapse noted on exam.  Follow.

## 2020-07-18 NOTE — Assessment & Plan Note (Signed)
Stay hydrated. Avoid antiinflammatories.  Check metabolic panel today.

## 2020-07-18 NOTE — Assessment & Plan Note (Signed)
Persistent.  Not getting up as much.  Felt to be related to underlying breathing issues, etc.  Will have home health PT reevaluate and treat.  Pt and daughter prefer [facility].

## 2020-07-18 NOTE — Assessment & Plan Note (Signed)
No upper symptoms reported.  Continue protonix.  

## 2020-07-24 ENCOUNTER — Ambulatory Visit (INDEPENDENT_AMBULATORY_CARE_PROVIDER_SITE_OTHER): Payer: Medicare Other

## 2020-07-24 ENCOUNTER — Other Ambulatory Visit: Payer: Self-pay

## 2020-07-24 DIAGNOSIS — E538 Deficiency of other specified B group vitamins: Secondary | ICD-10-CM

## 2020-07-24 MED ORDER — CYANOCOBALAMIN 1000 MCG/ML IJ SOLN
1000.0000 ug | Freq: Once | INTRAMUSCULAR | Status: AC
  Administered 2020-07-24: 1000 ug via INTRAMUSCULAR

## 2020-07-24 NOTE — Progress Notes (Signed)
Patient presented for B 12 injection to right deltoid, patient voiced no concerns nor showed any signs of distress during injection. 

## 2020-07-26 DIAGNOSIS — Z7951 Long term (current) use of inhaled steroids: Secondary | ICD-10-CM | POA: Diagnosis not present

## 2020-07-26 DIAGNOSIS — G309 Alzheimer's disease, unspecified: Secondary | ICD-10-CM | POA: Diagnosis not present

## 2020-07-26 DIAGNOSIS — Z9049 Acquired absence of other specified parts of digestive tract: Secondary | ICD-10-CM | POA: Diagnosis not present

## 2020-07-26 DIAGNOSIS — Z9181 History of falling: Secondary | ICD-10-CM | POA: Diagnosis not present

## 2020-07-26 DIAGNOSIS — E1122 Type 2 diabetes mellitus with diabetic chronic kidney disease: Secondary | ICD-10-CM | POA: Diagnosis not present

## 2020-07-26 DIAGNOSIS — J449 Chronic obstructive pulmonary disease, unspecified: Secondary | ICD-10-CM | POA: Diagnosis not present

## 2020-07-26 DIAGNOSIS — E1151 Type 2 diabetes mellitus with diabetic peripheral angiopathy without gangrene: Secondary | ICD-10-CM | POA: Diagnosis not present

## 2020-07-26 DIAGNOSIS — F0280 Dementia in other diseases classified elsewhere without behavioral disturbance: Secondary | ICD-10-CM | POA: Diagnosis not present

## 2020-07-26 DIAGNOSIS — M109 Gout, unspecified: Secondary | ICD-10-CM | POA: Diagnosis not present

## 2020-07-26 DIAGNOSIS — F0150 Vascular dementia without behavioral disturbance: Secondary | ICD-10-CM | POA: Diagnosis not present

## 2020-07-26 DIAGNOSIS — Z8781 Personal history of (healed) traumatic fracture: Secondary | ICD-10-CM | POA: Diagnosis not present

## 2020-07-26 DIAGNOSIS — E039 Hypothyroidism, unspecified: Secondary | ICD-10-CM | POA: Diagnosis not present

## 2020-07-26 DIAGNOSIS — Z86010 Personal history of colonic polyps: Secondary | ICD-10-CM | POA: Diagnosis not present

## 2020-07-26 DIAGNOSIS — F32A Depression, unspecified: Secondary | ICD-10-CM | POA: Diagnosis not present

## 2020-07-26 DIAGNOSIS — G4730 Sleep apnea, unspecified: Secondary | ICD-10-CM | POA: Diagnosis not present

## 2020-07-26 DIAGNOSIS — K449 Diaphragmatic hernia without obstruction or gangrene: Secondary | ICD-10-CM | POA: Diagnosis not present

## 2020-07-26 DIAGNOSIS — K219 Gastro-esophageal reflux disease without esophagitis: Secondary | ICD-10-CM | POA: Diagnosis not present

## 2020-07-26 DIAGNOSIS — Z87440 Personal history of urinary (tract) infections: Secondary | ICD-10-CM | POA: Diagnosis not present

## 2020-07-26 DIAGNOSIS — E7800 Pure hypercholesterolemia, unspecified: Secondary | ICD-10-CM | POA: Diagnosis not present

## 2020-07-26 DIAGNOSIS — N183 Chronic kidney disease, stage 3 unspecified: Secondary | ICD-10-CM | POA: Diagnosis not present

## 2020-07-27 ENCOUNTER — Other Ambulatory Visit: Payer: Self-pay | Admitting: Internal Medicine

## 2020-07-28 MED ORDER — FLUOXETINE HCL 40 MG PO CAPS: 40.0000 mg | ORAL_CAPSULE | Freq: Every day | ORAL | 0 refills | Status: DC

## 2020-07-28 MED ORDER — METHENAMINE HIPPURATE 1 G PO TABS: 1.0000 g | ORAL_TABLET | Freq: Two times a day (BID) | ORAL | 0 refills | Status: DC

## 2020-07-28 MED ORDER — ALLOPURINOL 100 MG PO TABS: ORAL_TABLET | ORAL | 0 refills | Status: DC

## 2020-07-28 MED ORDER — LEVOTHYROXINE SODIUM 50 MCG PO TABS: 50.0000 ug | ORAL_TABLET | Freq: Every day | ORAL | 0 refills | Status: DC

## 2020-07-29 DIAGNOSIS — E1122 Type 2 diabetes mellitus with diabetic chronic kidney disease: Secondary | ICD-10-CM | POA: Diagnosis not present

## 2020-07-29 DIAGNOSIS — N183 Chronic kidney disease, stage 3 unspecified: Secondary | ICD-10-CM | POA: Diagnosis not present

## 2020-07-29 DIAGNOSIS — J449 Chronic obstructive pulmonary disease, unspecified: Secondary | ICD-10-CM | POA: Diagnosis not present

## 2020-07-29 DIAGNOSIS — E1151 Type 2 diabetes mellitus with diabetic peripheral angiopathy without gangrene: Secondary | ICD-10-CM | POA: Diagnosis not present

## 2020-07-29 DIAGNOSIS — K219 Gastro-esophageal reflux disease without esophagitis: Secondary | ICD-10-CM | POA: Diagnosis not present

## 2020-07-29 DIAGNOSIS — K449 Diaphragmatic hernia without obstruction or gangrene: Secondary | ICD-10-CM | POA: Diagnosis not present

## 2020-08-01 ENCOUNTER — Other Ambulatory Visit: Payer: Self-pay | Admitting: Internal Medicine

## 2020-08-01 DIAGNOSIS — E1151 Type 2 diabetes mellitus with diabetic peripheral angiopathy without gangrene: Secondary | ICD-10-CM | POA: Diagnosis not present

## 2020-08-01 DIAGNOSIS — N183 Chronic kidney disease, stage 3 unspecified: Secondary | ICD-10-CM | POA: Diagnosis not present

## 2020-08-01 DIAGNOSIS — K449 Diaphragmatic hernia without obstruction or gangrene: Secondary | ICD-10-CM | POA: Diagnosis not present

## 2020-08-01 DIAGNOSIS — J449 Chronic obstructive pulmonary disease, unspecified: Secondary | ICD-10-CM | POA: Diagnosis not present

## 2020-08-01 DIAGNOSIS — K219 Gastro-esophageal reflux disease without esophagitis: Secondary | ICD-10-CM | POA: Diagnosis not present

## 2020-08-01 DIAGNOSIS — E1122 Type 2 diabetes mellitus with diabetic chronic kidney disease: Secondary | ICD-10-CM | POA: Diagnosis not present

## 2020-08-01 MED ORDER — MONTELUKAST SODIUM 10 MG PO TABS: 10.0000 mg | ORAL_TABLET | Freq: Every day | ORAL | 1 refills | Status: DC

## 2020-08-01 MED ORDER — PANTOPRAZOLE SODIUM 40 MG PO TBEC: 40.0000 mg | DELAYED_RELEASE_TABLET | Freq: Every day | ORAL | 1 refills | Status: DC

## 2020-08-01 MED ORDER — FLUTICASONE-SALMETEROL 500-50 MCG/DOSE IN AEPB: INHALATION_SPRAY | RESPIRATORY_TRACT | 1 refills | Status: DC

## 2020-08-04 DIAGNOSIS — E1151 Type 2 diabetes mellitus with diabetic peripheral angiopathy without gangrene: Secondary | ICD-10-CM | POA: Diagnosis not present

## 2020-08-04 DIAGNOSIS — E1122 Type 2 diabetes mellitus with diabetic chronic kidney disease: Secondary | ICD-10-CM | POA: Diagnosis not present

## 2020-08-04 DIAGNOSIS — N183 Chronic kidney disease, stage 3 unspecified: Secondary | ICD-10-CM | POA: Diagnosis not present

## 2020-08-04 DIAGNOSIS — K449 Diaphragmatic hernia without obstruction or gangrene: Secondary | ICD-10-CM | POA: Diagnosis not present

## 2020-08-04 DIAGNOSIS — J449 Chronic obstructive pulmonary disease, unspecified: Secondary | ICD-10-CM | POA: Diagnosis not present

## 2020-08-04 DIAGNOSIS — K219 Gastro-esophageal reflux disease without esophagitis: Secondary | ICD-10-CM | POA: Diagnosis not present

## 2020-08-08 DIAGNOSIS — J449 Chronic obstructive pulmonary disease, unspecified: Secondary | ICD-10-CM | POA: Diagnosis not present

## 2020-08-08 DIAGNOSIS — K219 Gastro-esophageal reflux disease without esophagitis: Secondary | ICD-10-CM | POA: Diagnosis not present

## 2020-08-08 DIAGNOSIS — K449 Diaphragmatic hernia without obstruction or gangrene: Secondary | ICD-10-CM | POA: Diagnosis not present

## 2020-08-08 DIAGNOSIS — E1151 Type 2 diabetes mellitus with diabetic peripheral angiopathy without gangrene: Secondary | ICD-10-CM | POA: Diagnosis not present

## 2020-08-08 DIAGNOSIS — E1122 Type 2 diabetes mellitus with diabetic chronic kidney disease: Secondary | ICD-10-CM | POA: Diagnosis not present

## 2020-08-08 DIAGNOSIS — N183 Chronic kidney disease, stage 3 unspecified: Secondary | ICD-10-CM | POA: Diagnosis not present

## 2020-08-12 DIAGNOSIS — E1122 Type 2 diabetes mellitus with diabetic chronic kidney disease: Secondary | ICD-10-CM | POA: Diagnosis not present

## 2020-08-12 DIAGNOSIS — E1151 Type 2 diabetes mellitus with diabetic peripheral angiopathy without gangrene: Secondary | ICD-10-CM | POA: Diagnosis not present

## 2020-08-12 DIAGNOSIS — J449 Chronic obstructive pulmonary disease, unspecified: Secondary | ICD-10-CM | POA: Diagnosis not present

## 2020-08-12 DIAGNOSIS — K449 Diaphragmatic hernia without obstruction or gangrene: Secondary | ICD-10-CM | POA: Diagnosis not present

## 2020-08-12 DIAGNOSIS — N183 Chronic kidney disease, stage 3 unspecified: Secondary | ICD-10-CM | POA: Diagnosis not present

## 2020-08-12 DIAGNOSIS — K219 Gastro-esophageal reflux disease without esophagitis: Secondary | ICD-10-CM | POA: Diagnosis not present

## 2020-08-13 DIAGNOSIS — F0150 Vascular dementia without behavioral disturbance: Secondary | ICD-10-CM | POA: Diagnosis not present

## 2020-08-13 DIAGNOSIS — F0280 Dementia in other diseases classified elsewhere without behavioral disturbance: Secondary | ICD-10-CM | POA: Diagnosis not present

## 2020-08-13 DIAGNOSIS — R259 Unspecified abnormal involuntary movements: Secondary | ICD-10-CM | POA: Diagnosis not present

## 2020-08-13 DIAGNOSIS — G309 Alzheimer's disease, unspecified: Secondary | ICD-10-CM | POA: Diagnosis not present

## 2020-08-14 DIAGNOSIS — J449 Chronic obstructive pulmonary disease, unspecified: Secondary | ICD-10-CM | POA: Diagnosis not present

## 2020-08-14 DIAGNOSIS — E1122 Type 2 diabetes mellitus with diabetic chronic kidney disease: Secondary | ICD-10-CM | POA: Diagnosis not present

## 2020-08-14 DIAGNOSIS — N183 Chronic kidney disease, stage 3 unspecified: Secondary | ICD-10-CM | POA: Diagnosis not present

## 2020-08-14 DIAGNOSIS — K449 Diaphragmatic hernia without obstruction or gangrene: Secondary | ICD-10-CM | POA: Diagnosis not present

## 2020-08-14 DIAGNOSIS — K219 Gastro-esophageal reflux disease without esophagitis: Secondary | ICD-10-CM | POA: Diagnosis not present

## 2020-08-14 DIAGNOSIS — E1151 Type 2 diabetes mellitus with diabetic peripheral angiopathy without gangrene: Secondary | ICD-10-CM | POA: Diagnosis not present

## 2020-08-18 DIAGNOSIS — K449 Diaphragmatic hernia without obstruction or gangrene: Secondary | ICD-10-CM | POA: Diagnosis not present

## 2020-08-18 DIAGNOSIS — E1122 Type 2 diabetes mellitus with diabetic chronic kidney disease: Secondary | ICD-10-CM | POA: Diagnosis not present

## 2020-08-18 DIAGNOSIS — K219 Gastro-esophageal reflux disease without esophagitis: Secondary | ICD-10-CM | POA: Diagnosis not present

## 2020-08-18 DIAGNOSIS — N183 Chronic kidney disease, stage 3 unspecified: Secondary | ICD-10-CM | POA: Diagnosis not present

## 2020-08-18 DIAGNOSIS — E1151 Type 2 diabetes mellitus with diabetic peripheral angiopathy without gangrene: Secondary | ICD-10-CM | POA: Diagnosis not present

## 2020-08-18 DIAGNOSIS — J449 Chronic obstructive pulmonary disease, unspecified: Secondary | ICD-10-CM | POA: Diagnosis not present

## 2020-08-21 DIAGNOSIS — K449 Diaphragmatic hernia without obstruction or gangrene: Secondary | ICD-10-CM | POA: Diagnosis not present

## 2020-08-21 DIAGNOSIS — E1122 Type 2 diabetes mellitus with diabetic chronic kidney disease: Secondary | ICD-10-CM | POA: Diagnosis not present

## 2020-08-21 DIAGNOSIS — E1151 Type 2 diabetes mellitus with diabetic peripheral angiopathy without gangrene: Secondary | ICD-10-CM | POA: Diagnosis not present

## 2020-08-21 DIAGNOSIS — N183 Chronic kidney disease, stage 3 unspecified: Secondary | ICD-10-CM | POA: Diagnosis not present

## 2020-08-21 DIAGNOSIS — K219 Gastro-esophageal reflux disease without esophagitis: Secondary | ICD-10-CM | POA: Diagnosis not present

## 2020-08-21 DIAGNOSIS — J449 Chronic obstructive pulmonary disease, unspecified: Secondary | ICD-10-CM | POA: Diagnosis not present

## 2020-08-25 DIAGNOSIS — M109 Gout, unspecified: Secondary | ICD-10-CM | POA: Diagnosis not present

## 2020-08-25 DIAGNOSIS — Z9049 Acquired absence of other specified parts of digestive tract: Secondary | ICD-10-CM | POA: Diagnosis not present

## 2020-08-25 DIAGNOSIS — F32A Depression, unspecified: Secondary | ICD-10-CM | POA: Diagnosis not present

## 2020-08-25 DIAGNOSIS — E1151 Type 2 diabetes mellitus with diabetic peripheral angiopathy without gangrene: Secondary | ICD-10-CM | POA: Diagnosis not present

## 2020-08-25 DIAGNOSIS — F0280 Dementia in other diseases classified elsewhere without behavioral disturbance: Secondary | ICD-10-CM | POA: Diagnosis not present

## 2020-08-25 DIAGNOSIS — G309 Alzheimer's disease, unspecified: Secondary | ICD-10-CM | POA: Diagnosis not present

## 2020-08-25 DIAGNOSIS — K219 Gastro-esophageal reflux disease without esophagitis: Secondary | ICD-10-CM | POA: Diagnosis not present

## 2020-08-25 DIAGNOSIS — K449 Diaphragmatic hernia without obstruction or gangrene: Secondary | ICD-10-CM | POA: Diagnosis not present

## 2020-08-25 DIAGNOSIS — N183 Chronic kidney disease, stage 3 unspecified: Secondary | ICD-10-CM | POA: Diagnosis not present

## 2020-08-25 DIAGNOSIS — F0150 Vascular dementia without behavioral disturbance: Secondary | ICD-10-CM | POA: Diagnosis not present

## 2020-08-25 DIAGNOSIS — Z9181 History of falling: Secondary | ICD-10-CM | POA: Diagnosis not present

## 2020-08-25 DIAGNOSIS — E039 Hypothyroidism, unspecified: Secondary | ICD-10-CM | POA: Diagnosis not present

## 2020-08-25 DIAGNOSIS — E7800 Pure hypercholesterolemia, unspecified: Secondary | ICD-10-CM | POA: Diagnosis not present

## 2020-08-25 DIAGNOSIS — E1122 Type 2 diabetes mellitus with diabetic chronic kidney disease: Secondary | ICD-10-CM | POA: Diagnosis not present

## 2020-08-25 DIAGNOSIS — Z8781 Personal history of (healed) traumatic fracture: Secondary | ICD-10-CM | POA: Diagnosis not present

## 2020-08-25 DIAGNOSIS — Z86010 Personal history of colonic polyps: Secondary | ICD-10-CM | POA: Diagnosis not present

## 2020-08-25 DIAGNOSIS — G4730 Sleep apnea, unspecified: Secondary | ICD-10-CM | POA: Diagnosis not present

## 2020-08-25 DIAGNOSIS — Z7951 Long term (current) use of inhaled steroids: Secondary | ICD-10-CM | POA: Diagnosis not present

## 2020-08-25 DIAGNOSIS — Z87440 Personal history of urinary (tract) infections: Secondary | ICD-10-CM | POA: Diagnosis not present

## 2020-08-25 DIAGNOSIS — J449 Chronic obstructive pulmonary disease, unspecified: Secondary | ICD-10-CM | POA: Diagnosis not present

## 2020-08-26 ENCOUNTER — Other Ambulatory Visit: Payer: Self-pay | Admitting: Internal Medicine

## 2020-08-26 ENCOUNTER — Ambulatory Visit: Payer: Medicare Other

## 2020-08-26 DIAGNOSIS — J449 Chronic obstructive pulmonary disease, unspecified: Secondary | ICD-10-CM | POA: Diagnosis not present

## 2020-08-26 DIAGNOSIS — K449 Diaphragmatic hernia without obstruction or gangrene: Secondary | ICD-10-CM | POA: Diagnosis not present

## 2020-08-26 DIAGNOSIS — E1151 Type 2 diabetes mellitus with diabetic peripheral angiopathy without gangrene: Secondary | ICD-10-CM | POA: Diagnosis not present

## 2020-08-26 DIAGNOSIS — K219 Gastro-esophageal reflux disease without esophagitis: Secondary | ICD-10-CM | POA: Diagnosis not present

## 2020-08-26 DIAGNOSIS — E1122 Type 2 diabetes mellitus with diabetic chronic kidney disease: Secondary | ICD-10-CM | POA: Diagnosis not present

## 2020-08-26 DIAGNOSIS — N183 Chronic kidney disease, stage 3 unspecified: Secondary | ICD-10-CM | POA: Diagnosis not present

## 2020-08-26 MED ORDER — LEVOTHYROXINE SODIUM 50 MCG PO TABS
50.0000 ug | ORAL_TABLET | Freq: Every day | ORAL | 0 refills | Status: DC
Start: 2020-08-26 — End: 2020-09-23

## 2020-08-26 MED ORDER — ALLOPURINOL 100 MG PO TABS: ORAL_TABLET | ORAL | 0 refills | Status: DC

## 2020-09-02 ENCOUNTER — Ambulatory Visit: Payer: Medicare Other

## 2020-09-04 ENCOUNTER — Other Ambulatory Visit: Payer: Self-pay | Admitting: Internal Medicine

## 2020-09-04 DIAGNOSIS — K449 Diaphragmatic hernia without obstruction or gangrene: Secondary | ICD-10-CM | POA: Diagnosis not present

## 2020-09-04 DIAGNOSIS — E1151 Type 2 diabetes mellitus with diabetic peripheral angiopathy without gangrene: Secondary | ICD-10-CM | POA: Diagnosis not present

## 2020-09-04 DIAGNOSIS — J449 Chronic obstructive pulmonary disease, unspecified: Secondary | ICD-10-CM | POA: Diagnosis not present

## 2020-09-04 DIAGNOSIS — E1122 Type 2 diabetes mellitus with diabetic chronic kidney disease: Secondary | ICD-10-CM | POA: Diagnosis not present

## 2020-09-04 DIAGNOSIS — K219 Gastro-esophageal reflux disease without esophagitis: Secondary | ICD-10-CM | POA: Diagnosis not present

## 2020-09-04 DIAGNOSIS — N183 Chronic kidney disease, stage 3 unspecified: Secondary | ICD-10-CM | POA: Diagnosis not present

## 2020-09-04 MED ORDER — METHENAMINE HIPPURATE 1 G PO TABS: 1.0000 g | ORAL_TABLET | Freq: Two times a day (BID) | ORAL | 0 refills | Status: DC

## 2020-09-04 MED ORDER — FLUOXETINE HCL 40 MG PO CAPS: 40.0000 mg | ORAL_CAPSULE | Freq: Every day | ORAL | 0 refills | Status: DC

## 2020-09-08 ENCOUNTER — Other Ambulatory Visit: Payer: Self-pay

## 2020-09-08 ENCOUNTER — Ambulatory Visit (INDEPENDENT_AMBULATORY_CARE_PROVIDER_SITE_OTHER): Payer: Medicare Other

## 2020-09-08 DIAGNOSIS — E538 Deficiency of other specified B group vitamins: Secondary | ICD-10-CM

## 2020-09-08 MED ORDER — CYANOCOBALAMIN 1000 MCG/ML IJ SOLN
1000.0000 ug | Freq: Once | INTRAMUSCULAR | Status: AC
  Administered 2020-09-08: 1000 ug via INTRAMUSCULAR

## 2020-09-08 NOTE — Progress Notes (Signed)
Patient presented for B 12 injection to left deltoid, patient voiced no concerns nor showed any signs of distress during injection. 

## 2020-09-10 DIAGNOSIS — K219 Gastro-esophageal reflux disease without esophagitis: Secondary | ICD-10-CM | POA: Diagnosis not present

## 2020-09-10 DIAGNOSIS — E1122 Type 2 diabetes mellitus with diabetic chronic kidney disease: Secondary | ICD-10-CM | POA: Diagnosis not present

## 2020-09-10 DIAGNOSIS — E1151 Type 2 diabetes mellitus with diabetic peripheral angiopathy without gangrene: Secondary | ICD-10-CM | POA: Diagnosis not present

## 2020-09-10 DIAGNOSIS — N183 Chronic kidney disease, stage 3 unspecified: Secondary | ICD-10-CM | POA: Diagnosis not present

## 2020-09-10 DIAGNOSIS — J449 Chronic obstructive pulmonary disease, unspecified: Secondary | ICD-10-CM | POA: Diagnosis not present

## 2020-09-10 DIAGNOSIS — K449 Diaphragmatic hernia without obstruction or gangrene: Secondary | ICD-10-CM | POA: Diagnosis not present

## 2020-09-11 ENCOUNTER — Other Ambulatory Visit: Payer: Self-pay | Admitting: Internal Medicine

## 2020-09-11 MED ORDER — TOLTERODINE TARTRATE 2 MG PO TABS: 2.0000 mg | ORAL_TABLET | Freq: Two times a day (BID) | ORAL | 0 refills | Status: DC

## 2020-09-18 DIAGNOSIS — K449 Diaphragmatic hernia without obstruction or gangrene: Secondary | ICD-10-CM | POA: Diagnosis not present

## 2020-09-18 DIAGNOSIS — K219 Gastro-esophageal reflux disease without esophagitis: Secondary | ICD-10-CM | POA: Diagnosis not present

## 2020-09-18 DIAGNOSIS — J449 Chronic obstructive pulmonary disease, unspecified: Secondary | ICD-10-CM | POA: Diagnosis not present

## 2020-09-18 DIAGNOSIS — N183 Chronic kidney disease, stage 3 unspecified: Secondary | ICD-10-CM | POA: Diagnosis not present

## 2020-09-18 DIAGNOSIS — E1122 Type 2 diabetes mellitus with diabetic chronic kidney disease: Secondary | ICD-10-CM | POA: Diagnosis not present

## 2020-09-18 DIAGNOSIS — E1151 Type 2 diabetes mellitus with diabetic peripheral angiopathy without gangrene: Secondary | ICD-10-CM | POA: Diagnosis not present

## 2020-09-23 ENCOUNTER — Other Ambulatory Visit: Payer: Self-pay | Admitting: Internal Medicine

## 2020-09-23 DIAGNOSIS — M109 Gout, unspecified: Secondary | ICD-10-CM | POA: Diagnosis not present

## 2020-09-23 DIAGNOSIS — K449 Diaphragmatic hernia without obstruction or gangrene: Secondary | ICD-10-CM | POA: Diagnosis not present

## 2020-09-23 DIAGNOSIS — Z87440 Personal history of urinary (tract) infections: Secondary | ICD-10-CM

## 2020-09-23 DIAGNOSIS — E1122 Type 2 diabetes mellitus with diabetic chronic kidney disease: Secondary | ICD-10-CM | POA: Diagnosis not present

## 2020-09-23 DIAGNOSIS — Z7951 Long term (current) use of inhaled steroids: Secondary | ICD-10-CM

## 2020-09-23 DIAGNOSIS — K219 Gastro-esophageal reflux disease without esophagitis: Secondary | ICD-10-CM | POA: Diagnosis not present

## 2020-09-23 DIAGNOSIS — Z8781 Personal history of (healed) traumatic fracture: Secondary | ICD-10-CM

## 2020-09-23 DIAGNOSIS — Z86010 Personal history of colonic polyps: Secondary | ICD-10-CM

## 2020-09-23 DIAGNOSIS — E7800 Pure hypercholesterolemia, unspecified: Secondary | ICD-10-CM | POA: Diagnosis not present

## 2020-09-23 DIAGNOSIS — E039 Hypothyroidism, unspecified: Secondary | ICD-10-CM | POA: Diagnosis not present

## 2020-09-23 DIAGNOSIS — E1151 Type 2 diabetes mellitus with diabetic peripheral angiopathy without gangrene: Secondary | ICD-10-CM | POA: Diagnosis not present

## 2020-09-23 DIAGNOSIS — Z9049 Acquired absence of other specified parts of digestive tract: Secondary | ICD-10-CM

## 2020-09-23 DIAGNOSIS — F0150 Vascular dementia without behavioral disturbance: Secondary | ICD-10-CM

## 2020-09-23 DIAGNOSIS — F32A Depression, unspecified: Secondary | ICD-10-CM | POA: Diagnosis not present

## 2020-09-23 DIAGNOSIS — G4730 Sleep apnea, unspecified: Secondary | ICD-10-CM

## 2020-09-23 DIAGNOSIS — N183 Chronic kidney disease, stage 3 unspecified: Secondary | ICD-10-CM | POA: Diagnosis not present

## 2020-09-23 DIAGNOSIS — Z9181 History of falling: Secondary | ICD-10-CM

## 2020-09-23 DIAGNOSIS — F0280 Dementia in other diseases classified elsewhere without behavioral disturbance: Secondary | ICD-10-CM | POA: Diagnosis not present

## 2020-09-23 DIAGNOSIS — G309 Alzheimer's disease, unspecified: Secondary | ICD-10-CM | POA: Diagnosis not present

## 2020-09-23 MED ORDER — ALLOPURINOL 100 MG PO TABS: ORAL_TABLET | ORAL | 0 refills | Status: DC

## 2020-09-23 MED ORDER — LEVOTHYROXINE SODIUM 50 MCG PO TABS: 50.0000 ug | ORAL_TABLET | Freq: Every day | ORAL | 0 refills | Status: DC

## 2020-10-01 ENCOUNTER — Other Ambulatory Visit: Payer: Self-pay | Admitting: Internal Medicine

## 2020-10-01 MED ORDER — METHENAMINE HIPPURATE 1 G PO TABS: 1.0000 g | ORAL_TABLET | Freq: Two times a day (BID) | ORAL | 0 refills | Status: DC

## 2020-10-01 MED ORDER — FLUOXETINE HCL 40 MG PO CAPS: 40.0000 mg | ORAL_CAPSULE | Freq: Every day | ORAL | 0 refills | Status: DC

## 2020-10-13 ENCOUNTER — Other Ambulatory Visit: Payer: Self-pay | Admitting: Internal Medicine

## 2020-10-16 ENCOUNTER — Encounter: Payer: Self-pay | Admitting: Internal Medicine

## 2020-10-16 ENCOUNTER — Ambulatory Visit (INDEPENDENT_AMBULATORY_CARE_PROVIDER_SITE_OTHER): Payer: Medicare Other | Admitting: Internal Medicine

## 2020-10-16 ENCOUNTER — Other Ambulatory Visit: Payer: Self-pay

## 2020-10-16 VITALS — BP 118/70 | HR 90 | Temp 98.0°F | Resp 16 | Ht 66.0 in | Wt 148.2 lb

## 2020-10-16 DIAGNOSIS — R29898 Other symptoms and signs involving the musculoskeletal system: Secondary | ICD-10-CM

## 2020-10-16 DIAGNOSIS — J449 Chronic obstructive pulmonary disease, unspecified: Secondary | ICD-10-CM | POA: Diagnosis not present

## 2020-10-16 DIAGNOSIS — I10 Essential (primary) hypertension: Secondary | ICD-10-CM

## 2020-10-16 DIAGNOSIS — G309 Alzheimer's disease, unspecified: Secondary | ICD-10-CM | POA: Diagnosis not present

## 2020-10-16 DIAGNOSIS — E538 Deficiency of other specified B group vitamins: Secondary | ICD-10-CM

## 2020-10-16 DIAGNOSIS — E1151 Type 2 diabetes mellitus with diabetic peripheral angiopathy without gangrene: Secondary | ICD-10-CM | POA: Diagnosis not present

## 2020-10-16 DIAGNOSIS — I739 Peripheral vascular disease, unspecified: Secondary | ICD-10-CM

## 2020-10-16 DIAGNOSIS — K219 Gastro-esophageal reflux disease without esophagitis: Secondary | ICD-10-CM

## 2020-10-16 DIAGNOSIS — N183 Chronic kidney disease, stage 3 unspecified: Secondary | ICD-10-CM | POA: Diagnosis not present

## 2020-10-16 DIAGNOSIS — Z87440 Personal history of urinary (tract) infections: Secondary | ICD-10-CM

## 2020-10-16 DIAGNOSIS — R634 Abnormal weight loss: Secondary | ICD-10-CM | POA: Diagnosis not present

## 2020-10-16 DIAGNOSIS — E7800 Pure hypercholesterolemia, unspecified: Secondary | ICD-10-CM | POA: Diagnosis not present

## 2020-10-16 DIAGNOSIS — R531 Weakness: Secondary | ICD-10-CM | POA: Diagnosis not present

## 2020-10-16 DIAGNOSIS — J4489 Other specified chronic obstructive pulmonary disease: Secondary | ICD-10-CM

## 2020-10-16 DIAGNOSIS — F0150 Vascular dementia without behavioral disturbance: Secondary | ICD-10-CM

## 2020-10-16 DIAGNOSIS — F0280 Dementia in other diseases classified elsewhere without behavioral disturbance: Secondary | ICD-10-CM

## 2020-10-16 LAB — HM DIABETES FOOT EXAM

## 2020-10-16 MED ORDER — CYANOCOBALAMIN 1000 MCG/ML IJ SOLN
1000.0000 ug | Freq: Once | INTRAMUSCULAR | Status: AC
  Administered 2020-10-16: 1000 ug via INTRAMUSCULAR

## 2020-10-16 NOTE — Progress Notes (Signed)
[name], female   DOB: 06-04-1926, 87 y.o.   MRN: [id number]   Subjective:    [name], female    DOB: 01-25-1927, 87 y.o.   MRN: [id number]  HPI This visit occurred during the SARS-CoV-2 public health emergency.  Safety protocols were in place, including screening questions prior to the visit, additional usage of staff PPE, and extensive cleaning of exam room while observing appropriate contact time as indicated for disinfecting solutions.  Patient here for a scheduled follow up. She is accompanied by her daughter.  History obtained from both of them.  Here to follow up regarding her breathing and blood pressure.  Breathing overall is stable.  Has some days - more cough and congestion.  Using inhaler regularly.  Has been having more trouble getting up to walk.  Sleeping a lot.  Not eating as well.  Daughter trying to get her to drink ensure.  Had home health PT.  Completed a couple of weeks ago.  Off aricept - intolerant - vivid dreams.  Off for three weeks.  No chest pain.  No abdominal pain reported.  Discussed palliative care.  Weight loss.    Past Medical History:  Diagnosis Date  . Asthma   . Chronic bronchitis ([place])   . COPD (chronic obstructive pulmonary disease) (HCC)    uses inhalers  . Depression   . Diabetes mellitus ([place])   . GERD (gastroesophageal reflux disease)    hiatal hernia  . Gout   . Hematuria    followed by urology  . History of colon polyps   . History of frequent urinary tract infections   . Humeral fracture 9/09   comminuted impacted proximal  . Hypercholesterolemia   . Hypertension   . Hypothyroidism   . Peripheral vascular disease ([place])   . Sleep apnea    does not use cpap as it did not benefit her   Past Surgical History:  Procedure Laterality Date  . BLADDER SUSPENSION  12/08/99  . BREAST BIOPSY Right 1960's  . CARPAL TUNNEL RELEASE Right 03/17/2017   Procedure: CARPAL TUNNEL RELEASE;  Surgeon: Hessie Knows, MD;   Location: [facility] ORS;  Service: Orthopedics;  Laterality: Right;  . CHOLECYSTECTOMY  1994  . COLONOSCOPY W/ POLYPECTOMY    . EYE SURGERY Bilateral    cataract extractions  . KNEE SURGERY Right 2013   arthroscopy  . ORIF WRIST FRACTURE Right 03/17/2017   Procedure: OPEN REDUCTION INTERNAL FIXATION (ORIF) WRIST FRACTURE;  Surgeon: Hessie Knows, MD;  Location: [facility] ORS;  Service: Orthopedics;  Laterality: Right;   Family History  Problem Relation Age of Onset  . Hypertension Father   . CVA Father   . CAD Father   . Hypertension Mother   . Colitis Mother   . CAD Mother   . Breast cancer Neg Hx   . Colon cancer Neg Hx    Social History   Socioeconomic History  . Marital status: Widowed    Spouse name: Not on file  . Number of children: 2  . Years of education: Not on file  . Highest education level: Not on file  Occupational History  . Not on file  Tobacco Use  . Smoking status: Never Smoker  . Smokeless tobacco: Never Used  Vaping Use  . Vaping Use: Never used  Substance and Sexual Activity  . Alcohol use: No    Alcohol/week: 0.0 standard drinks  . Drug use: No  . Sexual activity: Never  Other Topics Concern  . Not on file  Social History Narrative  . Not on file   Social Determinants of Health   Financial Resource Strain: Not on file  Food Insecurity: Not on file  Transportation Needs: Not on file  Physical Activity: Not on file  Stress: Not on file  Social Connections: Not on file    Outpatient Encounter Medications as of 10/16/2020  Medication Sig  . ADVAIR DISKUS 500-50 MCG/ACT AEPB Inhale 1 puff into the lungs 2 (two) times daily.  Marland Kitchen albuterol (VENTOLIN HFA) 108 (90 Base) MCG/ACT inhaler Inhale 2 puffs into the lungs every 6 (six) hours as needed for wheezi ng or shortness of breath.  . allopurinol (ZYLOPRIM) 100 MG tablet One tab PO daily  . Calcium Carbonate-Vitamin D 600-400 MG-UNIT tablet Take 1 tablet by mouth daily.   . cetirizine (ZYRTEC) 10 MG  tablet Take 10 mg by mouth daily.  . Cholecalciferol (VITAMIN D-3) 1000 UNITS CAPS Take 1 capsule by mouth daily.  . Cranberry-Cholecalciferol 4200-500 MG-UNIT CAPS Take 1 capsule by mouth daily.   Marland Kitchen FLUoxetine (PROZAC) 40 MG capsule Take 1 capsule (40 mg total) by mouth daily.  Marland Kitchen levothyroxine (SYNTHROID) 50 MCG tablet Take 1 tablet (50 mcg total) by mouth daily before breakfast.  . methenamine (HIPREX) 1 g tablet Take 1 tablet (1 g total) by mouth 2 (two) times daily with a meal.  . montelukast (SINGULAIR) 10 MG tablet Take 1 tablet (10 mg total) by mouth at bedtime.  Marland Kitchen nystatin (MYCOSTATIN/NYSTOP) powder Apply topically 2 (two) times daily.  Marland Kitchen nystatin cream (MYCOSTATIN) Apply 1 application topically 2 (two) times daily.  . pantoprazole (PROTONIX) 40 MG tablet Take 1 tablet (40 mg total) by mouth daily.  . simvastatin (ZOCOR) 20 MG tablet Take 1 tablet (20 mg total) by mouth daily.  Marland Kitchen tolterodine (DETROL) 2 MG tablet Take 1 tablet (2 mg total) by mouth 2 (two) times daily.  . [DISCONTINUED] donepezil (ARICEPT ODT) 5 MG disintegrating tablet Take 5 mg by mouth daily. (Patient not taking: Reported on 10/16/2020)  . [EXPIRED] cyanocobalamin ((VITAMIN B-12)) injection 1,000 mcg    No facility-administered encounter medications on file as of 10/16/2020.    Review of Systems  Constitutional:       Not eating as much.  Weight is down.   HENT: Negative for congestion and sinus pressure.   Respiratory: Positive for cough. Negative for chest tightness.        Breathing overall stable.   Cardiovascular: Negative for chest pain, palpitations and leg swelling.  Gastrointestinal: Negative for abdominal pain, diarrhea, nausea and vomiting.  Genitourinary: Negative for difficulty urinating and dysuria.  Musculoskeletal: Negative for joint swelling and myalgias.       Difficulty getting up from her chair.  Trouble walking.  Uses walker.   Skin: Negative for color change and rash.  Neurological:  Negative for dizziness, light-headedness and headaches.       Weakness.    Psychiatric/Behavioral: Negative for agitation and dysphoric mood.       Objective:    Physical Exam Vitals reviewed.  Constitutional:      General: She is not in acute distress.    Appearance: Normal appearance.  HENT:     Head: Normocephalic and atraumatic.     Right Ear: External ear normal.     Left Ear: External ear normal.  Eyes:     General: No scleral icterus.       Right eye: No discharge.  Left eye: No discharge.     Conjunctiva/sclera: Conjunctivae normal.  Neck:     Thyroid: No thyromegaly.  Cardiovascular:     Rate and Rhythm: Normal rate and regular rhythm.  Pulmonary:     Effort: No respiratory distress.     Breath sounds: Normal breath sounds. No wheezing.  Abdominal:     General: Bowel sounds are normal.     Palpations: Abdomen is soft.     Tenderness: There is no abdominal tenderness.  Musculoskeletal:        General: No swelling or tenderness.     Cervical back: Neck supple. No tenderness.  Lymphadenopathy:     Cervical: No cervical adenopathy.  Skin:    Findings: No erythema or rash.  Neurological:     Mental Status: She is alert.  Psychiatric:        Mood and Affect: Mood normal.        Behavior: Behavior normal.     BP 118/70   Pulse 90   Temp 98 F (36.7 C)   Resp 16   Ht '5\' 6"'  (1.676 m)   Wt 148 lb 3.2 oz (67.2 kg)   SpO2 99%   BMI 23.92 kg/m  Wt Readings from Last 3 Encounters:  10/16/20 148 lb 3.2 oz (67.2 kg)  07/17/20 154 lb (69.9 kg)  04/14/20 154 lb (69.9 kg)     Lab Results  Component Value Date   WBC 10.7 (H) 10/16/2020   HGB 12.0 10/16/2020   HCT 36.6 10/16/2020   PLT 251.0 10/16/2020   GLUCOSE 154 (H) 10/16/2020   CHOL 124 10/16/2020   TRIG 103.0 10/16/2020   HDL 44.20 10/16/2020   LDLCALC 59 10/16/2020   ALT 12 10/16/2020   AST 18 10/16/2020   NA 139 10/16/2020   K 4.5 10/16/2020   CL 102 10/16/2020   CREATININE 1.22 (H)  10/16/2020   BUN 37 (H) 10/16/2020   CO2 27 10/16/2020   TSH 1.11 10/16/2020   HGBA1C 6.4 10/16/2020   MICROALBUR 8.8 (H) 07/10/2019    DG Chest 2 View  Result Date: 03/14/2018 CLINICAL DATA:  Short of breath, weakness, low oxygen saturation EXAM: CHEST - 2 VIEW COMPARISON:  Chest x-ray of 01/27/2018 FINDINGS: No active infiltrate or effusion is seen. There is some peribronchial thickening which may indicate bronchitis. Also somewhat prominent interstitial markings are present at the lung bases consistent with fibrotic change. Mediastinal and hilar contours are unremarkable and the heart is within normal limits in size. No acute bony abnormality is seen IMPRESSION: 1. No active lung disease. 2. Changes of bronchitis and probable pulmonary fibrosis at the lung bases. . Electronically Signed   By: Ivar Drape M.D.   On: 03/14/2018 15:40       Assessment & Plan:   Problem List Items Addressed This Visit    Chronic obstructive airway disease with asthma ([place])    Continue advair.  Breathing overall stable.  Follow.       CKD (chronic kidney disease) stage 3, GFR 30-59 ml/min (HCC)    Avoid antiinflammatories.  Stay hydrated.  Follow metabolic panel.       Diabetes mellitus with peripheral vascular disease ([place])    On no medication.  Follow met b and a1c.        Relevant Orders   Hemoglobin A1c (Completed)   Microalbumin / creatinine urine ratio   GERD (gastroesophageal reflux disease)    No upper symptoms.  Controlled on protonix.  History of frequent urinary tract infections    Previously evaluated by urology.  Stable.  Follow.       Hypercholesterolemia    Continue simvastatin. Follow lipid panel and liver function tests.        Relevant Orders   Hepatic function panel (Completed)   TSH (Completed)   Lipid panel (Completed)   Hypertension - Primary    Blood pressure doing well on no medication.  Follow pressures.  Follow metabolic panel.       Relevant Orders    CBC with Differential/Platelet (Completed)   Basic metabolic panel (Completed)   Mixed Alzheimer's and vascular dementia ([place])    Evaluated by neurology.  Off aricept for approximately three weeks.  Intolerant - vivid dreams.  Follow.       Muscular deconditioning    Weakness.  Difficulty getting up, etc.  Weight loss.  Discussed palliative care.       Relevant Orders   Amb Referral to Palliative Care   Peripheral vascular disease ([place])    Has known varying pressures in each arm.  Continue simvastatin.  No pain.  Follow.       Weakness    Palliative care as outlined.       Relevant Orders   Amb Referral to Palliative Care   Weight loss    Continue to encourage increased po intake - nutritional shakes.  Follow.  Check routine labs.        Other Visit Diagnoses    B12 deficiency       Relevant Medications   cyanocobalamin ((VITAMIN B-12)) injection 1,000 mcg (Completed)       Einar Pheasant, MD

## 2020-10-17 LAB — CBC WITH DIFFERENTIAL/PLATELET
Basophils Absolute: 0.1 10*3/uL (ref 0.0–0.1)
Basophils Relative: 1.2 % (ref 0.0–3.0)
Eosinophils Absolute: 0.1 10*3/uL (ref 0.0–0.7)
Eosinophils Relative: 1.2 % (ref 0.0–5.0)
HCT: 36.6 % (ref 36.0–46.0)
Hemoglobin: 12 g/dL (ref 12.0–15.0)
Lymphocytes Relative: 20.6 % (ref 12.0–46.0)
Lymphs Abs: 2.2 10*3/uL (ref 0.7–4.0)
MCHC: 32.9 g/dL (ref 30.0–36.0)
MCV: 92.7 fl (ref 78.0–100.0)
Monocytes Absolute: 0.8 10*3/uL (ref 0.1–1.0)
Monocytes Relative: 7 % (ref 3.0–12.0)
Neutro Abs: 7.5 10*3/uL (ref 1.4–7.7)
Neutrophils Relative %: 70 % (ref 43.0–77.0)
Platelets: 251 10*3/uL (ref 150.0–400.0)
RBC: 3.94 Mil/uL (ref 3.87–5.11)
RDW: 16.9 % — ABNORMAL HIGH (ref 11.5–15.5)
WBC: 10.7 10*3/uL — ABNORMAL HIGH (ref 4.0–10.5)

## 2020-10-17 LAB — LIPID PANEL
Cholesterol: 124 mg/dL (ref 0–200)
HDL: 44.2 mg/dL (ref >39.00)
LDL Cholesterol: 59 mg/dL (ref 0–99)
NonHDL: 80.09
Total CHOL/HDL Ratio: 3
Triglycerides: 103 mg/dL (ref 0.0–149.0)
VLDL: 20.6 mg/dL (ref 0.0–40.0)

## 2020-10-17 LAB — HEPATIC FUNCTION PANEL
ALT: 12 U/L (ref 0–35)
AST: 18 U/L (ref 0–37)
Albumin: 3.4 g/dL — ABNORMAL LOW (ref 3.5–5.2)
Alkaline Phosphatase: 65 U/L (ref 39–117)
Bilirubin, Direct: 0.1 mg/dL (ref 0.0–0.3)
Total Bilirubin: 0.3 mg/dL (ref 0.2–1.2)
Total Protein: 6 g/dL (ref 6.0–8.3)

## 2020-10-17 LAB — BASIC METABOLIC PANEL
BUN: 37 mg/dL — ABNORMAL HIGH (ref 6–23)
CO2: 27 mEq/L (ref 19–32)
Calcium: 9.7 mg/dL (ref 8.4–10.5)
Chloride: 102 mEq/L (ref 96–112)
Creatinine, Ser: 1.22 mg/dL — ABNORMAL HIGH (ref 0.40–1.20)
GFR: 38.68 mL/min — ABNORMAL LOW (ref >60.00)
Glucose, Bld: 154 mg/dL — ABNORMAL HIGH (ref 70–99)
Potassium: 4.5 mEq/L (ref 3.5–5.1)
Sodium: 139 mEq/L (ref 135–145)

## 2020-10-17 LAB — TSH: TSH: 1.11 u[IU]/mL (ref 0.35–4.50)

## 2020-10-17 LAB — HEMOGLOBIN A1C: Hgb A1c MFr Bld: 6.4 % (ref 4.6–6.5)

## 2020-10-20 ENCOUNTER — Other Ambulatory Visit: Payer: Self-pay | Admitting: Internal Medicine

## 2020-10-20 ENCOUNTER — Encounter: Payer: Self-pay | Admitting: Internal Medicine

## 2020-10-20 DIAGNOSIS — R944 Abnormal results of kidney function studies: Secondary | ICD-10-CM

## 2020-10-20 NOTE — Assessment & Plan Note (Signed)
On no medication.  Follow met b and a1c.  

## 2020-10-20 NOTE — Assessment & Plan Note (Signed)
Avoid antiinflammatories.  Stay hydrated.  Follow metabolic panel.   

## 2020-10-20 NOTE — Progress Notes (Signed)
Order placed for f/u met b 

## 2020-10-20 NOTE — Assessment & Plan Note (Signed)
Continue to encourage increased po intake - nutritional shakes.  Follow.  Check routine labs.

## 2020-10-20 NOTE — Assessment & Plan Note (Signed)
No upper symptoms.  Controlled on protonix.  

## 2020-10-20 NOTE — Assessment & Plan Note (Signed)
Palliative care as outlined.

## 2020-10-20 NOTE — Assessment & Plan Note (Signed)
Has known varying pressures in each arm.  Continue simvastatin.  No pain.  Follow.  

## 2020-10-20 NOTE — Assessment & Plan Note (Signed)
Blood pressure doing well on no medication.  Follow pressures.  Follow metabolic panel.   

## 2020-10-20 NOTE — Assessment & Plan Note (Signed)
Evaluated by neurology.  Off aricept for approximately three weeks.  Intolerant - vivid dreams.  Follow.

## 2020-10-20 NOTE — Assessment & Plan Note (Signed)
Previously evaluated by urology.  Stable.  Follow.

## 2020-10-20 NOTE — Assessment & Plan Note (Signed)
Continue advair.  Breathing overall stable.  Follow.

## 2020-10-20 NOTE — Assessment & Plan Note (Signed)
Continue simvastatin.  Follow lipid panel and liver function tests.   

## 2020-10-20 NOTE — Assessment & Plan Note (Signed)
Weakness.  Difficulty getting up, etc.  Weight loss.  Discussed palliative care.

## 2020-10-21 ENCOUNTER — Encounter: Payer: Self-pay | Admitting: Internal Medicine

## 2020-10-21 ENCOUNTER — Other Ambulatory Visit: Payer: Self-pay | Admitting: *Deleted

## 2020-10-21 DIAGNOSIS — R944 Abnormal results of kidney function studies: Secondary | ICD-10-CM

## 2020-10-21 NOTE — Telephone Encounter (Signed)
Please call Mona.  I placed an order for palliative care.  Is she wanting me to change this to Hospice.  I have not problem with this, I just wanted to clarify what she wants.

## 2020-10-22 ENCOUNTER — Telehealth: Payer: Self-pay | Admitting: Primary Care

## 2020-10-22 NOTE — Telephone Encounter (Signed)
Palliative care is ok with [facility]. Does not feel that Hospice is needed at the moment but will see what they think when they come out to evaluate her. Advised that someone should be contacting them.

## 2020-10-22 NOTE — Telephone Encounter (Signed)
Spoke with patient's daughter, Aniyha Tate, regarding the Palliative referral/services and all questions were answered and she was in agreement with starting services.  I have scheduled an In-home Consult for 10/30/20 @ 11 AM.

## 2020-10-23 ENCOUNTER — Other Ambulatory Visit: Payer: Self-pay | Admitting: Internal Medicine

## 2020-10-23 MED ORDER — LEVOTHYROXINE SODIUM 50 MCG PO TABS: 50.0000 ug | ORAL_TABLET | Freq: Every day | ORAL | 0 refills | Status: DC

## 2020-10-23 MED ORDER — ALLOPURINOL 100 MG PO TABS: ORAL_TABLET | ORAL | 0 refills | Status: DC

## 2020-10-30 ENCOUNTER — Other Ambulatory Visit: Payer: Self-pay

## 2020-10-30 ENCOUNTER — Other Ambulatory Visit: Payer: Medicare Other | Admitting: Primary Care

## 2020-10-30 DIAGNOSIS — Z515 Encounter for palliative care: Secondary | ICD-10-CM

## 2020-10-30 DIAGNOSIS — G309 Alzheimer's disease, unspecified: Secondary | ICD-10-CM | POA: Diagnosis not present

## 2020-10-30 DIAGNOSIS — J449 Chronic obstructive pulmonary disease, unspecified: Secondary | ICD-10-CM | POA: Diagnosis not present

## 2020-10-30 DIAGNOSIS — F0280 Dementia in other diseases classified elsewhere without behavioral disturbance: Secondary | ICD-10-CM | POA: Diagnosis not present

## 2020-10-30 DIAGNOSIS — R634 Abnormal weight loss: Secondary | ICD-10-CM | POA: Diagnosis not present

## 2020-10-30 DIAGNOSIS — F0150 Vascular dementia without behavioral disturbance: Secondary | ICD-10-CM | POA: Diagnosis not present

## 2020-10-30 DIAGNOSIS — J4489 Other specified chronic obstructive pulmonary disease: Secondary | ICD-10-CM

## 2020-10-30 NOTE — Progress Notes (Signed)
[facility] Pall[facility] Consult Note Telephone: [phone]  Fax: [phone]    Date of encounter: 10/30/20 PATIENT NAME: Diamond Newman [place] [address]   [phone] (home)  DOB: [birth year] MRN: [id number] PRIMARY CARE PROVIDER:    Einar Pheasant, MD,  [address] [place] [id number] [phone]  REFERRING PROVIDER:   Einar Pheasant, [address] [place],   [id number] [phone]  RESPONSIBLE PARTY:    Contact Information     Name Relation Home Work [place] Daughter [phone]  [phone]   [place]lem[place], General [phone]          I met face to face with patient and family in  home. Palliative Care was asked to follow this patient by consultation request of  Einar Pheasant, MD to address advance care planning and complex medical decision making. This is the initial visit.                                     ASSESSMENT AND PLAN / RECOMMENDATIONS:   Advance Care Planning/Goals of Care: Goals include to maximize quality of life and symptom management. Our advance care planning conversation included a discussion about:    The value and importance of advance care planning  Exploration of personal, cultural or spiritual beliefs that might influence medical decisions  Exploration of goals of care in the event of a sudden injury or illness  Identification ff a healthcare agent Daughter Review  and creation of an  advance directive document . Decision not to resuscitate  CODE STATUS: DNR, uploaded to [facility], MOST reviewed and left for them to discuss, will complete on next visit. We discussed advance care planning. They have gotten a will, durable power of attorney and health care power of attorney done. Patient has also done a Living will advance care directive. We discussed the MOST form in the context of translating legal choices into medical directives.  I outlined the various components of the MOST form and we discussed the level of intervention they might want. They say that definitely her quality of life is declining and they have chosen a DNR in keeping with her wishes. However they will discuss extent of  medical intervention and we will complete the MOST form on the next visit.    Symptom Management/Plan:  Immobility: daughter states she could walk fairly well until recently whereby she's needing the transport wheelchair more and having trouble initiating movement. They also have stopped aricept due to vivid dreams. We discussed possible Parkinson's due to neurology's consideration that her dementia may be Lewy body. However she also has had some tremors although these seem to be essential, due to  their very long ago onset and non- pill rolling nature. Discussed fall risk as well as increased caregiver strain as mobility declines.   Mobility: Increased tremors, less mobility. Recently stopped aricept.  Diminishing returns for PT. Uses walker some but use transit chair some.   Dementia: D/c aricept due to vivid dreams, but with increased immobility.Has had some weight loss (5%) and FAST score is 7a. We discussed the progression of dementia whereby the body ceases to be able to know how to eat or to absorb food from the gut. She may be at this level or may be having trouble with swallowing. I also suggested high calorie supplementation such as Boost high  calorie. Denies behavior disturbances ie agitation but often asks to go home (living in her home of many decades).  Caregiver strain: Her daughter and son have been providing care in the home for several year,  taking turns with who stays overnight. They also have hired caregiving. Daughter outlines recent changes in patient's function, specifically that it is harder for  her to ambulate and transfer, not being able to put her full weight on her legs to transfer. She also has not eaten as well  and  has lost  5% weight  in the past four months. Siblings are looking for some direction for caregiving, end of life.   We discussed various levels of dementia care available from hiring at home, family providing support, fee for service, assisted memory care versus long-term care. At her level of function she would be appropriate for long-term care. We discussed methods of  payment for these services either from savings or liquidation of assets, and Medicaid application once these are gone.  Cough/Dyspnea: patient has been diagnosed with COPD although she has never smoked herself. Daughter states this may be due to  secondhand smoke. She is currently managed on some medications and inhalers. She's using Mucinex for productive cough and has other over-the-counter preparations. Some of this production may be due to  her possible dysphasia and aspiration. See dysphagia below; daughter reports patient intake is declining and that she sometimes does not appear to know what to do with food. She can feed her self some finger foods but is having trouble with utensils. She also does not have much appetite .   Follow up Palliative Care Visit: Palliative care will continue to follow for complex medical decision making, advance care planning, and clarification of goals. Return 6 weeks or prn.  I spent 75 minutes providing this consultation. More than 50% of the time in this consultation was spent in counseling and care coordination.  PPS: 30%  HOSPICE ELIGIBILITY/DIAGNOSIS: TBD  Chief Complaint: debility  HISTORY OF PRESENT ILLNESS:  Diamond Newman is a 87 y.o. year old female  with debility, COPD, mixed dementia, bradykinesia. She has recently declined in function from 40% to 30 % PPS, and has begun to lose some weight 5%. Family would like direction with care management and end of life care .   History obtained from review of EMR, discussion with primary team, and interview with family, facility staff/caregiver  and/or Ms. Sangster.  I reviewed available labs, medications, imaging, studies and related documents from the EMR.  Records reviewed and summarized above.   ROS/family    General: NAD ENMT:  may report dysphagia Cardiovascular: denies chest pain, denies DOE Pulmonary: endorses prod. cough, denies increased SOB Abdomen: endorses fair to poor  appetite, denies constipation, endorses inontinence of bowel GU: denies dysuria, endorses incontinence of urine MSK:  endorses  weakness,  no falls reported Skin: denies rashes or wounds Neurological: denies pain, denies insomnia Psych: Endorses positive mood, poor memory Heme/lymph/immuno: denies bruises, abnormal bleeding  Physical Exam: Current and past weights 5% weight loss Constitutional: NAD, 106/73 HR 94 RR 18 General: frail appearing, thin EYES: anicteric sclera, lids intact, no discharge  ENMT: intact hearing, oral mucous membranes moist, most dentition intact CV: S1S2, RRR, no LE edema Pulmonary: LCTA, scattered  small wheezes,  no increased work of breathing, no cough, room air Abdomen: intake 50%, no ascites GU: deferred MSK: + sarcopenia, moves all extremities, non ambulatory , transfers on occasion Skin: warm and dry, no  rashes or wounds on visible skin Neuro:  ++ generalized weakness,  ++ cognitive impairment Psych: non-anxious affect, A and O x 1-2 Hem/lymph/immuno: no widespread bruising   CURRENT PROBLEM LIST:  Patient Active Problem List   Diagnosis Date Noted   Constipation 07/18/2020   Abdominal pain 07/18/2020   Weight loss 01/20/2020   Weakness 10/22/2019   Mixed Alzheimer's and vascular dementia ([place]) 10/21/2019   Confusion 02/22/2019   Sleep apnea 09/27/2016   Bronchitis 07/06/2016   Daytime somnolence 08/03/2015   Fall against object 12/30/2014   CKD (chronic kidney disease) stage 3, GFR 30-59 ml/min (HCC) 11/24/2014   Abnormal mammogram 01/25/2014   Muscular deconditioning 12/03/2013   Leukocytosis  11/27/2013   Chronic obstructive airway disease with asthma ([place]) 08/14/2013   Lower extremity edema 08/05/2013   Cough 04/22/2013   Disorder of kidney and ureter 07/28/2012   Urinary urgency 07/28/2012   Vitamin B12 deficiency 07/18/2012   Osteopenia 05/27/2012   Vitamin D deficiency 05/27/2012   Hypertension 05/25/2012   Hypercholesterolemia 05/25/2012   Peripheral vascular disease ([place]) 05/25/2012   History of frequent urinary tract infections 05/25/2012   GERD (gastroesophageal reflux disease) 05/25/2012   Diabetes mellitus with peripheral vascular disease ([place]) 05/25/2012   Gout 05/25/2012   Incontinence of feces 06/03/2011   Mixed urge and stress incontinence 03/22/2011   Atrophic vaginitis 03/22/2011   PAST MEDICAL HISTORY:  Active Ambulatory Problems    Diagnosis Date Noted   Hypertension 05/25/2012   Hypercholesterolemia 05/25/2012   Peripheral vascular disease ([place]) 05/25/2012   History of frequent urinary tract infections 05/25/2012   GERD (gastroesophageal reflux disease) 05/25/2012   Diabetes mellitus with peripheral vascular disease ([place]) 05/25/2012   Gout 05/25/2012   Osteopenia 05/27/2012   Vitamin D deficiency 05/27/2012   Vitamin B12 deficiency 07/18/2012   Cough 04/22/2013   Lower extremity edema 08/05/2013   Chronic obstructive airway disease with asthma ([place]) 08/14/2013   Leukocytosis 11/27/2013   Muscular deconditioning 12/03/2013   Abnormal mammogram 01/25/2014   CKD (chronic kidney disease) stage 3, GFR 30-59 ml/min (HCC) 11/24/2014   Fall against object 12/30/2014   Daytime somnolence 08/03/2015   Bronchitis 07/06/2016   Sleep apnea 09/27/2016   Disorder of kidney and ureter 07/28/2012   Incontinence of feces 06/03/2011   Mixed urge and stress incontinence 03/22/2011   Atrophic vaginitis 03/22/2011   Urinary urgency 07/28/2012   Confusion 02/22/2019   Mixed Alzheimer's and vascular dementia ([place]) 10/21/2019   Weakness 10/22/2019   Weight  loss 01/20/2020   Constipation 07/18/2020   Abdominal pain 07/18/2020   Resolved Ambulatory Problems    Diagnosis Date Noted   Obesity (BMI 30.0-34.9) 10/28/2013   Acute respiratory failure with hypoxia ([place]) 07/06/2016   Sepsis ([place]) 11/10/2013   Past Medical History:  Diagnosis Date   Asthma    Chronic bronchitis (HCC)    COPD (chronic obstructive pulmonary disease) ([place])    Depression    Diabetes mellitus ([place])    Hematuria    History of colon polyps    Humeral fracture 9/09   Hypothyroidism    SOCIAL HX:  Social History   Tobacco Use   Smoking status: Never   Smokeless tobacco: Never  Substance Use Topics   Alcohol use: No    Alcohol/week: 0.0 standard drinks   FAMILY HX:  Family History  Problem Relation Age of Onset   Hypertension Father    CVA Father    CAD Father    Hypertension Mother  Colitis Mother    CAD Mother    Breast cancer Neg Hx    Colon cancer Neg Hx       ALLERGIES:  Allergies  Allergen Reactions   Ace Inhibitors Cough   Amoxicillin Rash   Ciprofloxacin Hcl Rash   Codeine Rash   Sulfa Antibiotics Other (See Comments)    GI upset   Tetracyclines & Related Rash   Vibramycin [Doxycycline Calcium] Rash     PERTINENT MEDICATIONS:  Outpatient Encounter Medications as of 10/30/2020  Medication Sig   acetaminophen (TYLENOL) 325 MG tablet Take 650 mg by mouth every 6 (six) hours as needed.   ADVAIR DISKUS 500-50 MCG/ACT AEPB Inhale 1 puff into the lungs 2 (two) times daily.   albuterol (VENTOLIN HFA) 108 (90 Base) MCG/ACT inhaler Inhale 2 puffs into the lungs every 6 (six) hours as needed for wheezi ng or shortness of breath.   allopurinol (ZYLOPRIM) 100 MG tablet One tab PO daily   Calcium Carbonate-Vitamin D 600-400 MG-UNIT tablet Take 1 tablet by mouth daily.    cetirizine (ZYRTEC) 10 MG tablet Take 10 mg by mouth daily.   Cholecalciferol (VITAMIN D-3) 1000 UNITS CAPS Take 1 capsule by mouth daily.   Cholecalciferol 25 MCG (1000 UT)  tablet Take 1,000 Units by mouth daily.   Cranberry-Vitamin C-Vitamin E (CRANBERRY PLUS VITAMIN C) 4200-20-3 MG-MG-UNIT CAPS Take 1 capsule by mouth daily.   FLUoxetine (PROZAC) 40 MG capsule Take 1 capsule (40 mg total) by mouth daily.   fluticasone (FLONASE) 50 MCG/ACT nasal spray Place 1 spray into both nostrils daily.   guaiFENesin (MUCINEX) 600 MG 12 hr tablet Take by mouth 2 (two) times daily as needed.   levothyroxine (SYNTHROID) 50 MCG tablet Take 1 tablet (50 mcg total) by mouth daily before breakfast.   methenamine (HIPREX) 1 g tablet Take 1 tablet (1 g total) by mouth 2 (two) times daily with a meal.   montelukast (SINGULAIR) 10 MG tablet Take 1 tablet (10 mg total) by mouth at bedtime.   nystatin (MYCOSTATIN/NYSTOP) powder Apply topically 2 (two) times daily.   nystatin cream (MYCOSTATIN) Apply 1 application topically 2 (two) times daily.   pantoprazole (PROTONIX) 40 MG tablet Take 1 tablet (40 mg total) by mouth daily.   simvastatin (ZOCOR) 20 MG tablet Take 1 tablet (20 mg total) by mouth daily.   tolterodine (DETROL) 2 MG tablet Take 1 tablet (2 mg total) by mouth 2 (two) times daily.   [DISCONTINUED] Cranberry-Cholecalciferol 4200-500 MG-UNIT CAPS Take 1 capsule by mouth daily.    No facility-administered encounter medications on file as of 10/30/2020.    Thank you for the opportunity to participate in the care of Ms. Coupland.  The palliative care team will continue to follow. Please call our office at [phone] if we can be of additional assistance.   Jason Coop, NP , DNP, MPH, AGPCNP-BC, ACHPN  COVID-19 PATIENT SCREENING TOOL Asked and negative response unless otherwise noted:   Have you had symptoms of covid, tested positive or been in contact with someone with symptoms/positive test in the past 5-10 days?

## 2020-10-31 ENCOUNTER — Other Ambulatory Visit: Payer: Self-pay | Admitting: Internal Medicine

## 2020-10-31 MED ORDER — METHENAMINE HIPPURATE 1 G PO TABS: 1.0000 g | ORAL_TABLET | Freq: Two times a day (BID) | ORAL | 0 refills | Status: DC

## 2020-10-31 MED ORDER — FLUOXETINE HCL 40 MG PO CAPS: 40.0000 mg | ORAL_CAPSULE | Freq: Every day | ORAL | 0 refills | Status: DC

## 2020-11-05 ENCOUNTER — Other Ambulatory Visit: Payer: Self-pay | Admitting: Internal Medicine

## 2020-11-05 MED ORDER — FLUTICASONE-SALMETEROL 500-50 MCG/ACT IN AEPB: 1.0000 | INHALATION_SPRAY | Freq: Two times a day (BID) | RESPIRATORY_TRACT | 0 refills | Status: DC

## 2020-11-12 ENCOUNTER — Other Ambulatory Visit: Payer: Self-pay | Admitting: Internal Medicine

## 2020-11-12 MED ORDER — PANTOPRAZOLE SODIUM 40 MG PO TBEC: 40.0000 mg | DELAYED_RELEASE_TABLET | Freq: Every day | ORAL | 0 refills | Status: DC

## 2020-11-13 ENCOUNTER — Other Ambulatory Visit: Payer: Self-pay | Admitting: Internal Medicine

## 2020-11-17 ENCOUNTER — Ambulatory Visit: Payer: Medicare Other

## 2020-11-18 ENCOUNTER — Emergency Department: Payer: Medicare Other

## 2020-11-18 ENCOUNTER — Ambulatory Visit: Payer: Medicare Other

## 2020-11-18 ENCOUNTER — Other Ambulatory Visit: Payer: Medicare Other

## 2020-11-18 ENCOUNTER — Emergency Department
Admission: EM | Admit: 2020-11-18 | Discharge: 2020-11-18 | Disposition: A | Discharge status: Home / Self Care | Payer: Medicare Other | Attending: Emergency Medicine | Admitting: Emergency Medicine

## 2020-11-18 ENCOUNTER — Encounter: Payer: Self-pay | Admitting: Emergency Medicine

## 2020-11-18 ENCOUNTER — Other Ambulatory Visit: Payer: Self-pay

## 2020-11-18 DIAGNOSIS — N3000 Acute cystitis without hematuria: Secondary | ICD-10-CM | POA: Diagnosis not present

## 2020-11-18 DIAGNOSIS — J189 Pneumonia, unspecified organism: Secondary | ICD-10-CM | POA: Diagnosis not present

## 2020-11-18 DIAGNOSIS — J45909 Unspecified asthma, uncomplicated: Secondary | ICD-10-CM | POA: Diagnosis not present

## 2020-11-18 DIAGNOSIS — J449 Chronic obstructive pulmonary disease, unspecified: Secondary | ICD-10-CM | POA: Insufficient documentation

## 2020-11-18 DIAGNOSIS — Z79899 Other long term (current) drug therapy: Secondary | ICD-10-CM | POA: Diagnosis not present

## 2020-11-18 DIAGNOSIS — N183 Chronic kidney disease, stage 3 unspecified: Secondary | ICD-10-CM | POA: Diagnosis not present

## 2020-11-18 DIAGNOSIS — M47812 Spondylosis without myelopathy or radiculopathy, cervical region: Secondary | ICD-10-CM | POA: Diagnosis not present

## 2020-11-18 DIAGNOSIS — Z20822 Contact with and (suspected) exposure to covid-19: Secondary | ICD-10-CM | POA: Diagnosis not present

## 2020-11-18 DIAGNOSIS — J168 Pneumonia due to other specified infectious organisms: Secondary | ICD-10-CM | POA: Diagnosis not present

## 2020-11-18 DIAGNOSIS — Z7951 Long term (current) use of inhaled steroids: Secondary | ICD-10-CM | POA: Diagnosis not present

## 2020-11-18 DIAGNOSIS — R0682 Tachypnea, not elsewhere classified: Secondary | ICD-10-CM | POA: Insufficient documentation

## 2020-11-18 DIAGNOSIS — G309 Alzheimer's disease, unspecified: Secondary | ICD-10-CM | POA: Diagnosis not present

## 2020-11-18 DIAGNOSIS — F0390 Unspecified dementia without behavioral disturbance: Secondary | ICD-10-CM

## 2020-11-18 DIAGNOSIS — E039 Hypothyroidism, unspecified: Secondary | ICD-10-CM | POA: Insufficient documentation

## 2020-11-18 DIAGNOSIS — R0689 Other abnormalities of breathing: Secondary | ICD-10-CM | POA: Diagnosis not present

## 2020-11-18 DIAGNOSIS — I10 Essential (primary) hypertension: Secondary | ICD-10-CM | POA: Diagnosis not present

## 2020-11-18 DIAGNOSIS — R55 Syncope and collapse: Secondary | ICD-10-CM | POA: Diagnosis not present

## 2020-11-18 DIAGNOSIS — R42 Dizziness and giddiness: Secondary | ICD-10-CM | POA: Diagnosis not present

## 2020-11-18 DIAGNOSIS — E1122 Type 2 diabetes mellitus with diabetic chronic kidney disease: Secondary | ICD-10-CM | POA: Diagnosis not present

## 2020-11-18 DIAGNOSIS — F0280 Dementia in other diseases classified elsewhere without behavioral disturbance: Secondary | ICD-10-CM | POA: Diagnosis not present

## 2020-11-18 DIAGNOSIS — I129 Hypertensive chronic kidney disease with stage 1 through stage 4 chronic kidney disease, or unspecified chronic kidney disease: Secondary | ICD-10-CM | POA: Insufficient documentation

## 2020-11-18 DIAGNOSIS — G319 Degenerative disease of nervous system, unspecified: Secondary | ICD-10-CM | POA: Diagnosis not present

## 2020-11-18 DIAGNOSIS — I517 Cardiomegaly: Secondary | ICD-10-CM | POA: Diagnosis not present

## 2020-11-18 DIAGNOSIS — I4891 Unspecified atrial fibrillation: Secondary | ICD-10-CM | POA: Diagnosis not present

## 2020-11-18 DIAGNOSIS — M4313 Spondylolisthesis, cervicothoracic region: Secondary | ICD-10-CM | POA: Diagnosis not present

## 2020-11-18 LAB — HEPATIC FUNCTION PANEL
ALT: 14 U/L (ref 0–44)
AST: 28 U/L (ref 15–41)
Albumin: 2.8 g/dL — ABNORMAL LOW (ref 3.5–5.0)
Alkaline Phosphatase: 60 U/L (ref 38–126)
Bilirubin, Direct: <0.1 mg/dL (ref 0.0–0.2)
Total Bilirubin: 0.6 mg/dL (ref 0.3–1.2)
Total Protein: 6 g/dL — ABNORMAL LOW (ref 6.5–8.1)

## 2020-11-18 LAB — BASIC METABOLIC PANEL
Anion gap: 7 (ref 5–15)
BUN: 25 mg/dL — ABNORMAL HIGH (ref 8–23)
CO2: 24 mmol/L (ref 22–32)
Calcium: 9 mg/dL (ref 8.9–10.3)
Chloride: 108 mmol/L (ref 98–111)
Creatinine, Ser: 1.18 mg/dL — ABNORMAL HIGH (ref 0.44–1.00)
GFR, Estimated: 43 mL/min — ABNORMAL LOW (ref >60)
Glucose, Bld: 170 mg/dL — ABNORMAL HIGH (ref 70–99)
Potassium: 3.9 mmol/L (ref 3.5–5.1)
Sodium: 139 mmol/L (ref 135–145)

## 2020-11-18 LAB — URINALYSIS, COMPLETE (UACMP) WITH MICROSCOPIC
Bilirubin Urine: NEGATIVE
Glucose, UA: NEGATIVE mg/dL
Hgb urine dipstick: NEGATIVE
Ketones, ur: NEGATIVE mg/dL
Nitrite: POSITIVE — AB
Protein, ur: 30 mg/dL — AB
Specific Gravity, Urine: 1.014 (ref 1.005–1.030)
Squamous Epithelial / HPF: NONE SEEN (ref 0–5)
WBC, UA: >50 WBC/hpf — ABNORMAL HIGH (ref 0–5)
pH: 6 (ref 5.0–8.0)

## 2020-11-18 LAB — CBC
HCT: 33.6 % — ABNORMAL LOW (ref 36.0–46.0)
Hemoglobin: 10.9 g/dL — ABNORMAL LOW (ref 12.0–15.0)
MCH: 30.8 pg (ref 26.0–34.0)
MCHC: 32.4 g/dL (ref 30.0–36.0)
MCV: 94.9 fL (ref 80.0–100.0)
Platelets: 244 10*3/uL (ref 150–400)
RBC: 3.54 MIL/uL — ABNORMAL LOW (ref 3.87–5.11)
RDW: 15.4 % (ref 11.5–15.5)
WBC: 9 10*3/uL (ref 4.0–10.5)
nRBC: 0 % (ref 0.0–0.2)

## 2020-11-18 LAB — RESP PANEL BY RT-PCR (FLU A&B, COVID) ARPGX2
Influenza A by PCR: NEGATIVE
Influenza B by PCR: NEGATIVE
SARS Coronavirus 2 by RT PCR: NEGATIVE

## 2020-11-18 LAB — D-DIMER, QUANTITATIVE: D-Dimer, Quant: 2.74 ug/mL-FEU — ABNORMAL HIGH (ref 0.00–0.50)

## 2020-11-18 LAB — MAGNESIUM: Magnesium: 1.7 mg/dL (ref 1.7–2.4)

## 2020-11-18 LAB — TROPONIN I (HIGH SENSITIVITY): Troponin I (High Sensitivity): 15 ng/L (ref <18)

## 2020-11-18 MED ORDER — CEFPODOXIME PROXETIL 200 MG PO TABS: 200.0000 mg | ORAL_TABLET | Freq: Two times a day (BID) | ORAL | 0 refills | Status: AC

## 2020-11-18 MED ORDER — SODIUM CHLORIDE 0.9 % IV SOLN
1.0000 g | Freq: Once | INTRAVENOUS | Status: AC
  Administered 2020-11-18: 1 g via INTRAVENOUS
  Filled 2020-11-18: qty 10

## 2020-11-18 MED ORDER — IOHEXOL 350 MG/ML SOLN
60.0000 mL | Freq: Once | INTRAVENOUS | Status: AC | PRN
  Administered 2020-11-18: 60 mL via INTRAVENOUS

## 2020-11-18 NOTE — ED Provider Notes (Signed)
[facility] Emergency Department Provider Note  ____________________________________________   Event Date/Time   First MD Initiated Contact with Patient 11/18/20 1428     (approximate)  I have reviewed the triage vital signs and the nursing notes.   HISTORY  Chief Complaint Loss of Consciousness   HPI Diamond Newman is a 87 y.o. female with past medical history of asthma, COPD, depression, DM, GERD, HTN, HDL, hypothyroidism, frequent urinary tract infections and Lewy body Alzheimer's dementia living at home with home health care who presents accompanied by daughter for evaluation of some dizziness and weakness.  Patient is a somewhat poor historian but per daughter who has been with patient of the last couple days patient will alternate days where she will spend most time in bed will sometimes will ambulate with a walker with assistance.  Today when she was on the toilet she states she felt dizzy multiple times and could not get up for some time so EMS was called.  Home pulse ox showed patient's heart rate slightly low at 100 and her SPO2 in the 70s but she was not hypoxic with EMS.  Daughter thinks she may have fallen a week or 2 ago but otherwise has not had any recent falls, cough, vomiting, diarrhea or other clear sick symptoms although she seemed more tired and had loss of appetite yesterday.  No other acute concerns at this time.         Past Medical History:  Diagnosis Date   Asthma    Chronic bronchitis (HCC)    COPD (chronic obstructive pulmonary disease) (HCC)    uses inhalers   Depression    Diabetes mellitus (HCC)    GERD (gastroesophageal reflux disease)    hiatal hernia   Gout    Hematuria    followed by urology   History of colon polyps    History of frequent urinary tract infections    Humeral fracture 9/09   comminuted impacted proximal   Hypercholesterolemia    Hypertension    Hypothyroidism    Peripheral vascular disease (HCC)     Sleep apnea    does not use cpap as it did not benefit her    Patient Active Problem List   Diagnosis Date Noted   Constipation 07/18/2020   Abdominal pain 07/18/2020   Weight loss 01/20/2020   Weakness 10/22/2019   Mixed Alzheimer's and vascular dementia (HCC) 10/21/2019   Confusion 02/22/2019   Sleep apnea 09/27/2016   Bronchitis 07/06/2016   Daytime somnolence 08/03/2015   Fall against object 12/30/2014   CKD (chronic kidney disease) stage 3, GFR 30-59 ml/min (HCC) 11/24/2014   Abnormal mammogram 01/25/2014   Muscular deconditioning 12/03/2013   Leukocytosis 11/27/2013   Chronic obstructive airway disease with asthma (HCC) 08/14/2013   Lower extremity edema 08/05/2013   Cough 04/22/2013   Disorder of kidney and ureter 07/28/2012   Urinary urgency 07/28/2012   Vitamin B12 deficiency 07/18/2012   Osteopenia 05/27/2012   Vitamin D deficiency 05/27/2012   Hypertension 05/25/2012   Hypercholesterolemia 05/25/2012   Peripheral vascular disease (HCC) 05/25/2012   History of frequent urinary tract infections 05/25/2012   GERD (gastroesophageal reflux disease) 05/25/2012   Diabetes mellitus with peripheral vascular disease (HCC) 05/25/2012   Gout 05/25/2012   Incontinence of feces 06/03/2011   Mixed urge and stress incontinence 03/22/2011   Atrophic vaginitis 03/22/2011    Past Surgical History:  Procedure Laterality Date   BLADDER SUSPENSION  12/08/99   BREAST BIOPSY  Right 1960's   CARPAL TUNNEL RELEASE Right 03/17/2017   Procedure: CARPAL TUNNEL RELEASE;  Surgeon: Kennedy Bucker, MD;  Location: [facility] ORS;  Service: Orthopedics;  Laterality: Right;   CHOLECYSTECTOMY  1994   COLONOSCOPY W/ POLYPECTOMY     EYE SURGERY Bilateral    cataract extractions   KNEE SURGERY Right 2013   arthroscopy   ORIF WRIST FRACTURE Right 03/17/2017   Procedure: OPEN REDUCTION INTERNAL FIXATION (ORIF) WRIST FRACTURE;  Surgeon: Kennedy Bucker, MD;  Location: [facility] ORS;  Service: Orthopedics;   Laterality: Right;    Prior to Admission medications   Medication Sig Start Date End Date Taking? Authorizing Provider  cefpodoxime (VANTIN) 200 MG tablet Take 1 tablet (200 mg total) by mouth 2 (two) times daily for 7 days. 11/18/20 11/25/20 Yes Gilles Chiquito, MD  acetaminophen (TYLENOL) 325 MG tablet Take 650 mg by mouth every 6 (six) hours as needed.    [provider]  albuterol (VENTOLIN HFA) 108 (90 Base) MCG/ACT inhaler Inhale 2 puffs into the lungs every 6 (six) hours as needed for wheezi ng or shortness of breath. 11/22/18   Dale [place], MD  allopurinol (ZYLOPRIM) 100 MG tablet One tab PO daily 10/23/20   Dale [place], MD  Calcium Carbonate-Vitamin D 600-400 MG-UNIT tablet Take 1 tablet by mouth daily.     [provider]  cetirizine (ZYRTEC) 10 MG tablet Take 10 mg by mouth daily.    [provider]  Cholecalciferol (VITAMIN D-3) 1000 UNITS CAPS Take 1 capsule by mouth daily.    [provider]  Cholecalciferol 25 MCG (1000 UT) tablet Take 1,000 Units by mouth daily.    [provider]  Cranberry-Vitamin C-Vitamin E (CRANBERRY PLUS VITAMIN C) 4200-20-3 MG-MG-UNIT CAPS Take 1 capsule by mouth daily.    [provider]  FLUoxetine (PROZAC) 40 MG capsule Take 1 capsule (40 mg total) by mouth daily. 10/31/20   Dale [place], MD  fluticasone (FLONASE) 50 MCG/ACT nasal spray Place 1 spray into both nostrils daily.    [provider]  fluticasone-salmeterol (ADVAIR DISKUS) 500-50 MCG/ACT AEPB Inhale 1 puff into the lungs 2 (two) times daily. 11/05/20   Dale [place], MD  guaiFENesin (MUCINEX) 600 MG 12 hr tablet Take by mouth 2 (two) times daily as needed.    [provider]  levothyroxine (SYNTHROID) 50 MCG tablet Take 1 tablet (50 mcg total) by mouth daily before breakfast. 10/23/20   Dale [place], MD  methenamine (HIPREX) 1 g tablet Take 1 tablet (1 g total) by mouth 2 (two) times daily with a meal. 10/31/20    Dale [place], MD  montelukast (SINGULAIR) 10 MG tablet Take 1 tablet (10 mg total) by mouth at bedtime. 10/31/20   Dale [place], MD  nystatin (MYCOSTATIN/NYSTOP) powder Apply topically 2 (two) times daily. 04/16/20   Dale [place], MD  nystatin cream (MYCOSTATIN) Apply 1 application topically 2 (two) times daily. 04/21/20   Dale [place], MD  pantoprazole (PROTONIX) 40 MG tablet Take 1 tablet (40 mg total) by mouth daily. 11/12/20   Dale [place], MD  simvastatin (ZOCOR) 20 MG tablet Take 1 tablet (20 mg total) by mouth daily. 11/13/20   Dale [place], MD  tolterodine (DETROL) 2 MG tablet Take 1 tablet (2 mg total) by mouth 2 (two) times daily. 09/11/20   Dale , MD    Allergies Ace inhibitors, Amoxicillin, Ciprofloxacin hcl, Codeine, Sulfa antibiotics, Tetracyclines & related, and Vibramycin [doxycycline calcium]  Family History  Problem Relation Age of Onset  Hypertension Father    CVA Father    CAD Father    Hypertension Mother    Colitis Mother    CAD Mother    Breast cancer Neg Hx    Colon cancer Neg Hx     Social History Social History   Tobacco Use   Smoking status: Never   Smokeless tobacco: Never  Vaping Use   Vaping Use: Never used  Substance Use Topics   Alcohol use: No    Alcohol/week: 0.0 standard drinks   Drug use: No    Review of Systems  Review of Systems  Unable to perform ROS: Dementia     ____________________________________________   PHYSICAL EXAM:  VITAL SIGNS: ED Triage Vitals  Enc Vitals Group     BP 11/18/20 1416 133/62     Pulse Rate 11/18/20 1416 87     Resp 11/18/20 1416 (!) 27     Temp 11/18/20 1416 97.7 F (36.5 C)     Temp Source 11/18/20 1416 Axillary     SpO2 11/18/20 1416 100 %     Weight 11/18/20 1429 144 lb 10 oz (65.6 kg)     Height --      Head Circumference --      Peak Flow --      Pain Score --      Pain Loc --      Pain Edu? --      Excl. in GC? --    Vitals:   11/18/20 1613 11/18/20 1802   BP: (!) 150/70 (!) 142/80  Pulse: 85 85  Resp: (!) 28 (!) 28  Temp:  97.8 F (36.6 C)  SpO2: 100% 100%   Physical Exam Vitals and nursing note reviewed.  HENT:     Head: Normocephalic and atraumatic.     Right Ear: External ear normal.     Left Ear: External ear normal.     Nose: Nose normal.     Mouth/Throat:     Mouth: Mucous membranes are moist.  Eyes:     Extraocular Movements: Extraocular movements intact.     Pupils: Pupils are equal, round, and reactive to light.  Cardiovascular:     Rate and Rhythm: Normal rate.     Pulses: Normal pulses.  Pulmonary:     Effort: Pulmonary effort is normal. Tachypnea present.  Abdominal:     General: There is no distension.     Tenderness: There is no abdominal tenderness. There is no right CVA tenderness or left CVA tenderness.  Musculoskeletal:     Cervical back: No rigidity.  Skin:    General: Skin is warm.  Neurological:     Mental Status: She is alert. Mental status is at baseline. She is disoriented and confused.     Cranial Nerves: No cranial nerve deficit.     Motor: Tremor present.  Psychiatric:        Mood and Affect: Mood normal.     ____________________________________________   LABS (all labs ordered are listed, but only abnormal results are displayed)  Labs Reviewed  BASIC METABOLIC PANEL - Abnormal; Notable for the following components:      Result Value   Glucose, Bld 170 (*)    BUN 25 (*)    Creatinine, Ser 1.18 (*)    GFR, Estimated 43 (*)    All other components within normal limits  CBC - Abnormal; Notable for the following components:   RBC 3.54 (*)    Hemoglobin 10.9 (*)  HCT 33.6 (*)    All other components within normal limits  URINALYSIS, COMPLETE (UACMP) WITH MICROSCOPIC - Abnormal; Notable for the following components:   Color, Urine AMBER (*)    APPearance CLOUDY (*)    Protein, ur 30 (*)    Nitrite POSITIVE (*)    Leukocytes,Ua LARGE (*)    WBC, UA >50 (*)    Bacteria, UA MANY (*)     All other components within normal limits  HEPATIC FUNCTION PANEL - Abnormal; Notable for the following components:   Total Protein 6.0 (*)    Albumin 2.8 (*)    All other components within normal limits  D-DIMER, QUANTITATIVE - Abnormal; Notable for the following components:   D-Dimer, Quant 2.74 (*)    All other components within normal limits  RESP PANEL BY RT-PCR (FLU A&B, COVID) ARPGX2  URINE CULTURE  MAGNESIUM  TROPONIN I (HIGH SENSITIVITY)   ____________________________________________  EKG  A. fib with a rate of 86, incomplete right bundle branch block, left anterior fascicle block, QTC of 506 some nonspecific changes throughout. ____________________________________________  RADIOLOGY  ED MD interpretation:  Chest x-ray has no focal consolidation to suggest pneumonia, overt edema suggestive of heart failure or other clear acute intrathoracic process.  CT head shows some generalized atrophy without any evidence of injury, subacute CVA or other clear acute intracranial process.  CT C-spine shows degenerative changes without any acute injury.  CTA chest shows evidence of emphysema and possible pneumonia or aspiration in the right lower lobe.  There is also some dilation of the aorta and aortic atherosclerosis.  No evidence of PE, large effusion, overt edema or other clear acute intrathoracic process.  Official radiology report(s): DG Chest 2 View  Result Date: 11/18/2020 CLINICAL DATA:  Recent syncopal episode EXAM: CHEST - 2 VIEW COMPARISON:  03/14/2018 FINDINGS: Cardiac shadow is stable. Aortic calcifications are again seen. The lungs are clear. Chronic interstitial changes are noted in the bases posteriorly stable from the prior exam. No acute bony abnormality is seen. IMPRESSION: Chronic fibrotic and interstitial changes in the bases posteriorly. No acute abnormality noted. Electronically Signed   By: Alcide Clever M.D.   On: 11/18/2020 15:30   CT Head Wo Contrast  Result  Date: 11/18/2020 CLINICAL DATA:  Syncopal episode and subsequent fall. EXAM: CT HEAD WITHOUT CONTRAST TECHNIQUE: Contiguous axial images were obtained from the base of the skull through the vertex without intravenous contrast. COMPARISON:  November 08, 2013 FINDINGS: Brain: There is mild cerebral atrophy with widening of the extra-axial spaces and ventricular dilatation. There are areas of decreased attenuation within the white matter tracts of the supratentorial brain, consistent with microvascular disease changes. Vascular: No hyperdense vessel or unexpected calcification. Skull: Normal. Negative for fracture or focal lesion. Sinuses/Orbits: No acute finding. Other: None. IMPRESSION: 1. Generalized cerebral atrophy. 2. No acute intracranial abnormality. Electronically Signed   By: Aram Candela M.D.   On: 11/18/2020 18:07   CT Angio Chest PE W and/or Wo Contrast  Result Date: 11/18/2020 CLINICAL DATA:  87 year old female with concern for pulmonary embolism. EXAM: CT ANGIOGRAPHY CHEST WITH CONTRAST TECHNIQUE: Multidetector CT imaging of the chest was performed using the standard protocol during bolus administration of intravenous contrast. Multiplanar CT image reconstructions and MIPs were obtained to evaluate the vascular anatomy. CONTRAST:  70mL OMNIPAQUE IOHEXOL 350 MG/ML SOLN COMPARISON:  Chest radiograph dated 11/18/2020. FINDINGS: Cardiovascular: Borderline cardiomegaly. No pericardial effusion. Three-vessel coronary vascular calcification. There is advanced calcified and noncalcified plaque of the  thoracic aorta. There is borderline dilatation of the ascending aorta measuring 4 cm in greatest axial diameter. The descending thoracic aorta is dilated measuring up to 4.9 cm. There is heterogeneous appearance of the contrast within the descending thoracic aorta, likely secondary to mixing artifact. No periaortic fluid collection. Evaluation of the pulmonary arteries is limited due to respiratory motion  artifact and suboptimal opacification and visualization of the peripheral branches. No pulmonary artery embolus identified. Mediastinum/Nodes: Borderline enlarged right hilar lymph node measures 11 mm in short axis. Subcarinal lymph node measures 13 mm. The esophagus is grossly unremarkable. No mediastinal fluid collection. Lungs/Pleura: Background of emphysema. Patchy area of airspace opacity involving the right lower lobe may represent pneumonia or aspiration. No pleural effusion or pneumothorax. There is mucous content within the right lower lobe bronchus. The central airways are otherwise patent. Upper Abdomen: Cholecystectomy. Musculoskeletal: Osteopenia with degenerative changes of the spine. No acute osseous pathology. Review of the MIP images confirms the above findings. IMPRESSION: 1. No CT evidence of pulmonary embolism. 2. Emphysema with patchy area of airspace opacity involving the right lower lobe may represent pneumonia or aspiration. Clinical correlation and follow-up to resolution recommended. 3. Dilated thoracic aorta measuring up to 4 cm in the ascending aorta and 4.9 cm in descending thoracic aorta. Recommend annual imaging followup by CTA or MRA. This recommendation follows 2010 ACCF/AHA/AATS/ACR/ASA/SCA/SCAI/SIR/STS/SVM Guidelines for the Diagnosis and Management of Patients with Thoracic Aortic Disease. Circulation. 2010; 121: Y782-N562. Aortic aneurysm NOS (ICD10-I71.9) 4. Aortic Atherosclerosis (ICD10-I70.0) and Emphysema (ICD10-J43.9). Electronically Signed   By: Elgie Collard M.D.   On: 11/18/2020 18:00   CT Cervical Spine Wo Contrast  Result Date: 11/18/2020 CLINICAL DATA:  Syncopal episode. EXAM: CT CERVICAL SPINE WITHOUT CONTRAST TECHNIQUE: Multidetector CT imaging of the cervical spine was performed without intravenous contrast. Multiplanar CT image reconstructions were also generated. COMPARISON:  None. FINDINGS: Alignment: Approximately 1 mm anterolisthesis of C7 is noted on  T1. Skull base and vertebrae: No acute fracture. No primary bone lesion or focal pathologic process. Soft tissues and spinal canal: No prevertebral fluid or swelling. No visible canal hematoma. Disc levels: There is mild anterior endplate sclerosis at the level of C3-C4. Marked severity endplate sclerosis is seen at the levels of C4-C5, C5-C6 and C6-C7. Marked severity intervertebral disc space narrowing is seen at the levels of C4-C5, C5-C6 and C6-C7. Bilateral marked severity multilevel facet joint hypertrophy is noted. Upper chest: Negative. Other: None. IMPRESSION: 1. No acute cervical spine fracture. 2. Marked severity multilevel degenerative changes. Electronically Signed   By: Aram Candela M.D.   On: 11/18/2020 18:03    ____________________________________________   PROCEDURES  Procedure(s) performed (including Critical Care):  .1-3 Lead EKG Interpretation  Date/Time: 11/18/2020 4:40 PM Performed by: Gilles Chiquito, MD Authorized by: Gilles Chiquito, MD     Interpretation: non-specific     Rhythm: atrial fibrillation     Ectopy: none     Conduction: normal     ____________________________________________   INITIAL IMPRESSION / ASSESSMENT AND PLAN / ED COURSE      Patient presents with above-stated history and exam for assessment made by daughter after she has some dizziness and more weak than usual while sitting on the toilet earlier today which was associated with some tachycardia and low pulse ox on home pulse oximeter.  No falls last couple days although it seems patient may have fallen last week.  Patient denies any pain but further history is very limited from her secondary to  dementia.  She does not recall exactly what happened earlier today.  On arrival here BP is 150/70 and she is tachypneic at 28 with otherwise stable vital signs on room air.  No obvious trauma on exam and she is confused at baseline with some tremor noted in upper extremities otherwise has a  nonfocal supine exam.  Differential for events this morning include possible metabolic derangements, acute infectious process i.e. UTI or pneumonia, arrhythmia, ACS, anemia, and PE.  Patient denies any headache is noticed head trauma will obtain CT head and C-spine given age and report of recent fall last week with unclear of details of this.  Chest x-ray has no focal consolidation to suggest pneumonia, overt edema suggestive of heart failure or other clear acute intrathoracic process.  D-dimer returned at 2.74.  CTA chest ordered to rule out PE.  CBC with evidence of some mild anemia at 10.9 compared to twelve 1 month ago essentially stable and overall Evalose patient for acute symptomatic anemia.  Troponin nonelevated and overall I have a low suspicion  for ACS.  However ECG shows evidence of appropriately rate controlled A. fib slightly prolonged QTC interval.  On review of records after discussion with daughter patient has no history of this.  While I do not believe patient requires emergent rate control at this time it is possible she had a brief paroxysmal moment that terminated prior to arrival associated with this earlier today.  I also had a lengthy discussion with the daughter at bedside regarding possible initiation of anticoagulation however given patient's age, anemia and risk of falls will defer initiation of this pending outpatient cardiology visit.  I think this is reasonable.  Hepatic function panel unremarkable without evidence of cholestasis or hepatitis.  COVID and flu is negative.  Urinalysis is concerning for cystitis with 30 protein, positive nitrites and large leukocyte esterase with 10-50 WBCs.  Urine culture sent.  Patient given a dose of Ceftin to cover for pneumonia and UTI and Rx written for Vantin.  Urine culture sent.  Patient is no evidence of hypoxic respiratory failure and overall I have a low suspicion for sepsis at this time.  Patient was observed on several reassessments  denied any complaints.  In addition we will monitor was intermittently picking up respiratory rates in the mid 20s on my count patient's history rates are in the mid teens and example if she is tachypneic at this time.   CT head shows some generalized atrophy without any evidence of injury, subacute CVA or other clear acute intracranial process.  CT C-spine shows degenerative changes without any acute injury.  CTA chest shows evidence of emphysema and possible pneumonia or aspiration in the right lower lobe.  There is also some dilation of the aorta and aortic atherosclerosis.  No evidence of PE, large effusion, overt edema or other clear acute intrathoracic process.   Advised patient's daughter of findings of dilated aorta on CT and recommendation for outpatient surveillance imaging as indicated by patient's goals of care.  Overall given stable vitals with patient denying any complaints with her receiving good care from family and home health every day at home I think discharge is reasonable plan for close outpatient cardiology and PCP follow-up.  Discharged stable condition.  Strict return precautions advised and discussed.     ____________________________________________   FINAL CLINICAL IMPRESSION(S) / ED DIAGNOSES  Final diagnoses:  Dizziness  Acute cystitis without hematuria  Dementia without behavioral disturbance, unspecified dementia type (HCC)  Atrial fibrillation,  unspecified type (HCC)  Pneumonia due to infectious organism, unspecified laterality, unspecified part of lung    Medications  cefTRIAXone (ROCEPHIN) 1 g in sodium chloride 0.9 % 100 mL IVPB (0 g Intravenous Stopped 11/18/20 1709)  iohexol (OMNIPAQUE) 350 MG/ML injection 60 mL (60 mLs Intravenous Contrast Given 11/18/20 1718)     ED Discharge Orders          Ordered    cefpodoxime (VANTIN) 200 MG tablet  2 times daily        11/18/20 1848             Note:  This document was prepared using Dragon voice  recognition software and may include unintentional dictation errors.    Gilles ChiquitoSmith, Trease Bremner P, MD 11/18/20 [phone]

## 2020-11-18 NOTE — ED Triage Notes (Signed)
Pt ems from home for syncopal episode while on toilet. Pt with dementia and is poor historian.

## 2020-11-19 ENCOUNTER — Encounter: Payer: Self-pay | Admitting: Primary Care

## 2020-11-19 ENCOUNTER — Encounter: Payer: Self-pay | Admitting: Internal Medicine

## 2020-11-19 NOTE — Telephone Encounter (Signed)
LM for [facility] to confirm doing ok. Please review her records and let me know if we need to work her in for an appt with you.

## 2020-11-19 NOTE — Telephone Encounter (Signed)
Agree with finding out how she is doing.  We will find a spot to work her in.  I can work her in 12:00 on7/15/22, but she may need something prior to that.  Hold for work in if so and I will find a spot.

## 2020-11-20 LAB — URINE CULTURE

## 2020-11-21 ENCOUNTER — Other Ambulatory Visit: Payer: Self-pay | Admitting: Internal Medicine

## 2020-11-21 MED ORDER — LEVOTHYROXINE SODIUM 50 MCG PO TABS: 50.0000 ug | ORAL_TABLET | Freq: Every day | ORAL | 0 refills | Status: DC

## 2020-11-21 MED ORDER — ALLOPURINOL 100 MG PO TABS: ORAL_TABLET | ORAL | 0 refills | Status: DC

## 2020-11-21 NOTE — Telephone Encounter (Signed)
I can see her 12:00 12/09/20 or hold for work in appt.

## 2020-11-21 NOTE — Telephone Encounter (Signed)
Raynelle Fanning says that patient is doing significantly better. Does not need to be seen sooner than 7/15 but this time does not work for her. Rachel Bo that I would look over the schedule and find another place to work in. Raynelle Fanning was ok with waiting.

## 2020-11-26 NOTE — Telephone Encounter (Signed)
Pt scheduled. Confirmed doing ok

## 2020-11-28 ENCOUNTER — Other Ambulatory Visit: Payer: Self-pay | Admitting: Internal Medicine

## 2020-11-28 MED ORDER — METHENAMINE HIPPURATE 1 G PO TABS: 1.0000 g | ORAL_TABLET | Freq: Two times a day (BID) | ORAL | 0 refills | Status: DC

## 2020-11-28 MED ORDER — FLUOXETINE HCL 40 MG PO CAPS: 40.0000 mg | ORAL_CAPSULE | Freq: Every day | ORAL | 0 refills | Status: AC

## 2020-11-28 MED ORDER — MONTELUKAST SODIUM 10 MG PO TABS
10.0000 mg | ORAL_TABLET | Freq: Every day | ORAL | 0 refills | Status: AC
Start: 2020-11-28 — End: ?

## 2020-12-08 ENCOUNTER — Other Ambulatory Visit: Payer: Self-pay

## 2020-12-08 ENCOUNTER — Other Ambulatory Visit: Payer: Medicare Other | Admitting: Primary Care

## 2020-12-08 ENCOUNTER — Other Ambulatory Visit: Payer: Self-pay | Admitting: Internal Medicine

## 2020-12-08 DIAGNOSIS — Z515 Encounter for palliative care: Secondary | ICD-10-CM | POA: Diagnosis not present

## 2020-12-08 DIAGNOSIS — J449 Chronic obstructive pulmonary disease, unspecified: Secondary | ICD-10-CM | POA: Diagnosis not present

## 2020-12-08 DIAGNOSIS — G309 Alzheimer's disease, unspecified: Secondary | ICD-10-CM | POA: Diagnosis not present

## 2020-12-08 DIAGNOSIS — F0280 Dementia in other diseases classified elsewhere without behavioral disturbance: Secondary | ICD-10-CM | POA: Diagnosis not present

## 2020-12-08 DIAGNOSIS — R634 Abnormal weight loss: Secondary | ICD-10-CM | POA: Diagnosis not present

## 2020-12-08 DIAGNOSIS — F0150 Vascular dementia without behavioral disturbance: Secondary | ICD-10-CM

## 2020-12-08 DIAGNOSIS — J4489 Other specified chronic obstructive pulmonary disease: Secondary | ICD-10-CM

## 2020-12-08 NOTE — Progress Notes (Signed)
[facility] Palliative Care Consult Note Telephone: [phone]  Fax: [phone]    Date of encounter: 12/08/20 PATIENT NAME: Diamond Newman [id number]   [phone] (home)  DOB: 29-Apr-1927 MRN: [id number] PRIMARY CARE PROVIDER:    Einar Pheasant, MD,  [address] [place] [address] [phone]  REFERRING PROVIDER:   Einar Pheasant, [address] [place],   [place] [address] [phone]  RESPONSIBLE PARTY:    Contact Information     Name Relation Home Work [place] Daughter [phone]  [phone]   Danyal, [place] [phone]        I met face to face with patient and family in the  home. Palliative Care was asked to follow this patient by consultation request of  Einar Pheasant, MD to address advance care planning and complex medical decision making. This is a follow up visit.                                   ASSESSMENT AND PLAN / RECOMMENDATIONS:   Advance Care Planning/Goals of Care: Goals include to maximize quality of life and symptom management. Our advance care planning conversation included a discussion about:    The value and importance of advance care planning  Experiences with loved ones who have been seriously ill or have died  Exploration of personal, cultural or spiritual beliefs that might influence medical decisions  Exploration of goals of care in the event of a sudden injury or illness  Creation of an  advance directive document  MOST prepared.Uploaded to [facility]. CODE STATUS: dnr Advance care plans. Upon our last visit we had discussed MOST but  POAs were going to discuss. We discussed ongoing care at venues of nursing home or at home.  We discussed the ways to pay for care and the considerations  of home care and skilled nursing care.  I outlined the services of hospice including nursing,  CNA and  psychosocial support through social  work  and chaplaincy , as well as what  eligibility requirements are.  I do feel that the patient is approaching eligibility based on reduced PO intake and function. It would be my recommendation for this supportive care especially if the patient remains in her family home. We planned another follow up within a month to review status and continued goals of care.  Symptom Management/Plan:  I met with patient in her home. She shares her home with her daughter and son who are her caregivers.   Medications were reviewed and we did discuss hospice formulary medications. I will review her current medication per her daughter's request and let her know which ones may not be under formulary  for her review. They will follow up with a post hospital visit with primary provider tomorrow. We did discuss possible medications to deprescribe,  which I have also asked her to discuss with the primary provider. The patient is having a harder time swallowing pills. She is on several dietary supplements  and perhaps those or other medicines  could be reviewed for discontinuing.   They were in the process of increasing Exelon to a therapeutic level. Daughter plans to touch base with neurology earlier than planned to review the efficacy of continuing with that medication.   Mobility. Daughter states this is decreasing. Several months ago she was able to rise and ambulate to  the bathroom. She is now having a lot of trouble bearing weight and they are having difficulty even transferring her from chair to wheelchair. She seems to have lost strength and also ability to follow instruction to turn. She stays in a recliner most days and sleeps in her bed. We discussed hospital bed for improved bed mobility.   Agitation: Has had some occasional  agitation, wanting to go home or other demands. Family employs redirection, however sometimes she becomes overly agitated. I would recommend lorazepam or alprazolam. She is also in the process  of titrating Exelon with neurology.  Follow up Palliative Care Visit: Palliative care will continue to follow for complex medical decision making, advance care planning, and clarification of goals. Return 4-6 weeks or prn.  I spent 60 minutes providing this consultation. More than 50% of the time in this consultation was spent in counseling and care coordination.  PPS: 30%  HOSPICE ELIGIBILITY/DIAGNOSIS: TBD  Chief Complaint: debility  HISTORY OF PRESENT ILLNESS:  Diamond Newman is a 87 y.o. year old female  with Alzheimer's and mixed dementia, lewy body tendencies, COPD, debility, immobility.Has had decreasing po intake and ability to transfer. Family reports increased sleep and increased agitation at times.   History obtained from review of EMR, discussion with primary team, and interview with family, facility staff/caregiver and/or Diamond Newman.  I reviewed available labs, medications, imaging, studies and related documents from the EMR.  Records reviewed and summarized above.   ROS  General: NAD ENMT: endorses mild  dysphagia Cardiovascular: denies chest pain, denies DOE Pulmonary:  endorses cough, denies increased SOB Abdomen: endorses fair appetite, endorses constipation, endorses continence of bowel GU: denies dysuria, endorses continence of urine MSK: endorses weakness,  no falls reported Skin: denies rashes or wounds Neurological: denies pain, denies insomnia Psych: Endorses agitated at times  mood, anxious at times. Heme/lymph/immuno:  endorses scattered bruising, denies  abnormal bleeding  Physical Exam: Current and past weights: recent unavailable, will get from PCP tomorrow. Constitutional: NAD General: frail appearing, thin EYES: anicteric sclera, lids intact, no discharge  ENMT: baseline hearing, oral mucous membranes moist, dentition intact CV: S1S2, RRR, no LE edema Pulmonary: LCTA, no increased work of breathing, no cough, room air Abdomen: intake 25-50%, no  ascites MSK: ++ sarcopenia, moves all extremities, non ambulatory Skin: warm and dry, no rashes or wounds on visible skin Neuro:  ++ generalized weakness,  ++ cognitive impairment Psych: non-anxious affect, A and O x 1 Hem/lymph/immuno: no widespread bruising  Thank you for the opportunity to participate in the care of Diamond Newman.  The palliative care team will continue to follow. Please call our office at [phone] if we can be of additional assistance.   Jason Coop, NP   COVID-19 PATIENT SCREENING TOOL Asked and negative response unless otherwise noted:   Have you had symptoms of covid, tested positive or been in contact with someone with symptoms/positive test in the past 5-10 days?

## 2020-12-09 ENCOUNTER — Encounter: Payer: Self-pay | Admitting: Internal Medicine

## 2020-12-09 ENCOUNTER — Other Ambulatory Visit: Payer: Self-pay

## 2020-12-09 ENCOUNTER — Ambulatory Visit (INDEPENDENT_AMBULATORY_CARE_PROVIDER_SITE_OTHER): Payer: Medicare Other | Admitting: Internal Medicine

## 2020-12-09 VITALS — BP 104/62 | HR 88 | Temp 97.9°F | Resp 16 | Ht 66.0 in | Wt 141.0 lb

## 2020-12-09 DIAGNOSIS — N183 Chronic kidney disease, stage 3 unspecified: Secondary | ICD-10-CM

## 2020-12-09 DIAGNOSIS — R32 Unspecified urinary incontinence: Secondary | ICD-10-CM

## 2020-12-09 DIAGNOSIS — J449 Chronic obstructive pulmonary disease, unspecified: Secondary | ICD-10-CM

## 2020-12-09 DIAGNOSIS — I712 Thoracic aortic aneurysm, without rupture, unspecified: Secondary | ICD-10-CM

## 2020-12-09 DIAGNOSIS — I739 Peripheral vascular disease, unspecified: Secondary | ICD-10-CM | POA: Diagnosis not present

## 2020-12-09 DIAGNOSIS — D649 Anemia, unspecified: Secondary | ICD-10-CM | POA: Diagnosis not present

## 2020-12-09 DIAGNOSIS — R634 Abnormal weight loss: Secondary | ICD-10-CM | POA: Diagnosis not present

## 2020-12-09 DIAGNOSIS — J4489 Other specified chronic obstructive pulmonary disease: Secondary | ICD-10-CM

## 2020-12-09 DIAGNOSIS — Z515 Encounter for palliative care: Secondary | ICD-10-CM

## 2020-12-09 DIAGNOSIS — E1151 Type 2 diabetes mellitus with diabetic peripheral angiopathy without gangrene: Secondary | ICD-10-CM

## 2020-12-09 DIAGNOSIS — K219 Gastro-esophageal reflux disease without esophagitis: Secondary | ICD-10-CM | POA: Diagnosis not present

## 2020-12-09 DIAGNOSIS — N3946 Mixed incontinence: Secondary | ICD-10-CM

## 2020-12-09 DIAGNOSIS — R413 Other amnesia: Secondary | ICD-10-CM | POA: Diagnosis not present

## 2020-12-09 DIAGNOSIS — I10 Essential (primary) hypertension: Secondary | ICD-10-CM

## 2020-12-09 DIAGNOSIS — G309 Alzheimer's disease, unspecified: Secondary | ICD-10-CM

## 2020-12-09 DIAGNOSIS — F0150 Vascular dementia without behavioral disturbance: Secondary | ICD-10-CM

## 2020-12-09 DIAGNOSIS — F0280 Dementia in other diseases classified elsewhere without behavioral disturbance: Secondary | ICD-10-CM

## 2020-12-09 DIAGNOSIS — E7800 Pure hypercholesterolemia, unspecified: Secondary | ICD-10-CM

## 2020-12-09 MED ORDER — FLUTICASONE-SALMETEROL 500-50 MCG/ACT IN AEPB: 1.0000 | INHALATION_SPRAY | Freq: Two times a day (BID) | RESPIRATORY_TRACT | 0 refills | Status: DC

## 2020-12-09 MED ORDER — PANTOPRAZOLE SODIUM 40 MG PO TBEC: 40.0000 mg | DELAYED_RELEASE_TABLET | Freq: Every day | ORAL | 0 refills | Status: AC

## 2020-12-09 MED ORDER — TOLTERODINE TARTRATE 2 MG PO TABS: 2.0000 mg | ORAL_TABLET | Freq: Two times a day (BID) | ORAL | 0 refills | Status: DC

## 2020-12-09 NOTE — Progress Notes (Signed)
[name], female   DOB: 06/12/1926, 87 y.o.   MRN: [id number]   Subjective:    [name], female    DOB: 31-May-1926, 87 y.o.   MRN: [id number]  HPI This visit occurred during the SARS-CoV-2 public health emergency.  Safety protocols were in place, including screening questions prior to the visit, additional usage of staff PPE, and extensive cleaning of exam room while observing appropriate contact time as indicated for disinfecting solutions.   Patient here for ER follow up.  She is accompanied by her daughter.  History obtained from both of them - mostly from her daughter Diamond Newman).  Evaluated 11/18/20 in ER - for dizziness/weakness.  CT head - no acute abnormality.  CT c-spine - degenerative changes without acute injury.  CTA - emphysema and possibel pneumonia or aspiration right lower lobe.  Also noted some dilation of the aorta and aortic atherosclerosis as outlined.  No PE.  Concern over possible UTI.  Given rocephin in ER and discharged on vantin.  Daughter reports she seemed to feel better initially for a few days.  Some days better than others.  She is overall weaker.  More assistance with ADLs.  Not walking and not pushing up (to get out of chair) - as much.  Not eating well.  Has lost weight.  Breathing varies - some days better than others.  Some persistent cough and congestion.  No vomiting.  No abdominal pain reported today.  Bowels are moving.  EKG - ER - Afib.  Discussed today.  Discussed blood thinner, etc.  Rate controlled.    Past Medical History:  Diagnosis Date   Asthma    Chronic bronchitis (HCC)    COPD (chronic obstructive pulmonary disease) ([place])    uses inhalers   Depression    Diabetes mellitus ([place])    GERD (gastroesophageal reflux disease)    hiatal hernia   Gout    Hematuria    followed by urology   History of colon polyps    History of frequent urinary tract infections    Humeral fracture 9/09   comminuted impacted proximal    Hypercholesterolemia    Hypertension    Hypothyroidism    Peripheral vascular disease ([place])    Sleep apnea    does not use cpap as it did not benefit her   Past Surgical History:  Procedure Laterality Date   BLADDER SUSPENSION  12/08/99   BREAST BIOPSY Right 1960's   CARPAL TUNNEL RELEASE Right 03/17/2017   Procedure: CARPAL TUNNEL RELEASE;  Surgeon: Hessie Knows, MD;  Location: [facility] ORS;  Service: Orthopedics;  Laterality: Right;   CHOLECYSTECTOMY  1994   COLONOSCOPY W/ POLYPECTOMY     EYE SURGERY Bilateral    cataract extractions   KNEE SURGERY Right 2013   arthroscopy   ORIF WRIST FRACTURE Right 03/17/2017   Procedure: OPEN REDUCTION INTERNAL FIXATION (ORIF) WRIST FRACTURE;  Surgeon: Hessie Knows, MD;  Location: [facility] ORS;  Service: Orthopedics;  Laterality: Right;   Family History  Problem Relation Age of Onset   Hypertension Father    CVA Father    CAD Father    Hypertension Mother    Colitis Mother    CAD Mother    Breast cancer Neg Hx    Colon cancer Neg Hx    Social History   Socioeconomic History   Marital status: Widowed    Spouse name: Not on file   Number of children: 2   Years  of education: Not on file   Highest education level: Not on file  Occupational History   Not on file  Tobacco Use   Smoking status: Never   Smokeless tobacco: Never  Vaping Use   Vaping Use: Never used  Substance and Sexual Activity   Alcohol use: No    Alcohol/week: 0.0 standard drinks   Drug use: No   Sexual activity: Never  Other Topics Concern   Not on file  Social History Narrative   Not on file   Social Determinants of Health   Financial Resource Strain: Not on file  Food Insecurity: Not on file  Transportation Needs: Not on file  Physical Activity: Not on file  Stress: Not on file  Social Connections: Not on file    Review of Systems  Constitutional:  Positive for appetite change and fatigue.       Decreased appetite.  Weight loss.    HENT:  Negative for  congestion and sinus pressure.   Respiratory:  Positive for cough. Negative for chest tightness.        Chronic sob.  Some days worse than others.   Cardiovascular:  Negative for palpitations and leg swelling.  Gastrointestinal:  Negative for abdominal pain, diarrhea, nausea and vomiting.  Genitourinary:  Negative for difficulty urinating and hematuria.       Wears depends.    Musculoskeletal:  Negative for joint swelling and myalgias.  Skin:  Negative for color change and rash.  Neurological:  Negative for dizziness and headaches.  Psychiatric/Behavioral:  Negative for agitation and dysphoric mood.       Objective:    Physical Exam Vitals reviewed.  Constitutional:      General: She is not in acute distress.    Appearance: Normal appearance.  HENT:     Head: Normocephalic and atraumatic.     Right Ear: External ear normal.     Left Ear: External ear normal.  Eyes:     General: No scleral icterus.       Right eye: No discharge.        Left eye: No discharge.     Conjunctiva/sclera: Conjunctivae normal.  Neck:     Thyroid: No thyromegaly.  Cardiovascular:     Rate and Rhythm: Normal rate.     Comments: Appears to be irregular Pulmonary:     Effort: No respiratory distress.     Breath sounds: Normal breath sounds. No stridor.  Abdominal:     General: Bowel sounds are normal.     Palpations: Abdomen is soft.     Tenderness: There is no abdominal tenderness.  Musculoskeletal:        General: No swelling or tenderness.     Cervical back: Neck supple. No tenderness.  Lymphadenopathy:     Cervical: No cervical adenopathy.  Skin:    Findings: No erythema or rash.  Psychiatric:        Mood and Affect: Mood normal.        Behavior: Behavior normal.    BP 104/62   Pulse 88   Temp 97.9 F (36.6 C)   Resp 16   Ht _0  (1.676 m)   Wt 141 lb (64 kg)   SpO2 98%   BMI 22.76 kg/m  Wt Readings from Last 3 Encounters:  12/09/20 141 lb (64 kg)  11/18/20 144 lb 10 oz (65.6  kg)  10/16/20 148 lb 3.2 oz (67.2 kg)    Outpatient Encounter Medications as of 12/09/2020  Medication  Sig   acetaminophen (TYLENOL) 325 MG tablet Take 650 mg by mouth every 6 (six) hours as needed.   albuterol (VENTOLIN HFA) 108 (90 Base) MCG/ACT inhaler Inhale 2 puffs into the lungs every 6 (six) hours as needed for wheezi ng or shortness of breath.   allopurinol (ZYLOPRIM) 100 MG tablet One tab PO daily   Calcium Carbonate-Vitamin D 600-400 MG-UNIT tablet Take 1 tablet by mouth daily.    cetirizine (ZYRTEC) 10 MG tablet Take 10 mg by mouth daily.   Cholecalciferol (VITAMIN D-3) 1000 UNITS CAPS Take 1 capsule by mouth daily.   Cranberry-Vitamin C-Vitamin E (CRANBERRY PLUS VITAMIN C) 4200-20-3 MG-MG-UNIT CAPS Take 1 capsule by mouth daily.   FLUoxetine (PROZAC) 40 MG capsule Take 1 capsule (40 mg total) by mouth daily.   fluticasone (FLONASE) 50 MCG/ACT nasal spray Place 1 spray into both nostrils daily.   guaiFENesin (MUCINEX) 600 MG 12 hr tablet Take by mouth 2 (two) times daily as needed.   levothyroxine (SYNTHROID) 50 MCG tablet Take 1 tablet (50 mcg total) by mouth daily before breakfast.   methenamine (HIPREX) 1 g tablet Take 1 tablet (1 g total) by mouth 2 (two) times daily with a meal.   montelukast (SINGULAIR) 10 MG tablet Take 1 tablet (10 mg total) by mouth at bedtime.   nystatin (MYCOSTATIN/NYSTOP) powder Apply topically 2 (two) times daily.   nystatin cream (MYCOSTATIN) Apply 1 application topically 2 (two) times daily.   rivastigmine (EXELON) 1.5 MG capsule Take 1.5 mg by mouth 2 (two) times daily.   simvastatin (ZOCOR) 20 MG tablet Take 1 tablet (20 mg total) by mouth daily.   [DISCONTINUED] Cholecalciferol 25 MCG (1000 UT) tablet Take 1,000 Units by mouth daily.   [DISCONTINUED] fluticasone-salmeterol (ADVAIR DISKUS) 500-50 MCG/ACT AEPB Inhale 1 puff into the lungs 2 (two) times daily.   [DISCONTINUED] pantoprazole (PROTONIX) 40 MG tablet Take 1 tablet (40 mg total) by mouth  daily.   [DISCONTINUED] tolterodine (DETROL) 2 MG tablet Take 1 tablet (2 mg total) by mouth 2 (two) times daily.   No facility-administered encounter medications on file as of 12/09/2020.     Lab Results  Component Value Date   WBC 9.0 11/18/2020   HGB 10.9 (L) 11/18/2020   HCT 33.6 (L) 11/18/2020   PLT 244 11/18/2020   GLUCOSE 170 (H) 11/18/2020   CHOL 124 10/16/2020   TRIG 103.0 10/16/2020   HDL 44.20 10/16/2020   LDLCALC 59 10/16/2020   ALT 14 11/18/2020   AST 28 11/18/2020   NA 139 11/18/2020   K 3.9 11/18/2020   CL 108 11/18/2020   CREATININE 1.18 (H) 11/18/2020   BUN 25 (H) 11/18/2020   CO2 24 11/18/2020   TSH 1.11 10/16/2020   HGBA1C 6.4 10/16/2020   MICROALBUR 8.8 (H) 07/10/2019    DG Chest 2 View  Result Date: 11/18/2020 CLINICAL DATA:  Recent syncopal episode EXAM: CHEST - 2 VIEW COMPARISON:  03/14/2018 FINDINGS: Cardiac shadow is stable. Aortic calcifications are again seen. The lungs are clear. Chronic interstitial changes are noted in the bases posteriorly stable from the prior exam. No acute bony abnormality is seen. IMPRESSION: Chronic fibrotic and interstitial changes in the bases posteriorly. No acute abnormality noted. Electronically Signed   By: Inez Catalina M.D.   On: 11/18/2020 15:30   CT Head Wo Contrast  Result Date: 11/18/2020 CLINICAL DATA:  Syncopal episode and subsequent fall. EXAM: CT HEAD WITHOUT CONTRAST TECHNIQUE: Contiguous axial images were obtained from the base of  the skull through the vertex without intravenous contrast. COMPARISON:  November 08, 2013 FINDINGS: Brain: There is mild cerebral atrophy with widening of the extra-axial spaces and ventricular dilatation. There are areas of decreased attenuation within the white matter tracts of the supratentorial brain, consistent with microvascular disease changes. Vascular: No hyperdense vessel or unexpected calcification. Skull: Normal. Negative for fracture or focal lesion. Sinuses/Orbits: No acute  finding. Other: None. IMPRESSION: 1. Generalized cerebral atrophy. 2. No acute intracranial abnormality. Electronically Signed   By: Virgina Norfolk M.D.   On: 11/18/2020 18:07   CT Angio Chest PE W and/or Wo Contrast  Result Date: 11/18/2020 CLINICAL DATA:  87 year old female with concern for pulmonary embolism. EXAM: CT ANGIOGRAPHY CHEST WITH CONTRAST TECHNIQUE: Multidetector CT imaging of the chest was performed using the standard protocol during bolus administration of intravenous contrast. Multiplanar CT image reconstructions and MIPs were obtained to evaluate the vascular anatomy. CONTRAST:  35m OMNIPAQUE IOHEXOL 350 MG/ML SOLN COMPARISON:  Chest radiograph dated 11/18/2020. FINDINGS: Cardiovascular: Borderline cardiomegaly. No pericardial effusion. Three-vessel coronary vascular calcification. There is advanced calcified and noncalcified plaque of the thoracic aorta. There is borderline dilatation of the ascending aorta measuring 4 cm in greatest axial diameter. The descending thoracic aorta is dilated measuring up to 4.9 cm. There is heterogeneous appearance of the contrast within the descending thoracic aorta, likely secondary to mixing artifact. No periaortic fluid collection. Evaluation of the pulmonary arteries is limited due to respiratory motion artifact and suboptimal opacification and visualization of the peripheral branches. No pulmonary artery embolus identified. Mediastinum/Nodes: Borderline enlarged right hilar lymph node measures 11 mm in short axis. Subcarinal lymph node measures 13 mm. The esophagus is grossly unremarkable. No mediastinal fluid collection. Lungs/Pleura: Background of emphysema. Patchy area of airspace opacity involving the right lower lobe may represent pneumonia or aspiration. No pleural effusion or pneumothorax. There is mucous content within the right lower lobe bronchus. The central airways are otherwise patent. Upper Abdomen: Cholecystectomy. Musculoskeletal:  Osteopenia with degenerative changes of the spine. No acute osseous pathology. Review of the MIP images confirms the above findings. IMPRESSION: 1. No CT evidence of pulmonary embolism. 2. Emphysema with patchy area of airspace opacity involving the right lower lobe may represent pneumonia or aspiration. Clinical correlation and follow-up to resolution recommended. 3. Dilated thoracic aorta measuring up to 4 cm in the ascending aorta and 4.9 cm in descending thoracic aorta. Recommend annual imaging followup by CTA or MRA. This recommendation follows 2010 ACCF/AHA/AATS/ACR/ASA/SCA/SCAI/SIR/STS/SVM Guidelines for the Diagnosis and Management of Patients with Thoracic Aortic Disease. Circulation. 2010; 121:: R443-X540 Aortic aneurysm NOS (ICD10-I71.9) 4. Aortic Atherosclerosis (ICD10-I70.0) and Emphysema (ICD10-J43.9). Electronically Signed   By: AAnner CreteM.D.   On: 11/18/2020 18:00   CT Cervical Spine Wo Contrast  Result Date: 11/18/2020 CLINICAL DATA:  Syncopal episode. EXAM: CT CERVICAL SPINE WITHOUT CONTRAST TECHNIQUE: Multidetector CT imaging of the cervical spine was performed without intravenous contrast. Multiplanar CT image reconstructions were also generated. COMPARISON:  None. FINDINGS: Alignment: Approximately 1 mm anterolisthesis of C7 is noted on T1. Skull base and vertebrae: No acute fracture. No primary bone lesion or focal pathologic process. Soft tissues and spinal canal: No prevertebral fluid or swelling. No visible canal hematoma. Disc levels: There is mild anterior endplate sclerosis at the level of C3-C4. Marked severity endplate sclerosis is seen at the levels of C4-C5, C5-C6 and C6-C7. Marked severity intervertebral disc space narrowing is seen at the levels of C4-C5, C5-C6 and C6-C7. Bilateral marked severity multilevel facet  joint hypertrophy is noted. Upper chest: Negative. Other: None. IMPRESSION: 1. No acute cervical spine fracture. 2. Marked severity multilevel degenerative  changes. Electronically Signed   By: Virgina Norfolk M.D.   On: 11/18/2020 18:03       Assessment & Plan:   Problem List Items Addressed This Visit     Anemia    Check cbc with iron studies.        Relevant Orders   CBC with Differential/Platelet   IBC + Ferritin   Aneurysm of thoracic aorta ([place])    CT as outlined. Dilation of aorta as outlined.  Discussed today.  Discussed f/u (CTA/MRA).  Wants to hold on any further testing or evaluation.         Chronic obstructive airway disease with asthma (HCC)    Breathing varies.  Continue advair.  Overall stable currently.  Follow.        CKD (chronic kidney disease) stage 3, GFR 30-59 ml/min (HCC)    Avoid antiinflammatories.  Stay hydrated.  Follow metabolic panel.        Relevant Orders   Basic metabolic panel   Hepatic function panel   Diabetes mellitus with peripheral vascular disease ([place])    On no medication.  Follow met b and a1c.         Relevant Orders   Hepatic function panel   GERD (gastroesophageal reflux disease)    No upper symptoms.  Controlled on protonix.        Hypercholesterolemia    Continue simvastatin. Follow lipid panel and liver function tests.         Hypertension - Primary    Blood pressure doing well on no medication.  Follow pressures.  Follow metabolic panel.        Mixed Alzheimer's and vascular dementia ([place])    Evaluated by neurology.  Off aricept. Intolerant - vivid dreams.  Follow.        Mixed urge and stress incontinence    Wears depends.  Daughter request stopping detrol.         Palliative care patient    Currently being followed by palliative care.  Discussed with [facility] today.  Given continued decline, decreased po intake,weight loss and decreased albumin, underlying breathing issues, etc, I do feel she meets criteria for hospice care. Daughter in agreement to proceed with discussion for possible transition to hospice care.           Peripheral vascular disease  ([place])    Has known varying pressures in each arm.  Continue simvastatin.  No pain.  Follow.        Weight loss    Has had significant amount of weight loss.  Weight 06/2020 154 and today 141 pounds.  Encourage increased po intake.  Discussed given weight decrease and decline in health - hospice.   Currently being followed by palliative care.  Daughter in agreement.  Will discuss with palliative care regarding transition.        Other Visit Diagnoses     Urinary incontinence, unspecified type       Relevant Orders   Urinalysis, Routine w reflex microscopic   Urine Culture   Memory change       Relevant Orders   Vitamin B12        Einar Pheasant, MD

## 2020-12-10 ENCOUNTER — Telehealth: Payer: Self-pay | Admitting: Primary Care

## 2020-12-10 NOTE — Telephone Encounter (Signed)
Call from Dr. Dale [place] re patient's decline and readiness for hospice. She has lost almost 10% weight since Feb 22 and in June albumin was 2. 4. She is now at at PPS of weak 30%. Family would like hospice services. She will sign all paperwork, and referral made to [facility].

## 2020-12-11 ENCOUNTER — Encounter: Payer: Self-pay | Admitting: Internal Medicine

## 2020-12-11 DIAGNOSIS — I712 Thoracic aortic aneurysm, without rupture, unspecified: Secondary | ICD-10-CM | POA: Insufficient documentation

## 2020-12-11 DIAGNOSIS — Z515 Encounter for palliative care: Secondary | ICD-10-CM | POA: Insufficient documentation

## 2020-12-11 NOTE — Assessment & Plan Note (Signed)
Continue simvastatin.  Follow lipid panel and liver function tests.   

## 2020-12-11 NOTE — Assessment & Plan Note (Signed)
Evaluated by neurology.  Off aricept. Intolerant - vivid dreams.  Follow.

## 2020-12-11 NOTE — Assessment & Plan Note (Signed)
On no medication.  Follow met b and a1c.  

## 2020-12-11 NOTE — Assessment & Plan Note (Signed)
Currently being followed by palliative care.  Discussed with [facility] today.  Given continued decline, decreased po intake,weight loss and decreased albumin, underlying breathing issues, etc, I do feel she meets criteria for hospice care. Daughter in agreement to proceed with discussion for possible transition to hospice care.

## 2020-12-11 NOTE — Assessment & Plan Note (Signed)
CT as outlined. Dilation of aorta as outlined.  Discussed today.  Discussed f/u (CTA/MRA).  Wants to hold on any further testing or evaluation.

## 2020-12-11 NOTE — Assessment & Plan Note (Signed)
Has known varying pressures in each arm.  Continue simvastatin.  No pain.  Follow.

## 2020-12-11 NOTE — Assessment & Plan Note (Signed)
Has had significant amount of weight loss.  Weight 06/2020 154 and today 141 pounds.  Encourage increased po intake.  Discussed given weight decrease and decline in health - hospice.   Currently being followed by palliative care.  Daughter in agreement.  Will discuss with palliative care regarding transition.

## 2020-12-11 NOTE — Assessment & Plan Note (Signed)
Avoid antiinflammatories.  Stay hydrated.  Follow metabolic panel.   

## 2020-12-11 NOTE — Assessment & Plan Note (Signed)
Blood pressure doing well on no medication.  Follow pressures.  Follow metabolic panel.   

## 2020-12-11 NOTE — Assessment & Plan Note (Signed)
Check cbc with iron studies.

## 2020-12-11 NOTE — Assessment & Plan Note (Signed)
No upper symptoms.  Controlled on protonix.  

## 2020-12-11 NOTE — Assessment & Plan Note (Signed)
Wears depends.  Daughter request stopping detrol.

## 2020-12-11 NOTE — Assessment & Plan Note (Signed)
Breathing varies.  Continue advair.  Overall stable currently.  Follow.

## 2020-12-15 ENCOUNTER — Other Ambulatory Visit: Payer: Medicare Other

## 2020-12-15 DIAGNOSIS — F419 Anxiety disorder, unspecified: Secondary | ICD-10-CM | POA: Diagnosis not present

## 2020-12-15 DIAGNOSIS — N1832 Chronic kidney disease, stage 3b: Secondary | ICD-10-CM | POA: Diagnosis not present

## 2020-12-15 DIAGNOSIS — E039 Hypothyroidism, unspecified: Secondary | ICD-10-CM | POA: Diagnosis not present

## 2020-12-15 DIAGNOSIS — G309 Alzheimer's disease, unspecified: Secondary | ICD-10-CM | POA: Diagnosis not present

## 2020-12-15 DIAGNOSIS — L89892 Pressure ulcer of other site, stage 2: Secondary | ICD-10-CM | POA: Diagnosis not present

## 2020-12-15 DIAGNOSIS — E1151 Type 2 diabetes mellitus with diabetic peripheral angiopathy without gangrene: Secondary | ICD-10-CM | POA: Diagnosis not present

## 2020-12-15 DIAGNOSIS — J449 Chronic obstructive pulmonary disease, unspecified: Secondary | ICD-10-CM | POA: Diagnosis not present

## 2020-12-15 DIAGNOSIS — M109 Gout, unspecified: Secondary | ICD-10-CM | POA: Diagnosis not present

## 2020-12-15 DIAGNOSIS — F0150 Vascular dementia without behavioral disturbance: Secondary | ICD-10-CM | POA: Diagnosis not present

## 2020-12-15 DIAGNOSIS — F32A Depression, unspecified: Secondary | ICD-10-CM | POA: Diagnosis not present

## 2020-12-15 DIAGNOSIS — K219 Gastro-esophageal reflux disease without esophagitis: Secondary | ICD-10-CM | POA: Diagnosis not present

## 2020-12-15 DIAGNOSIS — F0280 Dementia in other diseases classified elsewhere without behavioral disturbance: Secondary | ICD-10-CM | POA: Diagnosis not present

## 2020-12-15 DIAGNOSIS — E1122 Type 2 diabetes mellitus with diabetic chronic kidney disease: Secondary | ICD-10-CM | POA: Diagnosis not present

## 2020-12-15 DIAGNOSIS — Z6822 Body mass index (BMI) 22.0-22.9, adult: Secondary | ICD-10-CM | POA: Diagnosis not present

## 2020-12-15 DIAGNOSIS — D631 Anemia in chronic kidney disease: Secondary | ICD-10-CM | POA: Diagnosis not present

## 2020-12-15 DIAGNOSIS — I712 Thoracic aortic aneurysm, without rupture: Secondary | ICD-10-CM | POA: Diagnosis not present

## 2020-12-15 DIAGNOSIS — J302 Other seasonal allergic rhinitis: Secondary | ICD-10-CM | POA: Diagnosis not present

## 2020-12-15 DIAGNOSIS — R634 Abnormal weight loss: Secondary | ICD-10-CM | POA: Diagnosis not present

## 2020-12-15 DIAGNOSIS — I129 Hypertensive chronic kidney disease with stage 1 through stage 4 chronic kidney disease, or unspecified chronic kidney disease: Secondary | ICD-10-CM | POA: Diagnosis not present

## 2020-12-15 DIAGNOSIS — L89151 Pressure ulcer of sacral region, stage 1: Secondary | ICD-10-CM | POA: Diagnosis not present

## 2020-12-15 DIAGNOSIS — E8809 Other disorders of plasma-protein metabolism, not elsewhere classified: Secondary | ICD-10-CM | POA: Diagnosis not present

## 2020-12-15 DIAGNOSIS — Z87440 Personal history of urinary (tract) infections: Secondary | ICD-10-CM | POA: Diagnosis not present

## 2020-12-15 DIAGNOSIS — G4733 Obstructive sleep apnea (adult) (pediatric): Secondary | ICD-10-CM | POA: Diagnosis not present

## 2020-12-15 DIAGNOSIS — L308 Other specified dermatitis: Secondary | ICD-10-CM | POA: Diagnosis not present

## 2020-12-15 DIAGNOSIS — E43 Unspecified severe protein-calorie malnutrition: Secondary | ICD-10-CM | POA: Diagnosis not present

## 2020-12-16 DIAGNOSIS — F0150 Vascular dementia without behavioral disturbance: Secondary | ICD-10-CM | POA: Diagnosis not present

## 2020-12-16 DIAGNOSIS — G309 Alzheimer's disease, unspecified: Secondary | ICD-10-CM | POA: Diagnosis not present

## 2020-12-16 DIAGNOSIS — E43 Unspecified severe protein-calorie malnutrition: Secondary | ICD-10-CM | POA: Diagnosis not present

## 2020-12-16 DIAGNOSIS — E1151 Type 2 diabetes mellitus with diabetic peripheral angiopathy without gangrene: Secondary | ICD-10-CM | POA: Diagnosis not present

## 2020-12-16 DIAGNOSIS — F0280 Dementia in other diseases classified elsewhere without behavioral disturbance: Secondary | ICD-10-CM | POA: Diagnosis not present

## 2020-12-16 DIAGNOSIS — J449 Chronic obstructive pulmonary disease, unspecified: Secondary | ICD-10-CM | POA: Diagnosis not present

## 2020-12-18 DIAGNOSIS — F0150 Vascular dementia without behavioral disturbance: Secondary | ICD-10-CM | POA: Diagnosis not present

## 2020-12-18 DIAGNOSIS — J449 Chronic obstructive pulmonary disease, unspecified: Secondary | ICD-10-CM | POA: Diagnosis not present

## 2020-12-18 DIAGNOSIS — F0280 Dementia in other diseases classified elsewhere without behavioral disturbance: Secondary | ICD-10-CM | POA: Diagnosis not present

## 2020-12-18 DIAGNOSIS — G309 Alzheimer's disease, unspecified: Secondary | ICD-10-CM | POA: Diagnosis not present

## 2020-12-18 DIAGNOSIS — E43 Unspecified severe protein-calorie malnutrition: Secondary | ICD-10-CM | POA: Diagnosis not present

## 2020-12-18 DIAGNOSIS — E1151 Type 2 diabetes mellitus with diabetic peripheral angiopathy without gangrene: Secondary | ICD-10-CM | POA: Diagnosis not present

## 2020-12-22 DIAGNOSIS — Z6822 Body mass index (BMI) 22.0-22.9, adult: Secondary | ICD-10-CM | POA: Diagnosis not present

## 2020-12-22 DIAGNOSIS — L308 Other specified dermatitis: Secondary | ICD-10-CM | POA: Diagnosis not present

## 2020-12-22 DIAGNOSIS — F0150 Vascular dementia without behavioral disturbance: Secondary | ICD-10-CM | POA: Diagnosis not present

## 2020-12-22 DIAGNOSIS — G309 Alzheimer's disease, unspecified: Secondary | ICD-10-CM | POA: Diagnosis not present

## 2020-12-22 DIAGNOSIS — N1832 Chronic kidney disease, stage 3b: Secondary | ICD-10-CM | POA: Diagnosis not present

## 2020-12-22 DIAGNOSIS — J449 Chronic obstructive pulmonary disease, unspecified: Secondary | ICD-10-CM | POA: Diagnosis not present

## 2020-12-22 DIAGNOSIS — L89151 Pressure ulcer of sacral region, stage 1: Secondary | ICD-10-CM | POA: Diagnosis not present

## 2020-12-22 DIAGNOSIS — Z87440 Personal history of urinary (tract) infections: Secondary | ICD-10-CM | POA: Diagnosis not present

## 2020-12-22 DIAGNOSIS — L89892 Pressure ulcer of other site, stage 2: Secondary | ICD-10-CM | POA: Diagnosis not present

## 2020-12-22 DIAGNOSIS — E1122 Type 2 diabetes mellitus with diabetic chronic kidney disease: Secondary | ICD-10-CM | POA: Diagnosis not present

## 2020-12-22 DIAGNOSIS — E8809 Other disorders of plasma-protein metabolism, not elsewhere classified: Secondary | ICD-10-CM | POA: Diagnosis not present

## 2020-12-22 DIAGNOSIS — M109 Gout, unspecified: Secondary | ICD-10-CM | POA: Diagnosis not present

## 2020-12-22 DIAGNOSIS — D631 Anemia in chronic kidney disease: Secondary | ICD-10-CM | POA: Diagnosis not present

## 2020-12-22 DIAGNOSIS — F0280 Dementia in other diseases classified elsewhere without behavioral disturbance: Secondary | ICD-10-CM | POA: Diagnosis not present

## 2020-12-22 DIAGNOSIS — R634 Abnormal weight loss: Secondary | ICD-10-CM | POA: Diagnosis not present

## 2020-12-22 DIAGNOSIS — E1151 Type 2 diabetes mellitus with diabetic peripheral angiopathy without gangrene: Secondary | ICD-10-CM | POA: Diagnosis not present

## 2020-12-22 DIAGNOSIS — I129 Hypertensive chronic kidney disease with stage 1 through stage 4 chronic kidney disease, or unspecified chronic kidney disease: Secondary | ICD-10-CM | POA: Diagnosis not present

## 2020-12-22 DIAGNOSIS — E43 Unspecified severe protein-calorie malnutrition: Secondary | ICD-10-CM | POA: Diagnosis not present

## 2020-12-22 DIAGNOSIS — E039 Hypothyroidism, unspecified: Secondary | ICD-10-CM | POA: Diagnosis not present

## 2020-12-22 DIAGNOSIS — F32A Depression, unspecified: Secondary | ICD-10-CM | POA: Diagnosis not present

## 2020-12-22 DIAGNOSIS — I712 Thoracic aortic aneurysm, without rupture: Secondary | ICD-10-CM | POA: Diagnosis not present

## 2020-12-22 DIAGNOSIS — F419 Anxiety disorder, unspecified: Secondary | ICD-10-CM | POA: Diagnosis not present

## 2020-12-22 DIAGNOSIS — J302 Other seasonal allergic rhinitis: Secondary | ICD-10-CM | POA: Diagnosis not present

## 2020-12-22 DIAGNOSIS — G4733 Obstructive sleep apnea (adult) (pediatric): Secondary | ICD-10-CM | POA: Diagnosis not present

## 2020-12-22 DIAGNOSIS — K219 Gastro-esophageal reflux disease without esophagitis: Secondary | ICD-10-CM | POA: Diagnosis not present

## 2020-12-23 ENCOUNTER — Other Ambulatory Visit: Payer: Self-pay | Admitting: Internal Medicine

## 2020-12-24 DIAGNOSIS — F0150 Vascular dementia without behavioral disturbance: Secondary | ICD-10-CM | POA: Diagnosis not present

## 2020-12-24 DIAGNOSIS — G309 Alzheimer's disease, unspecified: Secondary | ICD-10-CM | POA: Diagnosis not present

## 2020-12-24 DIAGNOSIS — J449 Chronic obstructive pulmonary disease, unspecified: Secondary | ICD-10-CM | POA: Diagnosis not present

## 2020-12-24 DIAGNOSIS — E1151 Type 2 diabetes mellitus with diabetic peripheral angiopathy without gangrene: Secondary | ICD-10-CM | POA: Diagnosis not present

## 2020-12-24 DIAGNOSIS — E43 Unspecified severe protein-calorie malnutrition: Secondary | ICD-10-CM | POA: Diagnosis not present

## 2020-12-24 DIAGNOSIS — F0280 Dementia in other diseases classified elsewhere without behavioral disturbance: Secondary | ICD-10-CM | POA: Diagnosis not present

## 2020-12-31 ENCOUNTER — Other Ambulatory Visit: Payer: Medicare Other | Admitting: Primary Care

## 2020-12-31 DIAGNOSIS — J449 Chronic obstructive pulmonary disease, unspecified: Secondary | ICD-10-CM | POA: Diagnosis not present

## 2020-12-31 DIAGNOSIS — F0280 Dementia in other diseases classified elsewhere without behavioral disturbance: Secondary | ICD-10-CM | POA: Diagnosis not present

## 2020-12-31 DIAGNOSIS — E43 Unspecified severe protein-calorie malnutrition: Secondary | ICD-10-CM | POA: Diagnosis not present

## 2020-12-31 DIAGNOSIS — E1151 Type 2 diabetes mellitus with diabetic peripheral angiopathy without gangrene: Secondary | ICD-10-CM | POA: Diagnosis not present

## 2020-12-31 DIAGNOSIS — G309 Alzheimer's disease, unspecified: Secondary | ICD-10-CM | POA: Diagnosis not present

## 2020-12-31 DIAGNOSIS — F0150 Vascular dementia without behavioral disturbance: Secondary | ICD-10-CM | POA: Diagnosis not present

## 2021-01-06 DIAGNOSIS — F0280 Dementia in other diseases classified elsewhere without behavioral disturbance: Secondary | ICD-10-CM | POA: Diagnosis not present

## 2021-01-06 DIAGNOSIS — G309 Alzheimer's disease, unspecified: Secondary | ICD-10-CM | POA: Diagnosis not present

## 2021-01-06 DIAGNOSIS — F0150 Vascular dementia without behavioral disturbance: Secondary | ICD-10-CM | POA: Diagnosis not present

## 2021-01-06 DIAGNOSIS — E43 Unspecified severe protein-calorie malnutrition: Secondary | ICD-10-CM | POA: Diagnosis not present

## 2021-01-06 DIAGNOSIS — E1151 Type 2 diabetes mellitus with diabetic peripheral angiopathy without gangrene: Secondary | ICD-10-CM | POA: Diagnosis not present

## 2021-01-06 DIAGNOSIS — J449 Chronic obstructive pulmonary disease, unspecified: Secondary | ICD-10-CM | POA: Diagnosis not present

## 2021-01-07 DIAGNOSIS — G309 Alzheimer's disease, unspecified: Secondary | ICD-10-CM | POA: Diagnosis not present

## 2021-01-07 DIAGNOSIS — F0150 Vascular dementia without behavioral disturbance: Secondary | ICD-10-CM | POA: Diagnosis not present

## 2021-01-07 DIAGNOSIS — F0280 Dementia in other diseases classified elsewhere without behavioral disturbance: Secondary | ICD-10-CM | POA: Diagnosis not present

## 2021-01-07 DIAGNOSIS — J449 Chronic obstructive pulmonary disease, unspecified: Secondary | ICD-10-CM | POA: Diagnosis not present

## 2021-01-07 DIAGNOSIS — E1151 Type 2 diabetes mellitus with diabetic peripheral angiopathy without gangrene: Secondary | ICD-10-CM | POA: Diagnosis not present

## 2021-01-07 DIAGNOSIS — E43 Unspecified severe protein-calorie malnutrition: Secondary | ICD-10-CM | POA: Diagnosis not present

## 2021-01-16 DIAGNOSIS — F0280 Dementia in other diseases classified elsewhere without behavioral disturbance: Secondary | ICD-10-CM | POA: Diagnosis not present

## 2021-01-16 DIAGNOSIS — E43 Unspecified severe protein-calorie malnutrition: Secondary | ICD-10-CM | POA: Diagnosis not present

## 2021-01-16 DIAGNOSIS — G309 Alzheimer's disease, unspecified: Secondary | ICD-10-CM | POA: Diagnosis not present

## 2021-01-16 DIAGNOSIS — J449 Chronic obstructive pulmonary disease, unspecified: Secondary | ICD-10-CM | POA: Diagnosis not present

## 2021-01-16 DIAGNOSIS — E1151 Type 2 diabetes mellitus with diabetic peripheral angiopathy without gangrene: Secondary | ICD-10-CM | POA: Diagnosis not present

## 2021-01-16 DIAGNOSIS — F0150 Vascular dementia without behavioral disturbance: Secondary | ICD-10-CM | POA: Diagnosis not present

## 2021-01-19 ENCOUNTER — Ambulatory Visit: Payer: Medicare Other | Admitting: Internal Medicine

## 2021-01-19 ENCOUNTER — Telehealth: Payer: Self-pay

## 2021-01-19 NOTE — Telephone Encounter (Signed)
Pt's daughter Raynelle Fanning called and states that she had to cancel mother's appt for today because she is in hospice care and thought she did not need follow up appt. However; Raynelle Fanning just spoke to the hospice nurse and she advised that pt keep appt with Dr Lorin Picket due to ongoing cough and congestion for over two weeks.Appt is already taken. They want to know what Dr Lorin Picket advises to do.

## 2021-01-20 DIAGNOSIS — E43 Unspecified severe protein-calorie malnutrition: Secondary | ICD-10-CM | POA: Diagnosis not present

## 2021-01-20 DIAGNOSIS — F0150 Vascular dementia without behavioral disturbance: Secondary | ICD-10-CM | POA: Diagnosis not present

## 2021-01-20 DIAGNOSIS — J449 Chronic obstructive pulmonary disease, unspecified: Secondary | ICD-10-CM | POA: Diagnosis not present

## 2021-01-20 DIAGNOSIS — G309 Alzheimer's disease, unspecified: Secondary | ICD-10-CM | POA: Diagnosis not present

## 2021-01-20 DIAGNOSIS — F0280 Dementia in other diseases classified elsewhere without behavioral disturbance: Secondary | ICD-10-CM | POA: Diagnosis not present

## 2021-01-20 DIAGNOSIS — E1151 Type 2 diabetes mellitus with diabetic peripheral angiopathy without gangrene: Secondary | ICD-10-CM | POA: Diagnosis not present

## 2021-01-21 NOTE — Telephone Encounter (Signed)
Hospice nurse came out to evaluate patient on Friday and patient is now on oxygen due to her O2 dropping. She is coughing and has been for about 3 weeks. O2 levels are better now that she is on the oxygen. No increased sob, chest pain, fever, etc. Hospice nurse reassessed this week and stated that her lungs sound more clear than they did last week but a follow up with pcp in the near future would not be a bad idea. Is there somewhere we can work her in next week? Virtual at 9/8 at 4 if Raynelle Fanning is agreeable?

## 2021-01-21 NOTE — Telephone Encounter (Signed)
LMTCB

## 2021-01-21 NOTE — Telephone Encounter (Signed)
Ok to schedule appt.  If needs something before, let us know.

## 2021-01-22 DIAGNOSIS — J302 Other seasonal allergic rhinitis: Secondary | ICD-10-CM | POA: Diagnosis not present

## 2021-01-22 DIAGNOSIS — G309 Alzheimer's disease, unspecified: Secondary | ICD-10-CM | POA: Diagnosis not present

## 2021-01-22 DIAGNOSIS — I712 Thoracic aortic aneurysm, without rupture: Secondary | ICD-10-CM | POA: Diagnosis not present

## 2021-01-22 DIAGNOSIS — L308 Other specified dermatitis: Secondary | ICD-10-CM | POA: Diagnosis not present

## 2021-01-22 DIAGNOSIS — E1122 Type 2 diabetes mellitus with diabetic chronic kidney disease: Secondary | ICD-10-CM | POA: Diagnosis not present

## 2021-01-22 DIAGNOSIS — R634 Abnormal weight loss: Secondary | ICD-10-CM | POA: Diagnosis not present

## 2021-01-22 DIAGNOSIS — E8809 Other disorders of plasma-protein metabolism, not elsewhere classified: Secondary | ICD-10-CM | POA: Diagnosis not present

## 2021-01-22 DIAGNOSIS — F0280 Dementia in other diseases classified elsewhere without behavioral disturbance: Secondary | ICD-10-CM | POA: Diagnosis not present

## 2021-01-22 DIAGNOSIS — N1832 Chronic kidney disease, stage 3b: Secondary | ICD-10-CM | POA: Diagnosis not present

## 2021-01-22 DIAGNOSIS — F0150 Vascular dementia without behavioral disturbance: Secondary | ICD-10-CM | POA: Diagnosis not present

## 2021-01-22 DIAGNOSIS — J449 Chronic obstructive pulmonary disease, unspecified: Secondary | ICD-10-CM | POA: Diagnosis not present

## 2021-01-22 DIAGNOSIS — E039 Hypothyroidism, unspecified: Secondary | ICD-10-CM | POA: Diagnosis not present

## 2021-01-22 DIAGNOSIS — F419 Anxiety disorder, unspecified: Secondary | ICD-10-CM | POA: Diagnosis not present

## 2021-01-22 DIAGNOSIS — L89151 Pressure ulcer of sacral region, stage 1: Secondary | ICD-10-CM | POA: Diagnosis not present

## 2021-01-22 DIAGNOSIS — G4733 Obstructive sleep apnea (adult) (pediatric): Secondary | ICD-10-CM | POA: Diagnosis not present

## 2021-01-22 DIAGNOSIS — D631 Anemia in chronic kidney disease: Secondary | ICD-10-CM | POA: Diagnosis not present

## 2021-01-22 DIAGNOSIS — K219 Gastro-esophageal reflux disease without esophagitis: Secondary | ICD-10-CM | POA: Diagnosis not present

## 2021-01-22 DIAGNOSIS — I129 Hypertensive chronic kidney disease with stage 1 through stage 4 chronic kidney disease, or unspecified chronic kidney disease: Secondary | ICD-10-CM | POA: Diagnosis not present

## 2021-01-22 DIAGNOSIS — Z87440 Personal history of urinary (tract) infections: Secondary | ICD-10-CM | POA: Diagnosis not present

## 2021-01-22 DIAGNOSIS — L89892 Pressure ulcer of other site, stage 2: Secondary | ICD-10-CM | POA: Diagnosis not present

## 2021-01-22 DIAGNOSIS — Z6822 Body mass index (BMI) 22.0-22.9, adult: Secondary | ICD-10-CM | POA: Diagnosis not present

## 2021-01-22 DIAGNOSIS — M109 Gout, unspecified: Secondary | ICD-10-CM | POA: Diagnosis not present

## 2021-01-22 DIAGNOSIS — F32A Depression, unspecified: Secondary | ICD-10-CM | POA: Diagnosis not present

## 2021-01-22 DIAGNOSIS — E43 Unspecified severe protein-calorie malnutrition: Secondary | ICD-10-CM | POA: Diagnosis not present

## 2021-01-22 DIAGNOSIS — E1151 Type 2 diabetes mellitus with diabetic peripheral angiopathy without gangrene: Secondary | ICD-10-CM | POA: Diagnosis not present

## 2021-01-22 NOTE — Telephone Encounter (Signed)
Patient has been scheduled to come in office on 9/8 because daughter says that she has a hard time understanding what is being discussed when doing virtual. Ok per Dr Lorin Picket.

## 2021-01-29 ENCOUNTER — Other Ambulatory Visit: Payer: Self-pay

## 2021-01-29 ENCOUNTER — Ambulatory Visit (INDEPENDENT_AMBULATORY_CARE_PROVIDER_SITE_OTHER): Payer: Medicare Other | Admitting: Internal Medicine

## 2021-01-29 DIAGNOSIS — E1151 Type 2 diabetes mellitus with diabetic peripheral angiopathy without gangrene: Secondary | ICD-10-CM | POA: Diagnosis not present

## 2021-01-29 DIAGNOSIS — I739 Peripheral vascular disease, unspecified: Secondary | ICD-10-CM

## 2021-01-29 DIAGNOSIS — I712 Thoracic aortic aneurysm, without rupture, unspecified: Secondary | ICD-10-CM

## 2021-01-29 DIAGNOSIS — J449 Chronic obstructive pulmonary disease, unspecified: Secondary | ICD-10-CM

## 2021-01-29 DIAGNOSIS — F0280 Dementia in other diseases classified elsewhere without behavioral disturbance: Secondary | ICD-10-CM | POA: Diagnosis not present

## 2021-01-29 DIAGNOSIS — G309 Alzheimer's disease, unspecified: Secondary | ICD-10-CM

## 2021-01-29 DIAGNOSIS — R634 Abnormal weight loss: Secondary | ICD-10-CM

## 2021-01-29 DIAGNOSIS — N183 Chronic kidney disease, stage 3 unspecified: Secondary | ICD-10-CM | POA: Diagnosis not present

## 2021-01-29 DIAGNOSIS — K219 Gastro-esophageal reflux disease without esophagitis: Secondary | ICD-10-CM | POA: Diagnosis not present

## 2021-01-29 DIAGNOSIS — F0150 Vascular dementia without behavioral disturbance: Secondary | ICD-10-CM | POA: Diagnosis not present

## 2021-01-29 DIAGNOSIS — R059 Cough, unspecified: Secondary | ICD-10-CM

## 2021-01-29 DIAGNOSIS — J4489 Other specified chronic obstructive pulmonary disease: Secondary | ICD-10-CM

## 2021-01-29 DIAGNOSIS — E7800 Pure hypercholesterolemia, unspecified: Secondary | ICD-10-CM

## 2021-01-29 DIAGNOSIS — E43 Unspecified severe protein-calorie malnutrition: Secondary | ICD-10-CM | POA: Diagnosis not present

## 2021-01-29 MED ORDER — PREDNISONE 10 MG PO TABS: ORAL_TABLET | ORAL | 0 refills | Status: AC

## 2021-01-29 NOTE — Progress Notes (Signed)
[name], female   DOB: November 24, 1926, 87 y.o.   MRN: [id number]   Subjective:    [name], female    DOB: 14-Aug-1926, 87 y.o.   MRN: [id number]  This visit occurred during the SARS-CoV-2 public health emergency.  Safety protocols were in place, including screening questions prior to the visit, additional usage of staff PPE, and extensive cleaning of exam room while observing appropriate contact time as indicated for disinfecting solutions.   Patient here for work in appt.  Chief Complaint  Patient presents with   Cough   Fatigue   .   HPI Has known COPD.  Increased cough and congestion - more recently.  Accompanied by her daughter.  History obtained from both of them - mostly her daughter [name]).  Some days better than others.  More alert at times. Sleeps a lot.  Hospice seeing her.  Has lorazepam if needed.  Daughter does not feel needs at this time. Does not appear to be agitated.  No chest pain.  Has oxygen - trying to get used to it.  Trying to get her to eat.  Ensure if possible.  No nausea or vomiting.  Bowels moving.    Past Medical History:  Diagnosis Date   Asthma    Chronic bronchitis (HCC)    COPD (chronic obstructive pulmonary disease) ([place])    uses inhalers   Depression    Diabetes mellitus ([place])    GERD (gastroesophageal reflux disease)    hiatal hernia   Gout    Hematuria    followed by urology   History of colon polyps    History of frequent urinary tract infections    Humeral fracture 9/09   comminuted impacted proximal   Hypercholesterolemia    Hypertension    Hypothyroidism    Peripheral vascular disease ([place])    Sleep apnea    does not use cpap as it did not benefit her   Past Surgical History:  Procedure Laterality Date   BLADDER SUSPENSION  12/08/99   BREAST BIOPSY Right 1960's   CARPAL TUNNEL RELEASE Right 03/17/2017   Procedure: CARPAL TUNNEL RELEASE;  Surgeon: Hessie Knows, MD;  Location: [facility] OR[facility];  Service:  Orthopedics;  Laterality: Right;   CHOLECYSTECTOMY  1994   COLONOSCOPY W/ POLYPECTOMY     EYE SURGERY Bilateral    cataract extractions   KNEE SURGERY Right 2013   arthroscopy   ORIF WRIST FRACTURE Right 03/17/2017   Procedure: OPEN REDUCTION INTERNAL FIXATION (ORIF) WRIST FRACTURE;  Surgeon: Hessie Knows, MD;  Location: [facility] OR[facility];  Service: Orthopedics;  Laterality: Right;   Family History  Problem Relation Age of Onset   Hypertension Father    CVA Father    CAD Father    Hypertension Mother    Colitis Mother    CAD Mother    Breast cancer Neg Hx    Colon cancer Neg Hx    Social History   Socioeconomic History   Marital status: Widowed    Spouse name: Not on file   Number of children: 2   Years of education: Not on file   Highest education level: Not on file  Occupational History   Not on file  Tobacco Use   Smoking status: Never   Smokeless tobacco: Never  Vaping Use   Vaping Use: Never used  Substance and Sexual Activity   Alcohol use: No    Alcohol/week: 0.0 standard drinks   Drug use: No  Sexual activity: Never  Other Topics Concern   Not on file  Social History Narrative   Not on file   Social Determinants of Health   Financial Resource Strain: Not on file  Food Insecurity: Not on file  Transportation Needs: Not on file  Physical Activity: Not on file  Stress: Not on file  Social Connections: Not on file    Review of Systems  Constitutional:  Positive for fatigue.       Some days better than others - regarding eating.    HENT:  Negative for sinus pressure.   Respiratory:  Positive for cough and shortness of breath. Negative for chest tightness.   Cardiovascular:  Negative for chest pain, palpitations and leg swelling.  Gastrointestinal:  Negative for abdominal pain, diarrhea, nausea and vomiting.  Genitourinary:  Negative for difficulty urinating and dysuria.  Musculoskeletal:  Negative for joint swelling and myalgias.  Neurological:  Negative  for dizziness, light-headedness and headaches.  Psychiatric/Behavioral:  Negative for agitation and dysphoric mood.       Objective:     BP 120/70   Pulse 92   Temp 98 F (36.7 C) (Oral)   Resp 16   Ht $R'5\' 6"'Xa$  (1.676 m)   Wt 134 lb 6.4 oz (61 kg)   SpO2 96%   BMI 21.69 kg/m  Wt Readings from Last 3 Encounters:  01/29/21 134 lb 6.4 oz (61 kg)  12/09/20 141 lb (64 kg)  11/18/20 144 lb 10 oz (65.6 kg)    Physical Exam Vitals reviewed.  Constitutional:      General: She is not in acute distress.    Appearance: Normal appearance.  HENT:     Head: Normocephalic and atraumatic.     Right Ear: External ear normal.     Left Ear: External ear normal.  Eyes:     General: No scleral icterus.       Right eye: No discharge.        Left eye: No discharge.     Conjunctiva/sclera: Conjunctivae normal.  Neck:     Thyroid: No thyromegaly.  Cardiovascular:     Rate and Rhythm: Normal rate and regular rhythm.  Pulmonary:     Effort: No respiratory distress.     Comments: Some increased cough with forced expiration.  Abdominal:     General: Bowel sounds are normal.     Palpations: Abdomen is soft.     Tenderness: There is no abdominal tenderness.  Musculoskeletal:        General: No swelling or tenderness.     Cervical back: Neck supple. No tenderness.  Lymphadenopathy:     Cervical: No cervical adenopathy.  Skin:    Findings: No erythema or rash.  Neurological:     Mental Status: She is alert.  Psychiatric:        Mood and Affect: Mood normal.        Behavior: Behavior normal.     Outpatient Encounter Medications as of 01/29/2021  Medication Sig   predniSONE (DELTASONE) 10 MG tablet Take 4 tablets x 1 day and then decrease by 1/2 tablet per day until down to zero mg.   acetaminophen (TYLENOL) 325 MG tablet Take 650 mg by mouth every 6 (six) hours as needed.   albuterol (VENTOLIN HFA) 108 (90 Base) MCG/ACT inhaler Inhale 2 puffs into the lungs every 6 (six) hours as needed  for wheezi ng or shortness of breath.   allopurinol (ZYLOPRIM) 100 MG tablet One tab BY MOUTH  daily   cetirizine (ZYRTEC) 10 MG tablet Take 10 mg by mouth daily.   FLUoxetine (PROZAC) 40 MG capsule Take 1 capsule (40 mg total) by mouth daily.   guaiFENesin (MUCINEX) 600 MG 12 hr tablet Take by mouth 2 (two) times daily as needed.   ipratropium-albuterol (DUONEB) 0.5-2.5 (3) MG/3ML SOLN Take by nebulization.   levothyroxine (SYNTHROID) 50 MCG tablet Take 1 tablet (50 mcg total) by mouth daily before breakfast.   LORazepam (ATIVAN) 0.5 MG tablet Take 0.5 mg by mouth 4 (four) times daily as needed.   methenamine (HIPREX) 1 g tablet Take 1 tablet (1 g total) by mouth 2 (two) times daily with a meal.   montelukast (SINGULAIR) 10 MG tablet Take 1 tablet (10 mg total) by mouth at bedtime.   nystatin (MYCOSTATIN/NYSTOP) powder Apply topically 2 (two) times daily.   nystatin cream (MYCOSTATIN) Apply 1 application topically 2 (two) times daily.   pantoprazole (PROTONIX) 40 MG tablet Take 1 tablet (40 mg total) by mouth daily.   rivastigmine (EXELON) 1.5 MG capsule Take 1.5 mg by mouth 2 (two) times daily.   simvastatin (ZOCOR) 20 MG tablet Take 1 tablet (20 mg total) by mouth daily.   [DISCONTINUED] Calcium Carbonate-Vitamin D 600-400 MG-UNIT tablet Take 1 tablet by mouth daily.    [DISCONTINUED] Cholecalciferol (VITAMIN D-3) 1000 UNITS CAPS Take 1 capsule by mouth daily.   [DISCONTINUED] Cranberry-Vitamin C-Vitamin E (CRANBERRY PLUS VITAMIN C) 4200-20-3 MG-MG-UNIT CAPS Take 1 capsule by mouth daily.   [DISCONTINUED] fluticasone (FLONASE) 50 MCG/ACT nasal spray Place 1 spray into both nostrils daily.   [DISCONTINUED] fluticasone-salmeterol (ADVAIR DISKUS) 500-50 MCG/ACT AEPB Inhale 1 puff into the lungs 2 (two) times daily.   [DISCONTINUED] tolterodine (DETROL) 2 MG tablet Take 1 tablet (2 mg total) by mouth 2 (two) times daily.   No facility-administered encounter medications on file as of 01/29/2021.      Lab Results  Component Value Date   WBC 9.0 11/18/2020   HGB 10.9 (L) 11/18/2020   HCT 33.6 (L) 11/18/2020   PLT 244 11/18/2020   GLUCOSE 170 (H) 11/18/2020   CHOL 124 10/16/2020   TRIG 103.0 10/16/2020   HDL 44.20 10/16/2020   LDLCALC 59 10/16/2020   ALT 14 11/18/2020   AST 28 11/18/2020   NA 139 11/18/2020   K 3.9 11/18/2020   CL 108 11/18/2020   CREATININE 1.18 (H) 11/18/2020   BUN 25 (H) 11/18/2020   CO2 24 11/18/2020   TSH 1.11 10/16/2020   HGBA1C 6.4 10/16/2020   MICROALBUR 8.8 (H) 07/10/2019    DG Chest 2 View  Result Date: 11/18/2020 CLINICAL DATA:  Recent syncopal episode EXAM: CHEST - 2 VIEW COMPARISON:  03/14/2018 FINDINGS: Cardiac shadow is stable. Aortic calcifications are again seen. The lungs are clear. Chronic interstitial changes are noted in the bases posteriorly stable from the prior exam. No acute bony abnormality is seen. IMPRESSION: Chronic fibrotic and interstitial changes in the bases posteriorly. No acute abnormality noted. Electronically Signed   By: Inez Catalina M.D.   On: 11/18/2020 15:30   CT Head Wo Contrast  Result Date: 11/18/2020 CLINICAL DATA:  Syncopal episode and subsequent fall. EXAM: CT HEAD WITHOUT CONTRAST TECHNIQUE: Contiguous axial images were obtained from the base of the skull through the vertex without intravenous contrast. COMPARISON:  November 08, 2013 FINDINGS: Brain: There is mild cerebral atrophy with widening of the extra-axial spaces and ventricular dilatation. There are areas of decreased attenuation within the white matter tracts of  the supratentorial brain, consistent with microvascular disease changes. Vascular: No hyperdense vessel or unexpected calcification. Skull: Normal. Negative for fracture or focal lesion. Sinuses/Orbits: No acute finding. Other: None. IMPRESSION: 1. Generalized cerebral atrophy. 2. No acute intracranial abnormality. Electronically Signed   By: Virgina Norfolk M.D.   On: 11/18/2020 18:07   CT Angio  Chest PE W and/or Wo Contrast  Result Date: 11/18/2020 CLINICAL DATA:  87 year old female with concern for pulmonary embolism. EXAM: CT ANGIOGRAPHY CHEST WITH CONTRAST TECHNIQUE: Multidetector CT imaging of the chest was performed using the standard protocol during bolus administration of intravenous contrast. Multiplanar CT image reconstructions and MIPs were obtained to evaluate the vascular anatomy. CONTRAST:  48m OMNIPAQUE IOHEXOL 350 MG/ML SOLN COMPARISON:  Chest radiograph dated 11/18/2020. FINDINGS: Cardiovascular: Borderline cardiomegaly. No pericardial effusion. Three-vessel coronary vascular calcification. There is advanced calcified and noncalcified plaque of the thoracic aorta. There is borderline dilatation of the ascending aorta measuring 4 cm in greatest axial diameter. The descending thoracic aorta is dilated measuring up to 4.9 cm. There is heterogeneous appearance of the contrast within the descending thoracic aorta, likely secondary to mixing artifact. No periaortic fluid collection. Evaluation of the pulmonary arteries is limited due to respiratory motion artifact and suboptimal opacification and visualization of the peripheral branches. No pulmonary artery embolus identified. Mediastinum/Nodes: Borderline enlarged right hilar lymph node measures 11 mm in short axis. Subcarinal lymph node measures 13 mm. The esophagus is grossly unremarkable. No mediastinal fluid collection. Lungs/Pleura: Background of emphysema. Patchy area of airspace opacity involving the right lower lobe may represent pneumonia or aspiration. No pleural effusion or pneumothorax. There is mucous content within the right lower lobe bronchus. The central airways are otherwise patent. Upper Abdomen: Cholecystectomy. Musculoskeletal: Osteopenia with degenerative changes of the spine. No acute osseous pathology. Review of the MIP images confirms the above findings. IMPRESSION: 1. No CT evidence of pulmonary embolism. 2.  Emphysema with patchy area of airspace opacity involving the right lower lobe may represent pneumonia or aspiration. Clinical correlation and follow-up to resolution recommended. 3. Dilated thoracic aorta measuring up to 4 cm in the ascending aorta and 4.9 cm in descending thoracic aorta. Recommend annual imaging followup by CTA or MRA. This recommendation follows 2010 ACCF/AHA/AATS/ACR/ASA/SCA/SCAI/SIR/STS/SVM Guidelines for the Diagnosis and Management of Patients with Thoracic Aortic Disease. Circulation. 2010; 121:: D322-G254 Aortic aneurysm NOS (ICD10-I71.9) 4. Aortic Atherosclerosis (ICD10-I70.0) and Emphysema (ICD10-J43.9). Electronically Signed   By: AAnner CreteM.D.   On: 11/18/2020 18:00   CT Cervical Spine Wo Contrast  Result Date: 11/18/2020 CLINICAL DATA:  Syncopal episode. EXAM: CT CERVICAL SPINE WITHOUT CONTRAST TECHNIQUE: Multidetector CT imaging of the cervical spine was performed without intravenous contrast. Multiplanar CT image reconstructions were also generated. COMPARISON:  None. FINDINGS: Alignment: Approximately 1 mm anterolisthesis of C7 is noted on T1. Skull base and vertebrae: No acute fracture. No primary bone lesion or focal pathologic process. Soft tissues and spinal canal: No prevertebral fluid or swelling. No visible canal hematoma. Disc levels: There is mild anterior endplate sclerosis at the level of C3-C4. Marked severity endplate sclerosis is seen at the levels of C4-C5, C5-C6 and C6-C7. Marked severity intervertebral disc space narrowing is seen at the levels of C4-C5, C5-C6 and C6-C7. Bilateral marked severity multilevel facet joint hypertrophy is noted. Upper chest: Negative. Other: None. IMPRESSION: 1. No acute cervical spine fracture. 2. Marked severity multilevel degenerative changes. Electronically Signed   By: TVirgina NorfolkM.D.   On: 11/18/2020 18:03  Assessment & Plan:   Problem List Items Addressed This Visit     Aneurysm of thoracic aorta  ([place])    CT as outlined. Dilation of aorta as outlined.  Discussed last visit.  Discussed f/u (CTA/MRA).  Wanted to hold on any further testing or evaluation.  Hospice following now.       Chronic obstructive airway disease with asthma (HCC)    Breathing varies. Some days better than others, but overall increased cough and congestion recently.  Continue advair.  Rescue inhaler if needed.  Prednisone taper as directed.  Follow.  Call with update.        Relevant Medications   ipratropium-albuterol (DUONEB) 0.5-2.5 (3) MG/3ML SOLN   predniSONE (DELTASONE) 10 MG tablet   CKD (chronic kidney disease) stage 3, GFR 30-59 ml/min (HCC)    Avoid antiinflammatories.  Stay hydrated.  Follow metabolic panel.       Cough    Breathing varies. Some days better than others, but overall increased cough and congestion recently.  Continue advair.  Rescue inhaler if needed.  Prednisone taper as directed.  Follow.  Call with update.        Diabetes mellitus with peripheral vascular disease ([place])    On no medication.  Follow met b and a1c.        GERD (gastroesophageal reflux disease)    No upper symptoms.  Controlled on protonix.       Hypercholesterolemia    Continue simvastatin. Follow lipid panel and liver function tests.        Mixed Alzheimer's and vascular dementia ([place])    Evaluated by neurology.  Off aricept. Intolerant - vivid dreams.  Follow.       Relevant Medications   LORazepam (ATIVAN) 0.5 MG tablet   Peripheral vascular disease ([place])    Has known varying pressures in each arm.  Continue simvastatin.  No pain.  Follow.       Weight loss    Losing weight.  Continue to encourage increased po intake.  Encourage ensure supplements.  Follow.         Einar Pheasant, MD

## 2021-02-01 ENCOUNTER — Encounter: Payer: Self-pay | Admitting: Internal Medicine

## 2021-02-01 NOTE — Assessment & Plan Note (Signed)
Breathing varies. Some days better than others, but overall increased cough and congestion recently.  Continue advair.  Rescue inhaler if needed.  Prednisone taper as directed.  Follow.  Call with update.   

## 2021-02-01 NOTE — Assessment & Plan Note (Signed)
CT as outlined. Dilation of aorta as outlined.  Discussed last visit.  Discussed f/u (CTA/MRA).  Wanted to hold on any further testing or evaluation.  Hospice following now.

## 2021-02-01 NOTE — Assessment & Plan Note (Signed)
No upper symptoms.  Controlled on protonix.  

## 2021-02-01 NOTE — Assessment & Plan Note (Signed)
Continue simvastatin.  Follow lipid panel and liver function tests.   

## 2021-02-01 NOTE — Assessment & Plan Note (Signed)
Avoid antiinflammatories.  Stay hydrated.  Follow metabolic panel.   

## 2021-02-01 NOTE — Assessment & Plan Note (Signed)
Breathing varies. Some days better than others, but overall increased cough and congestion recently.  Continue advair.  Rescue inhaler if needed.  Prednisone taper as directed.  Follow.  Call with update.

## 2021-02-01 NOTE — Assessment & Plan Note (Signed)
Has known varying pressures in each arm.  Continue simvastatin.  No pain.  Follow.  

## 2021-02-01 NOTE — Assessment & Plan Note (Signed)
On no medication.  Follow met b and a1c.  

## 2021-02-01 NOTE — Assessment & Plan Note (Signed)
Evaluated by neurology.  Off aricept. Intolerant - vivid dreams.  Follow.  

## 2021-02-01 NOTE — Assessment & Plan Note (Signed)
Losing weight.  Continue to encourage increased po intake.  Encourage ensure supplements.  Follow.

## 2021-02-03 DIAGNOSIS — J449 Chronic obstructive pulmonary disease, unspecified: Secondary | ICD-10-CM | POA: Diagnosis not present

## 2021-02-03 DIAGNOSIS — F0150 Vascular dementia without behavioral disturbance: Secondary | ICD-10-CM | POA: Diagnosis not present

## 2021-02-03 DIAGNOSIS — E1151 Type 2 diabetes mellitus with diabetic peripheral angiopathy without gangrene: Secondary | ICD-10-CM | POA: Diagnosis not present

## 2021-02-03 DIAGNOSIS — F0280 Dementia in other diseases classified elsewhere without behavioral disturbance: Secondary | ICD-10-CM | POA: Diagnosis not present

## 2021-02-03 DIAGNOSIS — G309 Alzheimer's disease, unspecified: Secondary | ICD-10-CM | POA: Diagnosis not present

## 2021-02-03 DIAGNOSIS — E43 Unspecified severe protein-calorie malnutrition: Secondary | ICD-10-CM | POA: Diagnosis not present

## 2021-02-09 ENCOUNTER — Encounter: Payer: Self-pay | Admitting: Internal Medicine

## 2021-02-11 DIAGNOSIS — F0150 Vascular dementia without behavioral disturbance: Secondary | ICD-10-CM | POA: Diagnosis not present

## 2021-02-11 DIAGNOSIS — F0280 Dementia in other diseases classified elsewhere without behavioral disturbance: Secondary | ICD-10-CM | POA: Diagnosis not present

## 2021-02-11 DIAGNOSIS — G309 Alzheimer's disease, unspecified: Secondary | ICD-10-CM | POA: Diagnosis not present

## 2021-02-11 DIAGNOSIS — J449 Chronic obstructive pulmonary disease, unspecified: Secondary | ICD-10-CM | POA: Diagnosis not present

## 2021-02-11 DIAGNOSIS — E43 Unspecified severe protein-calorie malnutrition: Secondary | ICD-10-CM | POA: Diagnosis not present

## 2021-02-11 DIAGNOSIS — E1151 Type 2 diabetes mellitus with diabetic peripheral angiopathy without gangrene: Secondary | ICD-10-CM | POA: Diagnosis not present

## 2021-02-18 DIAGNOSIS — F0150 Vascular dementia without behavioral disturbance: Secondary | ICD-10-CM | POA: Diagnosis not present

## 2021-02-18 DIAGNOSIS — E43 Unspecified severe protein-calorie malnutrition: Secondary | ICD-10-CM | POA: Diagnosis not present

## 2021-02-18 DIAGNOSIS — G309 Alzheimer's disease, unspecified: Secondary | ICD-10-CM | POA: Diagnosis not present

## 2021-02-18 DIAGNOSIS — E1151 Type 2 diabetes mellitus with diabetic peripheral angiopathy without gangrene: Secondary | ICD-10-CM | POA: Diagnosis not present

## 2021-02-18 DIAGNOSIS — F0280 Dementia in other diseases classified elsewhere without behavioral disturbance: Secondary | ICD-10-CM | POA: Diagnosis not present

## 2021-02-18 DIAGNOSIS — J449 Chronic obstructive pulmonary disease, unspecified: Secondary | ICD-10-CM | POA: Diagnosis not present

## 2021-02-19 DIAGNOSIS — J449 Chronic obstructive pulmonary disease, unspecified: Secondary | ICD-10-CM | POA: Diagnosis not present

## 2021-02-19 DIAGNOSIS — E43 Unspecified severe protein-calorie malnutrition: Secondary | ICD-10-CM | POA: Diagnosis not present

## 2021-02-19 DIAGNOSIS — F0280 Dementia in other diseases classified elsewhere without behavioral disturbance: Secondary | ICD-10-CM | POA: Diagnosis not present

## 2021-02-19 DIAGNOSIS — F0150 Vascular dementia without behavioral disturbance: Secondary | ICD-10-CM | POA: Diagnosis not present

## 2021-02-19 DIAGNOSIS — E1151 Type 2 diabetes mellitus with diabetic peripheral angiopathy without gangrene: Secondary | ICD-10-CM | POA: Diagnosis not present

## 2021-02-19 DIAGNOSIS — G309 Alzheimer's disease, unspecified: Secondary | ICD-10-CM | POA: Diagnosis not present

## 2021-02-21 DIAGNOSIS — Z87440 Personal history of urinary (tract) infections: Secondary | ICD-10-CM | POA: Diagnosis not present

## 2021-02-21 DIAGNOSIS — D631 Anemia in chronic kidney disease: Secondary | ICD-10-CM | POA: Diagnosis not present

## 2021-02-21 DIAGNOSIS — I712 Thoracic aortic aneurysm, without rupture, unspecified: Secondary | ICD-10-CM | POA: Diagnosis not present

## 2021-02-21 DIAGNOSIS — N1832 Chronic kidney disease, stage 3b: Secondary | ICD-10-CM | POA: Diagnosis not present

## 2021-02-21 DIAGNOSIS — E039 Hypothyroidism, unspecified: Secondary | ICD-10-CM | POA: Diagnosis not present

## 2021-02-21 DIAGNOSIS — G309 Alzheimer's disease, unspecified: Secondary | ICD-10-CM | POA: Diagnosis not present

## 2021-02-21 DIAGNOSIS — M109 Gout, unspecified: Secondary | ICD-10-CM | POA: Diagnosis not present

## 2021-02-21 DIAGNOSIS — L308 Other specified dermatitis: Secondary | ICD-10-CM | POA: Diagnosis not present

## 2021-02-21 DIAGNOSIS — G4733 Obstructive sleep apnea (adult) (pediatric): Secondary | ICD-10-CM | POA: Diagnosis not present

## 2021-02-21 DIAGNOSIS — F0150 Vascular dementia without behavioral disturbance: Secondary | ICD-10-CM | POA: Diagnosis not present

## 2021-02-21 DIAGNOSIS — E43 Unspecified severe protein-calorie malnutrition: Secondary | ICD-10-CM | POA: Diagnosis not present

## 2021-02-21 DIAGNOSIS — R634 Abnormal weight loss: Secondary | ICD-10-CM | POA: Diagnosis not present

## 2021-02-21 DIAGNOSIS — F32A Depression, unspecified: Secondary | ICD-10-CM | POA: Diagnosis not present

## 2021-02-21 DIAGNOSIS — J302 Other seasonal allergic rhinitis: Secondary | ICD-10-CM | POA: Diagnosis not present

## 2021-02-21 DIAGNOSIS — E8809 Other disorders of plasma-protein metabolism, not elsewhere classified: Secondary | ICD-10-CM | POA: Diagnosis not present

## 2021-02-21 DIAGNOSIS — L89151 Pressure ulcer of sacral region, stage 1: Secondary | ICD-10-CM | POA: Diagnosis not present

## 2021-02-21 DIAGNOSIS — E1151 Type 2 diabetes mellitus with diabetic peripheral angiopathy without gangrene: Secondary | ICD-10-CM | POA: Diagnosis not present

## 2021-02-21 DIAGNOSIS — F0280 Dementia in other diseases classified elsewhere without behavioral disturbance: Secondary | ICD-10-CM | POA: Diagnosis not present

## 2021-02-21 DIAGNOSIS — K219 Gastro-esophageal reflux disease without esophagitis: Secondary | ICD-10-CM | POA: Diagnosis not present

## 2021-02-21 DIAGNOSIS — F419 Anxiety disorder, unspecified: Secondary | ICD-10-CM | POA: Diagnosis not present

## 2021-02-21 DIAGNOSIS — J449 Chronic obstructive pulmonary disease, unspecified: Secondary | ICD-10-CM | POA: Diagnosis not present

## 2021-02-21 DIAGNOSIS — Z6822 Body mass index (BMI) 22.0-22.9, adult: Secondary | ICD-10-CM | POA: Diagnosis not present

## 2021-02-21 DIAGNOSIS — E1122 Type 2 diabetes mellitus with diabetic chronic kidney disease: Secondary | ICD-10-CM | POA: Diagnosis not present

## 2021-02-21 DIAGNOSIS — L89892 Pressure ulcer of other site, stage 2: Secondary | ICD-10-CM | POA: Diagnosis not present

## 2021-02-21 DIAGNOSIS — I129 Hypertensive chronic kidney disease with stage 1 through stage 4 chronic kidney disease, or unspecified chronic kidney disease: Secondary | ICD-10-CM | POA: Diagnosis not present

## 2021-02-25 DIAGNOSIS — F0280 Dementia in other diseases classified elsewhere without behavioral disturbance: Secondary | ICD-10-CM | POA: Diagnosis not present

## 2021-02-25 DIAGNOSIS — J449 Chronic obstructive pulmonary disease, unspecified: Secondary | ICD-10-CM | POA: Diagnosis not present

## 2021-02-25 DIAGNOSIS — F0150 Vascular dementia without behavioral disturbance: Secondary | ICD-10-CM | POA: Diagnosis not present

## 2021-02-25 DIAGNOSIS — E43 Unspecified severe protein-calorie malnutrition: Secondary | ICD-10-CM | POA: Diagnosis not present

## 2021-02-25 DIAGNOSIS — E1151 Type 2 diabetes mellitus with diabetic peripheral angiopathy without gangrene: Secondary | ICD-10-CM | POA: Diagnosis not present

## 2021-02-25 DIAGNOSIS — G309 Alzheimer's disease, unspecified: Secondary | ICD-10-CM | POA: Diagnosis not present

## 2021-03-03 DIAGNOSIS — E43 Unspecified severe protein-calorie malnutrition: Secondary | ICD-10-CM | POA: Diagnosis not present

## 2021-03-03 DIAGNOSIS — G309 Alzheimer's disease, unspecified: Secondary | ICD-10-CM | POA: Diagnosis not present

## 2021-03-03 DIAGNOSIS — F0280 Dementia in other diseases classified elsewhere without behavioral disturbance: Secondary | ICD-10-CM | POA: Diagnosis not present

## 2021-03-03 DIAGNOSIS — F0150 Vascular dementia without behavioral disturbance: Secondary | ICD-10-CM | POA: Diagnosis not present

## 2021-03-03 DIAGNOSIS — J449 Chronic obstructive pulmonary disease, unspecified: Secondary | ICD-10-CM | POA: Diagnosis not present

## 2021-03-03 DIAGNOSIS — E1151 Type 2 diabetes mellitus with diabetic peripheral angiopathy without gangrene: Secondary | ICD-10-CM | POA: Diagnosis not present

## 2021-03-05 DIAGNOSIS — F0280 Dementia in other diseases classified elsewhere without behavioral disturbance: Secondary | ICD-10-CM | POA: Diagnosis not present

## 2021-03-05 DIAGNOSIS — F0150 Vascular dementia without behavioral disturbance: Secondary | ICD-10-CM | POA: Diagnosis not present

## 2021-03-05 DIAGNOSIS — E1151 Type 2 diabetes mellitus with diabetic peripheral angiopathy without gangrene: Secondary | ICD-10-CM | POA: Diagnosis not present

## 2021-03-05 DIAGNOSIS — E43 Unspecified severe protein-calorie malnutrition: Secondary | ICD-10-CM | POA: Diagnosis not present

## 2021-03-05 DIAGNOSIS — J449 Chronic obstructive pulmonary disease, unspecified: Secondary | ICD-10-CM | POA: Diagnosis not present

## 2021-03-05 DIAGNOSIS — G309 Alzheimer's disease, unspecified: Secondary | ICD-10-CM | POA: Diagnosis not present

## 2021-03-09 ENCOUNTER — Telehealth: Payer: Self-pay | Admitting: Internal Medicine

## 2021-03-09 DIAGNOSIS — Z23 Encounter for immunization: Secondary | ICD-10-CM | POA: Diagnosis not present

## 2021-03-09 NOTE — Telephone Encounter (Signed)
Ok.  Let us know if anything more needed.

## 2021-03-09 NOTE — Telephone Encounter (Signed)
noted 

## 2021-03-09 NOTE — Telephone Encounter (Signed)
FYI for you. Will update immunizations once flu shot is given.

## 2021-03-09 NOTE — Telephone Encounter (Signed)
Mitzy from [facility] called in stating that the patient is bed bound an d is requesting to have a flu shot.She would like for Dr.Scott to know that she requested a flu shot from the patients pharmacy and they will give the patient a flu shot this week.If you have any questions please call her at [phone].

## 2021-03-10 DIAGNOSIS — E43 Unspecified severe protein-calorie malnutrition: Secondary | ICD-10-CM | POA: Diagnosis not present

## 2021-03-10 DIAGNOSIS — E1151 Type 2 diabetes mellitus with diabetic peripheral angiopathy without gangrene: Secondary | ICD-10-CM | POA: Diagnosis not present

## 2021-03-10 DIAGNOSIS — F0280 Dementia in other diseases classified elsewhere without behavioral disturbance: Secondary | ICD-10-CM | POA: Diagnosis not present

## 2021-03-10 DIAGNOSIS — J449 Chronic obstructive pulmonary disease, unspecified: Secondary | ICD-10-CM | POA: Diagnosis not present

## 2021-03-10 DIAGNOSIS — F0150 Vascular dementia without behavioral disturbance: Secondary | ICD-10-CM | POA: Diagnosis not present

## 2021-03-10 DIAGNOSIS — G309 Alzheimer's disease, unspecified: Secondary | ICD-10-CM | POA: Diagnosis not present

## 2021-03-13 DIAGNOSIS — E1151 Type 2 diabetes mellitus with diabetic peripheral angiopathy without gangrene: Secondary | ICD-10-CM | POA: Diagnosis not present

## 2021-03-13 DIAGNOSIS — J449 Chronic obstructive pulmonary disease, unspecified: Secondary | ICD-10-CM | POA: Diagnosis not present

## 2021-03-13 DIAGNOSIS — F0150 Vascular dementia without behavioral disturbance: Secondary | ICD-10-CM | POA: Diagnosis not present

## 2021-03-13 DIAGNOSIS — G309 Alzheimer's disease, unspecified: Secondary | ICD-10-CM | POA: Diagnosis not present

## 2021-03-13 DIAGNOSIS — F0280 Dementia in other diseases classified elsewhere without behavioral disturbance: Secondary | ICD-10-CM | POA: Diagnosis not present

## 2021-03-13 DIAGNOSIS — E43 Unspecified severe protein-calorie malnutrition: Secondary | ICD-10-CM | POA: Diagnosis not present

## 2021-03-20 DIAGNOSIS — F0280 Dementia in other diseases classified elsewhere without behavioral disturbance: Secondary | ICD-10-CM | POA: Diagnosis not present

## 2021-03-20 DIAGNOSIS — E43 Unspecified severe protein-calorie malnutrition: Secondary | ICD-10-CM | POA: Diagnosis not present

## 2021-03-20 DIAGNOSIS — J449 Chronic obstructive pulmonary disease, unspecified: Secondary | ICD-10-CM | POA: Diagnosis not present

## 2021-03-20 DIAGNOSIS — G309 Alzheimer's disease, unspecified: Secondary | ICD-10-CM | POA: Diagnosis not present

## 2021-03-20 DIAGNOSIS — F0150 Vascular dementia without behavioral disturbance: Secondary | ICD-10-CM | POA: Diagnosis not present

## 2021-03-20 DIAGNOSIS — E1151 Type 2 diabetes mellitus with diabetic peripheral angiopathy without gangrene: Secondary | ICD-10-CM | POA: Diagnosis not present

## 2021-03-24 DIAGNOSIS — Z87440 Personal history of urinary (tract) infections: Secondary | ICD-10-CM | POA: Diagnosis not present

## 2021-03-24 DIAGNOSIS — D631 Anemia in chronic kidney disease: Secondary | ICD-10-CM | POA: Diagnosis not present

## 2021-03-24 DIAGNOSIS — E8809 Other disorders of plasma-protein metabolism, not elsewhere classified: Secondary | ICD-10-CM | POA: Diagnosis not present

## 2021-03-24 DIAGNOSIS — L89892 Pressure ulcer of other site, stage 2: Secondary | ICD-10-CM | POA: Diagnosis not present

## 2021-03-24 DIAGNOSIS — R32 Unspecified urinary incontinence: Secondary | ICD-10-CM | POA: Diagnosis not present

## 2021-03-24 DIAGNOSIS — F0283 Dementia in other diseases classified elsewhere, unspecified severity, with mood disturbance: Secondary | ICD-10-CM | POA: Diagnosis not present

## 2021-03-24 DIAGNOSIS — N1832 Chronic kidney disease, stage 3b: Secondary | ICD-10-CM | POA: Diagnosis not present

## 2021-03-24 DIAGNOSIS — F0284 Dementia in other diseases classified elsewhere, unspecified severity, with anxiety: Secondary | ICD-10-CM | POA: Diagnosis not present

## 2021-03-24 DIAGNOSIS — J9691 Respiratory failure, unspecified with hypoxia: Secondary | ICD-10-CM | POA: Diagnosis not present

## 2021-03-24 DIAGNOSIS — G309 Alzheimer's disease, unspecified: Secondary | ICD-10-CM | POA: Diagnosis not present

## 2021-03-24 DIAGNOSIS — J449 Chronic obstructive pulmonary disease, unspecified: Secondary | ICD-10-CM | POA: Diagnosis not present

## 2021-03-24 DIAGNOSIS — E1151 Type 2 diabetes mellitus with diabetic peripheral angiopathy without gangrene: Secondary | ICD-10-CM | POA: Diagnosis not present

## 2021-03-24 DIAGNOSIS — L89151 Pressure ulcer of sacral region, stage 1: Secondary | ICD-10-CM | POA: Diagnosis not present

## 2021-03-24 DIAGNOSIS — F0154 Vascular dementia, unspecified severity, with anxiety: Secondary | ICD-10-CM | POA: Diagnosis not present

## 2021-03-24 DIAGNOSIS — I129 Hypertensive chronic kidney disease with stage 1 through stage 4 chronic kidney disease, or unspecified chronic kidney disease: Secondary | ICD-10-CM | POA: Diagnosis not present

## 2021-03-24 DIAGNOSIS — L308 Other specified dermatitis: Secondary | ICD-10-CM | POA: Diagnosis not present

## 2021-03-24 DIAGNOSIS — Z9981 Dependence on supplemental oxygen: Secondary | ICD-10-CM | POA: Diagnosis not present

## 2021-03-24 DIAGNOSIS — K219 Gastro-esophageal reflux disease without esophagitis: Secondary | ICD-10-CM | POA: Diagnosis not present

## 2021-03-24 DIAGNOSIS — I712 Thoracic aortic aneurysm, without rupture, unspecified: Secondary | ICD-10-CM | POA: Diagnosis not present

## 2021-03-24 DIAGNOSIS — R634 Abnormal weight loss: Secondary | ICD-10-CM | POA: Diagnosis not present

## 2021-03-24 DIAGNOSIS — R0601 Orthopnea: Secondary | ICD-10-CM | POA: Diagnosis not present

## 2021-03-24 DIAGNOSIS — E43 Unspecified severe protein-calorie malnutrition: Secondary | ICD-10-CM | POA: Diagnosis not present

## 2021-03-24 DIAGNOSIS — F0153 Vascular dementia, unspecified severity, with mood disturbance: Secondary | ICD-10-CM | POA: Diagnosis not present

## 2021-03-24 DIAGNOSIS — E1122 Type 2 diabetes mellitus with diabetic chronic kidney disease: Secondary | ICD-10-CM | POA: Diagnosis not present

## 2021-03-24 DIAGNOSIS — G4733 Obstructive sleep apnea (adult) (pediatric): Secondary | ICD-10-CM | POA: Diagnosis not present

## 2021-03-26 DIAGNOSIS — J449 Chronic obstructive pulmonary disease, unspecified: Secondary | ICD-10-CM | POA: Diagnosis not present

## 2021-03-26 DIAGNOSIS — J9691 Respiratory failure, unspecified with hypoxia: Secondary | ICD-10-CM | POA: Diagnosis not present

## 2021-03-26 DIAGNOSIS — E1151 Type 2 diabetes mellitus with diabetic peripheral angiopathy without gangrene: Secondary | ICD-10-CM | POA: Diagnosis not present

## 2021-03-26 DIAGNOSIS — G309 Alzheimer's disease, unspecified: Secondary | ICD-10-CM | POA: Diagnosis not present

## 2021-03-26 DIAGNOSIS — I129 Hypertensive chronic kidney disease with stage 1 through stage 4 chronic kidney disease, or unspecified chronic kidney disease: Secondary | ICD-10-CM | POA: Diagnosis not present

## 2021-03-26 DIAGNOSIS — E1122 Type 2 diabetes mellitus with diabetic chronic kidney disease: Secondary | ICD-10-CM | POA: Diagnosis not present

## 2021-03-30 DIAGNOSIS — I129 Hypertensive chronic kidney disease with stage 1 through stage 4 chronic kidney disease, or unspecified chronic kidney disease: Secondary | ICD-10-CM | POA: Diagnosis not present

## 2021-03-30 DIAGNOSIS — E1122 Type 2 diabetes mellitus with diabetic chronic kidney disease: Secondary | ICD-10-CM | POA: Diagnosis not present

## 2021-03-30 DIAGNOSIS — E1151 Type 2 diabetes mellitus with diabetic peripheral angiopathy without gangrene: Secondary | ICD-10-CM | POA: Diagnosis not present

## 2021-03-30 DIAGNOSIS — J449 Chronic obstructive pulmonary disease, unspecified: Secondary | ICD-10-CM | POA: Diagnosis not present

## 2021-03-30 DIAGNOSIS — G309 Alzheimer's disease, unspecified: Secondary | ICD-10-CM | POA: Diagnosis not present

## 2021-03-30 DIAGNOSIS — J9691 Respiratory failure, unspecified with hypoxia: Secondary | ICD-10-CM | POA: Diagnosis not present

## 2021-04-06 DIAGNOSIS — E1151 Type 2 diabetes mellitus with diabetic peripheral angiopathy without gangrene: Secondary | ICD-10-CM | POA: Diagnosis not present

## 2021-04-06 DIAGNOSIS — I129 Hypertensive chronic kidney disease with stage 1 through stage 4 chronic kidney disease, or unspecified chronic kidney disease: Secondary | ICD-10-CM | POA: Diagnosis not present

## 2021-04-06 DIAGNOSIS — E1122 Type 2 diabetes mellitus with diabetic chronic kidney disease: Secondary | ICD-10-CM | POA: Diagnosis not present

## 2021-04-06 DIAGNOSIS — G309 Alzheimer's disease, unspecified: Secondary | ICD-10-CM | POA: Diagnosis not present

## 2021-04-06 DIAGNOSIS — J449 Chronic obstructive pulmonary disease, unspecified: Secondary | ICD-10-CM | POA: Diagnosis not present

## 2021-04-06 DIAGNOSIS — J9691 Respiratory failure, unspecified with hypoxia: Secondary | ICD-10-CM | POA: Diagnosis not present

## 2021-04-07 DIAGNOSIS — J449 Chronic obstructive pulmonary disease, unspecified: Secondary | ICD-10-CM | POA: Diagnosis not present

## 2021-04-07 DIAGNOSIS — E1122 Type 2 diabetes mellitus with diabetic chronic kidney disease: Secondary | ICD-10-CM | POA: Diagnosis not present

## 2021-04-07 DIAGNOSIS — J9691 Respiratory failure, unspecified with hypoxia: Secondary | ICD-10-CM | POA: Diagnosis not present

## 2021-04-07 DIAGNOSIS — E1151 Type 2 diabetes mellitus with diabetic peripheral angiopathy without gangrene: Secondary | ICD-10-CM | POA: Diagnosis not present

## 2021-04-07 DIAGNOSIS — G309 Alzheimer's disease, unspecified: Secondary | ICD-10-CM | POA: Diagnosis not present

## 2021-04-07 DIAGNOSIS — I129 Hypertensive chronic kidney disease with stage 1 through stage 4 chronic kidney disease, or unspecified chronic kidney disease: Secondary | ICD-10-CM | POA: Diagnosis not present

## 2021-04-13 DIAGNOSIS — J449 Chronic obstructive pulmonary disease, unspecified: Secondary | ICD-10-CM | POA: Diagnosis not present

## 2021-04-13 DIAGNOSIS — G309 Alzheimer's disease, unspecified: Secondary | ICD-10-CM | POA: Diagnosis not present

## 2021-04-13 DIAGNOSIS — E1151 Type 2 diabetes mellitus with diabetic peripheral angiopathy without gangrene: Secondary | ICD-10-CM | POA: Diagnosis not present

## 2021-04-13 DIAGNOSIS — I129 Hypertensive chronic kidney disease with stage 1 through stage 4 chronic kidney disease, or unspecified chronic kidney disease: Secondary | ICD-10-CM | POA: Diagnosis not present

## 2021-04-13 DIAGNOSIS — J9691 Respiratory failure, unspecified with hypoxia: Secondary | ICD-10-CM | POA: Diagnosis not present

## 2021-04-13 DIAGNOSIS — E1122 Type 2 diabetes mellitus with diabetic chronic kidney disease: Secondary | ICD-10-CM | POA: Diagnosis not present

## 2021-04-21 DIAGNOSIS — J449 Chronic obstructive pulmonary disease, unspecified: Secondary | ICD-10-CM | POA: Diagnosis not present

## 2021-04-21 DIAGNOSIS — G309 Alzheimer's disease, unspecified: Secondary | ICD-10-CM | POA: Diagnosis not present

## 2021-04-21 DIAGNOSIS — E1122 Type 2 diabetes mellitus with diabetic chronic kidney disease: Secondary | ICD-10-CM | POA: Diagnosis not present

## 2021-04-21 DIAGNOSIS — J9691 Respiratory failure, unspecified with hypoxia: Secondary | ICD-10-CM | POA: Diagnosis not present

## 2021-04-21 DIAGNOSIS — I129 Hypertensive chronic kidney disease with stage 1 through stage 4 chronic kidney disease, or unspecified chronic kidney disease: Secondary | ICD-10-CM | POA: Diagnosis not present

## 2021-04-21 DIAGNOSIS — E1151 Type 2 diabetes mellitus with diabetic peripheral angiopathy without gangrene: Secondary | ICD-10-CM | POA: Diagnosis not present

## 2021-04-22 DIAGNOSIS — G309 Alzheimer's disease, unspecified: Secondary | ICD-10-CM | POA: Diagnosis not present

## 2021-04-22 DIAGNOSIS — J449 Chronic obstructive pulmonary disease, unspecified: Secondary | ICD-10-CM | POA: Diagnosis not present

## 2021-04-22 DIAGNOSIS — E1151 Type 2 diabetes mellitus with diabetic peripheral angiopathy without gangrene: Secondary | ICD-10-CM | POA: Diagnosis not present

## 2021-04-22 DIAGNOSIS — I129 Hypertensive chronic kidney disease with stage 1 through stage 4 chronic kidney disease, or unspecified chronic kidney disease: Secondary | ICD-10-CM | POA: Diagnosis not present

## 2021-04-22 DIAGNOSIS — J9691 Respiratory failure, unspecified with hypoxia: Secondary | ICD-10-CM | POA: Diagnosis not present

## 2021-04-22 DIAGNOSIS — E1122 Type 2 diabetes mellitus with diabetic chronic kidney disease: Secondary | ICD-10-CM | POA: Diagnosis not present

## 2021-04-23 DIAGNOSIS — D631 Anemia in chronic kidney disease: Secondary | ICD-10-CM | POA: Diagnosis not present

## 2021-04-23 DIAGNOSIS — L308 Other specified dermatitis: Secondary | ICD-10-CM | POA: Diagnosis not present

## 2021-04-23 DIAGNOSIS — E8809 Other disorders of plasma-protein metabolism, not elsewhere classified: Secondary | ICD-10-CM | POA: Diagnosis not present

## 2021-04-23 DIAGNOSIS — J9691 Respiratory failure, unspecified with hypoxia: Secondary | ICD-10-CM | POA: Diagnosis not present

## 2021-04-23 DIAGNOSIS — G309 Alzheimer's disease, unspecified: Secondary | ICD-10-CM | POA: Diagnosis not present

## 2021-04-23 DIAGNOSIS — K219 Gastro-esophageal reflux disease without esophagitis: Secondary | ICD-10-CM | POA: Diagnosis not present

## 2021-04-23 DIAGNOSIS — R0601 Orthopnea: Secondary | ICD-10-CM | POA: Diagnosis not present

## 2021-04-23 DIAGNOSIS — F0284 Dementia in other diseases classified elsewhere, unspecified severity, with anxiety: Secondary | ICD-10-CM | POA: Diagnosis not present

## 2021-04-23 DIAGNOSIS — E43 Unspecified severe protein-calorie malnutrition: Secondary | ICD-10-CM | POA: Diagnosis not present

## 2021-04-23 DIAGNOSIS — N1832 Chronic kidney disease, stage 3b: Secondary | ICD-10-CM | POA: Diagnosis not present

## 2021-04-23 DIAGNOSIS — E1122 Type 2 diabetes mellitus with diabetic chronic kidney disease: Secondary | ICD-10-CM | POA: Diagnosis not present

## 2021-04-23 DIAGNOSIS — E1151 Type 2 diabetes mellitus with diabetic peripheral angiopathy without gangrene: Secondary | ICD-10-CM | POA: Diagnosis not present

## 2021-04-23 DIAGNOSIS — Z87440 Personal history of urinary (tract) infections: Secondary | ICD-10-CM | POA: Diagnosis not present

## 2021-04-23 DIAGNOSIS — F0283 Dementia in other diseases classified elsewhere, unspecified severity, with mood disturbance: Secondary | ICD-10-CM | POA: Diagnosis not present

## 2021-04-23 DIAGNOSIS — J449 Chronic obstructive pulmonary disease, unspecified: Secondary | ICD-10-CM | POA: Diagnosis not present

## 2021-04-23 DIAGNOSIS — G4733 Obstructive sleep apnea (adult) (pediatric): Secondary | ICD-10-CM | POA: Diagnosis not present

## 2021-04-23 DIAGNOSIS — R32 Unspecified urinary incontinence: Secondary | ICD-10-CM | POA: Diagnosis not present

## 2021-04-23 DIAGNOSIS — L89892 Pressure ulcer of other site, stage 2: Secondary | ICD-10-CM | POA: Diagnosis not present

## 2021-04-23 DIAGNOSIS — L89151 Pressure ulcer of sacral region, stage 1: Secondary | ICD-10-CM | POA: Diagnosis not present

## 2021-04-23 DIAGNOSIS — R634 Abnormal weight loss: Secondary | ICD-10-CM | POA: Diagnosis not present

## 2021-04-23 DIAGNOSIS — I712 Thoracic aortic aneurysm, without rupture, unspecified: Secondary | ICD-10-CM | POA: Diagnosis not present

## 2021-04-23 DIAGNOSIS — I129 Hypertensive chronic kidney disease with stage 1 through stage 4 chronic kidney disease, or unspecified chronic kidney disease: Secondary | ICD-10-CM | POA: Diagnosis not present

## 2021-04-23 DIAGNOSIS — F0153 Vascular dementia, unspecified severity, with mood disturbance: Secondary | ICD-10-CM | POA: Diagnosis not present

## 2021-04-23 DIAGNOSIS — F0154 Vascular dementia, unspecified severity, with anxiety: Secondary | ICD-10-CM | POA: Diagnosis not present

## 2021-04-23 DIAGNOSIS — Z9981 Dependence on supplemental oxygen: Secondary | ICD-10-CM | POA: Diagnosis not present

## 2021-04-24 DIAGNOSIS — E1122 Type 2 diabetes mellitus with diabetic chronic kidney disease: Secondary | ICD-10-CM | POA: Diagnosis not present

## 2021-04-24 DIAGNOSIS — J449 Chronic obstructive pulmonary disease, unspecified: Secondary | ICD-10-CM | POA: Diagnosis not present

## 2021-04-24 DIAGNOSIS — I129 Hypertensive chronic kidney disease with stage 1 through stage 4 chronic kidney disease, or unspecified chronic kidney disease: Secondary | ICD-10-CM | POA: Diagnosis not present

## 2021-04-24 DIAGNOSIS — E1151 Type 2 diabetes mellitus with diabetic peripheral angiopathy without gangrene: Secondary | ICD-10-CM | POA: Diagnosis not present

## 2021-04-24 DIAGNOSIS — J9691 Respiratory failure, unspecified with hypoxia: Secondary | ICD-10-CM | POA: Diagnosis not present

## 2021-04-24 DIAGNOSIS — G309 Alzheimer's disease, unspecified: Secondary | ICD-10-CM | POA: Diagnosis not present

## 2021-04-28 ENCOUNTER — Telehealth: Payer: Self-pay | Admitting: Internal Medicine

## 2021-04-28 DIAGNOSIS — E1122 Type 2 diabetes mellitus with diabetic chronic kidney disease: Secondary | ICD-10-CM | POA: Diagnosis not present

## 2021-04-28 DIAGNOSIS — J9691 Respiratory failure, unspecified with hypoxia: Secondary | ICD-10-CM | POA: Diagnosis not present

## 2021-04-28 DIAGNOSIS — I129 Hypertensive chronic kidney disease with stage 1 through stage 4 chronic kidney disease, or unspecified chronic kidney disease: Secondary | ICD-10-CM | POA: Diagnosis not present

## 2021-04-28 DIAGNOSIS — E1151 Type 2 diabetes mellitus with diabetic peripheral angiopathy without gangrene: Secondary | ICD-10-CM | POA: Diagnosis not present

## 2021-04-28 DIAGNOSIS — G309 Alzheimer's disease, unspecified: Secondary | ICD-10-CM | POA: Diagnosis not present

## 2021-04-28 DIAGNOSIS — J449 Chronic obstructive pulmonary disease, unspecified: Secondary | ICD-10-CM | POA: Diagnosis not present

## 2021-04-28 NOTE — Telephone Encounter (Signed)
Mitzi from Hospice, [phone], patient needs a refill on allopurinol (ZYLOPRIM) 100 MG tablet. Mitzi is requesting to have permission to call in this medication now and any future refills with patient's other medications that are meant to be on going. Please call her back.

## 2021-04-29 NOTE — Telephone Encounter (Signed)
Message left for hospice nurse to call office.

## 2021-04-29 NOTE — Telephone Encounter (Signed)
Hospice nurse notified. 

## 2021-04-29 NOTE — Telephone Encounter (Signed)
Ok

## 2021-05-01 DIAGNOSIS — I129 Hypertensive chronic kidney disease with stage 1 through stage 4 chronic kidney disease, or unspecified chronic kidney disease: Secondary | ICD-10-CM | POA: Diagnosis not present

## 2021-05-01 DIAGNOSIS — J9691 Respiratory failure, unspecified with hypoxia: Secondary | ICD-10-CM | POA: Diagnosis not present

## 2021-05-01 DIAGNOSIS — E1122 Type 2 diabetes mellitus with diabetic chronic kidney disease: Secondary | ICD-10-CM | POA: Diagnosis not present

## 2021-05-01 DIAGNOSIS — J449 Chronic obstructive pulmonary disease, unspecified: Secondary | ICD-10-CM | POA: Diagnosis not present

## 2021-05-01 DIAGNOSIS — E1151 Type 2 diabetes mellitus with diabetic peripheral angiopathy without gangrene: Secondary | ICD-10-CM | POA: Diagnosis not present

## 2021-05-01 DIAGNOSIS — G309 Alzheimer's disease, unspecified: Secondary | ICD-10-CM | POA: Diagnosis not present

## 2021-05-05 DIAGNOSIS — E1122 Type 2 diabetes mellitus with diabetic chronic kidney disease: Secondary | ICD-10-CM | POA: Diagnosis not present

## 2021-05-05 DIAGNOSIS — J449 Chronic obstructive pulmonary disease, unspecified: Secondary | ICD-10-CM | POA: Diagnosis not present

## 2021-05-05 DIAGNOSIS — J9691 Respiratory failure, unspecified with hypoxia: Secondary | ICD-10-CM | POA: Diagnosis not present

## 2021-05-05 DIAGNOSIS — E1151 Type 2 diabetes mellitus with diabetic peripheral angiopathy without gangrene: Secondary | ICD-10-CM | POA: Diagnosis not present

## 2021-05-05 DIAGNOSIS — G309 Alzheimer's disease, unspecified: Secondary | ICD-10-CM | POA: Diagnosis not present

## 2021-05-05 DIAGNOSIS — I129 Hypertensive chronic kidney disease with stage 1 through stage 4 chronic kidney disease, or unspecified chronic kidney disease: Secondary | ICD-10-CM | POA: Diagnosis not present

## 2021-05-06 ENCOUNTER — Other Ambulatory Visit: Payer: Self-pay | Admitting: Internal Medicine

## 2021-05-06 DIAGNOSIS — G309 Alzheimer's disease, unspecified: Secondary | ICD-10-CM | POA: Diagnosis not present

## 2021-05-06 DIAGNOSIS — E1151 Type 2 diabetes mellitus with diabetic peripheral angiopathy without gangrene: Secondary | ICD-10-CM | POA: Diagnosis not present

## 2021-05-06 DIAGNOSIS — I129 Hypertensive chronic kidney disease with stage 1 through stage 4 chronic kidney disease, or unspecified chronic kidney disease: Secondary | ICD-10-CM | POA: Diagnosis not present

## 2021-05-06 DIAGNOSIS — J449 Chronic obstructive pulmonary disease, unspecified: Secondary | ICD-10-CM | POA: Diagnosis not present

## 2021-05-06 DIAGNOSIS — J9691 Respiratory failure, unspecified with hypoxia: Secondary | ICD-10-CM | POA: Diagnosis not present

## 2021-05-06 DIAGNOSIS — E1122 Type 2 diabetes mellitus with diabetic chronic kidney disease: Secondary | ICD-10-CM | POA: Diagnosis not present

## 2021-05-19 DIAGNOSIS — E1122 Type 2 diabetes mellitus with diabetic chronic kidney disease: Secondary | ICD-10-CM | POA: Diagnosis not present

## 2021-05-19 DIAGNOSIS — J449 Chronic obstructive pulmonary disease, unspecified: Secondary | ICD-10-CM | POA: Diagnosis not present

## 2021-05-19 DIAGNOSIS — I129 Hypertensive chronic kidney disease with stage 1 through stage 4 chronic kidney disease, or unspecified chronic kidney disease: Secondary | ICD-10-CM | POA: Diagnosis not present

## 2021-05-19 DIAGNOSIS — E1151 Type 2 diabetes mellitus with diabetic peripheral angiopathy without gangrene: Secondary | ICD-10-CM | POA: Diagnosis not present

## 2021-05-19 DIAGNOSIS — G309 Alzheimer's disease, unspecified: Secondary | ICD-10-CM | POA: Diagnosis not present

## 2021-05-19 DIAGNOSIS — J9691 Respiratory failure, unspecified with hypoxia: Secondary | ICD-10-CM | POA: Diagnosis not present

## 2021-05-22 DIAGNOSIS — G309 Alzheimer's disease, unspecified: Secondary | ICD-10-CM | POA: Diagnosis not present

## 2021-05-22 DIAGNOSIS — E1122 Type 2 diabetes mellitus with diabetic chronic kidney disease: Secondary | ICD-10-CM | POA: Diagnosis not present

## 2021-05-22 DIAGNOSIS — J9691 Respiratory failure, unspecified with hypoxia: Secondary | ICD-10-CM | POA: Diagnosis not present

## 2021-05-22 DIAGNOSIS — E1151 Type 2 diabetes mellitus with diabetic peripheral angiopathy without gangrene: Secondary | ICD-10-CM | POA: Diagnosis not present

## 2021-05-22 DIAGNOSIS — J449 Chronic obstructive pulmonary disease, unspecified: Secondary | ICD-10-CM | POA: Diagnosis not present

## 2021-05-22 DIAGNOSIS — I129 Hypertensive chronic kidney disease with stage 1 through stage 4 chronic kidney disease, or unspecified chronic kidney disease: Secondary | ICD-10-CM | POA: Diagnosis not present

## 2021-05-24 DIAGNOSIS — R634 Abnormal weight loss: Secondary | ICD-10-CM | POA: Diagnosis not present

## 2021-05-24 DIAGNOSIS — N1832 Chronic kidney disease, stage 3b: Secondary | ICD-10-CM | POA: Diagnosis not present

## 2021-05-24 DIAGNOSIS — I129 Hypertensive chronic kidney disease with stage 1 through stage 4 chronic kidney disease, or unspecified chronic kidney disease: Secondary | ICD-10-CM | POA: Diagnosis not present

## 2021-05-24 DIAGNOSIS — E1122 Type 2 diabetes mellitus with diabetic chronic kidney disease: Secondary | ICD-10-CM | POA: Diagnosis not present

## 2021-05-24 DIAGNOSIS — L308 Other specified dermatitis: Secondary | ICD-10-CM | POA: Diagnosis not present

## 2021-05-24 DIAGNOSIS — J9691 Respiratory failure, unspecified with hypoxia: Secondary | ICD-10-CM | POA: Diagnosis not present

## 2021-05-24 DIAGNOSIS — E1151 Type 2 diabetes mellitus with diabetic peripheral angiopathy without gangrene: Secondary | ICD-10-CM | POA: Diagnosis not present

## 2021-05-24 DIAGNOSIS — Z87440 Personal history of urinary (tract) infections: Secondary | ICD-10-CM | POA: Diagnosis not present

## 2021-05-24 DIAGNOSIS — R0601 Orthopnea: Secondary | ICD-10-CM | POA: Diagnosis not present

## 2021-05-24 DIAGNOSIS — F0283 Dementia in other diseases classified elsewhere, unspecified severity, with mood disturbance: Secondary | ICD-10-CM | POA: Diagnosis not present

## 2021-05-24 DIAGNOSIS — L89892 Pressure ulcer of other site, stage 2: Secondary | ICD-10-CM | POA: Diagnosis not present

## 2021-05-24 DIAGNOSIS — L89151 Pressure ulcer of sacral region, stage 1: Secondary | ICD-10-CM | POA: Diagnosis not present

## 2021-05-24 DIAGNOSIS — R32 Unspecified urinary incontinence: Secondary | ICD-10-CM | POA: Diagnosis not present

## 2021-05-24 DIAGNOSIS — F0154 Vascular dementia, unspecified severity, with anxiety: Secondary | ICD-10-CM | POA: Diagnosis not present

## 2021-05-24 DIAGNOSIS — Z9981 Dependence on supplemental oxygen: Secondary | ICD-10-CM | POA: Diagnosis not present

## 2021-05-24 DIAGNOSIS — F0153 Vascular dementia, unspecified severity, with mood disturbance: Secondary | ICD-10-CM | POA: Diagnosis not present

## 2021-05-24 DIAGNOSIS — J449 Chronic obstructive pulmonary disease, unspecified: Secondary | ICD-10-CM | POA: Diagnosis not present

## 2021-05-24 DIAGNOSIS — K219 Gastro-esophageal reflux disease without esophagitis: Secondary | ICD-10-CM | POA: Diagnosis not present

## 2021-05-24 DIAGNOSIS — E43 Unspecified severe protein-calorie malnutrition: Secondary | ICD-10-CM | POA: Diagnosis not present

## 2021-05-24 DIAGNOSIS — G309 Alzheimer's disease, unspecified: Secondary | ICD-10-CM | POA: Diagnosis not present

## 2021-05-24 DIAGNOSIS — F0284 Dementia in other diseases classified elsewhere, unspecified severity, with anxiety: Secondary | ICD-10-CM | POA: Diagnosis not present

## 2021-05-24 DIAGNOSIS — I712 Thoracic aortic aneurysm, without rupture, unspecified: Secondary | ICD-10-CM | POA: Diagnosis not present

## 2021-05-24 DIAGNOSIS — E8809 Other disorders of plasma-protein metabolism, not elsewhere classified: Secondary | ICD-10-CM | POA: Diagnosis not present

## 2021-05-24 DIAGNOSIS — D631 Anemia in chronic kidney disease: Secondary | ICD-10-CM | POA: Diagnosis not present

## 2021-05-24 DIAGNOSIS — G4733 Obstructive sleep apnea (adult) (pediatric): Secondary | ICD-10-CM | POA: Diagnosis not present

## 2021-05-26 DIAGNOSIS — G309 Alzheimer's disease, unspecified: Secondary | ICD-10-CM | POA: Diagnosis not present

## 2021-05-26 DIAGNOSIS — E1122 Type 2 diabetes mellitus with diabetic chronic kidney disease: Secondary | ICD-10-CM | POA: Diagnosis not present

## 2021-05-26 DIAGNOSIS — J449 Chronic obstructive pulmonary disease, unspecified: Secondary | ICD-10-CM | POA: Diagnosis not present

## 2021-05-26 DIAGNOSIS — E1151 Type 2 diabetes mellitus with diabetic peripheral angiopathy without gangrene: Secondary | ICD-10-CM | POA: Diagnosis not present

## 2021-05-26 DIAGNOSIS — J9691 Respiratory failure, unspecified with hypoxia: Secondary | ICD-10-CM | POA: Diagnosis not present

## 2021-05-26 DIAGNOSIS — I129 Hypertensive chronic kidney disease with stage 1 through stage 4 chronic kidney disease, or unspecified chronic kidney disease: Secondary | ICD-10-CM | POA: Diagnosis not present

## 2021-05-29 DIAGNOSIS — I129 Hypertensive chronic kidney disease with stage 1 through stage 4 chronic kidney disease, or unspecified chronic kidney disease: Secondary | ICD-10-CM | POA: Diagnosis not present

## 2021-05-29 DIAGNOSIS — G309 Alzheimer's disease, unspecified: Secondary | ICD-10-CM | POA: Diagnosis not present

## 2021-05-29 DIAGNOSIS — E1122 Type 2 diabetes mellitus with diabetic chronic kidney disease: Secondary | ICD-10-CM | POA: Diagnosis not present

## 2021-05-29 DIAGNOSIS — J9691 Respiratory failure, unspecified with hypoxia: Secondary | ICD-10-CM | POA: Diagnosis not present

## 2021-05-29 DIAGNOSIS — J449 Chronic obstructive pulmonary disease, unspecified: Secondary | ICD-10-CM | POA: Diagnosis not present

## 2021-05-29 DIAGNOSIS — E1151 Type 2 diabetes mellitus with diabetic peripheral angiopathy without gangrene: Secondary | ICD-10-CM | POA: Diagnosis not present

## 2021-06-01 DIAGNOSIS — J449 Chronic obstructive pulmonary disease, unspecified: Secondary | ICD-10-CM | POA: Diagnosis not present

## 2021-06-01 DIAGNOSIS — E1122 Type 2 diabetes mellitus with diabetic chronic kidney disease: Secondary | ICD-10-CM | POA: Diagnosis not present

## 2021-06-01 DIAGNOSIS — E1151 Type 2 diabetes mellitus with diabetic peripheral angiopathy without gangrene: Secondary | ICD-10-CM | POA: Diagnosis not present

## 2021-06-01 DIAGNOSIS — J9691 Respiratory failure, unspecified with hypoxia: Secondary | ICD-10-CM | POA: Diagnosis not present

## 2021-06-01 DIAGNOSIS — I129 Hypertensive chronic kidney disease with stage 1 through stage 4 chronic kidney disease, or unspecified chronic kidney disease: Secondary | ICD-10-CM | POA: Diagnosis not present

## 2021-06-01 DIAGNOSIS — G309 Alzheimer's disease, unspecified: Secondary | ICD-10-CM | POA: Diagnosis not present

## 2021-06-04 DIAGNOSIS — E1151 Type 2 diabetes mellitus with diabetic peripheral angiopathy without gangrene: Secondary | ICD-10-CM | POA: Diagnosis not present

## 2021-06-04 DIAGNOSIS — E1122 Type 2 diabetes mellitus with diabetic chronic kidney disease: Secondary | ICD-10-CM | POA: Diagnosis not present

## 2021-06-04 DIAGNOSIS — G309 Alzheimer's disease, unspecified: Secondary | ICD-10-CM | POA: Diagnosis not present

## 2021-06-04 DIAGNOSIS — J449 Chronic obstructive pulmonary disease, unspecified: Secondary | ICD-10-CM | POA: Diagnosis not present

## 2021-06-04 DIAGNOSIS — J9691 Respiratory failure, unspecified with hypoxia: Secondary | ICD-10-CM | POA: Diagnosis not present

## 2021-06-04 DIAGNOSIS — I129 Hypertensive chronic kidney disease with stage 1 through stage 4 chronic kidney disease, or unspecified chronic kidney disease: Secondary | ICD-10-CM | POA: Diagnosis not present

## 2021-06-12 DIAGNOSIS — E1151 Type 2 diabetes mellitus with diabetic peripheral angiopathy without gangrene: Secondary | ICD-10-CM | POA: Diagnosis not present

## 2021-06-12 DIAGNOSIS — G309 Alzheimer's disease, unspecified: Secondary | ICD-10-CM | POA: Diagnosis not present

## 2021-06-12 DIAGNOSIS — I129 Hypertensive chronic kidney disease with stage 1 through stage 4 chronic kidney disease, or unspecified chronic kidney disease: Secondary | ICD-10-CM | POA: Diagnosis not present

## 2021-06-12 DIAGNOSIS — J449 Chronic obstructive pulmonary disease, unspecified: Secondary | ICD-10-CM | POA: Diagnosis not present

## 2021-06-12 DIAGNOSIS — E1122 Type 2 diabetes mellitus with diabetic chronic kidney disease: Secondary | ICD-10-CM | POA: Diagnosis not present

## 2021-06-12 DIAGNOSIS — J9691 Respiratory failure, unspecified with hypoxia: Secondary | ICD-10-CM | POA: Diagnosis not present

## 2021-06-15 DIAGNOSIS — I129 Hypertensive chronic kidney disease with stage 1 through stage 4 chronic kidney disease, or unspecified chronic kidney disease: Secondary | ICD-10-CM | POA: Diagnosis not present

## 2021-06-15 DIAGNOSIS — J9691 Respiratory failure, unspecified with hypoxia: Secondary | ICD-10-CM | POA: Diagnosis not present

## 2021-06-15 DIAGNOSIS — J449 Chronic obstructive pulmonary disease, unspecified: Secondary | ICD-10-CM | POA: Diagnosis not present

## 2021-06-15 DIAGNOSIS — E1122 Type 2 diabetes mellitus with diabetic chronic kidney disease: Secondary | ICD-10-CM | POA: Diagnosis not present

## 2021-06-15 DIAGNOSIS — G309 Alzheimer's disease, unspecified: Secondary | ICD-10-CM | POA: Diagnosis not present

## 2021-06-15 DIAGNOSIS — E1151 Type 2 diabetes mellitus with diabetic peripheral angiopathy without gangrene: Secondary | ICD-10-CM | POA: Diagnosis not present

## 2021-06-24 DIAGNOSIS — K219 Gastro-esophageal reflux disease without esophagitis: Secondary | ICD-10-CM | POA: Diagnosis not present

## 2021-06-24 DIAGNOSIS — L89892 Pressure ulcer of other site, stage 2: Secondary | ICD-10-CM | POA: Diagnosis not present

## 2021-06-24 DIAGNOSIS — R634 Abnormal weight loss: Secondary | ICD-10-CM | POA: Diagnosis not present

## 2021-06-24 DIAGNOSIS — R0601 Orthopnea: Secondary | ICD-10-CM | POA: Diagnosis not present

## 2021-06-24 DIAGNOSIS — F0283 Dementia in other diseases classified elsewhere, unspecified severity, with mood disturbance: Secondary | ICD-10-CM | POA: Diagnosis not present

## 2021-06-24 DIAGNOSIS — E1122 Type 2 diabetes mellitus with diabetic chronic kidney disease: Secondary | ICD-10-CM | POA: Diagnosis not present

## 2021-06-24 DIAGNOSIS — E43 Unspecified severe protein-calorie malnutrition: Secondary | ICD-10-CM | POA: Diagnosis not present

## 2021-06-24 DIAGNOSIS — R32 Unspecified urinary incontinence: Secondary | ICD-10-CM | POA: Diagnosis not present

## 2021-06-24 DIAGNOSIS — J449 Chronic obstructive pulmonary disease, unspecified: Secondary | ICD-10-CM | POA: Diagnosis not present

## 2021-06-24 DIAGNOSIS — L89151 Pressure ulcer of sacral region, stage 1: Secondary | ICD-10-CM | POA: Diagnosis not present

## 2021-06-24 DIAGNOSIS — F02811 Dementia in other diseases classified elsewhere, unspecified severity, with agitation: Secondary | ICD-10-CM | POA: Diagnosis not present

## 2021-06-24 DIAGNOSIS — L308 Other specified dermatitis: Secondary | ICD-10-CM | POA: Diagnosis not present

## 2021-06-24 DIAGNOSIS — G4733 Obstructive sleep apnea (adult) (pediatric): Secondary | ICD-10-CM | POA: Diagnosis not present

## 2021-06-24 DIAGNOSIS — F0153 Vascular dementia, unspecified severity, with mood disturbance: Secondary | ICD-10-CM | POA: Diagnosis not present

## 2021-06-24 DIAGNOSIS — D631 Anemia in chronic kidney disease: Secondary | ICD-10-CM | POA: Diagnosis not present

## 2021-06-24 DIAGNOSIS — N1832 Chronic kidney disease, stage 3b: Secondary | ICD-10-CM | POA: Diagnosis not present

## 2021-06-24 DIAGNOSIS — G309 Alzheimer's disease, unspecified: Secondary | ICD-10-CM | POA: Diagnosis not present

## 2021-06-24 DIAGNOSIS — F0154 Vascular dementia, unspecified severity, with anxiety: Secondary | ICD-10-CM | POA: Diagnosis not present

## 2021-06-24 DIAGNOSIS — E1151 Type 2 diabetes mellitus with diabetic peripheral angiopathy without gangrene: Secondary | ICD-10-CM | POA: Diagnosis not present

## 2021-06-24 DIAGNOSIS — F01511 Vascular dementia, unspecified severity, with agitation: Secondary | ICD-10-CM | POA: Diagnosis not present

## 2021-06-24 DIAGNOSIS — I129 Hypertensive chronic kidney disease with stage 1 through stage 4 chronic kidney disease, or unspecified chronic kidney disease: Secondary | ICD-10-CM | POA: Diagnosis not present

## 2021-06-24 DIAGNOSIS — E8809 Other disorders of plasma-protein metabolism, not elsewhere classified: Secondary | ICD-10-CM | POA: Diagnosis not present

## 2021-06-24 DIAGNOSIS — J9691 Respiratory failure, unspecified with hypoxia: Secondary | ICD-10-CM | POA: Diagnosis not present

## 2021-06-24 DIAGNOSIS — F0284 Dementia in other diseases classified elsewhere, unspecified severity, with anxiety: Secondary | ICD-10-CM | POA: Diagnosis not present

## 2021-06-24 DIAGNOSIS — I712 Thoracic aortic aneurysm, without rupture, unspecified: Secondary | ICD-10-CM | POA: Diagnosis not present

## 2021-06-25 DIAGNOSIS — J9691 Respiratory failure, unspecified with hypoxia: Secondary | ICD-10-CM | POA: Diagnosis not present

## 2021-06-25 DIAGNOSIS — F0284 Dementia in other diseases classified elsewhere, unspecified severity, with anxiety: Secondary | ICD-10-CM | POA: Diagnosis not present

## 2021-06-25 DIAGNOSIS — F0283 Dementia in other diseases classified elsewhere, unspecified severity, with mood disturbance: Secondary | ICD-10-CM | POA: Diagnosis not present

## 2021-06-25 DIAGNOSIS — J449 Chronic obstructive pulmonary disease, unspecified: Secondary | ICD-10-CM | POA: Diagnosis not present

## 2021-06-25 DIAGNOSIS — G309 Alzheimer's disease, unspecified: Secondary | ICD-10-CM | POA: Diagnosis not present

## 2021-06-25 DIAGNOSIS — F02811 Dementia in other diseases classified elsewhere, unspecified severity, with agitation: Secondary | ICD-10-CM | POA: Diagnosis not present

## 2021-06-30 ENCOUNTER — Other Ambulatory Visit: Payer: Self-pay | Admitting: Internal Medicine

## 2021-06-30 DIAGNOSIS — G309 Alzheimer's disease, unspecified: Secondary | ICD-10-CM | POA: Diagnosis not present

## 2021-06-30 DIAGNOSIS — F0284 Dementia in other diseases classified elsewhere, unspecified severity, with anxiety: Secondary | ICD-10-CM | POA: Diagnosis not present

## 2021-06-30 DIAGNOSIS — J9691 Respiratory failure, unspecified with hypoxia: Secondary | ICD-10-CM | POA: Diagnosis not present

## 2021-06-30 DIAGNOSIS — J449 Chronic obstructive pulmonary disease, unspecified: Secondary | ICD-10-CM | POA: Diagnosis not present

## 2021-06-30 DIAGNOSIS — F02811 Dementia in other diseases classified elsewhere, unspecified severity, with agitation: Secondary | ICD-10-CM | POA: Diagnosis not present

## 2021-06-30 DIAGNOSIS — F0283 Dementia in other diseases classified elsewhere, unspecified severity, with mood disturbance: Secondary | ICD-10-CM | POA: Diagnosis not present

## 2021-07-02 DIAGNOSIS — F0284 Dementia in other diseases classified elsewhere, unspecified severity, with anxiety: Secondary | ICD-10-CM | POA: Diagnosis not present

## 2021-07-02 DIAGNOSIS — J449 Chronic obstructive pulmonary disease, unspecified: Secondary | ICD-10-CM | POA: Diagnosis not present

## 2021-07-02 DIAGNOSIS — F0283 Dementia in other diseases classified elsewhere, unspecified severity, with mood disturbance: Secondary | ICD-10-CM | POA: Diagnosis not present

## 2021-07-02 DIAGNOSIS — G309 Alzheimer's disease, unspecified: Secondary | ICD-10-CM | POA: Diagnosis not present

## 2021-07-02 DIAGNOSIS — J9691 Respiratory failure, unspecified with hypoxia: Secondary | ICD-10-CM | POA: Diagnosis not present

## 2021-07-02 DIAGNOSIS — F02811 Dementia in other diseases classified elsewhere, unspecified severity, with agitation: Secondary | ICD-10-CM | POA: Diagnosis not present

## 2021-07-09 DIAGNOSIS — J449 Chronic obstructive pulmonary disease, unspecified: Secondary | ICD-10-CM | POA: Diagnosis not present

## 2021-07-09 DIAGNOSIS — F02811 Dementia in other diseases classified elsewhere, unspecified severity, with agitation: Secondary | ICD-10-CM | POA: Diagnosis not present

## 2021-07-09 DIAGNOSIS — F0284 Dementia in other diseases classified elsewhere, unspecified severity, with anxiety: Secondary | ICD-10-CM | POA: Diagnosis not present

## 2021-07-09 DIAGNOSIS — J9691 Respiratory failure, unspecified with hypoxia: Secondary | ICD-10-CM | POA: Diagnosis not present

## 2021-07-09 DIAGNOSIS — G309 Alzheimer's disease, unspecified: Secondary | ICD-10-CM | POA: Diagnosis not present

## 2021-07-09 DIAGNOSIS — F0283 Dementia in other diseases classified elsewhere, unspecified severity, with mood disturbance: Secondary | ICD-10-CM | POA: Diagnosis not present

## 2021-07-14 DIAGNOSIS — F0283 Dementia in other diseases classified elsewhere, unspecified severity, with mood disturbance: Secondary | ICD-10-CM | POA: Diagnosis not present

## 2021-07-14 DIAGNOSIS — F02811 Dementia in other diseases classified elsewhere, unspecified severity, with agitation: Secondary | ICD-10-CM | POA: Diagnosis not present

## 2021-07-14 DIAGNOSIS — J9691 Respiratory failure, unspecified with hypoxia: Secondary | ICD-10-CM | POA: Diagnosis not present

## 2021-07-14 DIAGNOSIS — F0284 Dementia in other diseases classified elsewhere, unspecified severity, with anxiety: Secondary | ICD-10-CM | POA: Diagnosis not present

## 2021-07-14 DIAGNOSIS — G309 Alzheimer's disease, unspecified: Secondary | ICD-10-CM | POA: Diagnosis not present

## 2021-07-14 DIAGNOSIS — J449 Chronic obstructive pulmonary disease, unspecified: Secondary | ICD-10-CM | POA: Diagnosis not present

## 2021-07-16 DIAGNOSIS — F0284 Dementia in other diseases classified elsewhere, unspecified severity, with anxiety: Secondary | ICD-10-CM | POA: Diagnosis not present

## 2021-07-16 DIAGNOSIS — J449 Chronic obstructive pulmonary disease, unspecified: Secondary | ICD-10-CM | POA: Diagnosis not present

## 2021-07-16 DIAGNOSIS — F02811 Dementia in other diseases classified elsewhere, unspecified severity, with agitation: Secondary | ICD-10-CM | POA: Diagnosis not present

## 2021-07-16 DIAGNOSIS — G309 Alzheimer's disease, unspecified: Secondary | ICD-10-CM | POA: Diagnosis not present

## 2021-07-16 DIAGNOSIS — J9691 Respiratory failure, unspecified with hypoxia: Secondary | ICD-10-CM | POA: Diagnosis not present

## 2021-07-16 DIAGNOSIS — F0283 Dementia in other diseases classified elsewhere, unspecified severity, with mood disturbance: Secondary | ICD-10-CM | POA: Diagnosis not present

## 2021-07-22 DIAGNOSIS — F0284 Dementia in other diseases classified elsewhere, unspecified severity, with anxiety: Secondary | ICD-10-CM | POA: Diagnosis not present

## 2021-07-22 DIAGNOSIS — D631 Anemia in chronic kidney disease: Secondary | ICD-10-CM | POA: Diagnosis not present

## 2021-07-22 DIAGNOSIS — E1151 Type 2 diabetes mellitus with diabetic peripheral angiopathy without gangrene: Secondary | ICD-10-CM | POA: Diagnosis not present

## 2021-07-22 DIAGNOSIS — J449 Chronic obstructive pulmonary disease, unspecified: Secondary | ICD-10-CM | POA: Diagnosis not present

## 2021-07-22 DIAGNOSIS — R0601 Orthopnea: Secondary | ICD-10-CM | POA: Diagnosis not present

## 2021-07-22 DIAGNOSIS — G4733 Obstructive sleep apnea (adult) (pediatric): Secondary | ICD-10-CM | POA: Diagnosis not present

## 2021-07-22 DIAGNOSIS — J9691 Respiratory failure, unspecified with hypoxia: Secondary | ICD-10-CM | POA: Diagnosis not present

## 2021-07-22 DIAGNOSIS — N1832 Chronic kidney disease, stage 3b: Secondary | ICD-10-CM | POA: Diagnosis not present

## 2021-07-22 DIAGNOSIS — K219 Gastro-esophageal reflux disease without esophagitis: Secondary | ICD-10-CM | POA: Diagnosis not present

## 2021-07-22 DIAGNOSIS — E43 Unspecified severe protein-calorie malnutrition: Secondary | ICD-10-CM | POA: Diagnosis not present

## 2021-07-22 DIAGNOSIS — E1122 Type 2 diabetes mellitus with diabetic chronic kidney disease: Secondary | ICD-10-CM | POA: Diagnosis not present

## 2021-07-22 DIAGNOSIS — L308 Other specified dermatitis: Secondary | ICD-10-CM | POA: Diagnosis not present

## 2021-07-22 DIAGNOSIS — F0153 Vascular dementia, unspecified severity, with mood disturbance: Secondary | ICD-10-CM | POA: Diagnosis not present

## 2021-07-22 DIAGNOSIS — L89892 Pressure ulcer of other site, stage 2: Secondary | ICD-10-CM | POA: Diagnosis not present

## 2021-07-22 DIAGNOSIS — F02811 Dementia in other diseases classified elsewhere, unspecified severity, with agitation: Secondary | ICD-10-CM | POA: Diagnosis not present

## 2021-07-22 DIAGNOSIS — F0154 Vascular dementia, unspecified severity, with anxiety: Secondary | ICD-10-CM | POA: Diagnosis not present

## 2021-07-22 DIAGNOSIS — E8809 Other disorders of plasma-protein metabolism, not elsewhere classified: Secondary | ICD-10-CM | POA: Diagnosis not present

## 2021-07-22 DIAGNOSIS — L89151 Pressure ulcer of sacral region, stage 1: Secondary | ICD-10-CM | POA: Diagnosis not present

## 2021-07-22 DIAGNOSIS — F01511 Vascular dementia, unspecified severity, with agitation: Secondary | ICD-10-CM | POA: Diagnosis not present

## 2021-07-22 DIAGNOSIS — I712 Thoracic aortic aneurysm, without rupture, unspecified: Secondary | ICD-10-CM | POA: Diagnosis not present

## 2021-07-22 DIAGNOSIS — I129 Hypertensive chronic kidney disease with stage 1 through stage 4 chronic kidney disease, or unspecified chronic kidney disease: Secondary | ICD-10-CM | POA: Diagnosis not present

## 2021-07-22 DIAGNOSIS — R32 Unspecified urinary incontinence: Secondary | ICD-10-CM | POA: Diagnosis not present

## 2021-07-22 DIAGNOSIS — R634 Abnormal weight loss: Secondary | ICD-10-CM | POA: Diagnosis not present

## 2021-07-22 DIAGNOSIS — G309 Alzheimer's disease, unspecified: Secondary | ICD-10-CM | POA: Diagnosis not present

## 2021-07-22 DIAGNOSIS — F0283 Dementia in other diseases classified elsewhere, unspecified severity, with mood disturbance: Secondary | ICD-10-CM | POA: Diagnosis not present

## 2021-07-23 DIAGNOSIS — J9691 Respiratory failure, unspecified with hypoxia: Secondary | ICD-10-CM | POA: Diagnosis not present

## 2021-07-23 DIAGNOSIS — F0284 Dementia in other diseases classified elsewhere, unspecified severity, with anxiety: Secondary | ICD-10-CM | POA: Diagnosis not present

## 2021-07-23 DIAGNOSIS — F02811 Dementia in other diseases classified elsewhere, unspecified severity, with agitation: Secondary | ICD-10-CM | POA: Diagnosis not present

## 2021-07-23 DIAGNOSIS — F0283 Dementia in other diseases classified elsewhere, unspecified severity, with mood disturbance: Secondary | ICD-10-CM | POA: Diagnosis not present

## 2021-07-23 DIAGNOSIS — J449 Chronic obstructive pulmonary disease, unspecified: Secondary | ICD-10-CM | POA: Diagnosis not present

## 2021-07-23 DIAGNOSIS — G309 Alzheimer's disease, unspecified: Secondary | ICD-10-CM | POA: Diagnosis not present

## 2021-07-27 DIAGNOSIS — J9691 Respiratory failure, unspecified with hypoxia: Secondary | ICD-10-CM | POA: Diagnosis not present

## 2021-07-27 DIAGNOSIS — J449 Chronic obstructive pulmonary disease, unspecified: Secondary | ICD-10-CM | POA: Diagnosis not present

## 2021-07-27 DIAGNOSIS — F0283 Dementia in other diseases classified elsewhere, unspecified severity, with mood disturbance: Secondary | ICD-10-CM | POA: Diagnosis not present

## 2021-07-27 DIAGNOSIS — F02811 Dementia in other diseases classified elsewhere, unspecified severity, with agitation: Secondary | ICD-10-CM | POA: Diagnosis not present

## 2021-07-27 DIAGNOSIS — G309 Alzheimer's disease, unspecified: Secondary | ICD-10-CM | POA: Diagnosis not present

## 2021-07-27 DIAGNOSIS — F0284 Dementia in other diseases classified elsewhere, unspecified severity, with anxiety: Secondary | ICD-10-CM | POA: Diagnosis not present

## 2021-07-30 DIAGNOSIS — G309 Alzheimer's disease, unspecified: Secondary | ICD-10-CM | POA: Diagnosis not present

## 2021-07-30 DIAGNOSIS — F0284 Dementia in other diseases classified elsewhere, unspecified severity, with anxiety: Secondary | ICD-10-CM | POA: Diagnosis not present

## 2021-07-30 DIAGNOSIS — F0283 Dementia in other diseases classified elsewhere, unspecified severity, with mood disturbance: Secondary | ICD-10-CM | POA: Diagnosis not present

## 2021-07-30 DIAGNOSIS — J449 Chronic obstructive pulmonary disease, unspecified: Secondary | ICD-10-CM | POA: Diagnosis not present

## 2021-07-30 DIAGNOSIS — F02811 Dementia in other diseases classified elsewhere, unspecified severity, with agitation: Secondary | ICD-10-CM | POA: Diagnosis not present

## 2021-07-30 DIAGNOSIS — J9691 Respiratory failure, unspecified with hypoxia: Secondary | ICD-10-CM | POA: Diagnosis not present

## 2021-08-06 DIAGNOSIS — J9691 Respiratory failure, unspecified with hypoxia: Secondary | ICD-10-CM | POA: Diagnosis not present

## 2021-08-06 DIAGNOSIS — F0283 Dementia in other diseases classified elsewhere, unspecified severity, with mood disturbance: Secondary | ICD-10-CM | POA: Diagnosis not present

## 2021-08-06 DIAGNOSIS — F02811 Dementia in other diseases classified elsewhere, unspecified severity, with agitation: Secondary | ICD-10-CM | POA: Diagnosis not present

## 2021-08-06 DIAGNOSIS — F0284 Dementia in other diseases classified elsewhere, unspecified severity, with anxiety: Secondary | ICD-10-CM | POA: Diagnosis not present

## 2021-08-06 DIAGNOSIS — G309 Alzheimer's disease, unspecified: Secondary | ICD-10-CM | POA: Diagnosis not present

## 2021-08-06 DIAGNOSIS — J449 Chronic obstructive pulmonary disease, unspecified: Secondary | ICD-10-CM | POA: Diagnosis not present

## 2021-08-11 DIAGNOSIS — J9691 Respiratory failure, unspecified with hypoxia: Secondary | ICD-10-CM | POA: Diagnosis not present

## 2021-08-11 DIAGNOSIS — F02811 Dementia in other diseases classified elsewhere, unspecified severity, with agitation: Secondary | ICD-10-CM | POA: Diagnosis not present

## 2021-08-11 DIAGNOSIS — F0283 Dementia in other diseases classified elsewhere, unspecified severity, with mood disturbance: Secondary | ICD-10-CM | POA: Diagnosis not present

## 2021-08-11 DIAGNOSIS — G309 Alzheimer's disease, unspecified: Secondary | ICD-10-CM | POA: Diagnosis not present

## 2021-08-11 DIAGNOSIS — F0284 Dementia in other diseases classified elsewhere, unspecified severity, with anxiety: Secondary | ICD-10-CM | POA: Diagnosis not present

## 2021-08-11 DIAGNOSIS — J449 Chronic obstructive pulmonary disease, unspecified: Secondary | ICD-10-CM | POA: Diagnosis not present

## 2021-08-13 DIAGNOSIS — F02811 Dementia in other diseases classified elsewhere, unspecified severity, with agitation: Secondary | ICD-10-CM | POA: Diagnosis not present

## 2021-08-13 DIAGNOSIS — F0283 Dementia in other diseases classified elsewhere, unspecified severity, with mood disturbance: Secondary | ICD-10-CM | POA: Diagnosis not present

## 2021-08-13 DIAGNOSIS — J9691 Respiratory failure, unspecified with hypoxia: Secondary | ICD-10-CM | POA: Diagnosis not present

## 2021-08-13 DIAGNOSIS — G309 Alzheimer's disease, unspecified: Secondary | ICD-10-CM | POA: Diagnosis not present

## 2021-08-13 DIAGNOSIS — J449 Chronic obstructive pulmonary disease, unspecified: Secondary | ICD-10-CM | POA: Diagnosis not present

## 2021-08-13 DIAGNOSIS — F0284 Dementia in other diseases classified elsewhere, unspecified severity, with anxiety: Secondary | ICD-10-CM | POA: Diagnosis not present

## 2021-08-20 DIAGNOSIS — J449 Chronic obstructive pulmonary disease, unspecified: Secondary | ICD-10-CM | POA: Diagnosis not present

## 2021-08-20 DIAGNOSIS — G309 Alzheimer's disease, unspecified: Secondary | ICD-10-CM | POA: Diagnosis not present

## 2021-08-20 DIAGNOSIS — F02811 Dementia in other diseases classified elsewhere, unspecified severity, with agitation: Secondary | ICD-10-CM | POA: Diagnosis not present

## 2021-08-20 DIAGNOSIS — F0284 Dementia in other diseases classified elsewhere, unspecified severity, with anxiety: Secondary | ICD-10-CM | POA: Diagnosis not present

## 2021-08-20 DIAGNOSIS — J9691 Respiratory failure, unspecified with hypoxia: Secondary | ICD-10-CM | POA: Diagnosis not present

## 2021-08-20 DIAGNOSIS — F0283 Dementia in other diseases classified elsewhere, unspecified severity, with mood disturbance: Secondary | ICD-10-CM | POA: Diagnosis not present

## 2021-08-22 DIAGNOSIS — F0284 Dementia in other diseases classified elsewhere, unspecified severity, with anxiety: Secondary | ICD-10-CM | POA: Diagnosis not present

## 2021-08-22 DIAGNOSIS — I129 Hypertensive chronic kidney disease with stage 1 through stage 4 chronic kidney disease, or unspecified chronic kidney disease: Secondary | ICD-10-CM | POA: Diagnosis not present

## 2021-08-22 DIAGNOSIS — E1151 Type 2 diabetes mellitus with diabetic peripheral angiopathy without gangrene: Secondary | ICD-10-CM | POA: Diagnosis not present

## 2021-08-22 DIAGNOSIS — N1832 Chronic kidney disease, stage 3b: Secondary | ICD-10-CM | POA: Diagnosis not present

## 2021-08-22 DIAGNOSIS — R634 Abnormal weight loss: Secondary | ICD-10-CM | POA: Diagnosis not present

## 2021-08-22 DIAGNOSIS — J9691 Respiratory failure, unspecified with hypoxia: Secondary | ICD-10-CM | POA: Diagnosis not present

## 2021-08-22 DIAGNOSIS — L308 Other specified dermatitis: Secondary | ICD-10-CM | POA: Diagnosis not present

## 2021-08-22 DIAGNOSIS — G309 Alzheimer's disease, unspecified: Secondary | ICD-10-CM | POA: Diagnosis not present

## 2021-08-22 DIAGNOSIS — E8809 Other disorders of plasma-protein metabolism, not elsewhere classified: Secondary | ICD-10-CM | POA: Diagnosis not present

## 2021-08-22 DIAGNOSIS — J449 Chronic obstructive pulmonary disease, unspecified: Secondary | ICD-10-CM | POA: Diagnosis not present

## 2021-08-22 DIAGNOSIS — F0153 Vascular dementia, unspecified severity, with mood disturbance: Secondary | ICD-10-CM | POA: Diagnosis not present

## 2021-08-22 DIAGNOSIS — F0283 Dementia in other diseases classified elsewhere, unspecified severity, with mood disturbance: Secondary | ICD-10-CM | POA: Diagnosis not present

## 2021-08-22 DIAGNOSIS — F02811 Dementia in other diseases classified elsewhere, unspecified severity, with agitation: Secondary | ICD-10-CM | POA: Diagnosis not present

## 2021-08-22 DIAGNOSIS — R0601 Orthopnea: Secondary | ICD-10-CM | POA: Diagnosis not present

## 2021-08-22 DIAGNOSIS — K219 Gastro-esophageal reflux disease without esophagitis: Secondary | ICD-10-CM | POA: Diagnosis not present

## 2021-08-22 DIAGNOSIS — D631 Anemia in chronic kidney disease: Secondary | ICD-10-CM | POA: Diagnosis not present

## 2021-08-22 DIAGNOSIS — E1122 Type 2 diabetes mellitus with diabetic chronic kidney disease: Secondary | ICD-10-CM | POA: Diagnosis not present

## 2021-08-22 DIAGNOSIS — F01511 Vascular dementia, unspecified severity, with agitation: Secondary | ICD-10-CM | POA: Diagnosis not present

## 2021-08-22 DIAGNOSIS — G4733 Obstructive sleep apnea (adult) (pediatric): Secondary | ICD-10-CM | POA: Diagnosis not present

## 2021-08-22 DIAGNOSIS — L89892 Pressure ulcer of other site, stage 2: Secondary | ICD-10-CM | POA: Diagnosis not present

## 2021-08-22 DIAGNOSIS — E43 Unspecified severe protein-calorie malnutrition: Secondary | ICD-10-CM | POA: Diagnosis not present

## 2021-08-22 DIAGNOSIS — I712 Thoracic aortic aneurysm, without rupture, unspecified: Secondary | ICD-10-CM | POA: Diagnosis not present

## 2021-08-22 DIAGNOSIS — F0154 Vascular dementia, unspecified severity, with anxiety: Secondary | ICD-10-CM | POA: Diagnosis not present

## 2021-08-22 DIAGNOSIS — L89151 Pressure ulcer of sacral region, stage 1: Secondary | ICD-10-CM | POA: Diagnosis not present

## 2021-08-22 DIAGNOSIS — R32 Unspecified urinary incontinence: Secondary | ICD-10-CM | POA: Diagnosis not present

## 2021-08-25 DIAGNOSIS — J449 Chronic obstructive pulmonary disease, unspecified: Secondary | ICD-10-CM | POA: Diagnosis not present

## 2021-08-25 DIAGNOSIS — F0284 Dementia in other diseases classified elsewhere, unspecified severity, with anxiety: Secondary | ICD-10-CM | POA: Diagnosis not present

## 2021-08-25 DIAGNOSIS — G309 Alzheimer's disease, unspecified: Secondary | ICD-10-CM | POA: Diagnosis not present

## 2021-08-25 DIAGNOSIS — F0283 Dementia in other diseases classified elsewhere, unspecified severity, with mood disturbance: Secondary | ICD-10-CM | POA: Diagnosis not present

## 2021-08-25 DIAGNOSIS — J9691 Respiratory failure, unspecified with hypoxia: Secondary | ICD-10-CM | POA: Diagnosis not present

## 2021-08-25 DIAGNOSIS — F02811 Dementia in other diseases classified elsewhere, unspecified severity, with agitation: Secondary | ICD-10-CM | POA: Diagnosis not present

## 2021-08-27 DIAGNOSIS — F02811 Dementia in other diseases classified elsewhere, unspecified severity, with agitation: Secondary | ICD-10-CM | POA: Diagnosis not present

## 2021-08-27 DIAGNOSIS — G309 Alzheimer's disease, unspecified: Secondary | ICD-10-CM | POA: Diagnosis not present

## 2021-08-27 DIAGNOSIS — F0283 Dementia in other diseases classified elsewhere, unspecified severity, with mood disturbance: Secondary | ICD-10-CM | POA: Diagnosis not present

## 2021-08-27 DIAGNOSIS — J449 Chronic obstructive pulmonary disease, unspecified: Secondary | ICD-10-CM | POA: Diagnosis not present

## 2021-08-27 DIAGNOSIS — J9691 Respiratory failure, unspecified with hypoxia: Secondary | ICD-10-CM | POA: Diagnosis not present

## 2021-08-27 DIAGNOSIS — F0284 Dementia in other diseases classified elsewhere, unspecified severity, with anxiety: Secondary | ICD-10-CM | POA: Diagnosis not present

## 2021-09-03 DIAGNOSIS — F02811 Dementia in other diseases classified elsewhere, unspecified severity, with agitation: Secondary | ICD-10-CM | POA: Diagnosis not present

## 2021-09-03 DIAGNOSIS — G309 Alzheimer's disease, unspecified: Secondary | ICD-10-CM | POA: Diagnosis not present

## 2021-09-03 DIAGNOSIS — F0283 Dementia in other diseases classified elsewhere, unspecified severity, with mood disturbance: Secondary | ICD-10-CM | POA: Diagnosis not present

## 2021-09-03 DIAGNOSIS — J9691 Respiratory failure, unspecified with hypoxia: Secondary | ICD-10-CM | POA: Diagnosis not present

## 2021-09-03 DIAGNOSIS — F0284 Dementia in other diseases classified elsewhere, unspecified severity, with anxiety: Secondary | ICD-10-CM | POA: Diagnosis not present

## 2021-09-03 DIAGNOSIS — J449 Chronic obstructive pulmonary disease, unspecified: Secondary | ICD-10-CM | POA: Diagnosis not present

## 2021-09-10 DIAGNOSIS — F0283 Dementia in other diseases classified elsewhere, unspecified severity, with mood disturbance: Secondary | ICD-10-CM | POA: Diagnosis not present

## 2021-09-10 DIAGNOSIS — J9691 Respiratory failure, unspecified with hypoxia: Secondary | ICD-10-CM | POA: Diagnosis not present

## 2021-09-10 DIAGNOSIS — F0284 Dementia in other diseases classified elsewhere, unspecified severity, with anxiety: Secondary | ICD-10-CM | POA: Diagnosis not present

## 2021-09-10 DIAGNOSIS — F02811 Dementia in other diseases classified elsewhere, unspecified severity, with agitation: Secondary | ICD-10-CM | POA: Diagnosis not present

## 2021-09-10 DIAGNOSIS — G309 Alzheimer's disease, unspecified: Secondary | ICD-10-CM | POA: Diagnosis not present

## 2021-09-10 DIAGNOSIS — J449 Chronic obstructive pulmonary disease, unspecified: Secondary | ICD-10-CM | POA: Diagnosis not present

## 2021-09-14 DIAGNOSIS — G309 Alzheimer's disease, unspecified: Secondary | ICD-10-CM | POA: Diagnosis not present

## 2021-09-14 DIAGNOSIS — F0284 Dementia in other diseases classified elsewhere, unspecified severity, with anxiety: Secondary | ICD-10-CM | POA: Diagnosis not present

## 2021-09-14 DIAGNOSIS — F0283 Dementia in other diseases classified elsewhere, unspecified severity, with mood disturbance: Secondary | ICD-10-CM | POA: Diagnosis not present

## 2021-09-14 DIAGNOSIS — J9691 Respiratory failure, unspecified with hypoxia: Secondary | ICD-10-CM | POA: Diagnosis not present

## 2021-09-14 DIAGNOSIS — F02811 Dementia in other diseases classified elsewhere, unspecified severity, with agitation: Secondary | ICD-10-CM | POA: Diagnosis not present

## 2021-09-14 DIAGNOSIS — J449 Chronic obstructive pulmonary disease, unspecified: Secondary | ICD-10-CM | POA: Diagnosis not present

## 2021-09-17 DIAGNOSIS — G309 Alzheimer's disease, unspecified: Secondary | ICD-10-CM | POA: Diagnosis not present

## 2021-09-17 DIAGNOSIS — F02811 Dementia in other diseases classified elsewhere, unspecified severity, with agitation: Secondary | ICD-10-CM | POA: Diagnosis not present

## 2021-09-17 DIAGNOSIS — F0283 Dementia in other diseases classified elsewhere, unspecified severity, with mood disturbance: Secondary | ICD-10-CM | POA: Diagnosis not present

## 2021-09-17 DIAGNOSIS — F0284 Dementia in other diseases classified elsewhere, unspecified severity, with anxiety: Secondary | ICD-10-CM | POA: Diagnosis not present

## 2021-09-17 DIAGNOSIS — J9691 Respiratory failure, unspecified with hypoxia: Secondary | ICD-10-CM | POA: Diagnosis not present

## 2021-09-17 DIAGNOSIS — J449 Chronic obstructive pulmonary disease, unspecified: Secondary | ICD-10-CM | POA: Diagnosis not present

## 2021-09-21 DIAGNOSIS — E1122 Type 2 diabetes mellitus with diabetic chronic kidney disease: Secondary | ICD-10-CM | POA: Diagnosis not present

## 2021-09-21 DIAGNOSIS — J449 Chronic obstructive pulmonary disease, unspecified: Secondary | ICD-10-CM | POA: Diagnosis not present

## 2021-09-21 DIAGNOSIS — F0284 Dementia in other diseases classified elsewhere, unspecified severity, with anxiety: Secondary | ICD-10-CM | POA: Diagnosis not present

## 2021-09-21 DIAGNOSIS — K219 Gastro-esophageal reflux disease without esophagitis: Secondary | ICD-10-CM | POA: Diagnosis not present

## 2021-09-21 DIAGNOSIS — E43 Unspecified severe protein-calorie malnutrition: Secondary | ICD-10-CM | POA: Diagnosis not present

## 2021-09-21 DIAGNOSIS — I712 Thoracic aortic aneurysm, without rupture, unspecified: Secondary | ICD-10-CM | POA: Diagnosis not present

## 2021-09-21 DIAGNOSIS — D631 Anemia in chronic kidney disease: Secondary | ICD-10-CM | POA: Diagnosis not present

## 2021-09-21 DIAGNOSIS — E8809 Other disorders of plasma-protein metabolism, not elsewhere classified: Secondary | ICD-10-CM | POA: Diagnosis not present

## 2021-09-21 DIAGNOSIS — J9691 Respiratory failure, unspecified with hypoxia: Secondary | ICD-10-CM | POA: Diagnosis not present

## 2021-09-21 DIAGNOSIS — I129 Hypertensive chronic kidney disease with stage 1 through stage 4 chronic kidney disease, or unspecified chronic kidney disease: Secondary | ICD-10-CM | POA: Diagnosis not present

## 2021-09-21 DIAGNOSIS — F01511 Vascular dementia, unspecified severity, with agitation: Secondary | ICD-10-CM | POA: Diagnosis not present

## 2021-09-21 DIAGNOSIS — L89892 Pressure ulcer of other site, stage 2: Secondary | ICD-10-CM | POA: Diagnosis not present

## 2021-09-21 DIAGNOSIS — R634 Abnormal weight loss: Secondary | ICD-10-CM | POA: Diagnosis not present

## 2021-09-21 DIAGNOSIS — F0283 Dementia in other diseases classified elsewhere, unspecified severity, with mood disturbance: Secondary | ICD-10-CM | POA: Diagnosis not present

## 2021-09-21 DIAGNOSIS — L308 Other specified dermatitis: Secondary | ICD-10-CM | POA: Diagnosis not present

## 2021-09-21 DIAGNOSIS — N1832 Chronic kidney disease, stage 3b: Secondary | ICD-10-CM | POA: Diagnosis not present

## 2021-09-21 DIAGNOSIS — F0154 Vascular dementia, unspecified severity, with anxiety: Secondary | ICD-10-CM | POA: Diagnosis not present

## 2021-09-21 DIAGNOSIS — L89151 Pressure ulcer of sacral region, stage 1: Secondary | ICD-10-CM | POA: Diagnosis not present

## 2021-09-21 DIAGNOSIS — E1151 Type 2 diabetes mellitus with diabetic peripheral angiopathy without gangrene: Secondary | ICD-10-CM | POA: Diagnosis not present

## 2021-09-21 DIAGNOSIS — G4733 Obstructive sleep apnea (adult) (pediatric): Secondary | ICD-10-CM | POA: Diagnosis not present

## 2021-09-21 DIAGNOSIS — F0153 Vascular dementia, unspecified severity, with mood disturbance: Secondary | ICD-10-CM | POA: Diagnosis not present

## 2021-09-21 DIAGNOSIS — F02811 Dementia in other diseases classified elsewhere, unspecified severity, with agitation: Secondary | ICD-10-CM | POA: Diagnosis not present

## 2021-09-21 DIAGNOSIS — R32 Unspecified urinary incontinence: Secondary | ICD-10-CM | POA: Diagnosis not present

## 2021-09-21 DIAGNOSIS — R0601 Orthopnea: Secondary | ICD-10-CM | POA: Diagnosis not present

## 2021-09-21 DIAGNOSIS — G309 Alzheimer's disease, unspecified: Secondary | ICD-10-CM | POA: Diagnosis not present

## 2021-09-22 DIAGNOSIS — G309 Alzheimer's disease, unspecified: Secondary | ICD-10-CM | POA: Diagnosis not present

## 2021-09-22 DIAGNOSIS — F0283 Dementia in other diseases classified elsewhere, unspecified severity, with mood disturbance: Secondary | ICD-10-CM | POA: Diagnosis not present

## 2021-09-22 DIAGNOSIS — F02811 Dementia in other diseases classified elsewhere, unspecified severity, with agitation: Secondary | ICD-10-CM | POA: Diagnosis not present

## 2021-09-22 DIAGNOSIS — F0284 Dementia in other diseases classified elsewhere, unspecified severity, with anxiety: Secondary | ICD-10-CM | POA: Diagnosis not present

## 2021-09-22 DIAGNOSIS — J449 Chronic obstructive pulmonary disease, unspecified: Secondary | ICD-10-CM | POA: Diagnosis not present

## 2021-09-22 DIAGNOSIS — J9691 Respiratory failure, unspecified with hypoxia: Secondary | ICD-10-CM | POA: Diagnosis not present

## 2021-09-23 DIAGNOSIS — J449 Chronic obstructive pulmonary disease, unspecified: Secondary | ICD-10-CM | POA: Diagnosis not present

## 2021-09-23 DIAGNOSIS — F0283 Dementia in other diseases classified elsewhere, unspecified severity, with mood disturbance: Secondary | ICD-10-CM | POA: Diagnosis not present

## 2021-09-23 DIAGNOSIS — G309 Alzheimer's disease, unspecified: Secondary | ICD-10-CM | POA: Diagnosis not present

## 2021-09-23 DIAGNOSIS — J9691 Respiratory failure, unspecified with hypoxia: Secondary | ICD-10-CM | POA: Diagnosis not present

## 2021-09-23 DIAGNOSIS — F0284 Dementia in other diseases classified elsewhere, unspecified severity, with anxiety: Secondary | ICD-10-CM | POA: Diagnosis not present

## 2021-09-23 DIAGNOSIS — F02811 Dementia in other diseases classified elsewhere, unspecified severity, with agitation: Secondary | ICD-10-CM | POA: Diagnosis not present

## 2021-09-26 DIAGNOSIS — F0284 Dementia in other diseases classified elsewhere, unspecified severity, with anxiety: Secondary | ICD-10-CM | POA: Diagnosis not present

## 2021-09-26 DIAGNOSIS — F0283 Dementia in other diseases classified elsewhere, unspecified severity, with mood disturbance: Secondary | ICD-10-CM | POA: Diagnosis not present

## 2021-09-26 DIAGNOSIS — F02811 Dementia in other diseases classified elsewhere, unspecified severity, with agitation: Secondary | ICD-10-CM | POA: Diagnosis not present

## 2021-09-26 DIAGNOSIS — G309 Alzheimer's disease, unspecified: Secondary | ICD-10-CM | POA: Diagnosis not present

## 2021-09-26 DIAGNOSIS — J9691 Respiratory failure, unspecified with hypoxia: Secondary | ICD-10-CM | POA: Diagnosis not present

## 2021-09-26 DIAGNOSIS — J449 Chronic obstructive pulmonary disease, unspecified: Secondary | ICD-10-CM | POA: Diagnosis not present

## 2021-09-27 DIAGNOSIS — F0284 Dementia in other diseases classified elsewhere, unspecified severity, with anxiety: Secondary | ICD-10-CM | POA: Diagnosis not present

## 2021-09-27 DIAGNOSIS — J9691 Respiratory failure, unspecified with hypoxia: Secondary | ICD-10-CM | POA: Diagnosis not present

## 2021-09-27 DIAGNOSIS — F0283 Dementia in other diseases classified elsewhere, unspecified severity, with mood disturbance: Secondary | ICD-10-CM | POA: Diagnosis not present

## 2021-09-27 DIAGNOSIS — F02811 Dementia in other diseases classified elsewhere, unspecified severity, with agitation: Secondary | ICD-10-CM | POA: Diagnosis not present

## 2021-09-27 DIAGNOSIS — G309 Alzheimer's disease, unspecified: Secondary | ICD-10-CM | POA: Diagnosis not present

## 2021-09-27 DIAGNOSIS — J449 Chronic obstructive pulmonary disease, unspecified: Secondary | ICD-10-CM | POA: Diagnosis not present

## 2021-09-28 DIAGNOSIS — F0283 Dementia in other diseases classified elsewhere, unspecified severity, with mood disturbance: Secondary | ICD-10-CM | POA: Diagnosis not present

## 2021-09-28 DIAGNOSIS — G309 Alzheimer's disease, unspecified: Secondary | ICD-10-CM | POA: Diagnosis not present

## 2021-09-28 DIAGNOSIS — F0284 Dementia in other diseases classified elsewhere, unspecified severity, with anxiety: Secondary | ICD-10-CM | POA: Diagnosis not present

## 2021-09-28 DIAGNOSIS — F02811 Dementia in other diseases classified elsewhere, unspecified severity, with agitation: Secondary | ICD-10-CM | POA: Diagnosis not present

## 2021-09-28 DIAGNOSIS — J9691 Respiratory failure, unspecified with hypoxia: Secondary | ICD-10-CM | POA: Diagnosis not present

## 2021-09-28 DIAGNOSIS — J449 Chronic obstructive pulmonary disease, unspecified: Secondary | ICD-10-CM | POA: Diagnosis not present

## 2021-09-29 DIAGNOSIS — F0283 Dementia in other diseases classified elsewhere, unspecified severity, with mood disturbance: Secondary | ICD-10-CM | POA: Diagnosis not present

## 2021-09-29 DIAGNOSIS — F02811 Dementia in other diseases classified elsewhere, unspecified severity, with agitation: Secondary | ICD-10-CM | POA: Diagnosis not present

## 2021-09-29 DIAGNOSIS — J9691 Respiratory failure, unspecified with hypoxia: Secondary | ICD-10-CM | POA: Diagnosis not present

## 2021-09-29 DIAGNOSIS — G309 Alzheimer's disease, unspecified: Secondary | ICD-10-CM | POA: Diagnosis not present

## 2021-09-29 DIAGNOSIS — F0284 Dementia in other diseases classified elsewhere, unspecified severity, with anxiety: Secondary | ICD-10-CM | POA: Diagnosis not present

## 2021-09-29 DIAGNOSIS — J449 Chronic obstructive pulmonary disease, unspecified: Secondary | ICD-10-CM | POA: Diagnosis not present

## 2021-10-02 ENCOUNTER — Other Ambulatory Visit: Payer: Self-pay | Admitting: Internal Medicine

## 2021-10-05 ENCOUNTER — Other Ambulatory Visit: Payer: Self-pay

## 2021-10-05 ENCOUNTER — Telehealth: Payer: Self-pay | Admitting: Internal Medicine

## 2021-10-05 MED ORDER — GENTAMICIN SULFATE 0.3 % OP SOLN: 1.0000 [drp] | Freq: Four times a day (QID) | OPHTHALMIC | 0 refills | Status: AC

## 2021-10-05 NOTE — Telephone Encounter (Signed)
Clarene Reamer from [facility] called, [phone]. Threasa Beards saw patient today and she says patient has conjunctivitis in both eyes she is requesting eye drops for patient. ?

## 2021-10-05 NOTE — Telephone Encounter (Signed)
S/w pt daughter [name] ,  confirmed should not be allergic to Gentamycin. ?S/w Melanie at [facility], asked for [place] to send in rx to [facility] ? ?Rx sent - Gentamicin opt. solu. 1 drop on each eye every 6 hours ?

## 2021-10-05 NOTE — Telephone Encounter (Signed)
Confirm no allergies to gentamicin.  If no, then can send in gentamicin eye drops - 1 drop in each eye q 6 hours.  I can send in if needed or can give a verbal order to them.  ?

## 2021-10-06 DIAGNOSIS — F0284 Dementia in other diseases classified elsewhere, unspecified severity, with anxiety: Secondary | ICD-10-CM | POA: Diagnosis not present

## 2021-10-06 DIAGNOSIS — J449 Chronic obstructive pulmonary disease, unspecified: Secondary | ICD-10-CM | POA: Diagnosis not present

## 2021-10-06 DIAGNOSIS — F0283 Dementia in other diseases classified elsewhere, unspecified severity, with mood disturbance: Secondary | ICD-10-CM | POA: Diagnosis not present

## 2021-10-06 DIAGNOSIS — J9691 Respiratory failure, unspecified with hypoxia: Secondary | ICD-10-CM | POA: Diagnosis not present

## 2021-10-06 DIAGNOSIS — F02811 Dementia in other diseases classified elsewhere, unspecified severity, with agitation: Secondary | ICD-10-CM | POA: Diagnosis not present

## 2021-10-06 DIAGNOSIS — G309 Alzheimer's disease, unspecified: Secondary | ICD-10-CM | POA: Diagnosis not present

## 2021-10-06 MED ORDER — AZITHROMYCIN 250 MG PO TABS: ORAL_TABLET | ORAL | 0 refills | Status: AC

## 2021-10-06 NOTE — Addendum Note (Signed)
Addended by: Charm Barges on: 10/06/2021 01:56 PM ? ? Modules accepted: Orders ? ?

## 2021-10-06 NOTE — Telephone Encounter (Signed)
Spoke to [facility].  Rx sent in for zpak.  Will keep me posted and let me know if feels needs anything more.   ?

## 2021-10-06 NOTE — Telephone Encounter (Signed)
Melody called from [facility] stating pt has labored breathing at 30 a min. Pt heart rate is 115 and her O2 stat drop from 80 then she was put on oxygen and it increased. Melody stated pt has respiratory infection and need antibiotic. Melody stated she gave pt tylenol and cough syrup ?[phone]  ? ?

## 2021-10-08 ENCOUNTER — Telehealth: Payer: Self-pay

## 2021-10-08 ENCOUNTER — Telehealth: Payer: Self-pay | Admitting: Internal Medicine

## 2021-10-08 DIAGNOSIS — J9691 Respiratory failure, unspecified with hypoxia: Secondary | ICD-10-CM | POA: Diagnosis not present

## 2021-10-08 DIAGNOSIS — F0283 Dementia in other diseases classified elsewhere, unspecified severity, with mood disturbance: Secondary | ICD-10-CM | POA: Diagnosis not present

## 2021-10-08 DIAGNOSIS — J449 Chronic obstructive pulmonary disease, unspecified: Secondary | ICD-10-CM | POA: Diagnosis not present

## 2021-10-08 DIAGNOSIS — G309 Alzheimer's disease, unspecified: Secondary | ICD-10-CM | POA: Diagnosis not present

## 2021-10-08 DIAGNOSIS — F02811 Dementia in other diseases classified elsewhere, unspecified severity, with agitation: Secondary | ICD-10-CM | POA: Diagnosis not present

## 2021-10-08 DIAGNOSIS — F0284 Dementia in other diseases classified elsewhere, unspecified severity, with anxiety: Secondary | ICD-10-CM | POA: Diagnosis not present

## 2021-10-08 MED ORDER — HYOSCYAMINE SULFATE 0.125 MG SL SUBL: 0.1250 mg | SUBLINGUAL_TABLET | SUBLINGUAL | 0 refills | Status: AC | PRN

## 2021-10-08 MED ORDER — PREDNISONE 10 MG PO TABS: ORAL_TABLET | ORAL | 0 refills | Status: AC

## 2021-10-08 MED ORDER — MORPHINE SULFATE (CONCENTRATE) 20 MG/ML PO SOLN: ORAL | 0 refills | Status: AC

## 2021-10-08 NOTE — Telephone Encounter (Signed)
Mona called and advised that medications had been sent to [facility] after Dr. Nicki Reaper spoke with Melody

## 2021-10-08 NOTE — Telephone Encounter (Signed)
I am ok with order for hyoscyamine, morphine and prednisone, but need clarification on her clinical status.  If unresponsive, she will be unable to take oral prednisone.  Tried to call. Left message to call back for clarification

## 2021-10-08 NOTE — Addendum Note (Signed)
Addended by: Charm Barges on: 10/08/2021 04:53 PM   Modules accepted: Orders

## 2021-10-08 NOTE — Telephone Encounter (Signed)
99.1 fever Difficulty swallowing Nebulizer not working Nonproductive cough, but sounds wet Not eating  - 2 days On 3L O2 - Sats are 90% 24 respirations/minute  Heart rate 115 Not responding    Melody asking for : Prednisone  Hyoscyamine 0.125mg  every 4 hrs sublingual PRN Morphine 0.25mg  - 0.5mg  every 2 hrs PRN for S.O.B and pain To be sent to local pharm, - [facility]

## 2021-10-08 NOTE — Telephone Encounter (Signed)
Rx sent to [facility].  Spoke to [facility]. Please notify [facility]

## 2021-10-08 NOTE — Telephone Encounter (Signed)
Lm for Diamond Newman requesting call back to office before 5 for clarification

## 2021-10-08 NOTE — Telephone Encounter (Signed)
I tried to call Diamond Newman back & was unable to leave message due to VM being full.

## 2021-10-08 NOTE — Telephone Encounter (Signed)
Arlyn Dunning, Healthcare nurse, [phone]. Patient is having a heard time breathing. She is requesting medication and comfort care orders from provider.

## 2021-10-08 NOTE — Telephone Encounter (Signed)
RN called back and was routed directly to Dr. Lorin Picket.

## 2021-10-09 DIAGNOSIS — F02811 Dementia in other diseases classified elsewhere, unspecified severity, with agitation: Secondary | ICD-10-CM | POA: Diagnosis not present

## 2021-10-09 DIAGNOSIS — F0283 Dementia in other diseases classified elsewhere, unspecified severity, with mood disturbance: Secondary | ICD-10-CM | POA: Diagnosis not present

## 2021-10-09 DIAGNOSIS — J9691 Respiratory failure, unspecified with hypoxia: Secondary | ICD-10-CM | POA: Diagnosis not present

## 2021-10-09 DIAGNOSIS — G309 Alzheimer's disease, unspecified: Secondary | ICD-10-CM | POA: Diagnosis not present

## 2021-10-09 DIAGNOSIS — F0284 Dementia in other diseases classified elsewhere, unspecified severity, with anxiety: Secondary | ICD-10-CM | POA: Diagnosis not present

## 2021-10-09 DIAGNOSIS — J449 Chronic obstructive pulmonary disease, unspecified: Secondary | ICD-10-CM | POA: Diagnosis not present

## 2021-10-09 NOTE — Telephone Encounter (Signed)
See other phone message. Spoke to [facility].  Prescriptions sent in for morphine, hyoscyamine and prednisone.

## 2021-10-10 DIAGNOSIS — G309 Alzheimer's disease, unspecified: Secondary | ICD-10-CM | POA: Diagnosis not present

## 2021-10-10 DIAGNOSIS — J9691 Respiratory failure, unspecified with hypoxia: Secondary | ICD-10-CM | POA: Diagnosis not present

## 2021-10-10 DIAGNOSIS — J449 Chronic obstructive pulmonary disease, unspecified: Secondary | ICD-10-CM | POA: Diagnosis not present

## 2021-10-10 DIAGNOSIS — F0284 Dementia in other diseases classified elsewhere, unspecified severity, with anxiety: Secondary | ICD-10-CM | POA: Diagnosis not present

## 2021-10-10 DIAGNOSIS — F0283 Dementia in other diseases classified elsewhere, unspecified severity, with mood disturbance: Secondary | ICD-10-CM | POA: Diagnosis not present

## 2021-10-10 DIAGNOSIS — F02811 Dementia in other diseases classified elsewhere, unspecified severity, with agitation: Secondary | ICD-10-CM | POA: Diagnosis not present

## 2021-10-11 DIAGNOSIS — G309 Alzheimer's disease, unspecified: Secondary | ICD-10-CM | POA: Diagnosis not present

## 2021-10-11 DIAGNOSIS — J449 Chronic obstructive pulmonary disease, unspecified: Secondary | ICD-10-CM | POA: Diagnosis not present

## 2021-10-11 DIAGNOSIS — F0283 Dementia in other diseases classified elsewhere, unspecified severity, with mood disturbance: Secondary | ICD-10-CM | POA: Diagnosis not present

## 2021-10-11 DIAGNOSIS — J9691 Respiratory failure, unspecified with hypoxia: Secondary | ICD-10-CM | POA: Diagnosis not present

## 2021-10-11 DIAGNOSIS — F0284 Dementia in other diseases classified elsewhere, unspecified severity, with anxiety: Secondary | ICD-10-CM | POA: Diagnosis not present

## 2021-10-11 DIAGNOSIS — F02811 Dementia in other diseases classified elsewhere, unspecified severity, with agitation: Secondary | ICD-10-CM | POA: Diagnosis not present

## 2021-10-12 DIAGNOSIS — F02811 Dementia in other diseases classified elsewhere, unspecified severity, with agitation: Secondary | ICD-10-CM | POA: Diagnosis not present

## 2021-10-12 DIAGNOSIS — G309 Alzheimer's disease, unspecified: Secondary | ICD-10-CM | POA: Diagnosis not present

## 2021-10-12 DIAGNOSIS — J449 Chronic obstructive pulmonary disease, unspecified: Secondary | ICD-10-CM | POA: Diagnosis not present

## 2021-10-12 DIAGNOSIS — F0284 Dementia in other diseases classified elsewhere, unspecified severity, with anxiety: Secondary | ICD-10-CM | POA: Diagnosis not present

## 2021-10-12 DIAGNOSIS — F0283 Dementia in other diseases classified elsewhere, unspecified severity, with mood disturbance: Secondary | ICD-10-CM | POA: Diagnosis not present

## 2021-10-12 DIAGNOSIS — J9691 Respiratory failure, unspecified with hypoxia: Secondary | ICD-10-CM | POA: Diagnosis not present

## 2021-10-13 DIAGNOSIS — G309 Alzheimer's disease, unspecified: Secondary | ICD-10-CM | POA: Diagnosis not present

## 2021-10-13 DIAGNOSIS — Z743 Need for continuous supervision: Secondary | ICD-10-CM | POA: Diagnosis not present

## 2021-10-13 DIAGNOSIS — J9691 Respiratory failure, unspecified with hypoxia: Secondary | ICD-10-CM | POA: Diagnosis not present

## 2021-10-13 DIAGNOSIS — R52 Pain, unspecified: Secondary | ICD-10-CM | POA: Diagnosis not present

## 2021-10-13 DIAGNOSIS — R5381 Other malaise: Secondary | ICD-10-CM | POA: Diagnosis not present

## 2021-10-13 DIAGNOSIS — F0284 Dementia in other diseases classified elsewhere, unspecified severity, with anxiety: Secondary | ICD-10-CM | POA: Diagnosis not present

## 2021-10-13 DIAGNOSIS — F02811 Dementia in other diseases classified elsewhere, unspecified severity, with agitation: Secondary | ICD-10-CM | POA: Diagnosis not present

## 2021-10-13 DIAGNOSIS — F0283 Dementia in other diseases classified elsewhere, unspecified severity, with mood disturbance: Secondary | ICD-10-CM | POA: Diagnosis not present

## 2021-10-13 DIAGNOSIS — R69 Illness, unspecified: Secondary | ICD-10-CM | POA: Diagnosis not present

## 2021-10-13 DIAGNOSIS — J449 Chronic obstructive pulmonary disease, unspecified: Secondary | ICD-10-CM | POA: Diagnosis not present

## 2021-10-14 DIAGNOSIS — F0283 Dementia in other diseases classified elsewhere, unspecified severity, with mood disturbance: Secondary | ICD-10-CM | POA: Diagnosis not present

## 2021-10-14 DIAGNOSIS — G309 Alzheimer's disease, unspecified: Secondary | ICD-10-CM | POA: Diagnosis not present

## 2021-10-14 DIAGNOSIS — F0284 Dementia in other diseases classified elsewhere, unspecified severity, with anxiety: Secondary | ICD-10-CM | POA: Diagnosis not present

## 2021-10-14 DIAGNOSIS — J9691 Respiratory failure, unspecified with hypoxia: Secondary | ICD-10-CM | POA: Diagnosis not present

## 2021-10-14 DIAGNOSIS — F02811 Dementia in other diseases classified elsewhere, unspecified severity, with agitation: Secondary | ICD-10-CM | POA: Diagnosis not present

## 2021-10-14 DIAGNOSIS — J449 Chronic obstructive pulmonary disease, unspecified: Secondary | ICD-10-CM | POA: Diagnosis not present

## 2021-10-15 DIAGNOSIS — J449 Chronic obstructive pulmonary disease, unspecified: Secondary | ICD-10-CM | POA: Diagnosis not present

## 2021-10-15 DIAGNOSIS — F02811 Dementia in other diseases classified elsewhere, unspecified severity, with agitation: Secondary | ICD-10-CM | POA: Diagnosis not present

## 2021-10-15 DIAGNOSIS — J9691 Respiratory failure, unspecified with hypoxia: Secondary | ICD-10-CM | POA: Diagnosis not present

## 2021-10-15 DIAGNOSIS — F0283 Dementia in other diseases classified elsewhere, unspecified severity, with mood disturbance: Secondary | ICD-10-CM | POA: Diagnosis not present

## 2021-10-15 DIAGNOSIS — G309 Alzheimer's disease, unspecified: Secondary | ICD-10-CM | POA: Diagnosis not present

## 2021-10-15 DIAGNOSIS — F0284 Dementia in other diseases classified elsewhere, unspecified severity, with anxiety: Secondary | ICD-10-CM | POA: Diagnosis not present

## 2021-10-16 DIAGNOSIS — J449 Chronic obstructive pulmonary disease, unspecified: Secondary | ICD-10-CM | POA: Diagnosis not present

## 2021-10-16 DIAGNOSIS — G309 Alzheimer's disease, unspecified: Secondary | ICD-10-CM | POA: Diagnosis not present

## 2021-10-16 DIAGNOSIS — F0283 Dementia in other diseases classified elsewhere, unspecified severity, with mood disturbance: Secondary | ICD-10-CM | POA: Diagnosis not present

## 2021-10-16 DIAGNOSIS — J9691 Respiratory failure, unspecified with hypoxia: Secondary | ICD-10-CM | POA: Diagnosis not present

## 2021-10-16 DIAGNOSIS — F02811 Dementia in other diseases classified elsewhere, unspecified severity, with agitation: Secondary | ICD-10-CM | POA: Diagnosis not present

## 2021-10-16 DIAGNOSIS — F0284 Dementia in other diseases classified elsewhere, unspecified severity, with anxiety: Secondary | ICD-10-CM | POA: Diagnosis not present

## 2021-10-17 DIAGNOSIS — F0284 Dementia in other diseases classified elsewhere, unspecified severity, with anxiety: Secondary | ICD-10-CM | POA: Diagnosis not present

## 2021-10-17 DIAGNOSIS — J449 Chronic obstructive pulmonary disease, unspecified: Secondary | ICD-10-CM | POA: Diagnosis not present

## 2021-10-17 DIAGNOSIS — F0283 Dementia in other diseases classified elsewhere, unspecified severity, with mood disturbance: Secondary | ICD-10-CM | POA: Diagnosis not present

## 2021-10-17 DIAGNOSIS — F02811 Dementia in other diseases classified elsewhere, unspecified severity, with agitation: Secondary | ICD-10-CM | POA: Diagnosis not present

## 2021-10-17 DIAGNOSIS — G309 Alzheimer's disease, unspecified: Secondary | ICD-10-CM | POA: Diagnosis not present

## 2021-10-17 DIAGNOSIS — J9691 Respiratory failure, unspecified with hypoxia: Secondary | ICD-10-CM | POA: Diagnosis not present

## 2021-10-18 DIAGNOSIS — F0283 Dementia in other diseases classified elsewhere, unspecified severity, with mood disturbance: Secondary | ICD-10-CM | POA: Diagnosis not present

## 2021-10-18 DIAGNOSIS — F0284 Dementia in other diseases classified elsewhere, unspecified severity, with anxiety: Secondary | ICD-10-CM | POA: Diagnosis not present

## 2021-10-18 DIAGNOSIS — J9691 Respiratory failure, unspecified with hypoxia: Secondary | ICD-10-CM | POA: Diagnosis not present

## 2021-10-18 DIAGNOSIS — J449 Chronic obstructive pulmonary disease, unspecified: Secondary | ICD-10-CM | POA: Diagnosis not present

## 2021-10-18 DIAGNOSIS — G309 Alzheimer's disease, unspecified: Secondary | ICD-10-CM | POA: Diagnosis not present

## 2021-10-18 DIAGNOSIS — F02811 Dementia in other diseases classified elsewhere, unspecified severity, with agitation: Secondary | ICD-10-CM | POA: Diagnosis not present

## 2021-10-22 DEATH — deceased
